# Patient Record
Sex: Male | Born: 1945
Health system: Southern US, Community
[De-identification: ages and names within clinical notes are randomized; demographics above are authoritative.]

## PROBLEM LIST (undated history)

## (undated) DIAGNOSIS — K635 Polyp of colon: Secondary | ICD-10-CM

## (undated) DIAGNOSIS — E785 Hyperlipidemia, unspecified: Secondary | ICD-10-CM

## (undated) DIAGNOSIS — K5790 Diverticulosis of intestine, part unspecified, without perforation or abscess without bleeding: Secondary | ICD-10-CM

## (undated) DIAGNOSIS — Z77090 Contact with and (suspected) exposure to asbestos: Secondary | ICD-10-CM

## (undated) DIAGNOSIS — N200 Calculus of kidney: Secondary | ICD-10-CM

## (undated) DIAGNOSIS — N400 Enlarged prostate without lower urinary tract symptoms: Secondary | ICD-10-CM

## (undated) DIAGNOSIS — I82409 Acute embolism and thrombosis of unspecified deep veins of unspecified lower extremity: Secondary | ICD-10-CM

## (undated) DIAGNOSIS — K7689 Other specified diseases of liver: Secondary | ICD-10-CM

## (undated) DIAGNOSIS — T7840XA Allergy, unspecified, initial encounter: Secondary | ICD-10-CM

## (undated) DIAGNOSIS — K219 Gastro-esophageal reflux disease without esophagitis: Secondary | ICD-10-CM

## (undated) DIAGNOSIS — J342 Deviated nasal septum: Secondary | ICD-10-CM

## (undated) DIAGNOSIS — K529 Noninfective gastroenteritis and colitis, unspecified: Secondary | ICD-10-CM

## (undated) DIAGNOSIS — C4491 Basal cell carcinoma of skin, unspecified: Secondary | ICD-10-CM

## (undated) DIAGNOSIS — C61 Malignant neoplasm of prostate: Principal | ICD-10-CM

## (undated) DIAGNOSIS — D689 Coagulation defect, unspecified: Secondary | ICD-10-CM

## (undated) DIAGNOSIS — D179 Benign lipomatous neoplasm, unspecified: Secondary | ICD-10-CM

## (undated) DIAGNOSIS — C801 Malignant (primary) neoplasm, unspecified: Secondary | ICD-10-CM

## (undated) DIAGNOSIS — K5190 Ulcerative colitis, unspecified, without complications: Secondary | ICD-10-CM

## (undated) HISTORY — DX: Hyperlipidemia, unspecified: E78.5

## (undated) HISTORY — DX: Other specified diseases of liver: K76.89

## (undated) HISTORY — DX: Gastro-esophageal reflux disease without esophagitis: K21.9

## (undated) HISTORY — DX: Malignant neoplasm of prostate: C61

## (undated) HISTORY — DX: Diverticulosis of intestine, part unspecified, without perforation or abscess without bleeding: K57.90

## (undated) HISTORY — PX: APPENDECTOMY: SHX54

## (undated) HISTORY — DX: Benign lipomatous neoplasm, unspecified: D17.9

## (undated) HISTORY — DX: Allergy, unspecified, initial encounter: T78.40XA

## (undated) HISTORY — DX: Polyp of colon: K63.5

## (undated) HISTORY — DX: Noninfective gastroenteritis and colitis, unspecified: K52.9

## (undated) HISTORY — DX: Benign prostatic hyperplasia without lower urinary tract symptoms: N40.0

## (undated) HISTORY — DX: Calculus of kidney: N20.0

## (undated) HISTORY — PX: COLONOSCOPY: SHX174

## (undated) HISTORY — DX: Ulcerative colitis, unspecified, without complications: K51.90

## (undated) HISTORY — DX: Coagulation defect, unspecified: D68.9

## (undated) HISTORY — DX: Malignant (primary) neoplasm, unspecified: C80.1

## (undated) HISTORY — DX: Basal cell carcinoma of skin, unspecified: C44.91

## (undated) HISTORY — PX: TONSILLECTOMY: SUR1361

## (undated) HISTORY — DX: Contact with and (suspected) exposure to asbestos: Z77.090

## (undated) HISTORY — PX: POLYPECTOMY: SHX149

---

## 1989-01-31 HISTORY — PX: OTHER SURGICAL HISTORY: SHX169

## 1989-01-31 HISTORY — PX: INGUINAL HERNIA REPAIR: SUR1180

## 1999-12-01 DIAGNOSIS — M25561 Pain in right knee: Secondary | ICD-10-CM | POA: Insufficient documentation

## 2002-02-16 ENCOUNTER — Encounter: Payer: Self-pay | Admitting: Internal Medicine

## 2004-04-04 ENCOUNTER — Ambulatory Visit: Payer: Self-pay | Admitting: Internal Medicine

## 2004-04-15 ENCOUNTER — Encounter: Payer: Self-pay | Admitting: Internal Medicine

## 2004-04-15 ENCOUNTER — Ambulatory Visit: Payer: Self-pay | Admitting: Internal Medicine

## 2005-06-02 HISTORY — PX: KNEE SURGERY: SHX244

## 2006-03-05 ENCOUNTER — Ambulatory Visit: Payer: Self-pay | Admitting: Internal Medicine

## 2006-03-18 ENCOUNTER — Ambulatory Visit: Payer: Self-pay | Admitting: Internal Medicine

## 2006-05-05 ENCOUNTER — Ambulatory Visit: Payer: Self-pay | Admitting: Internal Medicine

## 2007-03-08 DIAGNOSIS — N400 Enlarged prostate without lower urinary tract symptoms: Secondary | ICD-10-CM | POA: Insufficient documentation

## 2007-03-08 DIAGNOSIS — E782 Mixed hyperlipidemia: Secondary | ICD-10-CM | POA: Insufficient documentation

## 2007-03-08 DIAGNOSIS — E785 Hyperlipidemia, unspecified: Secondary | ICD-10-CM | POA: Insufficient documentation

## 2007-03-30 ENCOUNTER — Ambulatory Visit: Payer: Self-pay | Admitting: Internal Medicine

## 2007-04-08 ENCOUNTER — Ambulatory Visit: Payer: Self-pay | Admitting: Internal Medicine

## 2007-04-09 ENCOUNTER — Ambulatory Visit: Payer: Self-pay | Admitting: Internal Medicine

## 2007-04-09 ENCOUNTER — Encounter: Payer: Self-pay | Admitting: Internal Medicine

## 2007-08-09 ENCOUNTER — Telehealth: Payer: Self-pay | Admitting: Internal Medicine

## 2007-08-23 ENCOUNTER — Ambulatory Visit: Payer: Self-pay | Admitting: Internal Medicine

## 2007-09-27 ENCOUNTER — Ambulatory Visit: Payer: Self-pay | Admitting: Internal Medicine

## 2007-09-27 LAB — CONVERTED CEMR LAB
ALT: 28 units/L (ref 0–53)
AST: 26 units/L (ref 0–37)
Albumin: 4.1 g/dL (ref 3.5–5.2)
Alkaline Phosphatase: 57 units/L (ref 39–117)
BUN: 16 mg/dL (ref 6–23)
Basophils Absolute: 0 10*3/uL (ref 0.0–0.1)
Basophils Relative: 0.1 % (ref 0.0–1.0)
Bilirubin Urine: NEGATIVE
Bilirubin, Direct: 0.1 mg/dL (ref 0.0–0.3)
Blood in Urine, dipstick: NEGATIVE
CO2: 28 meq/L (ref 19–32)
Calcium: 9.3 mg/dL (ref 8.4–10.5)
Chloride: 110 meq/L (ref 96–112)
Cholesterol: 182 mg/dL (ref 0–200)
Creatinine, Ser: 1 mg/dL (ref 0.4–1.5)
Eosinophils Absolute: 0.6 10*3/uL (ref 0.0–0.7)
Eosinophils Relative: 12.9 % — ABNORMAL HIGH (ref 0.0–5.0)
GFR calc Af Amer: 98 mL/min
GFR calc non Af Amer: 81 mL/min
Glucose, Bld: 90 mg/dL (ref 70–99)
Glucose, Urine, Semiquant: NEGATIVE
HCT: 45.1 % (ref 39.0–52.0)
HDL: 30.7 mg/dL — ABNORMAL LOW (ref >39.0)
Hemoglobin: 15 g/dL (ref 13.0–17.0)
Ketones, urine, test strip: NEGATIVE
LDL Cholesterol: 135 mg/dL — ABNORMAL HIGH (ref 0–99)
Lymphocytes Relative: 22.7 % (ref 12.0–46.0)
MCHC: 33.3 g/dL (ref 30.0–36.0)
MCV: 87.4 fL (ref 78.0–100.0)
Monocytes Absolute: 0.4 10*3/uL (ref 0.1–1.0)
Monocytes Relative: 8.1 % (ref 3.0–12.0)
Neutro Abs: 2.6 10*3/uL (ref 1.4–7.7)
Neutrophils Relative %: 56.2 % (ref 43.0–77.0)
Nitrite: NEGATIVE
PSA: 3.04 ng/mL (ref 0.10–4.00)
Platelets: 290 10*3/uL (ref 150–400)
Potassium: 4.3 meq/L (ref 3.5–5.1)
RBC: 5.17 M/uL (ref 4.22–5.81)
RDW: 13.2 % (ref 11.5–14.6)
Sodium: 143 meq/L (ref 135–145)
Specific Gravity, Urine: >=1.03
TSH: 1.97 microintl units/mL (ref 0.35–5.50)
Total Bilirubin: 1.6 mg/dL — ABNORMAL HIGH (ref 0.3–1.2)
Total CHOL/HDL Ratio: 5.9
Total Protein: 6.4 g/dL (ref 6.0–8.3)
Triglycerides: 83 mg/dL (ref 0–149)
Urobilinogen, UA: 0.2
VLDL: 17 mg/dL (ref 0–40)
WBC Urine, dipstick: NEGATIVE
WBC: 4.6 10*3/uL (ref 4.5–10.5)
pH: 5.5

## 2007-10-04 ENCOUNTER — Ambulatory Visit: Payer: Self-pay | Admitting: Internal Medicine

## 2007-10-04 DIAGNOSIS — K219 Gastro-esophageal reflux disease without esophagitis: Secondary | ICD-10-CM | POA: Insufficient documentation

## 2007-10-04 DIAGNOSIS — J309 Allergic rhinitis, unspecified: Secondary | ICD-10-CM

## 2007-10-06 ENCOUNTER — Ambulatory Visit: Payer: Self-pay | Admitting: Internal Medicine

## 2007-10-15 ENCOUNTER — Encounter: Admission: RE | Admit: 2007-10-15 | Discharge: 2007-10-15 | Payer: Self-pay | Admitting: Internal Medicine

## 2007-11-08 ENCOUNTER — Telehealth: Payer: Self-pay | Admitting: Internal Medicine

## 2007-11-23 ENCOUNTER — Ambulatory Visit: Payer: Self-pay | Admitting: Internal Medicine

## 2008-01-17 ENCOUNTER — Telehealth (INDEPENDENT_AMBULATORY_CARE_PROVIDER_SITE_OTHER): Payer: Self-pay | Admitting: *Deleted

## 2008-01-21 ENCOUNTER — Ambulatory Visit: Payer: Self-pay | Admitting: Internal Medicine

## 2008-01-21 LAB — CONVERTED CEMR LAB
BUN: 16 mg/dL (ref 6–23)
Creatinine, Ser: 1 mg/dL (ref 0.4–1.5)

## 2008-01-24 ENCOUNTER — Ambulatory Visit: Payer: Self-pay | Admitting: Cardiovascular Disease

## 2008-03-15 ENCOUNTER — Ambulatory Visit: Payer: Self-pay | Admitting: Internal Medicine

## 2008-03-25 ENCOUNTER — Ambulatory Visit: Payer: Self-pay | Admitting: Family Medicine

## 2008-03-25 LAB — CONVERTED CEMR LAB
Bilirubin Urine: NEGATIVE
Blood in Urine, dipstick: NEGATIVE
Glucose, Urine, Semiquant: NEGATIVE
Ketones, urine, test strip: NEGATIVE
Nitrite: NEGATIVE
Protein, U semiquant: NEGATIVE
Specific Gravity, Urine: 1.01
Urobilinogen, UA: 0.2
WBC Urine, dipstick: NEGATIVE
pH: 6.5

## 2008-10-19 ENCOUNTER — Telehealth: Payer: Self-pay | Admitting: Internal Medicine

## 2009-01-22 ENCOUNTER — Telehealth: Payer: Self-pay | Admitting: Internal Medicine

## 2009-02-15 ENCOUNTER — Ambulatory Visit: Payer: Self-pay | Admitting: Internal Medicine

## 2009-02-15 LAB — CONVERTED CEMR LAB
Bilirubin Urine: NEGATIVE
Blood in Urine, dipstick: NEGATIVE
Glucose, Urine, Semiquant: NEGATIVE
Ketones, urine, test strip: NEGATIVE
Nitrite: NEGATIVE
Protein, U semiquant: NEGATIVE
Specific Gravity, Urine: 1.025
Urobilinogen, UA: 0.2
WBC Urine, dipstick: NEGATIVE
pH: 5.5

## 2009-02-18 LAB — CONVERTED CEMR LAB
ALT: 28 units/L (ref 0–53)
AST: 21 units/L (ref 0–37)
Albumin: 4.1 g/dL (ref 3.5–5.2)
Alkaline Phosphatase: 63 units/L (ref 39–117)
BUN: 16 mg/dL (ref 6–23)
Basophils Absolute: 0 10*3/uL (ref 0.0–0.1)
Basophils Relative: 0.5 % (ref 0.0–3.0)
Bilirubin, Direct: 0.2 mg/dL (ref 0.0–0.3)
CO2: 27 meq/L (ref 19–32)
Calcium: 8.9 mg/dL (ref 8.4–10.5)
Chloride: 107 meq/L (ref 96–112)
Cholesterol: 186 mg/dL (ref 0–200)
Creatinine, Ser: 1 mg/dL (ref 0.4–1.5)
Eosinophils Absolute: 0.6 10*3/uL (ref 0.0–0.7)
Eosinophils Relative: 11.5 % — ABNORMAL HIGH (ref 0.0–5.0)
GFR calc non Af Amer: 80.57 mL/min (ref >60)
Glucose, Bld: 111 mg/dL — ABNORMAL HIGH (ref 70–99)
HCT: 44.2 % (ref 39.0–52.0)
HDL: 34.7 mg/dL — ABNORMAL LOW (ref >39.00)
Hemoglobin: 15.3 g/dL (ref 13.0–17.0)
LDL Cholesterol: 135 mg/dL — ABNORMAL HIGH (ref 0–99)
Lymphocytes Relative: 24.4 % (ref 12.0–46.0)
Lymphs Abs: 1.2 10*3/uL (ref 0.7–4.0)
MCHC: 34.6 g/dL (ref 30.0–36.0)
MCV: 88.8 fL (ref 78.0–100.0)
Monocytes Absolute: 0.4 10*3/uL (ref 0.1–1.0)
Monocytes Relative: 8.4 % (ref 3.0–12.0)
Neutro Abs: 2.9 10*3/uL (ref 1.4–7.7)
Neutrophils Relative %: 55.2 % (ref 43.0–77.0)
PSA: 4.22 ng/mL — ABNORMAL HIGH (ref 0.10–4.00)
Platelets: 236 10*3/uL (ref 150.0–400.0)
Potassium: 4.2 meq/L (ref 3.5–5.1)
RBC: 4.98 M/uL (ref 4.22–5.81)
RDW: 12.9 % (ref 11.5–14.6)
Sodium: 140 meq/L (ref 135–145)
TSH: 2.02 microintl units/mL (ref 0.35–5.50)
Total Bilirubin: 1.8 mg/dL — ABNORMAL HIGH (ref 0.3–1.2)
Total CHOL/HDL Ratio: 5
Total Protein: 6.5 g/dL (ref 6.0–8.3)
Triglycerides: 80 mg/dL (ref 0.0–149.0)
VLDL: 16 mg/dL (ref 0.0–40.0)
WBC: 5.1 10*3/uL (ref 4.5–10.5)

## 2009-02-22 ENCOUNTER — Ambulatory Visit: Payer: Self-pay | Admitting: Internal Medicine

## 2009-04-02 ENCOUNTER — Ambulatory Visit: Payer: Self-pay | Admitting: Internal Medicine

## 2009-04-02 LAB — CONVERTED CEMR LAB
Basophils Absolute: 0 10*3/uL (ref 0.0–0.1)
Basophils Relative: 0.7 % (ref 0.0–3.0)
Eosinophils Absolute: 0.7 10*3/uL (ref 0.0–0.7)
Eosinophils Relative: 13.2 % — ABNORMAL HIGH (ref 0.0–5.0)
HCT: 43.8 % (ref 39.0–52.0)
Hemoglobin: 15.3 g/dL (ref 13.0–17.0)
Lymphocytes Relative: 23.9 % (ref 12.0–46.0)
Lymphs Abs: 1.2 10*3/uL (ref 0.7–4.0)
MCHC: 35 g/dL (ref 30.0–36.0)
MCV: 91.4 fL (ref 78.0–100.0)
Monocytes Absolute: 0.4 10*3/uL (ref 0.1–1.0)
Monocytes Relative: 7.9 % (ref 3.0–12.0)
Neutro Abs: 2.9 10*3/uL (ref 1.4–7.7)
Neutrophils Relative %: 54.3 % (ref 43.0–77.0)
PSA, Free Pct: 12 — ABNORMAL LOW (ref >25)
PSA, Free: 0.4 ng/mL
PSA: 3.41 ng/mL (ref 0.10–4.00)
Platelets: 237 10*3/uL (ref 150.0–400.0)
RBC: 4.79 M/uL (ref 4.22–5.81)
RDW: 12.7 % (ref 11.5–14.6)
WBC: 5.2 10*3/uL (ref 4.5–10.5)

## 2009-11-28 ENCOUNTER — Telehealth: Payer: Self-pay | Admitting: Internal Medicine

## 2010-02-18 ENCOUNTER — Ambulatory Visit: Payer: Self-pay | Admitting: Internal Medicine

## 2010-02-18 LAB — CONVERTED CEMR LAB
ALT: 28 units/L (ref 0–53)
AST: 22 units/L (ref 0–37)
Albumin: 4 g/dL (ref 3.5–5.2)
Alkaline Phosphatase: 58 units/L (ref 39–117)
BUN: 16 mg/dL (ref 6–23)
Basophils Absolute: 0 10*3/uL (ref 0.0–0.1)
Basophils Relative: 0.6 % (ref 0.0–3.0)
Bilirubin Urine: NEGATIVE
Bilirubin, Direct: 0.1 mg/dL (ref 0.0–0.3)
CO2: 25 meq/L (ref 19–32)
Calcium: 9 mg/dL (ref 8.4–10.5)
Cholesterol: 195 mg/dL (ref 0–200)
Creatinine, Ser: 0.9 mg/dL (ref 0.4–1.5)
Eosinophils Absolute: 0.6 10*3/uL (ref 0.0–0.7)
Eosinophils Relative: 10.7 % — ABNORMAL HIGH (ref 0.0–5.0)
Glucose, Bld: 100 mg/dL — ABNORMAL HIGH (ref 70–99)
Glucose, Urine, Semiquant: NEGATIVE
HCT: 44.1 % (ref 39.0–52.0)
HDL: 38.3 mg/dL — ABNORMAL LOW (ref >39.00)
Hemoglobin: 15.2 g/dL (ref 13.0–17.0)
Ketones, urine, test strip: NEGATIVE
LDL Cholesterol: 140 mg/dL — ABNORMAL HIGH (ref 0–99)
Lymphocytes Relative: 26 % (ref 12.0–46.0)
Lymphs Abs: 1.3 10*3/uL (ref 0.7–4.0)
MCHC: 34.5 g/dL (ref 30.0–36.0)
MCV: 90.7 fL (ref 78.0–100.0)
Monocytes Absolute: 0.5 10*3/uL (ref 0.1–1.0)
Monocytes Relative: 9.3 % (ref 3.0–12.0)
Neutro Abs: 2.8 10*3/uL (ref 1.4–7.7)
Neutrophils Relative %: 53.4 % (ref 43.0–77.0)
Nitrite: NEGATIVE
PSA: 4.16 ng/mL — ABNORMAL HIGH (ref 0.10–4.00)
Platelets: 257 10*3/uL (ref 150.0–400.0)
Potassium: 4.2 meq/L (ref 3.5–5.1)
Protein, U semiquant: NEGATIVE
RBC: 4.86 M/uL (ref 4.22–5.81)
RDW: 13.3 % (ref 11.5–14.6)
Sodium: 139 meq/L (ref 135–145)
Specific Gravity, Urine: 1.02
TSH: 1.87 microintl units/mL (ref 0.35–5.50)
Total Bilirubin: 1.2 mg/dL (ref 0.3–1.2)
Total CHOL/HDL Ratio: 5
Total Protein: 6 g/dL (ref 6.0–8.3)
Triglycerides: 85 mg/dL (ref 0.0–149.0)
Urobilinogen, UA: 0.2
WBC: 5.2 10*3/uL (ref 4.5–10.5)

## 2010-02-25 ENCOUNTER — Encounter: Payer: Self-pay | Admitting: Internal Medicine

## 2010-02-25 ENCOUNTER — Ambulatory Visit: Payer: Self-pay | Admitting: Internal Medicine

## 2010-03-02 DIAGNOSIS — K7689 Other specified diseases of liver: Secondary | ICD-10-CM

## 2010-03-02 HISTORY — DX: Other specified diseases of liver: K76.89

## 2010-03-11 ENCOUNTER — Ambulatory Visit: Payer: Self-pay | Admitting: Radiology

## 2010-03-11 ENCOUNTER — Emergency Department (HOSPITAL_BASED_OUTPATIENT_CLINIC_OR_DEPARTMENT_OTHER): Admission: EM | Admit: 2010-03-11 | Discharge: 2010-03-11 | Payer: Self-pay | Admitting: Emergency Medicine

## 2010-03-18 ENCOUNTER — Telehealth: Payer: Self-pay | Admitting: Internal Medicine

## 2010-03-19 ENCOUNTER — Encounter: Payer: Self-pay | Admitting: Internal Medicine

## 2010-03-21 ENCOUNTER — Ambulatory Visit: Payer: Self-pay

## 2010-03-21 ENCOUNTER — Telehealth (INDEPENDENT_AMBULATORY_CARE_PROVIDER_SITE_OTHER): Payer: Self-pay | Admitting: *Deleted

## 2010-03-21 ENCOUNTER — Ambulatory Visit: Payer: Self-pay | Admitting: Family Medicine

## 2010-03-21 DIAGNOSIS — M722 Plantar fascial fibromatosis: Secondary | ICD-10-CM

## 2010-03-21 DIAGNOSIS — M79609 Pain in unspecified limb: Secondary | ICD-10-CM

## 2010-03-25 ENCOUNTER — Encounter: Payer: Self-pay | Admitting: Internal Medicine

## 2010-04-04 ENCOUNTER — Encounter: Payer: Self-pay | Admitting: Internal Medicine

## 2010-05-09 ENCOUNTER — Encounter: Payer: Self-pay | Admitting: Internal Medicine

## 2010-05-20 ENCOUNTER — Ambulatory Visit: Payer: Self-pay | Admitting: Internal Medicine

## 2010-06-02 DIAGNOSIS — N200 Calculus of kidney: Secondary | ICD-10-CM | POA: Insufficient documentation

## 2010-06-12 ENCOUNTER — Ambulatory Visit
Admission: RE | Admit: 2010-06-12 | Discharge: 2010-06-12 | Payer: Self-pay | Source: Home / Self Care | Attending: Internal Medicine | Admitting: Internal Medicine

## 2010-06-25 ENCOUNTER — Telehealth: Payer: Self-pay | Admitting: Internal Medicine

## 2010-07-02 NOTE — Letter (Signed)
Summary: [facility]  [facility]   Imported By: Maryln Gottron 04/01/2010 11:18:35  _____________________________________________________________________  External Attachment:    Type:   Image     Comment:   External Document

## 2010-07-02 NOTE — Assessment & Plan Note (Signed)
Summary: r/o DVT/dm   Vital Signs:  Patient profile:   65 year old male Height:      71 inches (180.34 cm) Weight:      243 pounds (110.45 kg) O2 Sat:      97 % on Room air Temp:     97.8 degrees F (36.56 degrees C) oral Pulse rate:   84 / minute BP sitting:   126 / 76  (left arm) Cuff size:   large  Vitals Entered By: [name] RMA (March 21, 2010 10:24 AM)  O2 Flow:  Room air CC: Feet swelling X10-12 days- a lillte swelling in both feet but left is worse/ pain in left leg x10-12 days/ CF Is Patient Diabetic? No   CC:  Feet swelling X10-12 days- a lillte swelling in both feet but left is worse/ pain in left leg x10-12 days/ CF.  History of Present Illness: Patient is a 65 yo male, nonsmoker who is in today with concerns of pain, he is experiencing edema and pain in left leg. He denies any redness or swelling in that leg. The pain in the leg is only evident with ambulation, pain free at rest. He also experiencing left heal pain as well. He notes this pain is also not present at rest only while ambulating. Denies any injury or fall. Has been exceedingly sedentary the past several weeks because he has been in increased abdominal pain secondary to a kidney stone he has been having trouble passing. He denies f/c/malasie/CP/palp/SOB/GI c/o. Notes the LLE swelling started about the same time he stopped moving. shortly there after his heel started to hurt and then his posterior knee and posterior upper calf  Preventive Screening-Counseling & Management  Alcohol-Tobacco     Smoking Status: never  Current Medications (verified): 1)  Doxazosin Mesylate 4 Mg  Tabs (Doxazosin Mesylate) .... 1/2/ Daily 2)  Icaps Mv   Tabs (Multiple Vitamins-Minerals) .... Once Daily 3)  Omeprazole 20 Mg Cpdr (Omeprazole) .... One By Mouth Every Other Day 4)  Dulcolax .... As Needed 5)  Tamsulosin Hcl 0.4 Mg Caps (Tamsulosin Hcl) .... Once Daily 6)  Oxycodone-Acetaminophen 10-650 Mg Tabs  (Oxycodone-Acetaminophen) .... 1/2-1 1/2 Q 6hrs As Needed  Allergies (verified): No Known Drug Allergies  Past History:  Past medical history reviewed for relevance to current acute and chronic problems. Social history (including risk factors) reviewed for relevance to current acute and chronic problems.  Past Medical History: Reviewed history from 10/04/2007 and no changes required. Hyperlipidemia Benign prostatic hypertrophy Remote asbestos exposure (1980) Allergic rhinitis GERD  Social History: Reviewed history from 10/04/2007 and no changes required. 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 No apparent alopecia or balding. Mouth:  Oral mucosa and oropharynx without lesions or exudates.  Teeth in good repair. Neck:  No deformities, masses, or tenderness noted. Lungs:  Normal respiratory effort, chest expands symmetrically. Lungs are clear to auscultation, no crackles or wheezes. Heart:  Normal rate and regular rhythm. S1 and S2 normal without gallop, murmur, click, rub or other extra sounds. Abdomen:  Bowel sounds positive,abdomen soft and non-tender without masses, organomegaly or hernias noted. Msk:  slight tightness behind left knee, no warmth or redness, no joint line tenderness or ligamentous laxity over the knee. , negative Homan's sign b/l . No  calf tenderness or swelling. Extremities:  No clubbing, cyanosis, edema, or deformity noted   Psych:  Cognition and judgment appear intact. Alert and cooperative with normal attention span and concentration. No apparent delusions, illusions, hallucinations   Impression & Recommendations:  Problem # 1:  CALF PAIN, LEFT (ICD-729.5)  Orders: Doppler Referral  (Doppler) Try daily stretching, Meloxicam and heat if no improvement notify office  Problem # 2:  PLANTAR FASCIITIS, LEFT (ICD-728.71)  His updated medication list for this problem includes:    Meloxicam 7.5 Mg Tabs (Meloxicam) .Marland Kitchen... 1 tab by mouth q am as needed pain with food, would take one daily for 2 weeks and then as needed Daily stretching, ice the heel and apply Aspercreme as needed, for the edema, elevate feet, avoid sodium and consider compression hose  Complete Medication List: 1)  Doxazosin Mesylate 4 Mg Tabs (Doxazosin mesylate) .... 1/2/ daily 2)  Icaps Mv Tabs (Multiple vitamins-minerals) .... Once daily 3)  Omeprazole 20 Mg Cpdr (Omeprazole) .... One by mouth every other day 4)  Dulcolax  .... As needed 5)  Tamsulosin Hcl 0.4 Mg Caps (Tamsulosin hcl) .... Once daily 6)  Oxycodone-acetaminophen 10-650 Mg Tabs (Oxycodone-acetaminophen) .... 1/2-1 1/2 q 6hrs as needed 7)  Meloxicam 7.5 Mg Tabs (Meloxicam) .Marland Kitchen.. 1 tab by mouth q am as needed pain with food, would take one daily for 2 weeks and then as needed  Patient Instructions: 1)  You may move around but avoid painful motions. Apply ice to sore area for 20 minutes 3-4 times a day for 2-3 days.  2)  Please schedule a follow-up appointment as needed if symptoms worsen. 3)  try elevating the feet above the heart two times a day x 15 minutes. 4)  Limit your Sodium(salt) .  5)  increase activity once we have ruled out a blood clot. 6)  Consider Compression stockings to keep down swelling if it is recurrent. For the heel pain, try stretching daily as directed and ice the heel two times a day x 15 minutes then apply Aspercreme topically Prescriptions: MELOXICAM 7.5 MG TABS (MELOXICAM) 1 tab by mouth q am as needed pain with food, would take one daily for 2 weeks and then as needed  #30 x 1   Entered and Authorized by:   Danise Edge MD   Signed by:   Danise Edge MD on 03/21/2010   Method used:   Electronically to        [facility]  [phone]* (retail)       [address]       [place], [place]  [address]       Ph: [phone] or [phone]       Fax: [phone]   RxID:   [id number]    Orders Added: 1)  Doppler Referral [Doppler] 2)  Est. Patient Level III [38756]

## 2010-07-02 NOTE — Miscellaneous (Signed)
Summary: Orders Update  Clinical Lists Changes  Orders: Added new Test order of Venous Duplex Lower Extremity (Venous Duplex Lower) - Signed 

## 2010-07-02 NOTE — Progress Notes (Signed)
Summary: [facility] called. Pt neg for DVT  Phone Note From Other Clinic   Caller: [facility]  Summary of Call: Pt is negative for DVT.  Just wanted to make Dr. Rogelia Rohrer aware.  Initial call taken by: Lucy Antigua,  March 21, 2010 1:27 PM  Follow-up for Phone Call        notify neg Follow-up by: Danise Edge MD,  March 21, 2010 7:16 PM  Additional Follow-up for Phone Call Additional follow up Details #1::        Left message with spouse to have pt return my call. Additional Follow-up by: [name] RMA,  March 22, 2010 9:13 AM     Appended Document: [facility] called. Pt neg for DVT patient informed.

## 2010-07-02 NOTE — Letter (Signed)
Summary: [facility]  [facility]   Imported By: Maryln Gottron 04/12/2010 14:51:06  _____________________________________________________________________  External Attachment:    Type:   Image     Comment:   External Document

## 2010-07-02 NOTE — Assessment & Plan Note (Signed)
Summary: cpx/cjr   Vital Signs:  Patient profile:   65 year old male Height:      71 inches Weight:      240 pounds BMI:     33.59 Temp:     97.3 degrees F oral Pulse rate:   78 / minute Pulse rhythm:   regular Resp:     12 per minute BP sitting:   110 / 80  Vitals Entered By: Lynann Beaver CMA (February 25, 2010 10:03 AM) CC: cpx Is Patient Diabetic? No Pain Assessment Patient in pain? no        CC:  cpx.  History of Present Illness: cpx  Current Problems (verified): 1)  Gerd  (ICD-530.81) 2)  Allergic Rhinitis  (ICD-477.9) 3)  Physical Examination  (ICD-V70.0) 4)  Benign Prostatic Hypertrophy  (ICD-600.00) 5)  Family History of Colon Ca 1st Degree Relative <60  (ICD-V16.0) 6)  Hyperlipidemia  (ICD-272.4)  Current Medications (verified): 1)  Doxazosin Mesylate 4 Mg  Tabs (Doxazosin Mesylate) .... 1/2/ Daily 2)  Icaps Mv   Tabs (Multiple Vitamins-Minerals) .... Once Daily 3)  Omeprazole 20 Mg Cpdr (Omeprazole) .... 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  alert and well-developed.   Head:  normocephalic and atraumatic.  normocephalic and atraumatic.   Eyes:  pupils equal and pupils round.  pupils equal and pupils round.   Ears:  R ear normal  and L ear normal.  R ear normal and L ear normal.   Neck:  No deformities, masses, or tenderness noted. Chest Wall:  No deformities, masses, tenderness or gynecomastia noted. Lungs:  Normal respiratory effort, chest expands symmetrically. Lungs are clear to auscultation, no crackles or wheezes. Heart:  normal rate and regular rhythm.  normal rate and regular rhythm.   Abdomen:  soft and non-tender.  soft and non-tender.   Prostate:  no nodules and no asymmetry.  no nodules and no asymmetry.   Msk:  No deformity or scoliosis noted of thoracic or lumbar spine.   Neurologic:  cranial nerves II-XII intact and gait normal.  cranial nerves II-XII intact and gait normal.   Skin:  turgor normal and color normal.  turgor normal and color normal.   Psych:  normally interactive and good eye contact.  normally interactive and good eye contact.     Impression & Recommendations:  Problem # 1:  PHYSICAL EXAMINATION (ICD-V70.0)  health maint UTD advised weight loss low calorie diet and daily exercise  Orders: EKG w/ Interpretation (93000)  Problem # 2:  HYPERLIPIDEMIA (ICD-272.4) reasonable control---will do better with exercise and weight loss  Problem # 3:  BENIGN PROSTATIC HYPERTROPHY (ICD-600.00) note PSA will monitor in 3 months His updated medication list for this problem includes:    Doxazosin Mesylate 4 Mg Tabs (Doxazosin mesylate) .Marland Kitchen... 1/2/ daily  Complete Medication List: 1)  Doxazosin Mesylate 4 Mg Tabs (Doxazosin mesylate) .... 1/2/ daily 2)  Icaps Mv Tabs (Multiple vitamins-minerals) .Marland KitchenMarland KitchenMarland Kitchen  Once daily 3)  Omeprazole 20 Mg Cpdr (Omeprazole) .... One by mouth daily  Patient Instructions: 1)  3 months---labs only 2)  PSA with free PSA

## 2010-07-02 NOTE — Progress Notes (Signed)
Summary: Pt req to have psa lvl re-checked  Phone Note Call from Patient Call back at [facility] Phone [phone]   Caller: Patient Summary of Call: Pt called and is req to have psa lvl recheck. Would like to order labs.      Initial call taken by: Lucy Antigua,  November 28, 2009 11:53 AM  Follow-up for Phone Call        appears he will be due in september Follow-up by: Birdie Sons MD,  December 01, 2009 6:29 PM  Additional Follow-up for Phone Call Additional follow up Details #1::        I called pt and notified him that he is sch to come in for cpx labs on 02/18/10 and cpx on 02/25/10, as noted above. Pt said that he will just wait until then.  Additional Follow-up by: Lucy Antigua,  December 05, 2009 10:24 AM

## 2010-07-02 NOTE — Assessment & Plan Note (Signed)
Summary: reck med/jls   Vital Signs:  Patient Profile:   65 Years Old Male Weight:      236 pounds Temp:     97.9 degrees F Pulse rate:   66 / minute BP sitting:   122 / 90  (left arm)  Vitals Entered By: Gladis Riffle, RN (August 23, 2007 3:49 PM)                 Chief Complaint:  wants to discuss flomax and would like cheaper alternative--also states needs to schedule cpx.  History of Present Illness: flomax costs too much---works well    Current Allergies (reviewed today): No known allergies         Impression & Recommendations:  Problem # 1:  BENIGN PROSTATIC HYPERTROPHY (ICD-600.00) flomax works well--nocturia from 5 times nightly to 1-2  too expensive  20 minutes total time discussed at length.  Complete Medication List: 1)  Doxazosin Mesylate 4 Mg Tabs (Doxazosin mesylate) .Marland Kitchen.. 1 by mouth daily 2)  Adult Aspirin Low Strength 81 Mg Tbdp (Aspirin) .... Once daily 3)  Cvs Niacin 100 Mg Tabs (Niacin) .... Once daily 4)  Multivitamins Tabs (Multiple vitamin) .... Once daily 5)  Vitamin C 500 Mg Tabs (Ascorbic acid) .... Once daily 6)  Zinc 15 66 Mg Tabs (Zinc sulfate) .... Once daily 7)  Chromium 100 Mcg Tabs (Chromium) .... Once daily 8)  Icaps Mv Tabs (Multiple vitamins-minerals) .... Once daily 9)  Saw Palmetto 160 Mg Caps (Saw palmetto (serenoa repens)) .... Once daily 10)  Calcium 600 1500 Mg Tabs (Calcium carbonate) .... Once daily 11)  Glucosamine-chondroitin 1500-1200 Mg/9ml Liqd (Glucosamine-chondroitin) .... Once daily   Patient Instructions: 1)  Schedule CPX 2)  doxazosin 1/2 tablet nightly for 1 week and then 1 tablet nightly    Prescriptions: DOXAZOSIN MESYLATE 4 MG  TABS (DOXAZOSIN MESYLATE) 1 by mouth daily  #90 x 3   Entered and Authorized by:   Birdie Sons MD   Signed by:   Birdie Sons MD on 08/23/2007   Method used:   Electronically sent to ...       [facility]  [address]  [phone]*       [address]       [place], [place]  [address]       Ph: [phone] or [phone]       Fax: [phone]   RxID:   [id number]  ]

## 2010-07-02 NOTE — Progress Notes (Signed)
  Phone Note Call from Patient Call back at Home Phone [phone]   Caller: Patient Call For: Birdie Sons MD Summary of Call: Diagnosed last Monday with kidney stones, and is having bilateral feet swelling, Takes Flomax, Percocet and Ibuprofen???  any suggestions?  Initial call taken by: Lynann Beaver CMA,  March 18, 2010 10:53 AM  Follow-up for Phone Call        stop ibuprofen. see me for feet swelling if sxs persist Follow-up by: Birdie Sons MD,  March 18, 2010 2:35 PM  Additional Follow-up for Phone Call Additional follow up Details #1::        Phone Call Completed Additional Follow-up by: Rudy Jew, RN,  March 18, 2010 2:44 PM     Appended Document:  Pt is better today and does not need appt right now.

## 2010-07-02 NOTE — Assessment & Plan Note (Signed)
Summary: POSS BLADDER INF/LD   Vital Signs:  Patient Profile:   65 Years Old Male Height:     72 inches Weight:      238.75 pounds Temp:     97.2 degrees F oral Pulse rate:   72 / minute BP sitting:   118 / 76  (left arm) Cuff size:   large  Vitals Entered By: Shonna Chock (March 25, 2008 11:12 AM)                 Chief Complaint:  POSSIBLE BLADDER INFECTION.  History of Present Illness: 65 yo man who developed dysuria and frequency last night.  Awoke 4x last night to use the bathroom- usually only 2x/night.  Has had 2-3 UTIs in last 8 years.  Denies hematuria.  This AM has no sxs.  Denies suprapubic pain, hesitancy, urgency, incontinence.  Concerned b/c he leaves for vacation tomorrow.    Current Allergies (reviewed today): No known allergies      Review of Systems      See HPI   Physical Exam  General:     Well-developed,well-nourished,in no acute distress; alert,appropriate and cooperative throughout examination Abdomen:     Bowel sounds positive,abdomen soft and non-tender without masses    Impression & Recommendations:  Problem # 1:  DYSURIA (ICD-788.1) Assessment: New Pt's urinary sxs have spontaneously resolved.  UA WNL, no evidence of infxn.  Pt given option of taking prescription with him on vacation in case sxs return or calling PCP if needed- pt elected to call if needed. Orders: UA Dipstick w/o Micro (manual) (40981)   Complete Medication List: 1)  Doxazosin Mesylate 4 Mg Tabs (Doxazosin mesylate) .Marland Kitchen.. 1 by mouth daily 2)  Adult Aspirin Low Strength 81 Mg Tbdp (Aspirin) .... Once daily 3)  Cvs Niacin 100 Mg Tabs (Niacin) .... Once daily 4)  Multivitamins Tabs (Multiple vitamin) .... Once daily 5)  Vitamin C 500 Mg Tabs (Ascorbic acid) .... Once daily 6)  Zinc 15 66 Mg Tabs (Zinc sulfate) .... Once daily 7)  Chromium 100 Mcg Tabs (Chromium) .... Once daily 8)  Icaps Mv Tabs (Multiple vitamins-minerals) .... Once daily 9)  Saw Palmetto 160  Mg Caps (Saw palmetto (serenoa repens)) .... Once daily 10)  Caltrate 600+d 600-400 Mg-unit Tabs (Calcium carbonate-vitamin d) .... Once daily 11)  Glucosamine-chondroitin 1500-1200 Mg/66ml Liqd (Glucosamine-chondroitin) .... Once daily 12)  Afrin Allergy 0.5 % Soln (Phenylephrine hcl) .... Every hs 13)  Potassium Chloride Cr 10 Meq Cpcr (Potassium chloride) .... Once daily 14)  Omeprazole 20 Mg Cpdr (Omeprazole) .... One by mouth daily   Patient Instructions: 1)  If you develop symptoms while on vacation, please call your doctor to discuss a possible antibiotic 2)  Your urine is clear today- not at all suspicious of infection 3)  Enjoy your trip   ] Laboratory Results   Urine Tests    Routine Urinalysis   Color: straw Appearance: Clear Glucose: negative   (Normal Range: Negative) Bilirubin: negative   (Normal Range: Negative) Ketone: negative   (Normal Range: Negative) Spec. Gravity: 1.010   (Normal Range: 1.003-1.035) Blood: negative   (Normal Range: Negative) pH: 6.5   (Normal Range: 5.0-8.0) Protein: negative   (Normal Range: Negative) Urobilinogen: 0.2   (Normal Range: 0-1) Nitrite: negative   (Normal Range: Negative) Leukocyte Esterace: negative   (Normal Range: Negative)

## 2010-07-02 NOTE — Letter (Signed)
Summary: [facility]  [facility]   Imported By: Maryln Gottron 04/12/2010 09:36:23  _____________________________________________________________________  External Attachment:    Type:   Image     Comment:   External Document

## 2010-07-02 NOTE — Progress Notes (Signed)
Summary: REFILL omeprazole  Phone Note Call from Patient Call back at Home Phone [phone]   Caller: Patient-LIVE CALL Reason for Call: Refill Medication Summary of Call: RF OMEPRAZOLE 20MG  AT [facility]. Initial call taken by: Warnell Forester,  January 22, 2009 11:49 AM  Follow-up for Phone Call        was done in 5/10 for #90 x3 so should havbe refills at [facility].  Pt wife notified to call them to refill and they will let us know if not currentRx Follow-up by: Gladis Riffle, RN,  January 22, 2009 4:10 PM

## 2010-07-02 NOTE — Assessment & Plan Note (Signed)
Summary: cpx/jls  Medications Added CALTRATE 600+D 600-400 MG-UNIT  TABS (CALCIUM CARBONATE-VITAMIN D) once daily LORATADINE 10 MG  TABS (LORATADINE) bid AFRIN ALLERGY 0.5 %  SOLN (PHENYLEPHRINE HCL) every hs POTASSIUM CHLORIDE CR 10 MEQ  CPCR (POTASSIUM CHLORIDE) once daily ZEGERID 40-1100 MG CAPS (OMEPRAZOLE-SODIUM BICARBONATE) 1 by mouth daily FLUTICASONE PROPIONATE 50 MCG/ACT  SUSP (FLUTICASONE PROPIONATE) 2 sprays each nostril once daily        Vital Signs:  Patient Profile:   65 Years Old Male Height:     72 inches Weight:      241 pounds Pulse rate:   64 / minute BP sitting:   124 / 94  (left arm)  Vitals Entered By: Gladis Riffle, RN (Oct 04, 2007 8:48 AM)                 Chief Complaint:  cpx, labs done--c/0 aleergy flare--taking otc anithistasmine and qd afrin--also c/o GERD x 10 years, and getting progressively worse so is qod at present--also request cxr due to heavy congestion and cough every AM.  History of Present Illness: cpx  BPH---much better on doxazosin   New complaints: GERD---bi monthly for 10 years---worse recently--more frequent COUGH--usually every morning---cough is non productive Rhinitis---uses AFRIN every day "during allergy season"      Current Allergies (reviewed today): No known allergies   Past Medical History:    Reviewed history from 03/08/2007 and no changes required:       Hyperlipidemia       Benign prostatic hypertrophy       Remote asbestos exposure (1980)       Allergic rhinitis       GERD  Past Surgical History:    Reviewed history from 03/08/2007 and no changes required:       R Knee Arthroscopy       Colonoscopy       Inguinal herniorrhaphy       Tonsillectomy   Family History:    Reviewed history from 03/08/2007 and no changes required:       Family History of Colon CA 1st degree relative --mother and father       Family History Hypertension  Social History:    Reviewed history from 03/08/2007 and no  changes required:       Retired       Married       Never Smoked       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  Otoscopic examination reveals clear canals, tympanic membranes are intact bilaterally without bulging, retraction, inflammation or discharge. Hearing is grossly normal bilaterally. Nose:     External nasal examination shows no deformity or inflammation. Nasal mucosa are pink and moist without lesions or exudates. Neck:     No deformities, masses, or tenderness noted. Chest Wall:     No deformities, masses, tenderness or gynecomastia noted. Lungs:     Normal respiratory effort, chest expands symmetrically. Lungs are clear to auscultation, no crackles or wheezes.  Abdomen:     Bowel sounds positive,abdomen soft and non-tender without masses, organomegaly or hernias noted.    Complete Medication List: 1)  Doxazosin Mesylate 4 Mg Tabs (Doxazosin mesylate) .Marland Kitchen.. 1 by mouth daily 2)  Adult Aspirin Low Strength 81 Mg Tbdp (Aspirin) .... Once daily 3)  Cvs Niacin 100 Mg Tabs (Niacin) .... Once daily 4)  Multivitamins Tabs (Multiple vitamin) .... Once daily 5)  Vitamin C 500 Mg Tabs (Ascorbic acid) .... Once daily 6)  Zinc 15 66 Mg Tabs (Zinc sulfate) .... Once daily 7)  Chromium 100 Mcg Tabs (Chromium) .... Once daily 8)  Icaps Mv Tabs (Multiple vitamins-minerals) .... Once daily 9)  Saw Palmetto 160 Mg Caps (Saw palmetto (serenoa repens)) .... Once daily 10)  Caltrate 600+d 600-400 Mg-unit Tabs (Calcium carbonate-vitamin d) .... Once daily 11)  Glucosamine-chondroitin 1500-1200 Mg/37ml Liqd  (Glucosamine-chondroitin) .... Once daily 12)  Loratadine 10 Mg Tabs (Loratadine) .... Bid 13)  Afrin Allergy 0.5 % Soln (Phenylephrine hcl) .... Every hs 14)  Potassium Chloride Cr 10 Meq Cpcr (Potassium chloride) .... Once daily 15)  Zegerid 40-1100 Mg Caps (Omeprazole-sodium bicarbonate) .Marland Kitchen.. 1 by mouth daily 16)  Fluticasone Propionate 50 Mcg/act Susp (Fluticasone propionate) .... 2 sprays each nostril once daily   Patient Instructions: 1)  6 weeks    Prescriptions: FLUTICASONE PROPIONATE 50 MCG/ACT  SUSP (FLUTICASONE PROPIONATE) 2 sprays each nostril once daily  #1 vial x 3   Entered and Authorized by:   Birdie Sons MD   Signed by:   Birdie Sons MD on 10/04/2007   Method used:   Electronically sent to ...       [facility]  [address]  [phone]*       [address]       [place], [place]  [address]       Ph: [phone] or [phone]       Fax: [phone]   RxID:   [id number]  ]  Preventive Care Screening  Colonoscopy:    Date:  04/02/2004    Next Due:  04/2011    Results:  colon polyps   Physical Exam General Appearance: well developed, well nourished, no acute distress Eyes: conjunctiva and lids normal, PERRL, EOMI, fundi WNL Ears, Nose, Mouth, Throat: TM clear, nares clear, oral exam WNL Neck: supple, no lymphadenopathy, no thyromegaly, no JVD Respiratory: clear to auscultation and percussion, respiratory effort normal Cardiovascular: regular rate and rhythm, S1-S2, no murmur, rub or gallop, no bruits, peripheral pulses normal and symmetric, no cyanosis, clubbing, edema or varicosities Chest: no scars, masses, tenderness; no asymmetry, skin changes, nipple discharge, no gynecomastia   Gastrointestinal: soft, non-tender; no hepatosplenomegaly, masses; active bowel sounds all quadrants, ; no masses, tenderness, hemorrhoids  Genitourinary: no hernia, testicular mass, or prostate enlargement Lymphatic: no cervical, axillary or inguinal  adenopathy Musculoskeletal: gait normal, muscle tone and strength WNL, no joint swelling, effusions, discoloration, crepitus  Skin: clear, good turgor, color WNL, no rashes, lesions, or ulcerations Neurologic: normal mental status, normal reflexes, normal strength, sensation, and motion Psychiatric: alert; oriented to person, place and time Other Exam:

## 2010-07-02 NOTE — Miscellaneous (Signed)
   Clinical Lists Changes  Orders: Added new Service order of Admin 1st Vaccine (16109) - Signed Added new Service order of Flu Vaccine 54yrs + [phone]) - Signed Observations: Added new observation of FLU VAX VIS: 12/25/09 version (02/25/2010 12:03) Added new observation of FLU VAXLOT: [id number] (02/25/2010 12:03) Added new observation of FLU VAXMFR: Glaxosmithkline (02/25/2010 12:03) Added new observation of FLU VAX EXP: 11/30/2010 (02/25/2010 12:03) Added new observation of FLU VAX DSE: 0.76ml (02/25/2010 12:03) Added new observation of FLU VAX: Fluvax 3+ (02/25/2010 12:03)Flu Vaccine Consent Questions     Do you have a history of severe allergic reactions to this vaccine? no    Any prior history of allergic reactions to egg and/or gelatin? no    Do you have a sensitivity to the preservative Thimersol? no    Do you have a past history of Guillan-Barre Syndrome? no    Do you currently have an acute febrile illness? no    Have you ever had a severe reaction to latex? no    Vaccine information given and explained to patient? yes    Are you currently pregnant? no    Lot Number:[id number]   Exp Date:11/30/2010   Site Given  Left Deltoid IMw observation of FLU VAX VIS: 12/25/09 version (02/25/2010 12:03) Added new observation of FLU VAXLOT: [id number] (02/25/2010 12:03) Added new observation of FLU VAXMFR: Glaxosmithkline (02/25/2010 12:03) Added new observation of FLU VAX EXP: 11/30/2010 (02/25/2010 12:03) Added new observation of FLU VAX DSE: 0.28ml (02/25/2010 12:03) Added new observation of FLU VAX: Fluvax 3+ (02/25/2010 12:03)  .lbflu

## 2010-07-04 NOTE — Letter (Signed)
Summary: [facility]  [facility]   Imported By: Maryln Gottron 05/16/2010 13:30:11  _____________________________________________________________________  External Attachment:    Type:   Image     Comment:   External Document

## 2010-07-04 NOTE — Progress Notes (Signed)
  Phone Note From Pharmacy   Details for Reason: refill Summary of Call: refill doxycycl hyc 100mg . last refill date 06/12/2010 Initial call taken by: Kyung Rudd, CMA,  June 25, 2010 9:37 AM  Follow-up for Phone Call        seen for bronchitis that date and given doxy. ?no better? Follow-up by: Edwyna Perfect MD,  June 25, 2010 9:46 AM  Additional Follow-up for Phone Call Additional follow up Details #1::        pt is  not better yet..feel like he may need another round of the abx Additional Follow-up by: Kyung Rudd, CMA,  June 25, 2010 10:01 AM    New/Updated Medications: DOXYCYCLINE HYCLATE 100 MG TABS (DOXYCYCLINE HYCLATE) take one tablet by mouth two times a day Prescriptions: DOXYCYCLINE HYCLATE 100 MG TABS (DOXYCYCLINE HYCLATE) take one tablet by mouth two times a day  #14 x 0   Entered by:   Kyung Rudd, CMA   Authorized by:   Edwyna Perfect MD   Signed by:   Kyung Rudd, CMA on 06/25/2010   Method used:   Electronically to        [facility]  [phone]* (retail)       [address]       [place], [place]  [address]       Ph: [phone] or [phone]       Fax: [phone]   RxID:   [id number]

## 2010-07-04 NOTE — Assessment & Plan Note (Signed)
Summary: congestion/njr   Vital Signs:  Patient profile:   65 year old male Weight:      242 pounds Pulse rate:   80 / minute BP sitting:   108 / 74  (left arm)  Vitals Entered By: Kyung Rudd, CMA (June 12, 2010 10:20 AM) CC: pt c/o productive cough and congestion x 4wks   CC:  pt c/o productive cough and congestion x 4wks.  History of Present Illness: Patient presents to clinic as a workin for evaluation of cough. Notes 3-4 wk h/o of NP dry cough. Cough occurs in spells and is improved with cough drops. +sick exposure. +clear nasal drainage. Denies f/c, dyspnea, wheezing, hemoptysis. No other complaints.  Current Medications (verified): 1)  Doxazosin Mesylate 4 Mg  Tabs (Doxazosin Mesylate) .... 1/2/ Daily 2)  Icaps Mv   Tabs (Multiple Vitamins-Minerals) .... Once Daily 3)  Omeprazole 20 Mg Cpdr (Omeprazole) .... One By Mouth Every Other Day 4)  Dulcolax .... As Needed 5)  Tamsulosin Hcl 0.4 Mg Caps (Tamsulosin Hcl) .... Once Daily 6)  Oxycodone-Acetaminophen 10-650 Mg Tabs (Oxycodone-Acetaminophen) .... 1/2-1 1/2 Q 6hrs As Needed  Allergies (verified): No Known Drug Allergies  Past History:  Past medical, surgical, family and social histories (including risk factors) reviewed, and no changes noted (except as noted below).  Past Medical History: Reviewed history from 10/04/2007 and no changes required. Hyperlipidemia Benign prostatic hypertrophy Remote asbestos exposure (1980) Allergic rhinitis GERD  Past Surgical History: Reviewed history from 03/08/2007 and no changes required. R Knee Arthroscopy Colonoscopy Inguinal herniorrhaphy Tonsillectomy  Family History: Reviewed history from 10/04/2007 and no changes required. Family History of Colon CA 1st degree relative --mother and father Family History Hypertension  Social History: Reviewed history from 10/04/2007 and no changes required. 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 Eyes:  Denies double vision, eye irritation, eye pain, and red eye. ENT:  Denies ear discharge, earache, and sore throat. Resp:  Complains of cough; denies coughing up blood, shortness of breath, sputum productive, and wheezing. GI:  Denies abdominal pain, constipation, and diarrhea. Derm:  Denies changes in color of skin, changes in nail beds, and rash.  Physical Exam  General:  Well-developed,well-nourished,in no acute distress; alert,appropriate and cooperative throughout examination Head:  Normocephalic and atraumatic without obvious abnormalities. No apparent alopecia or balding. Eyes:  pupils equal, pupils round, corneas and lenses clear, and no injection.   Ears:  External ear exam shows no significant lesions or deformities.  Otoscopic examination reveals clear canals, tympanic membranes are intact bilaterally without bulging, retraction, inflammation or discharge. Hearing is grossly normal bilaterally. Nose:  External nasal examination shows no deformity or inflammation. Nasal mucosa are pink and moist without lesions or exudates. Mouth:  Oral mucosa and oropharynx without lesions or exudates.  Teeth in good repair. Neck:  No deformities, masses, or tenderness noted. Lungs:  Normal respiratory effort, chest expands symmetrically. Lungs are clear to auscultation, no crackles or wheezes. Heart:  Normal rate and regular rhythm. S1 and S2 normal without gallop, murmur, click, rub or other extra sounds. Skin:  turgor normal, color normal, and no rashes.   Cervical Nodes:  No lymphadenopathy noted   Impression & Recommendations:  Problem # 1:  ACUTE BRONCHITIS (ICD-466.0) Assessment New Begin abx tx. Followup if no improvement or worsening.  His updated medication list for this problem includes:    Doxycycline Hyclate 100 Mg Tabs (Doxycycline hyclate) .Marland KitchenMarland KitchenMarland KitchenMarland Kitchen  One by mouth bid  Complete  Medication List: 1)  Doxazosin Mesylate 4 Mg Tabs (Doxazosin mesylate) .... 1/2/ daily 2)  Icaps Mv Tabs (Multiple vitamins-minerals) .... Once daily 3)  Omeprazole 20 Mg Cpdr (Omeprazole) .... One by mouth every other day 4)  Dulcolax  .... As needed 5)  Tamsulosin Hcl 0.4 Mg Caps (Tamsulosin hcl) .... Once daily 6)  Oxycodone-acetaminophen 10-650 Mg Tabs (Oxycodone-acetaminophen) .... 1/2-1 1/2 q 6hrs as needed 7)  Doxycycline Hyclate 100 Mg Tabs (Doxycycline hyclate) .... One by mouth bid Prescriptions: DOXYCYCLINE HYCLATE 100 MG TABS (DOXYCYCLINE HYCLATE) one by mouth bid  #14 x 0   Entered and Authorized by:   Edwyna Perfect MD   Signed by:   Edwyna Perfect MD on 06/12/2010   Method used:   Electronically to        [facility]  [phone]* (retail)       [address]       [place], [place]  [address]       Ph: [phone] or [phone]       Fax: [phone]   RxID:   [id number]    Orders Added: 1)  Est. Patient Level III [34742]

## 2010-08-15 LAB — URINALYSIS, ROUTINE W REFLEX MICROSCOPIC
Glucose, UA: NEGATIVE mg/dL
Ketones, ur: NEGATIVE mg/dL
Leukocytes, UA: NEGATIVE
Nitrite: NEGATIVE
Protein, ur: NEGATIVE mg/dL
Specific Gravity, Urine: 1.018 (ref 1.005–1.030)
Urobilinogen, UA: 0.2 mg/dL (ref 0.0–1.0)
pH: 5.5 (ref 5.0–8.0)

## 2010-08-15 LAB — URINE MICROSCOPIC-ADD ON

## 2011-01-01 DIAGNOSIS — K529 Noninfective gastroenteritis and colitis, unspecified: Secondary | ICD-10-CM

## 2011-01-01 HISTORY — DX: Noninfective gastroenteritis and colitis, unspecified: K52.9

## 2011-01-09 ENCOUNTER — Telehealth: Payer: Self-pay | Admitting: Internal Medicine

## 2011-01-09 DIAGNOSIS — K625 Hemorrhage of anus and rectum: Secondary | ICD-10-CM

## 2011-01-09 NOTE — Telephone Encounter (Signed)
I agree, he ought to have a direct colon

## 2011-01-09 NOTE — Telephone Encounter (Signed)
Patient calling to report that he is having ocassional bright red blood in stool. Happens about every 3 months. It happened 2 days ago and he states it was a fair amount of blood that colored the toilet water. He did not have blood on toilet paper when he wiped. Denies pain, diarrhea or constipation. Thinks he may have had an internal hemorrhoid in the past but is not sure. Hx adenomatous polyp, mother and father had colon cancer. Last colon-04/09/07- normal exam, diverticulosis. Patient wants to know if he can have colonoscopy early (due 11/12013) Please, advise.

## 2011-01-10 ENCOUNTER — Encounter: Payer: Self-pay | Admitting: *Deleted

## 2011-01-10 NOTE — Telephone Encounter (Signed)
Spoke with patient and scheduled direct colonoscopy on 01/23/11 at 11:30 AM with 10:30 AM arrival. Pre Visit on 01/16/11 at 2:30 PM. Letter mailed.

## 2011-01-16 ENCOUNTER — Ambulatory Visit (AMBULATORY_SURGERY_CENTER): Payer: Federal, State, Local not specified - PPO | Admitting: *Deleted

## 2011-01-16 DIAGNOSIS — Z860100 Personal history of colon polyps, unspecified: Secondary | ICD-10-CM

## 2011-01-16 DIAGNOSIS — Z800 Family history of malignant neoplasm of digestive organs: Secondary | ICD-10-CM

## 2011-01-16 DIAGNOSIS — K921 Melena: Secondary | ICD-10-CM

## 2011-01-16 MED ORDER — PEG-KCL-NACL-NASULF-NA ASC-C 100 G PO SOLR: 1.0000 | Freq: Once | ORAL | Status: DC

## 2011-01-17 ENCOUNTER — Encounter: Payer: Self-pay | Admitting: Internal Medicine

## 2011-01-23 ENCOUNTER — Encounter: Payer: Self-pay | Admitting: Internal Medicine

## 2011-01-23 ENCOUNTER — Ambulatory Visit (AMBULATORY_SURGERY_CENTER): Payer: Federal, State, Local not specified - PPO | Admitting: Internal Medicine

## 2011-01-23 ENCOUNTER — Encounter: Payer: Self-pay | Admitting: *Deleted

## 2011-01-23 DIAGNOSIS — K5289 Other specified noninfective gastroenteritis and colitis: Secondary | ICD-10-CM

## 2011-01-23 DIAGNOSIS — D126 Benign neoplasm of colon, unspecified: Secondary | ICD-10-CM

## 2011-01-23 DIAGNOSIS — Z860100 Personal history of colon polyps, unspecified: Secondary | ICD-10-CM

## 2011-01-23 DIAGNOSIS — Z800 Family history of malignant neoplasm of digestive organs: Secondary | ICD-10-CM

## 2011-01-23 DIAGNOSIS — K625 Hemorrhage of anus and rectum: Secondary | ICD-10-CM

## 2011-01-23 DIAGNOSIS — Z1211 Encounter for screening for malignant neoplasm of colon: Secondary | ICD-10-CM

## 2011-01-23 MED ORDER — SODIUM CHLORIDE 0.9 % IV SOLN: 500.0000 mL | INTRAVENOUS | Status: DC

## 2011-01-23 NOTE — Patient Instructions (Signed)
RESUME ALL MEDICATIONS. INFORMATION GIVEN ON DIVERTICULOSIS, POLYPS, AND HIGH FIBER DIET. CALL TO MAKE FOLLOW UP APPT. WITH DR. Juanda Chance IN 3 WEEKS.

## 2011-01-24 ENCOUNTER — Telehealth: Payer: Self-pay | Admitting: *Deleted

## 2011-01-24 NOTE — Telephone Encounter (Signed)

## 2011-01-29 ENCOUNTER — Encounter: Payer: Self-pay | Admitting: Internal Medicine

## 2011-02-24 ENCOUNTER — Encounter: Payer: Self-pay | Admitting: Internal Medicine

## 2011-02-24 ENCOUNTER — Ambulatory Visit (INDEPENDENT_AMBULATORY_CARE_PROVIDER_SITE_OTHER): Payer: Federal, State, Local not specified - PPO | Admitting: Internal Medicine

## 2011-02-24 VITALS — BP 134/80 | HR 68 | Ht 72.0 in | Wt 243.0 lb

## 2011-02-24 DIAGNOSIS — K5100 Ulcerative (chronic) pancolitis without complications: Secondary | ICD-10-CM

## 2011-02-24 MED ORDER — MESALAMINE 1.2 G PO TBEC: DELAYED_RELEASE_TABLET | ORAL | Status: DC

## 2011-02-24 NOTE — Progress Notes (Signed)
  Nicholas Owen 02-17-1946 MRN [id number]     History of Present Illness:  This is a 65 year old white male with a new diagnosis of chronic colitis made on a routine colonoscopy last month. The inflammation involved the right colon only. Random biopsies from the left colon showed normal mucosa. He has a positive family history of colon cancer in his father and a personal history of tubular adenomas on a colonoscopy in 2005. There were no polyps in 2001 and 2008. His last colonoscopy showed a tubular adenoma. He has been on Naprosyn 220 mg 3-4 times a week for knee pain. He has noted soft stools and more frequent stools as well; hs usual bowel habits being one bowel movement a day currently to 2 bowel movements a day. He denies any visible blood per rectum.  Past Medical History  Diagnosis Date  . Allergy   . Cancer     basal cell CA (skin)  . GERD (gastroesophageal reflux disease)   . BPH (benign prostatic hyperplasia)   . Hyperlipidemia   . Asbestos exposure   . Diverticulosis   . Tubular adenoma of colon 01/2011  . Colitis 01/2011  . Hiatal hernia 03/2010  . Liver cyst 10/11    4 simple cysts in liver   Past Surgical History  Procedure Date  . Colonoscopy   . Polypectomy   . Knee surgery     right  . Tonsillectomy   . Inguinal hernia repair     double  . Lipoma removals     reports that he has never smoked. He has never used smokeless tobacco. He reports that he does not drink alcohol or use illicit drugs. family history includes Colon cancer in his father and mother.  There is no history of Esophageal cancer and Stomach cancer. No Known Allergies      Review of Systems:  The remainder of the 10  point ROS is negative except as outlined in H&P   Physical Exam: General appearance  Well developed, in no distress. Eyes- non icteric. HEENT nontraumatic, normocephalic. Mouth no lesions, tongue papillated, no cheilosis. Neck supple without adenopathy, thyroid not  enlarged, no carotid bruits, no JVD. Lungs Clear to auscultation bilaterally. Cor normal S1 normal S2, regular rhythm , no murmur,  quiet precordium. Abdomen  No tenderness. Rectal:not repeated. Extremities no pedal edema. Skin no lesions. Neurological alert and oriented x 3. Psychological normal mood and affect.   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 His father had ulcerative colitis as well. He will be started on Lialda 1.2 g daily. He will return in 3 months. We will consider IBD markers. I asked him to discontinue Naprosyn and take Tylenol or aspirin instead.   02/24/2011 Lina Sar

## 2011-02-24 NOTE — Patient Instructions (Signed)
Your prescription have been sent to you pharmacy: Lialda 1.2g take one tablet by mouth once a day #90. CC: Nicholas Owen

## 2011-03-19 ENCOUNTER — Emergency Department (HOSPITAL_COMMUNITY)
Admission: EM | Admit: 2011-03-19 | Discharge: 2011-03-20 | Disposition: A | Discharge status: Home / Self Care | Payer: Federal, State, Local not specified - PPO | Attending: Emergency Medicine | Admitting: Emergency Medicine

## 2011-03-19 ENCOUNTER — Ambulatory Visit (INDEPENDENT_AMBULATORY_CARE_PROVIDER_SITE_OTHER): Payer: Federal, State, Local not specified - PPO | Admitting: Family Medicine

## 2011-03-19 ENCOUNTER — Encounter (INDEPENDENT_AMBULATORY_CARE_PROVIDER_SITE_OTHER): Payer: Federal, State, Local not specified - PPO | Admitting: Cardiology

## 2011-03-19 ENCOUNTER — Encounter: Payer: Self-pay | Admitting: Family Medicine

## 2011-03-19 VITALS — BP 130/90 | HR 78 | Temp 98.6°F | Wt 238.0 lb

## 2011-03-19 DIAGNOSIS — I8000 Phlebitis and thrombophlebitis of superficial vessels of unspecified lower extremity: Secondary | ICD-10-CM

## 2011-03-19 DIAGNOSIS — M79606 Pain in leg, unspecified: Secondary | ICD-10-CM

## 2011-03-19 DIAGNOSIS — Z23 Encounter for immunization: Secondary | ICD-10-CM

## 2011-03-19 DIAGNOSIS — L539 Erythematous condition, unspecified: Secondary | ICD-10-CM

## 2011-03-19 DIAGNOSIS — M79609 Pain in unspecified limb: Secondary | ICD-10-CM | POA: Insufficient documentation

## 2011-03-19 DIAGNOSIS — R609 Edema, unspecified: Secondary | ICD-10-CM

## 2011-03-19 DIAGNOSIS — I82409 Acute embolism and thrombosis of unspecified deep veins of unspecified lower extremity: Secondary | ICD-10-CM

## 2011-03-19 LAB — DIFFERENTIAL
Basophils Absolute: 0 10*3/uL (ref 0.0–0.1)
Basophils Relative: 0 % (ref 0–1)
Eosinophils Relative: 5 % (ref 0–5)
Lymphocytes Relative: 15 % (ref 12–46)
Monocytes Absolute: 0.5 10*3/uL (ref 0.1–1.0)

## 2011-03-19 LAB — BASIC METABOLIC PANEL
CO2: 23 mEq/L (ref 19–32)
GFR calc non Af Amer: 87 mL/min — ABNORMAL LOW (ref >90)
Glucose, Bld: 108 mg/dL — ABNORMAL HIGH (ref 70–99)
Potassium: 3.9 mEq/L (ref 3.5–5.1)
Sodium: 136 mEq/L (ref 135–145)

## 2011-03-19 LAB — CBC
HCT: 44.5 % (ref 39.0–52.0)
MCHC: 35.3 g/dL (ref 30.0–36.0)
Platelets: 236 10*3/uL (ref 150–400)
RDW: 13 % (ref 11.5–15.5)
WBC: 6.9 10*3/uL (ref 4.0–10.5)

## 2011-03-19 LAB — PROTIME-INR
INR: 0.98 (ref 0.00–1.49)
Prothrombin Time: 13.2 seconds (ref 11.6–15.2)

## 2011-03-19 LAB — APTT: aPTT: 58 seconds — ABNORMAL HIGH (ref 24–37)

## 2011-03-19 NOTE — Progress Notes (Signed)
  Subjective:    Patient ID: Nicholas Owen, male    DOB: 24-Mar-1946, 65 y.o.   MRN: [id number]  HPI Here for  5 days of swelling and pain in the right thigh, and now a red streak is spreading from the upper inner thigh down to the knee. No chest pain ir SOB. No feet swelling.   Review of Systems  Constitutional: Negative.   Respiratory: Negative.   Cardiovascular: Positive for leg swelling. Negative for chest pain and palpitations.       Objective:   Physical Exam  Constitutional: He appears well-developed and well-nourished.  Neck: No thyromegaly present.  Cardiovascular: Normal rate, regular rhythm, normal heart sounds and intact distal pulses.   Pulmonary/Chest: Effort normal and breath sounds normal. No respiratory distress. He has no wheezes. He has no rales. He exhibits no tenderness.  Musculoskeletal:       The inner right thigh hs some erythema from the groin to the knee with mild tenderness and swelling. Cords are palpable   Lymphadenopathy:    He has no cervical adenopathy.          Assessment & Plan:  He was sent to the Vascular lab for a venous doppler, and this was positive for a DVT at the junction of the saphenous and femoral veins. He was told to go to [facility] ER for probable admission for anticoagulation.

## 2011-03-20 ENCOUNTER — Ambulatory Visit (INDEPENDENT_AMBULATORY_CARE_PROVIDER_SITE_OTHER): Payer: Federal, State, Local not specified - PPO | Admitting: Internal Medicine

## 2011-03-20 ENCOUNTER — Telehealth: Payer: Self-pay | Admitting: *Deleted

## 2011-03-20 DIAGNOSIS — I749 Embolism and thrombosis of unspecified artery: Secondary | ICD-10-CM

## 2011-03-20 DIAGNOSIS — M79604 Pain in right leg: Secondary | ICD-10-CM

## 2011-03-20 DIAGNOSIS — I8290 Acute embolism and thrombosis of unspecified vein: Secondary | ICD-10-CM

## 2011-03-20 MED ORDER — ENOXAPARIN SODIUM 80 MG/0.8ML ~~LOC~~ SOLN
80.0000 mg | Freq: Once | SUBCUTANEOUS | Status: AC
  Administered 2011-03-20: 80 mg via SUBCUTANEOUS

## 2011-03-20 NOTE — Telephone Encounter (Signed)
Pt came in for lovenox and doppler scheduled for tomorrow

## 2011-03-20 NOTE — Telephone Encounter (Signed)
Pt was in the ER all day yesterday with a DVT, and the ER MD told them to follow up with Dr. Cato Mulligan today.

## 2011-03-20 NOTE — Telephone Encounter (Signed)
Pt to have lovenox 100mg  SQ today---repeat doppler ultrasound tomorrow

## 2011-03-21 ENCOUNTER — Encounter (INDEPENDENT_AMBULATORY_CARE_PROVIDER_SITE_OTHER): Payer: Federal, State, Local not specified - PPO | Admitting: Cardiology

## 2011-03-21 ENCOUNTER — Ambulatory Visit: Payer: Federal, State, Local not specified - PPO | Admitting: Internal Medicine

## 2011-03-21 ENCOUNTER — Telehealth: Payer: Self-pay | Admitting: Internal Medicine

## 2011-03-21 DIAGNOSIS — I8000 Phlebitis and thrombophlebitis of superficial vessels of unspecified lower extremity: Secondary | ICD-10-CM | POA: Insufficient documentation

## 2011-03-21 DIAGNOSIS — M79604 Pain in right leg: Secondary | ICD-10-CM

## 2011-03-21 MED ORDER — IBUPROFEN 200 MG PO TABS: 600.0000 mg | ORAL_TABLET | Freq: Three times a day (TID) | ORAL | Status: DC

## 2011-03-21 NOTE — Telephone Encounter (Signed)
Have reviewed ultrasounds with PV labs Superficial clot only. See meds

## 2011-03-23 NOTE — Progress Notes (Signed)
  Subjective:    Patient ID: Nicholas Owen, male    DOB: [birth year], 65 y.o.   MRN: [id number]  HPI  Patient with superficial thrombophlebitis. Saphenous vein is occluded. There is a low flow state and some deep veins. Comes in for Lovenox injection prior to repeat ultrasound.  Review of Systems     Objective:   Physical Exam        Assessment & Plan:

## 2011-03-26 ENCOUNTER — Encounter: Payer: Self-pay | Admitting: Internal Medicine

## 2011-03-26 ENCOUNTER — Ambulatory Visit (INDEPENDENT_AMBULATORY_CARE_PROVIDER_SITE_OTHER): Payer: Federal, State, Local not specified - PPO | Admitting: Internal Medicine

## 2011-03-26 DIAGNOSIS — I8000 Phlebitis and thrombophlebitis of superficial vessels of unspecified lower extremity: Secondary | ICD-10-CM

## 2011-03-26 NOTE — Progress Notes (Signed)
  Subjective:    Patient ID: Nicholas Owen, male    DOB: 06-Sep-1945, 65 y.o.   MRN: [id number]  HPI  He comes in for evaluation of superficial saphenous vein clot. He was seen in the emergency department. There was some question of infection he's been treated with Augmentin. Given the size of the clot and some haziness in the deep veins he was treated with 2 doses of Lovenox and followed with serial Doppler studies. No DVT has been recognized. He says his leg feels much better. Erythema has essentially resolved. He is left with minimal discomfort behind the knee. He states the pain is 95% better.  Past Medical History  Diagnosis Date  . Allergy   . Cancer     basal cell CA (skin)  . GERD (gastroesophageal reflux disease)   . BPH (benign prostatic hyperplasia)   . Hyperlipidemia   . Asbestos exposure   . Diverticulosis   . Tubular adenoma of colon 01/2011  . Colitis 01/2011  . Hiatal hernia 03/2010  . Liver cyst 10/11    4 simple cysts in liver   Past Surgical History  Procedure Date  . Colonoscopy   . Polypectomy   . Knee surgery     right  . Tonsillectomy   . Inguinal hernia repair     double  . Lipoma removals     reports that he has never smoked. He has never used smokeless tobacco. He reports that he does not drink alcohol or use illicit drugs. family history includes Colon cancer in his father and mother.  There is no history of Esophageal cancer and Stomach cancer. No Known Allergies   Review of Systems  patient denies chest pain, shortness of breath, orthopnea. Denies lower extremity edema, abdominal pain, change in appetite, change in bowel movements. Patient denies rashes, musculoskeletal complaints. No other specific complaints in a complete review of systems.      Objective:   Physical Exam  well-developed well-nourished male in no acute distress. HEENT exam atraumatic, normocephalic, neck supple without jugular venous distention. Chest clear to auscultation  cardiac exam S1-S2 are regular. Abdominal exam overweight with bowel sounds, soft and nontender. Extremities no edema I am able to feel a superficial cord approximately 5 inches below the waist on the right leg. It measured approximately 5 cm. No other cord is identified.. Neurologic exam is alert with a normal gait.        Assessment & Plan:

## 2011-03-26 NOTE — Assessment & Plan Note (Signed)
Patient has been treated with 2 doses of Lovenox. He started ibuprofen. Is taking it regularly. I've asked him to continue that for another 2 weeks. I've reviewed the Doppler studies. I don't think any further evaluation is necessary. I discussed with the patient and he should call for recurrent symptoms.

## 2011-04-10 ENCOUNTER — Telehealth: Payer: Self-pay | Admitting: Internal Medicine

## 2011-04-10 NOTE — Telephone Encounter (Signed)
Patient's wife calling to check with Dr. Juanda Chance to see if patient can drink Essiac tea- which is an herbal tea. Ingredients are: polarized water, burdock root, sheep sorrel, [place] rhubarb root, slippery elm bark. Hx segmented colitis. Please, advise.

## 2011-04-10 NOTE — Telephone Encounter (Signed)
OK to drink the tea, slippery elm has a laxative properties.

## 2011-04-11 NOTE — Telephone Encounter (Signed)
Spoke with patient's wife and gave her Dr. Regino Schultze recommendations.

## 2011-04-21 ENCOUNTER — Telehealth: Payer: Self-pay

## 2011-04-21 NOTE — Telephone Encounter (Signed)
Stop ibuprofen Make sure he is taking ASA daily

## 2011-04-21 NOTE — Telephone Encounter (Signed)
Pt called and stated he was being treated for a blood clot in his leg and has been taking ibuprofen 800 mg tid.  Pt states he has been doing this for about 3 weeks.  Pt states the discoloration and pain when walking has cleared up.  Pt is experiencing tenderness to touch and would like to know if he needs to continue taking the ibuprofen.  Pls advise.

## 2011-04-22 NOTE — Telephone Encounter (Signed)
Pt aware.

## 2011-04-23 ENCOUNTER — Telehealth: Payer: Self-pay | Admitting: *Deleted

## 2011-04-23 NOTE — Telephone Encounter (Signed)
Pt had a blood clot in the his leg and was taking IBF 600 mg tid, it is still tender to touch but no pain.  Pt wants to know if he still needs to continue the IBF?

## 2011-04-24 NOTE — Telephone Encounter (Signed)
Reasonable to continue for 6 more weeks

## 2011-04-25 ENCOUNTER — Other Ambulatory Visit (INDEPENDENT_AMBULATORY_CARE_PROVIDER_SITE_OTHER): Payer: Federal, State, Local not specified - PPO

## 2011-04-25 DIAGNOSIS — Z0000 Encounter for general adult medical examination without abnormal findings: Secondary | ICD-10-CM

## 2011-04-25 LAB — CBC WITH DIFFERENTIAL/PLATELET
Basophils Relative: 0.7 % (ref 0.0–3.0)
Eosinophils Relative: 12.6 % — ABNORMAL HIGH (ref 0.0–5.0)
HCT: 42.2 % (ref 39.0–52.0)
Hemoglobin: 14.4 g/dL (ref 13.0–17.0)
Lymphs Abs: 1.3 10*3/uL (ref 0.7–4.0)
MCV: 88 fl (ref 78.0–100.0)
Monocytes Absolute: 0.5 10*3/uL (ref 0.1–1.0)
Monocytes Relative: 9.8 % (ref 3.0–12.0)
Neutro Abs: 2.7 10*3/uL (ref 1.4–7.7)
Platelets: 300 10*3/uL (ref 150.0–400.0)
RBC: 4.79 Mil/uL (ref 4.22–5.81)
WBC: 5.2 10*3/uL (ref 4.5–10.5)

## 2011-04-25 LAB — PSA: PSA: 4.79 ng/mL — ABNORMAL HIGH (ref 0.10–4.00)

## 2011-04-25 LAB — HEPATIC FUNCTION PANEL
ALT: 30 U/L (ref 0–53)
AST: 23 U/L (ref 0–37)
Albumin: 3.9 g/dL (ref 3.5–5.2)
Total Protein: 6.2 g/dL (ref 6.0–8.3)

## 2011-04-25 LAB — LIPID PANEL
Cholesterol: 182 mg/dL (ref 0–200)
Triglycerides: 107 mg/dL (ref 0.0–149.0)

## 2011-04-25 LAB — BASIC METABOLIC PANEL
BUN: 18 mg/dL (ref 6–23)
Chloride: 108 mEq/L (ref 96–112)
Potassium: 4.3 mEq/L (ref 3.5–5.1)
Sodium: 140 mEq/L (ref 135–145)

## 2011-04-25 LAB — TSH: TSH: 2.18 u[IU]/mL (ref 0.35–5.50)

## 2011-04-25 LAB — POCT URINALYSIS DIPSTICK
Bilirubin, UA: NEGATIVE
Blood, UA: NEGATIVE
Glucose, UA: NEGATIVE
Spec Grav, UA: 1.02
Urobilinogen, UA: 0.2

## 2011-04-25 NOTE — Telephone Encounter (Signed)
Notified wife.

## 2011-04-28 ENCOUNTER — Other Ambulatory Visit: Payer: Federal, State, Local not specified - PPO

## 2011-05-07 ENCOUNTER — Encounter: Payer: Federal, State, Local not specified - PPO | Admitting: Internal Medicine

## 2011-05-14 ENCOUNTER — Encounter: Payer: Self-pay | Admitting: Internal Medicine

## 2011-05-14 ENCOUNTER — Ambulatory Visit (INDEPENDENT_AMBULATORY_CARE_PROVIDER_SITE_OTHER): Payer: Federal, State, Local not specified - PPO | Admitting: Internal Medicine

## 2011-05-14 VITALS — BP 118/74 | HR 64 | Temp 98.0°F | Ht 72.0 in | Wt 242.0 lb

## 2011-05-14 DIAGNOSIS — Z23 Encounter for immunization: Secondary | ICD-10-CM

## 2011-05-14 DIAGNOSIS — Z0000 Encounter for general adult medical examination without abnormal findings: Secondary | ICD-10-CM

## 2011-05-14 DIAGNOSIS — N200 Calculus of kidney: Secondary | ICD-10-CM

## 2011-05-14 DIAGNOSIS — I8000 Phlebitis and thrombophlebitis of superficial vessels of unspecified lower extremity: Secondary | ICD-10-CM

## 2011-05-14 DIAGNOSIS — R972 Elevated prostate specific antigen [PSA]: Secondary | ICD-10-CM

## 2011-05-14 DIAGNOSIS — Z8546 Personal history of malignant neoplasm of prostate: Secondary | ICD-10-CM | POA: Insufficient documentation

## 2011-05-14 NOTE — Progress Notes (Signed)
Patient ID: Nicholas Owen, male   DOB: [birth year], 65 y.o.   MRN: [id number] cpx  Past Medical History  Diagnosis Date  . Allergy   . Cancer     basal cell CA (skin)  . GERD (gastroesophageal reflux disease)   . BPH (benign prostatic hyperplasia)   . Hyperlipidemia   . Asbestos exposure   . Diverticulosis   . Tubular adenoma of colon 01/2011  . Colitis 01/2011  . Hiatal hernia 03/2010  . Liver cyst 10/11    4 simple cysts in liver    History   Social History  . Marital Status: Married    Spouse Name: N/A    Number of Children: N/A  . Years of Education: N/A   Occupational History  . Not on file.   Social History Main Topics  . Smoking status: Never Smoker   . Smokeless tobacco: Never Used  . Alcohol Use: No  . Drug Use: No  . Sexually Active: Not on file   Other Topics Concern  . Not on file   Social History Narrative  . No narrative on file    Past Surgical History  Procedure Date  . Colonoscopy   . Polypectomy   . Knee surgery     right  . Tonsillectomy   . Inguinal hernia repair     double  . Lipoma removals     Family History  Problem Relation Age of Onset  . Colon cancer Mother   . Colon cancer Father   . Esophageal cancer Neg Hx   . Stomach cancer Neg Hx     No Known Allergies  Current Outpatient Prescriptions on File Prior to Visit  Medication Sig Dispense Refill  . aspirin 81 MG tablet Take 81 mg by mouth daily.        Marland Kitchen ibuprofen (MOTRIN IB) 200 MG tablet Take 3 tablets (600 mg total) by mouth 3 (three) times daily.  100 tablet  2  . Inositol Niacinate (NIACIN FLUSH FREE PO) Take 1 tablet by mouth daily.        . mesalamine (LIALDA) 1.2 G EC tablet Take one tablet by mouth once a day  90 tablet  3  . Multiple Vitamin (MULTIVITAMIN PO) Take 1 tablet by mouth daily.        . Multiple Vitamins-Minerals (VISION FORMULA PO) Take 1 tablet by mouth daily.        Marland Kitchen omeprazole (PRILOSEC) 20 MG capsule 1 tablet Daily.      . Saw Palmetto 450  MG CAPS Take 1 capsule by mouth daily.        . Tamsulosin HCl (FLOMAX) 0.4 MG CAPS 1 tablet Daily.         patient denies chest pain, shortness of breath, orthopnea. Denies lower extremity edema, abdominal pain, change in appetite, change in bowel movements. Patient denies rashes, musculoskeletal complaints. No other specific complaints in a complete review of systems.   BP 118/74  Pulse 64  Temp(Src) 98 F (36.7 C) (Oral)  Ht 6' (1.829 m)  Wt 242 lb (109.77 kg)  BMI 32.82 kg/m2 Well-developed male in no acute distress. HEENT exam atraumatic, normocephalic, extraocular muscles are intact. Conjunctivae are pink without exudate. Neck is supple without lymphadenopathy, thyromegaly, jugular venous distention. Chest is clear to auscultation without increased work of breathing. Cardiac exam S1-S2 are regular. The PMI is normal. No significant murmurs or gallops. Abdominal exam active bowel sounds, soft, nontender. No abdominal bruits. Extremities no clubbing cyanosis  or edema. Peripheral pulses are normal without bruits. Neurologic exam alert and oriented without any motor or sensory deficits. Rectal exam per urology  A/P--well visit--health maint UTD

## 2011-05-14 NOTE — Assessment & Plan Note (Signed)
i have asked that he stop ibuprofen and be compliant with daily aspirin.

## 2011-05-20 ENCOUNTER — Ambulatory Visit (INDEPENDENT_AMBULATORY_CARE_PROVIDER_SITE_OTHER): Payer: Federal, State, Local not specified - PPO | Admitting: Family Medicine

## 2011-05-20 ENCOUNTER — Encounter: Payer: Self-pay | Admitting: Family Medicine

## 2011-05-20 VITALS — BP 134/88 | HR 70 | Temp 98.4°F | Wt 245.0 lb

## 2011-05-20 DIAGNOSIS — R05 Cough: Secondary | ICD-10-CM

## 2011-05-20 DIAGNOSIS — J40 Bronchitis, not specified as acute or chronic: Secondary | ICD-10-CM

## 2011-05-20 DIAGNOSIS — J069 Acute upper respiratory infection, unspecified: Secondary | ICD-10-CM

## 2011-05-20 MED ORDER — GUAIFENESIN-CODEINE 100-10 MG/5ML PO SYRP: 5.0000 mL | ORAL_SOLUTION | Freq: Three times a day (TID) | ORAL | Status: AC | PRN

## 2011-05-20 MED ORDER — AMOXICILLIN 500 MG PO TABS: 1000.0000 mg | ORAL_TABLET | Freq: Two times a day (BID) | ORAL | Status: AC

## 2011-05-20 NOTE — Progress Notes (Signed)
Subjective:    Patient ID: Nicholas Owen, male    DOB: 1945/12/27, 65 y.o.   MRN: [id number]  HPI 65 year old white male, nonsmoker, and with one week of cough hoarseness fatigue chest congestion that is worsening. Symptoms appear to be worse in the morning and particularly at night. He is coughing up a large amount of yellow sputum. Can take an over-the-counter Alka-Seltzer plus that does help. Denies any sick contacts. Denies any lightheadedness or dizziness no chest pain, palpitations, shortness of breath, nausea, vomiting, or edema.   Review of Systems As stated above    Past Medical History  Diagnosis Date  . Allergy   . Cancer     basal cell CA (skin)  . GERD (gastroesophageal reflux disease)   . BPH (benign prostatic hyperplasia)   . Hyperlipidemia   . Asbestos exposure   . Diverticulosis   . Tubular adenoma of colon 01/2011  . Colitis 01/2011  . Hiatal hernia 03/2010  . Liver cyst 10/11    4 simple cysts in liver    History   Social History  . Marital Status: Married    Spouse Name: N/A    Number of Children: N/A  . Years of Education: N/A   Occupational History  . Not on file.   Social History Main Topics  . Smoking status: Never Smoker   . Smokeless tobacco: Never Used  . Alcohol Use: No  . Drug Use: No  . Sexually Active: Not on file   Other Topics Concern  . Not on file   Social History Narrative  . No narrative on file    Past Surgical History  Procedure Date  . Colonoscopy   . Polypectomy   . Knee surgery     right  . Tonsillectomy   . Inguinal hernia repair     double  . Lipoma removals     Family History  Problem Relation Age of Onset  . Colon cancer Mother   . Colon cancer Father   . Esophageal cancer Neg Hx   . Stomach cancer Neg Hx     No Known Allergies  Current Outpatient Prescriptions on File Prior to Visit  Medication Sig Dispense Refill  . aspirin 81 MG tablet Take 81 mg by mouth daily.        Marland Kitchen ibuprofen  (MOTRIN IB) 200 MG tablet Take 3 tablets (600 mg total) by mouth 3 (three) times daily.  100 tablet  2  . Inositol Niacinate (NIACIN FLUSH FREE PO) Take 1 tablet by mouth daily.        . mesalamine (LIALDA) 1.2 G EC tablet Take one tablet by mouth once a day  90 tablet  3  . Multiple Vitamin (MULTIVITAMIN PO) Take 1 tablet by mouth daily.        . Multiple Vitamins-Minerals (VISION FORMULA PO) Take 1 tablet by mouth daily.        Marland Kitchen omeprazole (PRILOSEC) 20 MG capsule 1 tablet Daily.      . Saw Palmetto 450 MG CAPS Take 1 capsule by mouth daily.        . Tamsulosin HCl (FLOMAX) 0.4 MG CAPS 1 tablet Daily.        BP 134/88  Pulse 70  Temp(Src) 98.4 F (36.9 C) (Oral)  Wt 245 lb (111.131 kg)chart Objective:   Physical Exam Constitutional: Alert and oriented in no acute distress ENT: Ears are clear. Pharynx moderately red. No sinus tenderness to palpation. No lymphadenopathy. No thyromegaly masses  or bruits.  Lungs: Clear to auscultation.  Cardiac: Regular rate and rhythm, no murmurs rubs or gallops Skin: Warm and dry, no cyanosis Psychiatric: Normal mood and affect      Assessment & Plan:  Assessment: Acute bronchitis, upper respiratory infection  Plan: Amoxicillin 500 mg 2 capsules by mouth twice a day x10 days. Robitussin-AC 1 teaspoon by mouth 3 times a day when necessary. Warned of drowsiness. Over-the-counter Claritin Zyrtec, or Allegra when necessary. Patient to call if symptoms worsen or persist recheck as scheduled and when necessary

## 2011-05-20 NOTE — Patient Instructions (Addendum)

## 2011-07-10 ENCOUNTER — Other Ambulatory Visit: Payer: Self-pay | Admitting: Internal Medicine

## 2011-07-23 ENCOUNTER — Encounter: Payer: Self-pay | Admitting: Family Medicine

## 2011-07-23 ENCOUNTER — Ambulatory Visit (INDEPENDENT_AMBULATORY_CARE_PROVIDER_SITE_OTHER): Payer: Federal, State, Local not specified - PPO | Admitting: Family Medicine

## 2011-07-23 VITALS — BP 130/84 | HR 71 | Temp 98.4°F | Wt 243.0 lb

## 2011-07-23 DIAGNOSIS — J029 Acute pharyngitis, unspecified: Secondary | ICD-10-CM

## 2011-07-23 MED ORDER — LEVOFLOXACIN 500 MG PO TABS: 500.0000 mg | ORAL_TABLET | Freq: Every day | ORAL | Status: AC

## 2011-07-23 MED ORDER — LIDOCAINE VISCOUS 2 % MT SOLN: 5.0000 mL | OROMUCOSAL | Status: AC | PRN

## 2011-07-23 NOTE — Progress Notes (Signed)
  Subjective:    Patient ID: Nicholas Owen, male    DOB: [birth year], 66 y.o.   MRN: [id number]  HPI Here for 2 months of intermittent ST. This can be mild or it can be quite severe. Last night the ST was severe. No other symptoms such as sinus congestion or fever or cough. No swollen nodes in the neck. Using Motrin prn. He was here in December for a URI which involved a cough, and this resolved with a course of Amoxicillin.    Review of Systems  Constitutional: Negative.   HENT: Positive for sore throat. Negative for ear pain, congestion, trouble swallowing, postnasal drip and sinus pressure.   Eyes: Negative.   Respiratory: Negative.        Objective:   Physical Exam  Constitutional: He appears well-developed and well-nourished.  HENT:  Right Ear: External ear normal.  Left Ear: External ear normal.  Nose: Nose normal.  Mouth/Throat: Oropharynx is clear and moist. No oropharyngeal exudate.  Eyes: Conjunctivae are normal.  Neck: No thyromegaly present.  Pulmonary/Chest: Effort normal and breath sounds normal.  Lymphadenopathy:    He has no cervical adenopathy.          Assessment & Plan:  Chronic ST which may be related to a sinus infection. Try 14 days of Levaquin. Use viscous lidocaine for meal times or sleeping. If this does not improve after that, I would suggest he see ENT for possible endoscopy

## 2011-08-18 ENCOUNTER — Telehealth: Payer: Self-pay | Admitting: Internal Medicine

## 2011-08-18 NOTE — Telephone Encounter (Signed)
Patient wants to schedule f/u OV for colitis. Scheduled patient on 08/19/11 at 3:15 PM.

## 2011-08-19 ENCOUNTER — Ambulatory Visit (INDEPENDENT_AMBULATORY_CARE_PROVIDER_SITE_OTHER): Payer: Federal, State, Local not specified - PPO | Admitting: Internal Medicine

## 2011-08-19 ENCOUNTER — Encounter: Payer: Self-pay | Admitting: Internal Medicine

## 2011-08-19 VITALS — BP 126/74 | HR 76 | Ht 72.0 in | Wt 245.0 lb

## 2011-08-19 DIAGNOSIS — K5289 Other specified noninfective gastroenteritis and colitis: Secondary | ICD-10-CM

## 2011-08-19 NOTE — Patient Instructions (Signed)
Please contact your insurance company to find out if IBD SGI Diagnostic testing (through a company named [employer]) will be covered for a patient that has colitis (ICD code 558.9). The test will include serology, genetic and inflammation markers to help differentiate IBD vs non-IBC and Crohns disease vs. Ulcerative Colitis. If covered, please call our office so we can enter orders into our system for you to have labs completed. CC: Dr Birdie Sons

## 2011-08-19 NOTE — Progress Notes (Signed)
Nicholas Owen 1945/06/18 MRN [id number]        History of Present Illness:  This is a 66 year old white male with the nonspecific right-sided colitis based on  colon biopsies from August 2012. The exam was done for screening. Patient denied any symptoms of diarrhea, rectal bleeding or abdominal pain. He has a strong family history of colorectal cancer in his father at age  51 who also had colitis. And in  his mother at age 20. He has a  personal history of  tubular adenoma in 2005 and again in 2012. He had no polyps on prior colonoscopy in 2001 and 2008. We have started him on Lialda 1.2 g daily last summer and ask him to discontinue Naprosyn which could be possibly associated with a right sided colitis. Instead he started ibuprofen 600 mg daily for superficial thrombophlebitis of the right leg. He still denies any GI symptoms. He would like to have a repeat colonoscopy to see if colitis had gone away he says that his father before he passed away had colitis for several years  reports that he has never smoked. He has never used smokeless tobacco. He reports that he does not drink alcohol or use illicit drugs. family history includes Colon cancer in his father and mother.  There is no history of Esophageal cancer and Stomach cancer. No Known Allergies      Review of Systems: Denies dysphagia heartburn odynophagia abdominal pain  The remainder of the 10 point ROS is negative except as outlined in H&P   Physical Exam: General appearance  Well developed, in no distress. Eyes- non icteric. HEENT nontraumatic, normocephalic. Mouth no lesions, tongue papillated, no cheilosis. Neck supple without adenopathy, thyroid not enlarged, no carotid bruits, no JVD. Lungs Clear to auscultation bilaterally. Cor normal S1, normal S2, regular rhythm, no murmur,  quiet precordium. Abdomen: Soft nontender abdomen, protuberant. Nontender except in the right lower quadrant, no distention or  tympany. Rectal: Not done. Extremities no pedal edema. Skin no lesions. Neurological alert and oriented x 3. Psychological normal mood and affect.  Assessment and Plan:   Problem #1 Right-sided colitis as per colonoscopy 6 months ago. Patient still remains asymptomatic. Unfortunately, he started to take NSAIDs replacing Naproxen with Ibuprofen which may also be associated with colitis. I would like to speak to Dr. Cato Mulligan about a substitute for ibuprofen. Consider  repeating colonoscopy since he has no GI symptoms we can follow. He is definitely at high-risk for colon cancer due to history of colon cancer in his mother as well as his father and a personal history of polyps but the colonoscopy should be done after he has been off NSAIDS for at least 2 months. He is also complaining of the high cost of Lialda and I will  consider using sulfasalazine instead. He will contact his insurance company to see if they cover IBD markers to determine if he is high-risk for inflammatory bowel disease. When all that information is available, we will make a decision as to the timing of the repeat colonoscopy.  08/19/2011 Nicholas Owen

## 2011-08-21 ENCOUNTER — Telehealth: Payer: Self-pay | Admitting: Internal Medicine

## 2011-08-21 NOTE — Telephone Encounter (Signed)
Spoke with patient and he has discussed the lab work coverage with his insurance and he is willing to have the test done even if he has to pay out of pocket.

## 2011-08-21 NOTE — Telephone Encounter (Signed)
Patient calling to see if Dr. Juanda Chance has talked with Dr. Cato Mulligan yet about his medications and possible colonoscopy. He is checking with his insurance company about the IBD lab testing. Please, advise.

## 2011-08-21 NOTE — Telephone Encounter (Signed)
Spoke with patient's wife and she will have patient call back. 

## 2011-08-25 ENCOUNTER — Telehealth: Payer: Self-pay | Admitting: Internal Medicine

## 2011-08-25 NOTE — Telephone Encounter (Signed)
Patient calling back with a couple of questions. He has about 5 pills left of Lialda . He wants to know if he can switch to a cheaper pill or does he need to wait until he has the blood test. He is still not totally sure he wants to do the lab test due to cost. Please, advise

## 2011-09-02 ENCOUNTER — Telehealth: Payer: Self-pay | Admitting: Internal Medicine

## 2011-09-02 MED ORDER — SULFASALAZINE 500 MG PO TABS: ORAL_TABLET | ORAL | Status: DC

## 2011-09-02 NOTE — Telephone Encounter (Signed)
Spoke with patient and he wants to know if he should have the IBD labs prior to colonoscopy or have the colonoscopy first. Please, advise

## 2011-09-02 NOTE — Telephone Encounter (Signed)
Spoke with Dr. Juanda Chance and per her discussion with Dr. Cato Mulligan- may stop Ibuprofen, she suggests he see Dr. Cato Mulligan to discuss alternate treatment for thrombophlebitis. Must be off Ibuprofen 6-8 weeks prior to scheduling colonoscopy. Sulfasalazine 500 mg one po BID. Patient notified of Dr. Regino Schultze recommendations. Rx sent to pharmacy.

## 2011-09-03 NOTE — Telephone Encounter (Signed)
Spoke with patient and gave him Dr. Regino Schultze recommendation. He will call back in a few weeks to schedule the colonoscopy

## 2011-09-03 NOTE — Telephone Encounter (Signed)
Please finish up the Lialda as prescribed. Hold on doing the blood test for IBD markers till after colonoscopy because he test may not be necessary if we found colitis. He will be off Ibuprofen for 6-8 weeks. ----- Message ----- From: Daphine Deutscher, RN Sent: 09/02/2011 11:44 AM To: Hart Carwin, MD

## 2011-10-06 DIAGNOSIS — C61 Malignant neoplasm of prostate: Secondary | ICD-10-CM

## 2011-10-06 HISTORY — DX: Malignant neoplasm of prostate: C61

## 2011-10-08 ENCOUNTER — Ambulatory Visit (INDEPENDENT_AMBULATORY_CARE_PROVIDER_SITE_OTHER): Payer: Federal, State, Local not specified - PPO | Admitting: Internal Medicine

## 2011-10-08 ENCOUNTER — Encounter: Payer: Self-pay | Admitting: Internal Medicine

## 2011-10-08 VITALS — BP 122/84 | HR 76 | Temp 98.2°F | Ht 72.0 in | Wt 250.0 lb

## 2011-10-08 DIAGNOSIS — K219 Gastro-esophageal reflux disease without esophagitis: Secondary | ICD-10-CM

## 2011-10-08 DIAGNOSIS — K529 Noninfective gastroenteritis and colitis, unspecified: Secondary | ICD-10-CM

## 2011-10-08 DIAGNOSIS — E669 Obesity, unspecified: Secondary | ICD-10-CM | POA: Insufficient documentation

## 2011-10-08 DIAGNOSIS — E785 Hyperlipidemia, unspecified: Secondary | ICD-10-CM

## 2011-10-08 DIAGNOSIS — E663 Overweight: Secondary | ICD-10-CM

## 2011-10-08 DIAGNOSIS — K5289 Other specified noninfective gastroenteritis and colitis: Secondary | ICD-10-CM

## 2011-10-08 DIAGNOSIS — C61 Malignant neoplasm of prostate: Secondary | ICD-10-CM

## 2011-10-08 DIAGNOSIS — I8000 Phlebitis and thrombophlebitis of superficial vessels of unspecified lower extremity: Secondary | ICD-10-CM

## 2011-10-08 NOTE — Assessment & Plan Note (Addendum)
No known recurrence He is off asa and ibuprofen and has not had any sxs Ok to stay off meds

## 2011-10-08 NOTE — Assessment & Plan Note (Signed)
sxs are well controlled Continue PPI

## 2011-10-08 NOTE — Assessment & Plan Note (Signed)
He has f/u with urology

## 2011-10-08 NOTE — Assessment & Plan Note (Signed)
Scheduled for colonoscopy this summer Ok to stay off asa and ibuprofen

## 2011-10-08 NOTE — Progress Notes (Signed)
Patient ID: Nicholas Owen, male   DOB: May 06, 1946, 66 y.o.   MRN: [id number]  Prostate CA-- new diagnosis has scheduled appt with urology.   Superficial thrombophlebitis---no recurrence  GERD-- no sxs on ppi  Lipids-- currently on no meds  Colitis-- followed by dr. Juanda Chance  Past Medical History  Diagnosis Date  . Allergy   . Cancer     basal cell CA (skin)  . GERD (gastroesophageal reflux disease)   . BPH (benign prostatic hyperplasia)   . Hyperlipidemia   . Asbestos exposure   . Diverticulosis   . Tubular adenoma of colon 01/2011  . Colitis 01/2011  . Hiatal hernia 03/2010  . Liver cyst 10/11    4 simple cysts in liver  . Blood clot in vein   . Prostate cancer 10/06/11    History   Social History  . Marital Status: Married    Spouse Name: N/A    Number of Children: N/A  . Years of Education: N/A   Occupational History  . Not on file.   Social History Main Topics  . Smoking status: Never Smoker   . Smokeless tobacco: Never Used  . Alcohol Use: No  . Drug Use: No  . Sexually Active: Not on file   Other Topics Concern  . Not on file   Social History Narrative  . No narrative on file    Past Surgical History  Procedure Date  . Colonoscopy   . Polypectomy   . Knee surgery     right  . Tonsillectomy   . Inguinal hernia repair     double  . Lipoma removals     Family History  Problem Relation Age of Onset  . Colon cancer Mother   . Colon cancer Father   . Esophageal cancer Neg Hx   . Stomach cancer Neg Hx     Allergies  Allergen Reactions  . Nsaids     colitis    Current Outpatient Prescriptions on File Prior to Visit  Medication Sig Dispense Refill  . omeprazole (PRILOSEC) 20 MG capsule TAKE ONE CAPSULE BY MOUTH EVERY DAY  90 capsule  3  . sulfaSALAzine (AZULFIDINE) 500 MG tablet Take one po BID  60 tablet  1  . Tamsulosin HCl (FLOMAX) 0.4 MG CAPS 1 tablet Daily.         patient denies chest pain, shortness of breath, orthopnea. Denies  lower extremity edema, abdominal pain, change in appetite, change in bowel movements. Patient denies rashes, musculoskeletal complaints. No other specific complaints in a complete review of systems.   BP 122/84  Pulse 76  Temp(Src) 98.2 F (36.8 C) (Oral)  Ht 6' (1.829 m)  Wt 250 lb (113.399 kg)  BMI 33.91 kg/m2  well-developed well-nourished male in no acute distress. HEENT exam atraumatic, normocephalic, neck supple without jugular venous distention. Chest clear to auscultation cardiac exam S1-S2 are regular. Abdominal exam overweight with bowel sounds, soft and nontender. Extremities no edema. Neurologic exam is alert with a normal gait.

## 2011-10-08 NOTE — Assessment & Plan Note (Signed)
No treatment at this time is indicated

## 2011-10-30 ENCOUNTER — Other Ambulatory Visit: Payer: Self-pay | Admitting: Internal Medicine

## 2011-10-31 ENCOUNTER — Telehealth: Payer: Self-pay | Admitting: Internal Medicine

## 2011-10-31 NOTE — Telephone Encounter (Signed)
I have advised patient that a prescription was sent in yesterday for him. He has not spoken to the pharmacy yet so he was not aware. Patient would also like Dr Juanda Chance to know that he was planning to schedule a follow up colonoscopy in 8 weeks for colitis but he was recently diagnosed with prostate cancer and will be going thru treatment for that at this time.

## 2011-10-31 NOTE — Telephone Encounter (Signed)
OK, we will see him after he finishes his radiation or surgery and when he is feeling back to  Normal.

## 2011-11-07 ENCOUNTER — Other Ambulatory Visit: Payer: Self-pay | Admitting: Urology

## 2011-11-14 ENCOUNTER — Encounter: Payer: Self-pay | Admitting: Radiation Oncology

## 2011-11-14 ENCOUNTER — Encounter: Payer: Self-pay | Admitting: *Deleted

## 2011-11-14 DIAGNOSIS — C4491 Basal cell carcinoma of skin, unspecified: Secondary | ICD-10-CM | POA: Insufficient documentation

## 2011-11-18 ENCOUNTER — Ambulatory Visit: Payer: Federal, State, Local not specified - PPO

## 2011-11-18 ENCOUNTER — Ambulatory Visit: Payer: Federal, State, Local not specified - PPO | Admitting: Radiation Oncology

## 2011-12-23 ENCOUNTER — Encounter (HOSPITAL_COMMUNITY): Payer: Self-pay | Admitting: Pharmacy Technician

## 2011-12-24 ENCOUNTER — Encounter (HOSPITAL_COMMUNITY): Payer: Self-pay

## 2011-12-24 ENCOUNTER — Ambulatory Visit (HOSPITAL_COMMUNITY)
Admission: RE | Admit: 2011-12-24 | Discharge: 2011-12-24 | Disposition: A | Discharge status: Home / Self Care | Payer: Federal, State, Local not specified - PPO | Source: Ambulatory Visit | Attending: Urology | Admitting: Urology

## 2011-12-24 ENCOUNTER — Encounter (HOSPITAL_COMMUNITY)
Admission: RE | Admit: 2011-12-24 | Discharge: 2011-12-24 | Disposition: A | Discharge status: Home / Self Care | Payer: Federal, State, Local not specified - PPO | Source: Ambulatory Visit | Attending: Urology | Admitting: Urology

## 2011-12-24 DIAGNOSIS — D179 Benign lipomatous neoplasm, unspecified: Secondary | ICD-10-CM

## 2011-12-24 DIAGNOSIS — C61 Malignant neoplasm of prostate: Secondary | ICD-10-CM | POA: Insufficient documentation

## 2011-12-24 DIAGNOSIS — D174 Benign lipomatous neoplasm of intrathoracic organs: Secondary | ICD-10-CM | POA: Insufficient documentation

## 2011-12-24 DIAGNOSIS — J8410 Pulmonary fibrosis, unspecified: Secondary | ICD-10-CM | POA: Insufficient documentation

## 2011-12-24 DIAGNOSIS — Z01812 Encounter for preprocedural laboratory examination: Secondary | ICD-10-CM | POA: Insufficient documentation

## 2011-12-24 HISTORY — DX: Deviated nasal septum: J34.2

## 2011-12-24 HISTORY — DX: Benign lipomatous neoplasm, unspecified: D17.9

## 2011-12-24 LAB — CBC
HCT: 42.1 % (ref 39.0–52.0)
MCH: 30.8 pg (ref 26.0–34.0)
MCV: 83.5 fL (ref 78.0–100.0)
Platelets: 231 10*3/uL (ref 150–400)
RBC: 5.04 MIL/uL (ref 4.22–5.81)

## 2011-12-24 LAB — BASIC METABOLIC PANEL
BUN: 13 mg/dL (ref 6–23)
Calcium: 9.2 mg/dL (ref 8.4–10.5)
GFR calc Af Amer: >90 mL/min (ref >90)
GFR calc non Af Amer: >90 mL/min (ref >90)
Glucose, Bld: 101 mg/dL — ABNORMAL HIGH (ref 70–99)
Potassium: 3.9 mEq/L (ref 3.5–5.1)
Sodium: 137 mEq/L (ref 135–145)

## 2011-12-24 LAB — SURGICAL PCR SCREEN: MRSA, PCR: NEGATIVE

## 2011-12-24 NOTE — Pre-Procedure Instructions (Addendum)
12-24-11 No EKG required per history and anesthesia guidelines. 12-24-11 CXR results faxed to Dr. Ellin Goodie office.

## 2011-12-24 NOTE — Patient Instructions (Addendum)
20 Nicholas Owen  12/24/2011   Your procedure is scheduled on: 7-31  -2013  Report to [facility] at    0630    AM.  Call this number if you have problems the morning of surgery: [phone]/ or Presurgical testing [phone] days before   Remember:   Do not eat food:After Midnight 12-29-11. No Clear liquids past Midnight 12-30-11.  Follow  clear liquids 24 hours day before drink generously atleast 8 ounces every hour, follow bowel prep instructions per Dr. Ellin Goodie office.  Clear liquids include soda, tea, black coffee, apple or grape juice, broth.  Take these medicines the morning of surgery with A SIP OF WATER: Omeprazole   Do not wear jewelry, make-up or nail polish.  Do not wear lotions, powders, or perfumes. You may wear deodorant.  Do not shave 48 hours prior to surgery.(face and neck okay, no shaving of legs)  Do not bring valuables to the hospital.  Contacts, dentures or bridgework may not be worn into surgery.  Leave suitcase in the car. After surgery it may be brought to your room.  For patients admitted to the hospital, checkout time is 11:00 AM the day of discharge.   Patients discharged the day of surgery will not be allowed to drive home.  Name and phone number of your driver:   Special Instructions: CHG Shower Use Special Wash: 1/2 bottle night before surgery and 1/2 bottle morning of surgery.(avoid face and genitals)   Please read over the following fact sheets that you were given: MRSA Information, Blood Transfusion fact sheet, Incentive Spirometry Instruction.

## 2011-12-30 NOTE — H&P (Signed)
Nicholas Owen is an 66 y.o. male.   Chief Complaint:Prostate cancer for Robotic assisted laparoscopic prostatectomy with bilateral pelvic lymph node dissection.  HPI: Nicholas Owen  has been diagnosed with intermediate risk clinical stage T1c versus potential T2 prostate cancer. I have reviewed his pathology report and notes from Dr. Retta Diones. Clinically there has been no other change. [name] and his wife had been set up for a radiation oncology consult but are considering canceling that since he is finally leaning towards proceeding with robotic surgery. We have at least temporarily put him on the schedule for the end of July in anticipation of a decision to go forward with that. Again, he is here to discuss all of these issues. [name] preoperative SHIM score today is 25/25. His AUA voiding symptom score is 10/1.      History of Present Illness  Past Gu HX: ( from Dr. Retta Diones)   This 66 year old male comes in today with his wife for consultation regarding prostate cancer. Dr. Aldean Ast was his primary urologist up till 2012. When I took over his care, his PSA was over 5. He had subtle induration of his prostate, and at that point I recommended ultrasound and biopsy. His ultrasound and biopsy was performed on 10/03/2011. At that time, prostatic size was 39 cc. There were right-sided hyperechoic areas noted. H./12 biopsies were positive for adenocarcinoma, as follows:  Right base medial, Gleason 3+3, 5% of core Right mid lateral, Gleason 3+4, 80% of core, perineural invasion identified Right mid medial, Gleason 3+4, 80% of core, perineural invasion identified Right apex lateral, Gleason 3+3, 50% of core Right apex medial, Gleason 3+3, 40% of core Left mid lateral, Gleason 3+3, 40% of core Left mid medial, Gleason 3+3, 40% of core Left apex medial, Gleason 3+3, 10% of core  He has minimal voiding symptomatology on doxazosin.     Past Medical History  Diagnosis Date  .  Allergy   . Cancer     basal cell CA (skin)  . GERD (gastroesophageal reflux disease)   . BPH (benign prostatic hyperplasia)   . Hyperlipidemia   . Asbestos exposure   . Diverticulosis   . Tubular adenoma of colon 01/2011    done with colonoscopy  . Colitis 01/2011  . Hiatal hernia 03/2010  . Liver cyst 10/11    4 simple cysts in liver  . Blood clot in vein 12-24-11    hx. rt. leg ,superficial(tx. high dose aspirin)-no problems now  . 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 Basal cell carcinoma     skin of left ear  . Kidney stones   . Lipoma 12-24-11    multiple(present- lipoma 2-lt./4- rt arms/ back/ chest  . Prostate cancer 12-24-11    dx. Prostate cancer after bx.  . Deviated nasal septum 12-24-11    hx. of    Past Surgical History  Procedure Date  . Colonoscopy   . Polypectomy   . Knee surgery 12-24-11    right knee scope  . Tonsillectomy   . Inguinal hernia repair     double  . Lipoma removals 12-24-11    multiple and multiple remains now    Family History  Problem Relation Age of Onset  . Colon cancer Mother   . Colon cancer Father   . Esophageal cancer Neg Hx   . Stomach cancer Neg Hx   . Prostate cancer Paternal Grandfather    Social History:  reports that he has never smoked. He has never used smokeless tobacco. He reports that he drinks alcohol. He reports that he does not use illicit drugs.  Allergies:  Allergies  Allergen Reactions  . Nsaids     colitis    No prescriptions prior to admission    No results found for this or any previous visit (from the past 48 hour(s)). No results found.  Review of Systems - Negative except Mild voiding complaints.  There were no vitals taken for this visit. General appearance: alert, cooperative and no distress Neck: no adenopathy and no JVD Resp: clear to auscultation bilaterally Cardio: regular rate and rhythm GI: soft, non-tender; bowel sounds normal; no masses,  no  organomegaly Male genitalia: normal, penis: no lesions or discharge. testes: no masses or tenderness. no hernias Pelvic: 1+ prostate without nodules. Extremities: extremities normal, atraumatic, no cyanosis or edema Skin: Skin color, texture, turgor normal. 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 PSA 5.15. Based on tan nomogram, he has a 78% chance of having organ confined disease, 2% chance of having seminal vesicle invasion and 2.3% chance of having lymph node involvement. Progression free probability following radical prostatectomy at 5 and 10 years is 90 and 86%, respectively. Progression free probability with brachytherapy is 83% at 5 years.   Plan Health Maintenance (V70.0)  1. UA With REFLEX  Done: 12Jun2013 08:27AM  Discussion/Summary  The patient was counseled about the natural history of prostate cancer and the standard treatment options that are available for prostate cancer. It was explained to him how his age and life expectancy, clinical stage, Gleason score, and PSA affect his prognosis, the decision to proceed with additional staging studies, as well as how that information influences recommended treatment strategies. We discussed the roles for active surveillance, radiation therapy, surgical therapy, androgen deprivation, as well as ablative therapy options for the treatment of prostate cancer as appropriate to his individual cancer situation. We discussed the risks and benefits of these options with regard to their impact on cancer control and also in terms of potential adverse events, complications, and impact on quiality of life particularly related to urinary, bowel, and sexual function. The patient was encouraged to ask questions throughout the discussion today and all questions were answered to his stated satisfaction. In addition, the patient was  provided with and/or directed to appropriate resources and literature for further education about prostate cancer and treatment options.   We discussed surgical therapy for prostate cancer including the different available surgical approaches. We discussed, in detail, the risks and expectations of surgery with regard to cancer control, urinary control, and erectile function as well as the expected postoperative recovery process. The risks, potential complications/adverse events of radical prostatectomy as well as alternative options were explained to the patient.   We discussed surgical therapy for prostate cancer including the different available surgical approaches. We discussed, in detail, the risks and expectations of surgery with regard to cancer control, urinary control, and erectile function as well as the expected postoperative recovery process. Additional risks of surgery including but not limited to bleeding, infection, hernia formation, nerve damage, lymphocele formation, bowel/rectal injury potentially necessitating colostomy, damage to the urinary tract resulting in urine leakage, urethral stricture, and the cardiopulmonary risks such as myocardial infarction, stroke, death, venothromboembolism, etc. were explained. The risk of open surgical conversion for robotic/laparoscopic prostatectomy was also  discussed.   50 minutes were spent in face to face consultation with patient today.    Amendment  At this point Mr. Luellen would like to proceed with a surgical approach. He appears to have a good understanding of the pros and cons of this approach as well as his current disease status. I would propose that we do a left-sided nerve spare with a limited right-sided nerve spare. We will proceed with pelvic lymph node dissection given his higher volume disease, although his risk of lymph node metastasis is probably no higher than 2-3%. He has the preoperative information and will keep his scheduled  appointment for preoperative physical therapy. He will also attend the preoperative class at [facility]. I do feel this would be helpful for him.    Niaya Hickok S 12/30/2011, 1:57 PM

## 2011-12-31 ENCOUNTER — Encounter (HOSPITAL_COMMUNITY): Payer: Self-pay

## 2011-12-31 ENCOUNTER — Encounter (HOSPITAL_COMMUNITY): Payer: Self-pay | Admitting: Anesthesiology

## 2011-12-31 ENCOUNTER — Encounter (HOSPITAL_COMMUNITY)
Admission: RE | Disposition: A | Discharge status: Home / Self Care | Payer: Self-pay | Source: Ambulatory Visit | Attending: Urology

## 2011-12-31 ENCOUNTER — Ambulatory Visit (HOSPITAL_COMMUNITY): Payer: Federal, State, Local not specified - PPO | Admitting: Anesthesiology

## 2011-12-31 ENCOUNTER — Inpatient Hospital Stay (HOSPITAL_COMMUNITY)
Admission: RE | Admit: 2011-12-31 | Discharge: 2012-01-01 | DRG: 335 | Disposition: A | Discharge status: Home / Self Care | Payer: Federal, State, Local not specified - PPO | Source: Ambulatory Visit | Attending: Urology | Admitting: Urology

## 2011-12-31 DIAGNOSIS — E785 Hyperlipidemia, unspecified: Secondary | ICD-10-CM | POA: Diagnosis present

## 2011-12-31 DIAGNOSIS — C61 Malignant neoplasm of prostate: Principal | ICD-10-CM | POA: Diagnosis present

## 2011-12-31 DIAGNOSIS — N400 Enlarged prostate without lower urinary tract symptoms: Secondary | ICD-10-CM | POA: Diagnosis present

## 2011-12-31 DIAGNOSIS — K219 Gastro-esophageal reflux disease without esophagitis: Secondary | ICD-10-CM | POA: Diagnosis present

## 2011-12-31 DIAGNOSIS — Z6830 Body mass index (BMI) 30.0-30.9, adult: Secondary | ICD-10-CM

## 2011-12-31 DIAGNOSIS — K449 Diaphragmatic hernia without obstruction or gangrene: Secondary | ICD-10-CM | POA: Diagnosis present

## 2011-12-31 HISTORY — PX: LYMPH NODE DISSECTION: SHX5087

## 2011-12-31 HISTORY — PX: ROBOT ASSISTED LAPAROSCOPIC RADICAL PROSTATECTOMY: SHX5141

## 2011-12-31 LAB — ABO/RH: ABO/RH(D): B POS

## 2011-12-31 LAB — HEMOGLOBIN AND HEMATOCRIT, BLOOD
HCT: 38.4 % — ABNORMAL LOW (ref 39.0–52.0)
Hemoglobin: 13.5 g/dL (ref 13.0–17.0)

## 2011-12-31 LAB — TYPE AND SCREEN

## 2011-12-31 SURGERY — ROBOTIC ASSISTED LAPAROSCOPIC RADICAL PROSTATECTOMY
Anesthesia: General | Site: Abdomen | Wound class: Clean Contaminated

## 2011-12-31 MED ORDER — HEPARIN SODIUM (PORCINE) 1000 UNIT/ML IJ SOLN
INTRAMUSCULAR | Status: AC
  Filled 2011-12-31: qty 1

## 2011-12-31 MED ORDER — PROPOFOL 10 MG/ML IV BOLUS
INTRAVENOUS | Status: DC | PRN
  Administered 2011-12-31: 150 mg via INTRAVENOUS

## 2011-12-31 MED ORDER — HYDROCODONE-ACETAMINOPHEN 5-325 MG PO TABS: 1.0000 | ORAL_TABLET | Freq: Four times a day (QID) | ORAL | Status: AC | PRN

## 2011-12-31 MED ORDER — PANTOPRAZOLE SODIUM 40 MG PO TBEC
40.0000 mg | DELAYED_RELEASE_TABLET | Freq: Every day | ORAL | Status: DC
  Administered 2011-12-31: 40 mg via ORAL
  Filled 2011-12-31 (×2): qty 1

## 2011-12-31 MED ORDER — SULFASALAZINE 500 MG PO TABS
500.0000 mg | ORAL_TABLET | Freq: Two times a day (BID) | ORAL | Status: DC
  Administered 2011-12-31 – 2012-01-01 (×3): 500 mg via ORAL
  Filled 2011-12-31 (×5): qty 1

## 2011-12-31 MED ORDER — LACTATED RINGERS IV SOLN
INTRAVENOUS | Status: DC | PRN
  Administered 2011-12-31 (×3): via INTRAVENOUS

## 2011-12-31 MED ORDER — CEFAZOLIN SODIUM 1-5 GM-% IV SOLN
1.0000 g | Freq: Three times a day (TID) | INTRAVENOUS | Status: AC
  Administered 2011-12-31 – 2012-01-01 (×2): 1 g via INTRAVENOUS
  Filled 2011-12-31 (×2): qty 50

## 2011-12-31 MED ORDER — MORPHINE SULFATE 2 MG/ML IJ SOLN
2.0000 mg | INTRAMUSCULAR | Status: DC | PRN
  Administered 2012-01-01: 2 mg via INTRAVENOUS
  Filled 2011-12-31 (×2): qty 1

## 2011-12-31 MED ORDER — INDIGOTINDISULFONATE SODIUM 8 MG/ML IJ SOLN
INTRAMUSCULAR | Status: DC | PRN
  Administered 2011-12-31 (×2): 5 mL via INTRAVENOUS

## 2011-12-31 MED ORDER — HYDROMORPHONE HCL PF 1 MG/ML IJ SOLN
INTRAMUSCULAR | Status: AC
  Filled 2011-12-31: qty 1

## 2011-12-31 MED ORDER — ACETAMINOPHEN 10 MG/ML IV SOLN
1000.0000 mg | Freq: Four times a day (QID) | INTRAVENOUS | Status: AC
  Administered 2011-12-31 – 2012-01-01 (×4): 1000 mg via INTRAVENOUS
  Filled 2011-12-31 (×4): qty 100

## 2011-12-31 MED ORDER — CEFAZOLIN SODIUM-DEXTROSE 2-3 GM-% IV SOLR
INTRAVENOUS | Status: AC
  Filled 2011-12-31: qty 50

## 2011-12-31 MED ORDER — ROCURONIUM BROMIDE 100 MG/10ML IV SOLN
INTRAVENOUS | Status: DC | PRN
  Administered 2011-12-31: 5 mg via INTRAVENOUS
  Administered 2011-12-31: 10 mg via INTRAVENOUS
  Administered 2011-12-31: 50 mg via INTRAVENOUS
  Administered 2011-12-31: 10 mg via INTRAVENOUS
  Administered 2011-12-31: 20 mg via INTRAVENOUS

## 2011-12-31 MED ORDER — ACETAMINOPHEN 10 MG/ML IV SOLN
INTRAVENOUS | Status: DC | PRN
  Administered 2011-12-31: 1000 mg via INTRAVENOUS

## 2011-12-31 MED ORDER — SODIUM CHLORIDE 0.9 % IV BOLUS (SEPSIS)
1000.0000 mL | Freq: Once | INTRAVENOUS | Status: AC
  Administered 2011-12-31: 1000 mL via INTRAVENOUS

## 2011-12-31 MED ORDER — DEXTROSE-NACL 5-0.45 % IV SOLN
INTRAVENOUS | Status: DC
  Administered 2011-12-31: 21:00:00 via INTRAVENOUS
  Administered 2011-12-31: 125 mL/h via INTRAVENOUS
  Administered 2012-01-01: 05:00:00 via INTRAVENOUS

## 2011-12-31 MED ORDER — FENTANYL CITRATE 0.05 MG/ML IJ SOLN
INTRAMUSCULAR | Status: DC | PRN
  Administered 2011-12-31 (×4): 50 ug via INTRAVENOUS
  Administered 2011-12-31: 100 ug via INTRAVENOUS
  Administered 2011-12-31 (×4): 50 ug via INTRAVENOUS

## 2011-12-31 MED ORDER — CEFAZOLIN SODIUM-DEXTROSE 2-3 GM-% IV SOLR
2.0000 g | INTRAVENOUS | Status: AC
  Administered 2011-12-31: 2 g via INTRAVENOUS

## 2011-12-31 MED ORDER — BUPIVACAINE-EPINEPHRINE 0.25% -1:200000 IJ SOLN
INTRAMUSCULAR | Status: AC
  Filled 2011-12-31: qty 1

## 2011-12-31 MED ORDER — PROMETHAZINE HCL 25 MG/ML IJ SOLN: 6.2500 mg | INTRAMUSCULAR | Status: DC | PRN

## 2011-12-31 MED ORDER — NEOSTIGMINE METHYLSULFATE 1 MG/ML IJ SOLN
INTRAMUSCULAR | Status: DC | PRN
  Administered 2011-12-31: 4 mg via INTRAVENOUS

## 2011-12-31 MED ORDER — LACTATED RINGERS IV SOLN: INTRAVENOUS | Status: DC

## 2011-12-31 MED ORDER — ROCURONIUM BROMIDE 100 MG/10ML IV SOLN: INTRAVENOUS | Status: DC | PRN

## 2011-12-31 MED ORDER — MIDAZOLAM HCL 5 MG/5ML IJ SOLN
INTRAMUSCULAR | Status: DC | PRN
  Administered 2011-12-31: 2 mg via INTRAVENOUS

## 2011-12-31 MED ORDER — LACTATED RINGERS IV SOLN
INTRAVENOUS | Status: DC | PRN
  Administered 2011-12-31: 10:00:00

## 2011-12-31 MED ORDER — BUPIVACAINE-EPINEPHRINE 0.25% -1:200000 IJ SOLN
INTRAMUSCULAR | Status: DC | PRN
  Administered 2011-12-31: 20 mL

## 2011-12-31 MED ORDER — GLYCOPYRROLATE 0.2 MG/ML IJ SOLN
INTRAMUSCULAR | Status: DC | PRN
  Administered 2011-12-31: 0.4 mg via INTRAVENOUS
  Administered 2011-12-31: .6 mg via INTRAVENOUS

## 2011-12-31 MED ORDER — HYDROMORPHONE HCL PF 1 MG/ML IJ SOLN
0.2500 mg | INTRAMUSCULAR | Status: DC | PRN
  Administered 2011-12-31 (×4): 0.5 mg via INTRAVENOUS

## 2011-12-31 MED ORDER — ACETAMINOPHEN 10 MG/ML IV SOLN
INTRAVENOUS | Status: AC
  Filled 2011-12-31: qty 100

## 2011-12-31 MED ORDER — ONDANSETRON HCL 4 MG/2ML IJ SOLN
INTRAMUSCULAR | Status: DC | PRN
  Administered 2011-12-31: 4 mg via INTRAVENOUS

## 2011-12-31 MED ORDER — SODIUM CHLORIDE 0.9 % IR SOLN
Status: DC | PRN
  Administered 2011-12-31: 300 mL via INTRAVESICAL

## 2011-12-31 MED ORDER — SUCCINYLCHOLINE CHLORIDE 20 MG/ML IJ SOLN
INTRAMUSCULAR | Status: DC | PRN
  Administered 2011-12-31: 100 mg via INTRAVENOUS

## 2011-12-31 MED ORDER — DEXAMETHASONE SODIUM PHOSPHATE 10 MG/ML IJ SOLN
INTRAMUSCULAR | Status: DC | PRN
  Administered 2011-12-31: 10 mg via INTRAVENOUS

## 2011-12-31 MED ORDER — VITAMINS A & D EX OINT
TOPICAL_OINTMENT | CUTANEOUS | Status: AC
  Administered 2011-12-31: 14:00:00
  Filled 2011-12-31: qty 5

## 2011-12-31 MED ORDER — LACTATED RINGERS IV SOLN
INTRAVENOUS | Status: DC
  Administered 2011-12-31: 1000 mL via INTRAVENOUS

## 2011-12-31 MED ORDER — CIPROFLOXACIN HCL 500 MG PO TABS: 500.0000 mg | ORAL_TABLET | Freq: Two times a day (BID) | ORAL | Status: AC

## 2011-12-31 MED ORDER — HYDROCODONE-ACETAMINOPHEN 5-325 MG PO TABS
1.0000 | ORAL_TABLET | ORAL | Status: DC | PRN
  Administered 2012-01-01 (×2): 2 via ORAL
  Administered 2012-01-01: 1 via ORAL
  Filled 2011-12-31: qty 1
  Filled 2011-12-31 (×2): qty 2

## 2011-12-31 MED ORDER — INDIGOTINDISULFONATE SODIUM 8 MG/ML IJ SOLN
INTRAMUSCULAR | Status: AC
  Filled 2011-12-31: qty 10

## 2011-12-31 SURGICAL SUPPLY — 47 items
APL ESCP 34 STRL LF DISP (HEMOSTASIS) ×2
APPLICATOR SURGIFLO ENDO (HEMOSTASIS) ×1 IMPLANT
CANISTER SUCTION 2500CC (MISCELLANEOUS) ×3 IMPLANT
CATH FOLEY 2WAY SLVR  5CC 20FR (CATHETERS) ×1
CATH FOLEY 2WAY SLVR 5CC 20FR (CATHETERS) ×2 IMPLANT
CATH ROBINSON RED A/P 8FR (CATHETERS) ×3 IMPLANT
CHLORAPREP W/TINT 26ML (MISCELLANEOUS) ×3 IMPLANT
CLIP LIGATING HEM O LOK PURPLE (MISCELLANEOUS) ×10 IMPLANT
CLOTH BEACON ORANGE TIMEOUT ST (SAFETY) ×3 IMPLANT
CORD HIGH FREQUENCY UNIPOLAR (ELECTROSURGICAL) ×3 IMPLANT
COVER SURGICAL LIGHT HANDLE (MISCELLANEOUS) ×3 IMPLANT
COVER TIP SHEARS 8 DVNC (MISCELLANEOUS) ×2 IMPLANT
COVER TIP SHEARS 8MM DA VINCI (MISCELLANEOUS) ×1
CUTTER ECHEON FLEX ENDO 45 340 (ENDOMECHANICALS) ×3 IMPLANT
DECANTER SPIKE VIAL GLASS SM (MISCELLANEOUS) ×3 IMPLANT
DRAPE SURG IRRIG POUCH 19X23 (DRAPES) ×3 IMPLANT
DRAPE UTILITY 15X26 (DRAPE) ×3 IMPLANT
DRSG TEGADERM 2-3/8X2-3/4 SM (GAUZE/BANDAGES/DRESSINGS) ×5 IMPLANT
DRSG TEGADERM 4X4.75 (GAUZE/BANDAGES/DRESSINGS) ×1 IMPLANT
DRSG TEGADERM 6X8 (GAUZE/BANDAGES/DRESSINGS) ×9 IMPLANT
ELECT REM PT RETURN 9FT ADLT (ELECTROSURGICAL) ×3
ELECTRODE REM PT RTRN 9FT ADLT (ELECTROSURGICAL) ×2 IMPLANT
GLOVE BIO SURGEON STRL SZ 6.5 (GLOVE) ×6 IMPLANT
GLOVE BIOGEL M STRL SZ7.5 (GLOVE) ×3 IMPLANT
GOWN PREVENTION PLUS XLARGE (GOWN DISPOSABLE) ×3 IMPLANT
GOWN STRL NON-REIN LRG LVL3 (GOWN DISPOSABLE) ×3 IMPLANT
GOWN STRL REIN XL XLG (GOWN DISPOSABLE) ×3 IMPLANT
HEMOSTAT SURGICEL 4X8 (HEMOSTASIS) ×1 IMPLANT
HOLDER FOLEY CATH W/STRAP (MISCELLANEOUS) ×3 IMPLANT
IV LACTATED RINGERS 1000ML (IV SOLUTION) ×3 IMPLANT
KIT ACCESSORY DA VINCI DISP (KITS) ×1
KIT ACCESSORY DVNC DISP (KITS) ×2 IMPLANT
NDL SAFETY ECLIPSE 18X1.5 (NEEDLE) ×2 IMPLANT
NEEDLE HYPO 18GX1.5 SHARP (NEEDLE) ×3
PACK ROBOT UROLOGY CUSTOM (CUSTOM PROCEDURE TRAY) ×3 IMPLANT
RELOAD GREEN ECHELON 45 (STAPLE) ×3 IMPLANT
SEALER TISSUE G2 CVD JAW 45CM (ENDOMECHANICALS) ×1 IMPLANT
SET TUBE IRRIG SUCTION NO TIP (IRRIGATION / IRRIGATOR) ×3 IMPLANT
SOLUTION ELECTROLUBE (MISCELLANEOUS) ×3 IMPLANT
SPONGE GAUZE 4X4 12PLY (GAUZE/BANDAGES/DRESSINGS) ×3 IMPLANT
SURGIFLO W/THROMBIN 8M KIT (HEMOSTASIS) ×2 IMPLANT
SUT VIC AB 2-0 SH 27 (SUTURE) ×3
SUT VIC AB 2-0 SH 27X BRD (SUTURE) ×2 IMPLANT
SUT VICRYL 0 UR6 27IN ABS (SUTURE) ×3 IMPLANT
SYR 27GX1/2 1ML LL SAFETY (SYRINGE) ×3 IMPLANT
TOWEL OR NON WOVEN STRL DISP B (DISPOSABLE) ×3 IMPLANT
WATER STERILE IRR 1500ML POUR (IV SOLUTION) ×6 IMPLANT

## 2011-12-31 NOTE — Anesthesia Postprocedure Evaluation (Signed)
Anesthesia Post Note  Patient: Nicholas Owen  Procedure(s) Performed: Procedure(s) (LRB): ROBOTIC ASSISTED LAPAROSCOPIC RADICAL PROSTATECTOMY (N/A) LYMPH NODE DISSECTION (Bilateral)  Anesthesia type: General  Patient location: PACU  Post pain: Pain level controlled  Post assessment: Post-op Vital signs reviewed  Last Vitals:  Filed Vitals:   12/31/11 1200  BP:   Pulse:   Temp: 36.3 C  Resp:     Post vital signs: Reviewed  Level of consciousness: sedated  Complications: No apparent anesthesia complications

## 2011-12-31 NOTE — Interval H&P Note (Signed)
History and Physical Interval Note:  12/31/2011 8:14 AM  Nicholas Owen  has presented today for surgery, with the diagnosis of Prostate Cancer  The various methods of treatment have been discussed with the patient and family. After consideration of risks, benefits and other options for treatment, the patient has consented to  Procedure(s) (LRB): ROBOTIC ASSISTED LAPAROSCOPIC RADICAL PROSTATECTOMY (N/A) LYMPH NODE DISSECTION (Bilateral) as a surgical intervention .  [name] history has been reviewed, patient examined, no change in status, stable for surgery.  I have reviewed the patient's chart and labs.  Questions were answered to the patient's satisfaction.     Meeyah Ovitt S

## 2011-12-31 NOTE — Transfer of Care (Signed)
Immediate Anesthesia Transfer of Care Note  Patient: [name]  Procedure(s) Performed: Procedure(s) (LRB): ROBOTIC ASSISTED LAPAROSCOPIC RADICAL PROSTATECTOMY (N/A) LYMPH NODE DISSECTION (Bilateral)  Patient Location: PACU  Anesthesia Type: General  Level of Consciousness: awake, alert  and patient cooperative  Airway & Oxygen Therapy: Patient Spontanous Breathing and Patient connected to face mask oxygen  Post-op Assessment: Report given to PACU RN and Post -op Vital signs reviewed and stable  Post vital signs: Reviewed and stable  Complications: No apparent anesthesia complications

## 2011-12-31 NOTE — Preoperative (Signed)
Beta Blockers   Reason not to administer Beta Blockers:Not Applicable 

## 2011-12-31 NOTE — Progress Notes (Signed)
   CARE MANAGEMENT NOTE 12/31/2011  Patient:  Nicholas Owen, Nicholas Owen   Account Number:  [id number]  Date Initiated:  12/31/2011  Documentation initiated by:  Jiles Crocker  Subjective/Objective Assessment:   ADMITTED FOR Prostate cancer for Robotic assisted laparoscopic prostatectomy with bilateral pelvic lymph node dissection.     Action/Plan:   LIVES AT HOME WITH SPOUSE; INDEPENDENT PRIOR TO ADMISSION   Anticipated DC Date:  01/02/2012   Anticipated DC Plan:  HOME/SELF CARE      DC Planning Services  CM consult               Status of service:  In process, will continue to follow Medicare Important Message given?  NA - LOS <3 / Initial given by admissions (If response is "NO", the following Medicare IM given date fields will be blank)  Per UR Regulation:  Reviewed for med. necessity/level of care/duration of stay  Comments:  7/31/2013[facility] RN, BSN, MHA

## 2011-12-31 NOTE — Anesthesia Preprocedure Evaluation (Addendum)
Anesthesia Evaluation  Patient identified by MRN, date of birth, ID band Patient awake    Reviewed: Allergy & Precautions, H&P , NPO status , Patient's Chart, lab work & pertinent test results  Airway Mallampati: II TM Distance: >3 FB Neck ROM: Full    Dental  (+) Teeth Intact and Dental Advisory Given   Pulmonary neg pulmonary ROS,  breath sounds clear to auscultation  Pulmonary exam normal       Cardiovascular negative cardio ROS  Rate:Normal     Neuro/Psych negative psych ROS   GI/Hepatic Neg liver ROS, hiatal hernia, GERD-  Medicated,  Endo/Other  negative endocrine ROSMorbid obesity  Renal/GU negative Renal ROS   Prostate cancer negative genitourinary   Musculoskeletal negative musculoskeletal ROS (+)   Abdominal   Peds  Hematology negative hematology ROS (+)   Anesthesia Other Findings   Reproductive/Obstetrics negative OB ROS                          Anesthesia Physical Anesthesia Plan  ASA: II  Anesthesia Plan: General   Post-op Pain Management:    Induction: Intravenous  Airway Management Planned: Oral ETT  Additional Equipment:   Intra-op Plan:   Post-operative Plan: Extubation in OR  Informed Consent: I have reviewed the patients History and Physical, chart, labs and discussed the procedure including the risks, benefits and alternatives for the proposed anesthesia with the patient or authorized representative who has indicated his/her understanding and acceptance.   Dental advisory given  Plan Discussed with: CRNA  Anesthesia Plan Comments:         Anesthesia Quick Evaluation

## 2011-12-31 NOTE — Op Note (Signed)
Preoperative diagnosis: Clinical stage T1c/ possible T2b Adenocarcinoma prostate  Postoperative diagnosis: Same  Procedure: Robotic-assisted laparoscopic radical retropubic prostatectomy with bilateral pelvic lymph node dissection  Surgeon: Valetta Fuller, MD  Asst.: Pecola Leisure, PA Anesthesia: Gen. Endotracheal  Indications: Patient was diagnosed with clinical stage TIc Adenocarcinoma the prostate. He underwent extensive consultation with regard to treatment options. The patient decided on a surgical approach. He appeared to understand the distinct advantages as well as the disadvantages of this procedure. The patient has performed a mechanical bowel prep. He has had placement of PAS compression boots and has received perioperative antibiotics. [name] preoperative PSA was 5.1. Ultrasound revealed a 39 g prostate.  Full cancer details in H and P. Technique and findings:The patient was brought to the operating room and had successful induction of general endotracheal anesthesia.the patient was placed in a low lithotomy position with careful padding of all extremities. He was secured to the operative table and placed in the steep Trendelenburg position. He was prepped and draped in usual manner. A Foley catheter was placed sterilely on the field. Camera port site was chosen 18 cm above the pubic symphysis just to the left of the umbilicus. A standard open Hassan technique was utilized. A 12 mm trocar was placed without difficulty. The camera was then inserted and no abnormalities were noted within the pelvis. The trochars were placed with direct visual guidance. This included 3 8mm robotic trochars and a 12 mm and 5 mm assist ports. Once all the ports were placed the robot was docked. The bladder was filled and the space of Retzius was developed with electrocautery dissection as well as blunt dissection. Superficial fat off the endopelvic fascia and bladder neck was removed with electrocautery  scissors. The endopelvic fascia was then incised bilaterally from base to apex. Levator musculature was swept off the apex of the prostate isolating the dorsal venous complex which was then stapled with the ETS stapling device. The anterior bladder neck was identified with the aid of the Foley balloon. This was then transected down to the Foley catheter with electrocautery scissors. The Foley catheter was then retracted anteriorly. Indigo carmine was given and we appeared to be well away from the ureteral orifices. The posterior bladder neck was then transected and the dissection carried down to the adnexal structures. The seminal vesicles and vas deferens on both sides were then individually dissected free and retracted anteriorly. The posterior plane between the rectum and prostate was then established primarily with blunt dissection.  Attention was then turned towards nerve sparing. The patient was felt to be a candidate for left-sided nerve sparing and limited right-sided nerve sparing. Superficial fascia along the anterior lateral aspect of the prostate was incised bilaterally. This tissue was then swept laterally until we were able to establish a groove between the neurovascular tissue and the posterior lateral aspect on the prostate bilaterally. This groove was then extended from the apex back to the base of the prostate. With the prostate retracted anteriorly the vascular pedicles of the prostate were taken with the Enseal device. The Foley catheter was then reinserted and the anterior urethra was transected. The posterior urethra was then transected as were some rectourethralis fibers. The prostate was then removed from the pelvis. The pelvis was then copiously irrigated. Rectal insufflation was performed and there was no evidence of rectal injury.  Attention was then turned towards bilateral pelvic lymph node dissection. The obturator node packets were removed I laterally and the dissection extended  towards the bifurcation of the iliac artery. The obturator nerve was identified on both sides and preserved. Hemalock clips were used for small veins and lymphatic channels. The node packets were sent for permanent analysis.  Attention was then turned towards reconstruction. The bladder neck did not require any reconstruction. The bladder neck and posterior urethra were reapproximated at the 6:00 position utilizing a 2-0 Vicryl suture. The rest of the anastomosis was done with a double-armed 3-0 Monocryl suture in a 360 degree manner. Additional indigo carmine was given. A new catheter was placed and bladder irrigation revealed no evidence of leakage. A Blake drain was placed through one of the robotic trochars and positioned in the retropubic space above the anastomosis. This was then secured to the skin with a nylon suture. The prostate was placed in the Endopouch retrieval bag. The 12 mm trocar site was closed with a Vicryl suture with the aid of a suture passer. Our other trochars were taken out with direct visual guidance without evidence of any bleeding. The camera port incision was extended slightly to allow for removal of the specimen and then closed with a running Vicryl suture. All port sites were infiltrated with Marcaine and then closed with surgical clips. The patient was then taken to recovery room having had no obvious complications or problems. Sponge and needle counts were correct.

## 2012-01-01 ENCOUNTER — Encounter (HOSPITAL_COMMUNITY): Payer: Self-pay | Admitting: Urology

## 2012-01-01 LAB — HEMOGLOBIN AND HEMATOCRIT, BLOOD
HCT: 36.1 % — ABNORMAL LOW (ref 39.0–52.0)
Hemoglobin: 12.8 g/dL — ABNORMAL LOW (ref 13.0–17.0)

## 2012-01-01 LAB — BASIC METABOLIC PANEL
Calcium: 8.1 mg/dL — ABNORMAL LOW (ref 8.4–10.5)
Creatinine, Ser: 0.85 mg/dL (ref 0.50–1.35)
GFR calc non Af Amer: >90 mL/min (ref >90)
Sodium: 137 mEq/L (ref 135–145)

## 2012-01-01 MED ORDER — BISACODYL 10 MG RE SUPP
10.0000 mg | Freq: Once | RECTAL | Status: AC
  Administered 2012-01-01: 10 mg via RECTAL
  Filled 2012-01-01: qty 1

## 2012-01-01 NOTE — Progress Notes (Signed)
1 Day Post-Op Subjective: Patient reports pain control good.  Denies N/V.  Amb well last pm  Objective: Vital signs in last 24 hours: Temp:  [96.4 F (35.8 C)-98 F (36.7 C)] 97.6 F (36.4 C) (08/01 0540) Pulse Rate:  [47-55] 47  (08/01 0540) Resp:  [8-15] 15  (08/01 0540) BP: (92-148)/(50-84) 92/50 mmHg (08/01 0540) SpO2:  [94 %-100 %] 94 % (08/01 0540) Weight:  [103 kg (227 lb 1.2 oz)] 103 kg (227 lb 1.2 oz) (07/31 1300)  Intake/Output from previous day: 07/31 0701 - 08/01 0700 In: 6417.1 [P.O.:840; I.V.:4327.1; IV Piggyback:1250] Out: 3523 [Urine:3150; Drains:173; Blood:200] Intake/Output this shift:    Physical Exam:  General:alert, cooperative and no distress Cardiovascular: RRR Lungs: CTA GI: soft, non tender, normal bowel sounds, no palpable masses Incisions: dressings C/D/I Urine: yellow Extremities: warm with SCDs  Lab Results:  Basename 01/01/12 0449 12/31/11 1150  HGB 12.8* 13.5  HCT 36.1* 38.4*   BMET  Basename 01/01/12 0449  NA 137  K 4.1  CL 105  CO2 26  GLUCOSE 139*  BUN 9  CREATININE 0.85  CALCIUM 8.1*   No results found for this basename: LABPT:3,INR:3 in the last 72 hours No results found for this basename: LABURIN:1 in the last 72 hours Results for orders placed during the hospital encounter of 12/24/11  SURGICAL PCR SCREEN     Status: Normal   Collection Time   12/24/11  9:55 AM      Component Value Range Status Comment   MRSA, PCR NEGATIVE  NEGATIVE Final    Staphylococcus aureus NEGATIVE  NEGATIVE Final     Studies/Results: No results found.  Assessment/Plan: 1 Day Post-Op, Procedure(s) (LRB): ROBOTIC ASSISTED LAPAROSCOPIC RADICAL PROSTATECTOMY (N/A) LYMPH NODE DISSECTION (Bilateral)  Ambulate, Incentive spirometry DVT prophylaxis Transition to PO pain medications SL IVF Likely d/c JP this morning Dulcolax supp Poss d/c home later today    LOS: 1 day   YARBROUGH,Yancy Knoble G. 01/01/2012, 7:23 AM

## 2012-01-01 NOTE — Discharge Summary (Signed)
  Date of admission: 12/31/2011  Date of discharge: 01/01/2012  Admission diagnosis: Prostate Cancer  Discharge diagnosis: Prostate Cancer  History and Physical: For full details, please see admission history and physical. Briefly, Nicholas Owen is a 66 y.o. gentleman with localized prostate cancer.  After discussing management/treatment options, he elected to proceed with surgical treatment.  Hospital Course: Nicholas Owen was taken to the operating room on 12/31/2011 and underwent a robotic assisted laparoscopic radical prostatectomy. He tolerated this procedure well and without complications. Postoperatively, he was able to be transferred to a regular hospital room following recovery from anesthesia.  He was able to begin ambulating the night of surgery. He remained hemodynamically stable overnight.  He had excellent urine output with appropriately minimal output from his pelvic drain and his pelvic drain was removed on POD #1.  He was transitioned to oral pain medication, tolerated a clear liquid diet, and had met all discharge criteria and was able to be discharged home later on POD#1.  Laboratory values:  Basename 01/01/12 0449 12/31/11 1150  HGB 12.8* 13.5  HCT 36.1* 38.4*    Disposition: Home  Discharge instruction: He was instructed to be ambulatory but to refrain from heavy lifting, strenuous activity, or driving. He was instructed on urethral catheter care.  Discharge medications:   Medication List  As of 01/01/2012  2:57 PM   START taking these medications         ciprofloxacin 500 MG tablet   Commonly known as: CIPRO   Take 1 tablet (500 mg total) by mouth 2 (two) times daily. Start day prior to office visit for foley removal      HYDROcodone-acetaminophen 5-325 MG per tablet   Commonly known as: NORCO/VICODIN   Take 1-2 tablets by mouth every 6 (six) hours as needed for pain.         CONTINUE taking these medications         omeprazole 20 MG capsule   Commonly  known as: PRILOSEC      oxymetazoline 0.05 % nasal spray   Commonly known as: AFRIN      sulfaSALAzine 500 MG tablet   Commonly known as: AZULFIDINE         STOP taking these medications         multivitamin with minerals Tabs      OVER THE COUNTER MEDICATION      Tamsulosin HCl 0.4 MG Caps          Where to get your medications    These are the prescriptions that you need to pick up.   You may get these medications from any pharmacy.         ciprofloxacin 500 MG tablet   HYDROcodone-acetaminophen 5-325 MG per tablet            Followup: He will followup in 1 week for catheter removal and to discuss his surgical pathology results.

## 2012-02-04 ENCOUNTER — Other Ambulatory Visit: Payer: Self-pay | Admitting: Urology

## 2012-02-06 ENCOUNTER — Other Ambulatory Visit: Payer: Self-pay | Admitting: Internal Medicine

## 2012-02-06 MED ORDER — SULFASALAZINE 500 MG PO TABS: 500.0000 mg | ORAL_TABLET | Freq: Two times a day (BID) | ORAL | Status: DC

## 2012-02-06 NOTE — Telephone Encounter (Signed)
rx sent

## 2012-02-11 ENCOUNTER — Encounter (HOSPITAL_BASED_OUTPATIENT_CLINIC_OR_DEPARTMENT_OTHER): Payer: Self-pay | Admitting: *Deleted

## 2012-02-11 NOTE — Progress Notes (Signed)
To [facility] at 0730  Hg on arrival-Npo after Mn

## 2012-02-13 NOTE — H&P (Signed)
  History of Present Illness  Outpatient surgery today for cystoscopy and removal of foreign body. From the bladder.  Nicholas Owen presents today as a walk-in. He has contacted [place] several times complaining of a burning sensation throughout his penis. It has really not been a true dysuria in that the act of urinating does not seem to make this much worse. He has had a fairly constant burning feeling throughout his penis. He is starting to retain a bit of urine and his stream has been reasonable. He does obviously have ongoing moderate to severe incontinence but is only 5 weeks status post his surgery. Again, his pathology was favorable but he is not yet due for his initial PSA testing. He otherwise has been recovering reasonably well. He was given a prescription for some Pyridium and also some VESIcare to help with some bladder overactivity. The patient presents now for additional assessment. Of note, his burning sensation is worse when he sits for periods of time and is much better if he lays flat. [name] most recent urine culture from a few days was negative.    Vitals Vital Signs [Data Includes: Last 1 Day]  03Sep2013 02:58PM  Blood Pressure: 120 / 83 Temperature: 97.9 F Heart Rate: 83  Well-developed well-nourished male in no acute distress Respiratory: Normal effort Cardiac: Regular rate and rhythm Abdomen: Soft and nontender Extremities: No tenderness or edema.  Results/Data Urine [Data Includes: Last 1 Day]   03Sep2013 COLOR YELLOW  APPEARANCE CLEAR  SPECIFIC GRAVITY 1.025  pH 5.5  GLUCOSE NEG mg/dL BILIRUBIN NEG  KETONE NEG mg/dL BLOOD NEG  PROTEIN TRACE mg/dL UROBILINOGEN 0.2 mg/dL NITRITE NEG  LEUKOCYTE ESTERASE NEG   Procedure  Procedure: Cystoscopy   Indication: Lower Urinary Tract Symptoms.  Informed Consent: Risks, benefits, and potential adverse events were discussed and informed consent was obtained. Specific risks including, but not limited to bleeding,  infection, pain, allergic reaction etc. were explained.  Prep: The patient was prepped with betadine.  Anesthesia:. Local anesthesia was administered intraurethrally with 2% lidocaine jelly.  Procedure Note:  Urethral meatus:. No abnormalities.  Anterior urethra: No abnormalities.  Bladder: staple The patient tolerated the procedure well.    Assessment Assessed  1. Dysuria 788.1 2. Prostate Cancer 185  Plan Dysuria (788.1)  1. Cysto  Done: 03Sep2013 Health Maintenance (V70.0)  2. UA With REFLEX  Done: 03Sep2013 02:37PM  Discussion/Summary   Nicholas Owen on cystoscopy does appear to have a well coapting sphincter without obvious defect. Right at the bladder neck anteriorly there is one and possibly two staples that were used for the vascular stapling for his dorsal vein. They do not have any calcification on them. This certainly could be expected to cause some true dysuria but I am not sure it is causing his penile burning sensation. These staples could potentially come out on their own and may not cause him any problems whatsoever, but I think given his ongoing symptoms, the prudent thing to do would be to perform a flexible cystoscopy under a bit of anesthesia and to remove these with a grasper. I would anticipate being able to do this with some IV sedation and some lidocaine jelly in the course of hopefully just a few minutes. Then we will hope the dysuria and discomfort resolves and if not, then we will have to address other issues.       Signatures Electronically signed by : Barron Alvine, M.D.; Feb 04 2012 12:43PM

## 2012-02-16 ENCOUNTER — Encounter (HOSPITAL_BASED_OUTPATIENT_CLINIC_OR_DEPARTMENT_OTHER)
Admission: RE | Disposition: A | Discharge status: Home / Self Care | Payer: Self-pay | Source: Ambulatory Visit | Attending: Urology

## 2012-02-16 ENCOUNTER — Encounter (HOSPITAL_BASED_OUTPATIENT_CLINIC_OR_DEPARTMENT_OTHER): Payer: Self-pay | Admitting: Anesthesiology

## 2012-02-16 ENCOUNTER — Encounter (HOSPITAL_BASED_OUTPATIENT_CLINIC_OR_DEPARTMENT_OTHER): Payer: Self-pay | Admitting: *Deleted

## 2012-02-16 ENCOUNTER — Ambulatory Visit (HOSPITAL_BASED_OUTPATIENT_CLINIC_OR_DEPARTMENT_OTHER)
Admission: RE | Admit: 2012-02-16 | Discharge: 2012-02-16 | Disposition: A | Discharge status: Home / Self Care | Payer: Federal, State, Local not specified - PPO | Source: Ambulatory Visit | Attending: Urology | Admitting: Urology

## 2012-02-16 ENCOUNTER — Ambulatory Visit (HOSPITAL_BASED_OUTPATIENT_CLINIC_OR_DEPARTMENT_OTHER): Payer: Federal, State, Local not specified - PPO | Admitting: Anesthesiology

## 2012-02-16 DIAGNOSIS — M795 Residual foreign body in soft tissue: Secondary | ICD-10-CM | POA: Insufficient documentation

## 2012-02-16 DIAGNOSIS — C61 Malignant neoplasm of prostate: Secondary | ICD-10-CM | POA: Insufficient documentation

## 2012-02-16 DIAGNOSIS — R300 Dysuria: Secondary | ICD-10-CM | POA: Insufficient documentation

## 2012-02-16 DIAGNOSIS — Y838 Other surgical procedures as the cause of abnormal reaction of the patient, or of later complication, without mention of misadventure at the time of the procedure: Secondary | ICD-10-CM | POA: Insufficient documentation

## 2012-02-16 DIAGNOSIS — Z1810 Retained metal fragments, unspecified: Secondary | ICD-10-CM | POA: Insufficient documentation

## 2012-02-16 HISTORY — PX: CYSTOSCOPY: SHX5120

## 2012-02-16 LAB — POCT HEMOGLOBIN-HEMACUE: Hemoglobin: 14.5 g/dL (ref 13.0–17.0)

## 2012-02-16 SURGERY — CYSTOSCOPY, FLEXIBLE
Anesthesia: General | Site: Bladder | Wound class: Clean Contaminated

## 2012-02-16 MED ORDER — CEFAZOLIN SODIUM 1-5 GM-% IV SOLN: 1.0000 g | INTRAVENOUS | Status: DC

## 2012-02-16 MED ORDER — FENTANYL CITRATE 0.05 MG/ML IJ SOLN
INTRAMUSCULAR | Status: DC | PRN
  Administered 2012-02-16 (×2): 50 ug via INTRAVENOUS

## 2012-02-16 MED ORDER — FENTANYL CITRATE 0.05 MG/ML IJ SOLN
25.0000 ug | INTRAMUSCULAR | Status: DC | PRN
  Administered 2012-02-16 (×2): 25 ug via INTRAVENOUS

## 2012-02-16 MED ORDER — CEFAZOLIN SODIUM-DEXTROSE 2-3 GM-% IV SOLR
2.0000 g | INTRAVENOUS | Status: AC
  Administered 2012-02-16: 2 g via INTRAVENOUS

## 2012-02-16 MED ORDER — SODIUM CHLORIDE 0.9 % IR SOLN
Status: DC | PRN
  Administered 2012-02-16: 3000 mL via INTRAVESICAL

## 2012-02-16 MED ORDER — PROMETHAZINE HCL 25 MG/ML IJ SOLN: 6.2500 mg | INTRAMUSCULAR | Status: DC | PRN

## 2012-02-16 MED ORDER — ONDANSETRON HCL 4 MG/2ML IJ SOLN
INTRAMUSCULAR | Status: DC | PRN
  Administered 2012-02-16: 4 mg via INTRAVENOUS

## 2012-02-16 MED ORDER — LIDOCAINE HCL 2 % EX GEL
CUTANEOUS | Status: DC | PRN
  Administered 2012-02-16: 1

## 2012-02-16 MED ORDER — LACTATED RINGERS IV SOLN
INTRAVENOUS | Status: DC
  Administered 2012-02-16 (×2): via INTRAVENOUS

## 2012-02-16 MED ORDER — PROPOFOL 10 MG/ML IV BOLUS
INTRAVENOUS | Status: DC | PRN
  Administered 2012-02-16: 220 mg via INTRAVENOUS

## 2012-02-16 MED ORDER — LIDOCAINE HCL (CARDIAC) 20 MG/ML IV SOLN
INTRAVENOUS | Status: DC | PRN
  Administered 2012-02-16: 75 mg via INTRAVENOUS

## 2012-02-16 MED ORDER — DEXAMETHASONE SODIUM PHOSPHATE 4 MG/ML IJ SOLN
INTRAMUSCULAR | Status: DC | PRN
  Administered 2012-02-16: 10 mg via INTRAVENOUS

## 2012-02-16 SURGICAL SUPPLY — 21 items
ADAPTER CATH URET PLST 4-6FR (CATHETERS) IMPLANT
ADPR CATH URET STRL DISP 4-6FR (CATHETERS)
BAG DRAIN URO-CYSTO SKYTR STRL (DRAIN) ×2 IMPLANT
BAG DRN UROCATH (DRAIN) ×1
CANISTER SUCT LVC 12 LTR MEDI- (MISCELLANEOUS) ×1 IMPLANT
CATH INTERMIT  6FR 70CM (CATHETERS) IMPLANT
CATH URET 5FR 28IN CONE TIP (BALLOONS)
CATH URET 5FR 28IN OPEN ENDED (CATHETERS) IMPLANT
CATH URET 5FR 70CM CONE TIP (BALLOONS) IMPLANT
CLOTH BEACON ORANGE TIMEOUT ST (SAFETY) ×2 IMPLANT
DRAPE CAMERA CLOSED 9X96 (DRAPES) IMPLANT
GLOVE BIO SURGEON STRL SZ7.5 (GLOVE) ×2 IMPLANT
GLOVE ECLIPSE 7.0 STRL STRAW (GLOVE) ×1 IMPLANT
GLOVE INDICATOR 7.0 STRL GRN (GLOVE) ×1 IMPLANT
GOWN STRL REIN XL XLG (GOWN DISPOSABLE) ×2 IMPLANT
GOWN SURGICAL LARGE (GOWNS) ×1 IMPLANT
GUIDEWIRE 0.038 PTFE COATED (WIRE) ×1 IMPLANT
GUIDEWIRE ANG ZIPWIRE 038X150 (WIRE) IMPLANT
NS IRRIG 500ML POUR BTL (IV SOLUTION) IMPLANT
PACK CYSTOSCOPY (CUSTOM PROCEDURE TRAY) ×2 IMPLANT
WATER STERILE IRR 3000ML UROMA (IV SOLUTION) ×2 IMPLANT

## 2012-02-16 NOTE — Interval H&P Note (Signed)
History and Physical Interval Note:  02/16/2012 8:35 AM  Nicholas Owen  has presented today for surgery, with the diagnosis of DYSURIA, FOREIGN BODY IN URETHRA  The various methods of treatment have been discussed with the patient and family. After consideration of risks, benefits and other options for treatment, the patient has consented to  Procedure(s) (LRB) with comments: CYSTOSCOPY FLEXIBLE (N/A) - 30 MIN FLEX CYSTO WITH REMOVAL OF STAPLE  as a surgical intervention .  [name] history has been reviewed, patient examined, no change in status, stable for surgery.  I have reviewed the patient's chart and labs.  Questions were answered to the patient's satisfaction.     Srihith Aquilino S

## 2012-02-16 NOTE — Anesthesia Preprocedure Evaluation (Signed)
Anesthesia Evaluation  Patient identified by MRN, date of birth, ID band Patient awake    Reviewed: Allergy & Precautions, H&P , NPO status , Patient's Chart, lab work & pertinent test results  Airway Mallampati: II TM Distance: >3 FB Neck ROM: Full    Dental No notable dental hx.    Pulmonary neg pulmonary ROS,  breath sounds clear to auscultation  Pulmonary exam normal       Cardiovascular negative cardio ROS  Rhythm:Regular Rate:Normal     Neuro/Psych negative neurological ROS  negative psych ROS   GI/Hepatic Neg liver ROS, GERD-  Medicated,  Endo/Other  negative endocrine ROS  Renal/GU negative Renal ROS  negative genitourinary   Musculoskeletal negative musculoskeletal ROS (+)   Abdominal   Peds negative pediatric ROS (+)  Hematology negative hematology ROS (+)   Anesthesia Other Findings   Reproductive/Obstetrics negative OB ROS                           Anesthesia Physical Anesthesia Plan  ASA: II  Anesthesia Plan: General   Post-op Pain Management:    Induction: Intravenous  Airway Management Planned: LMA  Additional Equipment:   Intra-op Plan:   Post-operative Plan:   Informed Consent: I have reviewed the patients History and Physical, chart, labs and discussed the procedure including the risks, benefits and alternatives for the proposed anesthesia with the patient or authorized representative who has indicated his/her understanding and acceptance.   Dental advisory given  Plan Discussed with: CRNA and Surgeon  Anesthesia Plan Comments:         Anesthesia Quick Evaluation  

## 2012-02-16 NOTE — Anesthesia Procedure Notes (Signed)
Procedure Name: LMA Insertion Date/Time: 02/16/2012 8:51 AM Performed by: Fran Lowes Pre-anesthesia Checklist: Patient identified, Emergency Drugs available, Suction available and Patient being monitored Patient Re-evaluated:Patient Re-evaluated prior to inductionOxygen Delivery Method: Circle System Utilized Preoxygenation: Pre-oxygenation with 100% oxygen Intubation Type: IV induction Ventilation: Mask ventilation without difficulty LMA: LMA inserted LMA Size: 4.0 Number of attempts: 1 Airway Equipment and Method: bite block Placement Confirmation: positive ETCO2 Tube secured with: Tape Dental Injury: Teeth and Oropharynx as per pre-operative assessment

## 2012-02-16 NOTE — Transfer of Care (Signed)
Immediate Anesthesia Transfer of Care Note  Patient: Nicholas Owen  Procedure(s) Performed: Procedure(s) (LRB): CYSTOSCOPY FLEXIBLE (N/A)  Patient Location: Patient transported to PACU with oxygen via face mask at 4 Liters / Min  Anesthesia Type: General  Level of Consciousness: awake and alert   Airway & Oxygen Therapy: Patient Spontanous Breathing and Patient connected to face mask oxygen  Post-op Assessment: Report given to PACU RN and Post -op Vital signs reviewed and stable  Post vital signs: Reviewed and stable  Dentition: Teeth and oropharynx remain in pre-op condition  Complications: No apparent anesthesia complications

## 2012-02-16 NOTE — Op Note (Signed)
Preoperative diagnosis: foreign body in urethra status post robotic prostatectomy for prostate cancer. Postoperative diagnosis: Same  Procedure: Cystoscopy with removal of foreign body from urethra   Surgeon: Valetta Fuller M.D.  Anesthesia: Gen.  Indications: Patient is now proximally 6 weeks status post a robotic prostatectomy. Surgery or was otherwise uneventful. The patient has complained of a constant burning sensation in his penis especially when he is up on his feet. Urinalysis recently was relatively unremarkable. We performed flexible cystoscopy and saw that the patient had a few vascular staples early at the site of the bladder/urethral anastomosis. The staples were from the stapling of the dorsal vein complex. The patient had been experiencing penile discomfort which I thought probably were not related to the staples but that removal would be prudent. He presents now for removal of these exposed staples to reduce the risk of subsequent complications with stone formation etc.     Technique and findings: Patient was brought to the operating room where he had successful induction of general anesthesia with LMA airway. He was placed in lithotomy position and prepped and draped in usual manner. The patient received perioperative antibiotics. Appropriate surgical timeout was performed. The patient underwent a combination of rigid and flexible cystoscopy. The patient appeared to have a reasonably healthy-appearing external sphincter complex without obvious defect. Anteriorly right at the bladder neck were several exposed staples. Utilizing graspers we were able to remove for such staples. No additional expose foreign bodies were appreciated. No obvious complications or problems occurred. Patient was brought to recovery room stable condition.

## 2012-02-16 NOTE — Anesthesia Postprocedure Evaluation (Signed)
  Anesthesia Post-op Note  Patient: Nicholas Owen  Procedure(s) Performed: Procedure(s) (LRB): CYSTOSCOPY FLEXIBLE (N/A)  Patient Location: PACU  Anesthesia Type: General  Level of Consciousness: awake and alert   Airway and Oxygen Therapy: Patient Spontanous Breathing  Post-op Pain: mild  Post-op Assessment: Post-op Vital signs reviewed, Patient's Cardiovascular Status Stable, Respiratory Function Stable, Patent Airway and No signs of Nausea or vomiting  Post-op Vital Signs: stable  Complications: No apparent anesthesia complications

## 2012-02-17 ENCOUNTER — Encounter (HOSPITAL_BASED_OUTPATIENT_CLINIC_OR_DEPARTMENT_OTHER): Payer: Self-pay | Admitting: Urology

## 2012-03-26 ENCOUNTER — Ambulatory Visit (INDEPENDENT_AMBULATORY_CARE_PROVIDER_SITE_OTHER): Payer: Federal, State, Local not specified - PPO

## 2012-03-26 DIAGNOSIS — Z23 Encounter for immunization: Secondary | ICD-10-CM

## 2012-03-31 ENCOUNTER — Telehealth: Payer: Self-pay | Admitting: Internal Medicine

## 2012-03-31 NOTE — Telephone Encounter (Signed)
Left a message for patient to call me. 

## 2012-04-01 NOTE — Telephone Encounter (Signed)
May schedule direct colon as lon as he is off NSAID's x 2 months

## 2012-04-01 NOTE — Telephone Encounter (Signed)
Patient calling because he has had his prostate surgery and his surgeon told him he has healed enough to have a colonoscopy now. He is taking Sulfasalazine 500 mg BID x 1 year now. He is now have 3 bowel movements/day which is more than his usual. BM are normal, denies bleeding or pain. Per last OV note he was to have a repeat colonoscopy after prostate cancer was taken care of. Does he need OV or direct colonoscopy. Please, advise.

## 2012-04-01 NOTE — Telephone Encounter (Signed)
He may have a direct colonoscopy but he ought to be off NSAID's x 2 months.

## 2012-04-01 NOTE — Telephone Encounter (Signed)
Patient left a message to call him at cell [phone]. Left a message for patient to call me.

## 2012-04-02 NOTE — Telephone Encounter (Signed)
Spoke with patient and he is no longer on NSAID's. Will schedule procedure as soon as Jan. Schedule is open.

## 2012-04-02 NOTE — Telephone Encounter (Signed)
Left a message for patient to call me. 

## 2012-04-07 ENCOUNTER — Telehealth: Payer: Self-pay | Admitting: *Deleted

## 2012-04-07 NOTE — Telephone Encounter (Signed)
Called to offer patient to scheduled colonoscopy on 05/06/12. Patient unable to do this date.

## 2012-04-12 ENCOUNTER — Inpatient Hospital Stay (HOSPITAL_BASED_OUTPATIENT_CLINIC_OR_DEPARTMENT_OTHER)
Admission: EM | Admit: 2012-04-12 | Discharge: 2012-04-16 | DRG: 078 | Disposition: A | Discharge status: Home / Self Care | Payer: Federal, State, Local not specified - PPO | Attending: Internal Medicine | Admitting: Internal Medicine

## 2012-04-12 ENCOUNTER — Encounter (HOSPITAL_BASED_OUTPATIENT_CLINIC_OR_DEPARTMENT_OTHER): Payer: Self-pay | Admitting: Emergency Medicine

## 2012-04-12 DIAGNOSIS — K529 Noninfective gastroenteritis and colitis, unspecified: Secondary | ICD-10-CM

## 2012-04-12 DIAGNOSIS — I2782 Chronic pulmonary embolism: Secondary | ICD-10-CM | POA: Diagnosis present

## 2012-04-12 DIAGNOSIS — C61 Malignant neoplasm of prostate: Secondary | ICD-10-CM

## 2012-04-12 DIAGNOSIS — Z86718 Personal history of other venous thrombosis and embolism: Secondary | ICD-10-CM

## 2012-04-12 DIAGNOSIS — Z86711 Personal history of pulmonary embolism: Secondary | ICD-10-CM | POA: Diagnosis present

## 2012-04-12 DIAGNOSIS — E785 Hyperlipidemia, unspecified: Secondary | ICD-10-CM

## 2012-04-12 DIAGNOSIS — C4491 Basal cell carcinoma of skin, unspecified: Secondary | ICD-10-CM

## 2012-04-12 DIAGNOSIS — J309 Allergic rhinitis, unspecified: Secondary | ICD-10-CM | POA: Diagnosis present

## 2012-04-12 DIAGNOSIS — K5289 Other specified noninfective gastroenteritis and colitis: Secondary | ICD-10-CM | POA: Diagnosis present

## 2012-04-12 DIAGNOSIS — Z85828 Personal history of other malignant neoplasm of skin: Secondary | ICD-10-CM

## 2012-04-12 DIAGNOSIS — Z8546 Personal history of malignant neoplasm of prostate: Secondary | ICD-10-CM | POA: Diagnosis present

## 2012-04-12 DIAGNOSIS — E663 Overweight: Secondary | ICD-10-CM

## 2012-04-12 DIAGNOSIS — E782 Mixed hyperlipidemia: Secondary | ICD-10-CM | POA: Diagnosis present

## 2012-04-12 DIAGNOSIS — R0781 Pleurodynia: Secondary | ICD-10-CM

## 2012-04-12 DIAGNOSIS — K219 Gastro-esophageal reflux disease without esophagitis: Secondary | ICD-10-CM

## 2012-04-12 DIAGNOSIS — Z79899 Other long term (current) drug therapy: Secondary | ICD-10-CM

## 2012-04-12 DIAGNOSIS — I2699 Other pulmonary embolism without acute cor pulmonale: Principal | ICD-10-CM

## 2012-04-12 DIAGNOSIS — I8000 Phlebitis and thrombophlebitis of superficial vessels of unspecified lower extremity: Secondary | ICD-10-CM

## 2012-04-12 HISTORY — DX: Acute embolism and thrombosis of unspecified deep veins of unspecified lower extremity: I82.409

## 2012-04-12 NOTE — ED Notes (Signed)
Pt c/o pain in left shoulder and left upper chest with shob. Pt had episode of nausea earlier

## 2012-04-12 NOTE — ED Notes (Signed)
Pt reports taking 2 81 mg ASA this afternoon.

## 2012-04-13 ENCOUNTER — Emergency Department (HOSPITAL_BASED_OUTPATIENT_CLINIC_OR_DEPARTMENT_OTHER): Payer: Federal, State, Local not specified - PPO

## 2012-04-13 ENCOUNTER — Encounter (HOSPITAL_BASED_OUTPATIENT_CLINIC_OR_DEPARTMENT_OTHER): Payer: Self-pay | Admitting: Emergency Medicine

## 2012-04-13 DIAGNOSIS — I2782 Chronic pulmonary embolism: Secondary | ICD-10-CM | POA: Diagnosis present

## 2012-04-13 DIAGNOSIS — R0781 Pleurodynia: Secondary | ICD-10-CM | POA: Diagnosis present

## 2012-04-13 DIAGNOSIS — I2699 Other pulmonary embolism without acute cor pulmonale: Principal | ICD-10-CM

## 2012-04-13 DIAGNOSIS — Z86711 Personal history of pulmonary embolism: Secondary | ICD-10-CM | POA: Diagnosis present

## 2012-04-13 DIAGNOSIS — E785 Hyperlipidemia, unspecified: Secondary | ICD-10-CM

## 2012-04-13 DIAGNOSIS — K219 Gastro-esophageal reflux disease without esophagitis: Secondary | ICD-10-CM

## 2012-04-13 DIAGNOSIS — K5289 Other specified noninfective gastroenteritis and colitis: Secondary | ICD-10-CM

## 2012-04-13 LAB — POCT I-STAT 3, ART BLOOD GAS (G3+)
Patient temperature: 97.6
pCO2 arterial: 31.9 mmHg — ABNORMAL LOW (ref 35.0–45.0)
pH, Arterial: 7.477 — ABNORMAL HIGH (ref 7.350–7.450)

## 2012-04-13 LAB — COMPREHENSIVE METABOLIC PANEL
AST: 14 U/L (ref 0–37)
Albumin: 3.8 g/dL (ref 3.5–5.2)
Calcium: 8.9 mg/dL (ref 8.4–10.5)
Chloride: 101 mEq/L (ref 96–112)
Creatinine, Ser: 0.9 mg/dL (ref 0.50–1.35)
Total Bilirubin: 0.6 mg/dL (ref 0.3–1.2)
Total Protein: 6.7 g/dL (ref 6.0–8.3)

## 2012-04-13 LAB — BASIC METABOLIC PANEL
BUN: 14 mg/dL (ref 6–23)
CO2: 25 mEq/L (ref 19–32)
Chloride: 101 mEq/L (ref 96–112)
Creatinine, Ser: 0.71 mg/dL (ref 0.50–1.35)
Potassium: 4 mEq/L (ref 3.5–5.1)

## 2012-04-13 LAB — D-DIMER, QUANTITATIVE: D-Dimer, Quant: 0.88 ug/mL-FEU — ABNORMAL HIGH (ref 0.00–0.48)

## 2012-04-13 LAB — PROTIME-INR
INR: 0.94 (ref 0.00–1.49)
Prothrombin Time: 12.5 seconds (ref 11.6–15.2)

## 2012-04-13 LAB — CBC
HCT: 41.9 % (ref 39.0–52.0)
Hemoglobin: 14.5 g/dL (ref 13.0–17.0)
MCHC: 34.6 g/dL (ref 30.0–36.0)
MCV: 84.1 fL (ref 78.0–100.0)
RDW: 13.6 % (ref 11.5–15.5)
WBC: 8.1 10*3/uL (ref 4.0–10.5)

## 2012-04-13 LAB — CBC WITH DIFFERENTIAL/PLATELET
Basophils Absolute: 0 10*3/uL (ref 0.0–0.1)
Basophils Relative: 0 % (ref 0–1)
Eosinophils Absolute: 0.4 10*3/uL (ref 0.0–0.7)
HCT: 41.2 % (ref 39.0–52.0)
MCH: 29 pg (ref 26.0–34.0)
MCHC: 35 g/dL (ref 30.0–36.0)
Monocytes Absolute: 1 10*3/uL (ref 0.1–1.0)
Neutro Abs: 5.3 10*3/uL (ref 1.7–7.7)
Neutrophils Relative %: 64 % (ref 43–77)
RDW: 13.6 % (ref 11.5–15.5)

## 2012-04-13 LAB — URINALYSIS, ROUTINE W REFLEX MICROSCOPIC
Bilirubin Urine: NEGATIVE
Glucose, UA: NEGATIVE mg/dL
Hgb urine dipstick: NEGATIVE
Specific Gravity, Urine: 1.041 — ABNORMAL HIGH (ref 1.005–1.030)
Urobilinogen, UA: 0.2 mg/dL (ref 0.0–1.0)

## 2012-04-13 LAB — TROPONIN I: Troponin I: <0.3 ng/mL (ref <0.30)

## 2012-04-13 MED ORDER — MORPHINE SULFATE 4 MG/ML IJ SOLN
4.0000 mg | Freq: Once | INTRAMUSCULAR | Status: AC
  Administered 2012-04-13: 4 mg via INTRAVENOUS

## 2012-04-13 MED ORDER — MORPHINE SULFATE 2 MG/ML IJ SOLN
INTRAMUSCULAR | Status: AC
  Filled 2012-04-13: qty 1

## 2012-04-13 MED ORDER — ACETAMINOPHEN 325 MG PO TABS: 650.0000 mg | ORAL_TABLET | Freq: Four times a day (QID) | ORAL | Status: DC | PRN

## 2012-04-13 MED ORDER — OXYCODONE HCL 5 MG PO TABS
5.0000 mg | ORAL_TABLET | ORAL | Status: DC | PRN
  Administered 2012-04-13 – 2012-04-14 (×2): 5 mg via ORAL
  Filled 2012-04-13 (×2): qty 1

## 2012-04-13 MED ORDER — FENTANYL CITRATE 0.05 MG/ML IJ SOLN
75.0000 ug | Freq: Once | INTRAMUSCULAR | Status: AC
  Administered 2012-04-13: 75 ug via INTRAVENOUS
  Filled 2012-04-13: qty 2

## 2012-04-13 MED ORDER — SODIUM CHLORIDE 0.9 % IJ SOLN
3.0000 mL | Freq: Two times a day (BID) | INTRAMUSCULAR | Status: DC
  Administered 2012-04-13 – 2012-04-16 (×6): 3 mL via INTRAVENOUS

## 2012-04-13 MED ORDER — ENOXAPARIN SODIUM 100 MG/ML ~~LOC~~ SOLN
1.0000 mg/kg | Freq: Once | SUBCUTANEOUS | Status: AC
  Administered 2012-04-13: 100 mg via SUBCUTANEOUS
  Filled 2012-04-13: qty 1

## 2012-04-13 MED ORDER — COUMADIN BOOK
Freq: Once | Status: AC
  Administered 2012-04-13: 14:00:00
  Filled 2012-04-13: qty 1

## 2012-04-13 MED ORDER — WARFARIN - PHARMACIST DOSING INPATIENT
Freq: Every day | Status: DC
  Administered 2012-04-13: 18:00:00

## 2012-04-13 MED ORDER — DOCUSATE SODIUM 100 MG PO CAPS
100.0000 mg | ORAL_CAPSULE | Freq: Two times a day (BID) | ORAL | Status: DC
  Administered 2012-04-13 – 2012-04-16 (×6): 100 mg via ORAL
  Filled 2012-04-13 (×8): qty 1

## 2012-04-13 MED ORDER — MORPHINE SULFATE 4 MG/ML IJ SOLN
6.0000 mg | Freq: Once | INTRAMUSCULAR | Status: AC
  Administered 2012-04-13: 6 mg via INTRAVENOUS
  Filled 2012-04-13: qty 1

## 2012-04-13 MED ORDER — ONDANSETRON HCL 4 MG/2ML IJ SOLN: 4.0000 mg | Freq: Four times a day (QID) | INTRAMUSCULAR | Status: DC | PRN

## 2012-04-13 MED ORDER — HYDROMORPHONE HCL PF 1 MG/ML IJ SOLN
INTRAMUSCULAR | Status: AC
  Administered 2012-04-13: 1 mg via INTRAVENOUS
  Filled 2012-04-13: qty 1

## 2012-04-13 MED ORDER — MORPHINE SULFATE 4 MG/ML IJ SOLN
INTRAMUSCULAR | Status: AC
  Filled 2012-04-13: qty 1

## 2012-04-13 MED ORDER — IOHEXOL 350 MG/ML SOLN
80.0000 mL | Freq: Once | INTRAVENOUS | Status: AC | PRN
  Administered 2012-04-13: 80 mL via INTRAVENOUS

## 2012-04-13 MED ORDER — HYDROMORPHONE HCL PF 1 MG/ML IJ SOLN
1.0000 mg | Freq: Once | INTRAMUSCULAR | Status: AC
  Administered 2012-04-13: 1 mg via INTRAVENOUS
  Filled 2012-04-13: qty 1

## 2012-04-13 MED ORDER — ENOXAPARIN SODIUM 120 MG/0.8ML ~~LOC~~ SOLN
1.0000 mg/kg | Freq: Two times a day (BID) | SUBCUTANEOUS | Status: DC
  Administered 2012-04-13 – 2012-04-16 (×7): 105 mg via SUBCUTANEOUS
  Filled 2012-04-13 (×9): qty 0.8

## 2012-04-13 MED ORDER — HYDROMORPHONE HCL PF 1 MG/ML IJ SOLN
1.0000 mg | Freq: Once | INTRAMUSCULAR | Status: AC
  Administered 2012-04-13: 1 mg via INTRAVENOUS

## 2012-04-13 MED ORDER — MORPHINE SULFATE 2 MG/ML IJ SOLN
2.0000 mg | INTRAMUSCULAR | Status: DC | PRN
  Administered 2012-04-13: 2 mg via INTRAVENOUS
  Filled 2012-04-13: qty 1

## 2012-04-13 MED ORDER — ONDANSETRON HCL 4 MG PO TABS: 4.0000 mg | ORAL_TABLET | Freq: Four times a day (QID) | ORAL | Status: DC | PRN

## 2012-04-13 MED ORDER — ALUM & MAG HYDROXIDE-SIMETH 200-200-20 MG/5ML PO SUSP: 30.0000 mL | Freq: Four times a day (QID) | ORAL | Status: DC | PRN

## 2012-04-13 MED ORDER — ENOXAPARIN (LOVENOX) PATIENT EDUCATION KIT
PACK | Freq: Once | Status: AC
  Administered 2012-04-13: 18:00:00
  Filled 2012-04-13 (×2): qty 1

## 2012-04-13 MED ORDER — ASPIRIN 81 MG PO CHEW
324.0000 mg | CHEWABLE_TABLET | Freq: Once | ORAL | Status: AC
  Administered 2012-04-13: 324 mg via ORAL
  Filled 2012-04-13: qty 4

## 2012-04-13 MED ORDER — MORPHINE SULFATE 2 MG/ML IJ SOLN: 2.0000 mg | INTRAMUSCULAR | Status: DC | PRN

## 2012-04-13 MED ORDER — ACETAMINOPHEN 650 MG RE SUPP: 650.0000 mg | Freq: Four times a day (QID) | RECTAL | Status: DC | PRN

## 2012-04-13 MED ORDER — BISACODYL 5 MG PO TBEC
10.0000 mg | DELAYED_RELEASE_TABLET | Freq: Every day | ORAL | Status: DC | PRN
  Administered 2012-04-13: 10 mg via ORAL
  Filled 2012-04-13 (×2): qty 2

## 2012-04-13 MED ORDER — WARFARIN VIDEO
Freq: Once | Status: AC
  Administered 2012-04-13: 14:00:00

## 2012-04-13 MED ORDER — FLEET ENEMA 7-19 GM/118ML RE ENEM
1.0000 | ENEMA | Freq: Once | RECTAL | Status: AC | PRN
  Filled 2012-04-13: qty 1

## 2012-04-13 MED ORDER — SULFASALAZINE 500 MG PO TABS
500.0000 mg | ORAL_TABLET | Freq: Two times a day (BID) | ORAL | Status: DC
  Administered 2012-04-13 – 2012-04-16 (×7): 500 mg via ORAL
  Filled 2012-04-13 (×9): qty 1

## 2012-04-13 MED ORDER — SODIUM CHLORIDE 0.9 % IV SOLN
INTRAVENOUS | Status: DC
  Administered 2012-04-13: 18:00:00 via INTRAVENOUS
  Administered 2012-04-14: 1000 mL via INTRAVENOUS

## 2012-04-13 MED ORDER — PANTOPRAZOLE SODIUM 40 MG PO TBEC
40.0000 mg | DELAYED_RELEASE_TABLET | Freq: Every day | ORAL | Status: DC
  Administered 2012-04-13 – 2012-04-15 (×3): 40 mg via ORAL
  Filled 2012-04-13 (×3): qty 1

## 2012-04-13 MED ORDER — WARFARIN SODIUM 10 MG PO TABS
10.0000 mg | ORAL_TABLET | Freq: Once | ORAL | Status: AC
  Administered 2012-04-13: 10 mg via ORAL
  Filled 2012-04-13: qty 1

## 2012-04-13 MED ORDER — POLYETHYLENE GLYCOL 3350 17 G PO PACK
17.0000 g | PACK | Freq: Every day | ORAL | Status: DC | PRN
  Filled 2012-04-13: qty 1

## 2012-04-13 MED ORDER — MORPHINE SULFATE 2 MG/ML IJ SOLN
2.0000 mg | INTRAMUSCULAR | Status: DC | PRN
  Administered 2012-04-13: 4 mg via INTRAVENOUS
  Filled 2012-04-13: qty 2

## 2012-04-13 NOTE — Progress Notes (Addendum)
ANTICOAGULATION CONSULT NOTE - Initial Consult  Pharmacy Consult for enoxaparin and warfarin Indication: pulmonary embolus  Allergies  Allergen Reactions  . Nsaids     colitis    Patient Measurements: Height: 6' (182.9 cm) Weight: 229 lb 4.5 oz (104 kg) IBW/kg (Calculated) : 77.6   Vital Signs: Temp: 97.6 F (36.4 C) (11/12 1025) Temp src: Oral (11/12 1025) BP: 132/80 mmHg (11/12 1025) Pulse Rate: 82  (11/12 0825)  Labs:  Basename 04/13/12 0635 04/13/12 0012 04/13/12 0006  HGB -- -- 14.4  HCT -- -- 41.2  PLT -- -- 259  APTT -- -- --  LABPROT -- 12.5 --  INR -- 0.94 --  HEPARINUNFRC -- -- --  CREATININE -- -- 0.90  CKTOTAL -- -- --  CKMB -- -- --  TROPONINI <0.30 -- <0.30    Estimated Creatinine Clearance: 103.4 ml/min (by C-G formula based on Cr of 0.9).   Medical History: Past Medical History  Diagnosis Date  . Allergy   . Cancer     basal cell CA (skin)  . GERD (gastroesophageal reflux disease)   . BPH (benign prostatic hyperplasia)   . Hyperlipidemia   . Asbestos exposure   . Diverticulosis   . Tubular adenoma of colon 01/2011    done with colonoscopy  . Colitis 01/2011  . Liver cyst 10/11    4 simple cysts in liver  . 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 Basal cell carcinoma     skin of left ear  . Kidney stones   . Lipoma 12-24-11    multiple(present- lipoma 2-lt./4- rt arms/ back/ chest  . Prostate cancer 12-24-11    dx. Prostate cancer after bx.  . Deviated nasal septum     hx. of  . DVT (deep venous thrombosis)     Medications:  Prescriptions prior to admission  Medication Sig Dispense Refill  . acetaminophen (TYLENOL) 500 MG tablet Take 1,000 mg by mouth every 6 (six) hours as needed.      Marland Kitchen omeprazole (PRILOSEC) 20 MG capsule Take 20 mg by mouth every morning.      . sulfaSALAzine (AZULFIDINE) 500 MG tablet Take 1 tablet (500 mg total) by mouth 2 (two) times daily.  60 tablet  2  . tadalafil (CIALIS) 10 MG  tablet Take 10 mg by mouth daily as needed. Patients wife said patient was taking 1 tablet daily  Patient also said took 2 tablets on 04-10-2012 because he missed the day before      . [DISCONTINUED] oxymetazoline (AFRIN) 0.05 % nasal spray Place 1 spray into the nose as needed.        Assessment: 66 year old man diagnosed with acute PE to start on Lovenox and warfarin.  INR 0.94, cbc within normal limits. Goal of Therapy:  INR 2-3 Anti-Xa level 0.6-1.2 units/ml 4hrs after LMWH dose given Monitor platelets by anticoagulation protocol: Yes   Plan:  Lovenox 1mg /kg q12 - 105mg  sq q12h Warfarin 10mg  x 1 dose today. Daily Protimes.  Coumadin book and video.     Mickeal Skinner 04/13/2012,11:34 AM

## 2012-04-13 NOTE — Progress Notes (Signed)
Right:  SVT noted in the greater saphenous vein in the mid thigh.  No evidence of deep thrombosis.  No Baker's cyst.  Left:  No evidence of DVT, superficial thrombosis, or Baker's cyst.

## 2012-04-13 NOTE — ED Provider Notes (Addendum)
History  This chart was scribed for Tobin Chad, MD by Ardeen Jourdain, ED Scribe. This patient was seen in room MH10/MH10 and the patient's care was started at 2359.  CSN: [id number]  Arrival date & time 04/12/12  2346   First MD Initiated Contact with Patient 04/12/12 2359      Chief Complaint  Patient presents with  . Shortness of Breath  . Chest Pain  . Shoulder Pain    Patient is a 66 y.o. male presenting with chest pain and shoulder pain. The history is provided by the patient. No language interpreter was used.  Chest Pain The chest pain began 6 - 12 hours ago. Chest pain occurs constantly. The chest pain is worsening. The pain is associated with breathing. At its most intense, the pain is at 8/10. The pain is currently at 8/10. The severity of the pain is severe. The quality of the pain is described as heavy and sharp. The pain radiates to the left neck and left shoulder. Primary symptoms include shortness of breath and nausea. Pertinent negatives for primary symptoms include no fever, no fatigue, no syncope, no cough, no wheezing, no palpitations, no abdominal pain, no vomiting, no dizziness and no altered mental status.  Associated symptoms include diaphoresis.  Pertinent negatives for associated symptoms include no claudication, no lower extremity edema, no near-syncope, no numbness, no orthopnea, no paroxysmal nocturnal dyspnea and no weakness. He tried nothing for the symptoms. Risk factors include being elderly and male gender.  His past medical history is significant for cancer.  Pertinent negatives for past medical history include no DVT (Has had an extensive superficial vein thrombus of the right lower leg.).    Shoulder Pain Associated symptoms include chest pain and shortness of breath. Pertinent negatives include no abdominal pain.    Nicholas Owen is a 66 y.o. male who presents to the Emergency Department complaining of Cp that started this afternoon with  associated nausea and SOB. He states that the pain radiated from his shoulder to his left upper chest to his ribs. He states that the pain has been gradually worsening to the point where it hurts to breath. He denies any possible injury or h/o heart issues or CAD. He reports being diagnosed with a superficial blood clod from his groin to his ankle a year ago. He has a h/o GERD, BPH and hyperlipidemia.  He denies smoking and alcohol use.   Past Medical History  Diagnosis Date  . Allergy   . Cancer     basal cell CA (skin)  . GERD (gastroesophageal reflux disease)   . BPH (benign prostatic hyperplasia)   . Hyperlipidemia   . Asbestos exposure   . Diverticulosis   . Tubular adenoma of colon 01/2011    done with colonoscopy  . Colitis 01/2011  . Liver cyst 10/11    4 simple cysts in liver  . 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 Basal cell carcinoma     skin of left ear  . Kidney stones   . Lipoma 12-24-11    multiple(present- lipoma 2-lt./4- rt arms/ back/ chest  . Prostate cancer 12-24-11    dx. Prostate cancer after bx.  . Deviated nasal septum     hx. of    Past Surgical History  Procedure Date  . Colonoscopy   . Polypectomy   . Knee surgery 2007    right knee scope  . Tonsillectomy   .  Lipoma removals 1990's    multiple and multiple remains now  . Robot assisted laparoscopic radical prostatectomy 12/31/2011    Procedure: ROBOTIC ASSISTED LAPAROSCOPIC RADICAL PROSTATECTOMY;  Surgeon: Valetta Fuller, MD;  Location: [facility];  Service: Urology;  Laterality: N/A;      . Lymph node dissection 12/31/2011    Procedure: LYMPH NODE DISSECTION;  Surgeon: Valetta Fuller, MD;  Location: [facility];  Service: Urology;  Laterality: Bilateral;  Bilateral  . Inguinal hernia repair 1990's    double  . Cystoscopy 02/16/2012    Procedure: CYSTOSCOPY FLEXIBLE;  Surgeon: Valetta Fuller, MD;  Location: [facility];  Service: Urology;  Laterality: N/A;  FLEX  CYSTO WITH REMOVAL OF STAPLES     Family History  Problem Relation Age of Onset  . Colon cancer Mother   . Colon cancer Father   . Esophageal cancer Neg Hx   . Stomach cancer Neg Hx   . Prostate cancer Paternal Grandfather     History  Substance Use Topics  . Smoking status: Never Smoker   . Smokeless tobacco: Never Used  . Alcohol Use: Yes     Comment: rare -1 every couple months      Review of Systems  Constitutional: Positive for diaphoresis. Negative for fever, chills and fatigue.  Respiratory: Positive for chest tightness and shortness of breath. Negative for cough and wheezing.   Cardiovascular: Positive for chest pain. Negative for palpitations, orthopnea, claudication, syncope and near-syncope.  Gastrointestinal: Positive for nausea. Negative for vomiting and abdominal pain.  Neurological: Negative for dizziness, weakness and numbness.  Psychiatric/Behavioral: Negative for altered mental status.  All other systems reviewed and are negative.    Allergies  Nsaids  Home Medications   Current Outpatient Rx  Name  Route  Sig  Dispense  Refill  . OMEPRAZOLE 20 MG PO CPDR   Oral   Take 20 mg by mouth every morning.         . SULFASALAZINE 500 MG PO TABS   Oral   Take 1 tablet (500 mg total) by mouth 2 (two) times daily.   60 tablet   2   . TADALAFIL 10 MG PO TABS   Oral   Take 10 mg by mouth daily as needed.         Marland Kitchen OXYMETAZOLINE HCL 0.05 % NA SOLN   Nasal   Place 1 spray into the nose as needed.           Triage Vitals: BP 149/90  Pulse 77  Temp 97.6 F (36.4 C) (Oral)  Resp 24  Ht 6' (1.829 m)  Wt 225 lb (102.059 kg)  BMI 30.52 kg/m2  SpO2 92%  Physical Exam  Nursing note and vitals reviewed. Constitutional: He is oriented to person, place, and time. He appears well-developed and well-nourished. No distress. He is not intubated.  HENT:  Head: Normocephalic and atraumatic.  Right Ear: External ear normal.  Left Ear: External ear  normal.  Nose: Nose normal.  Mouth/Throat: Oropharynx is clear and moist. No oropharyngeal exudate.  Eyes: Conjunctivae normal are normal. Pupils are equal, round, and reactive to light. Right eye exhibits no discharge. Left eye exhibits no discharge. No scleral icterus.  Neck: Normal range of motion. Neck supple. No JVD present. No tracheal deviation present.  Cardiovascular: Normal rate, regular rhythm, normal heart sounds and intact distal pulses.  Exam reveals no gallop and no friction rub.   No murmur heard. Pulmonary/Chest: No stridor. Tachypnea  noted. No apnea and not bradypneic. He is not intubated. No respiratory distress. He has decreased breath sounds. He has no wheezes. He has no rhonchi. He has no rales. He exhibits no tenderness.       Note splinting  Abdominal: Normal appearance and bowel sounds are normal. He exhibits no distension, no ascites, no pulsatile midline mass and no mass. There is no tenderness. There is no rigidity, no rebound, no guarding, no CVA tenderness, no tenderness at McBurney's point and negative Murphy's sign. No hernia.  Musculoskeletal: Normal range of motion. He exhibits no edema and no tenderness.  Lymphadenopathy:    He has no cervical adenopathy.  Neurological: He is alert and oriented to person, place, and time.  Skin: Skin is warm and intact. No rash noted. He is diaphoretic. No cyanosis or erythema. No pallor. Nails show no clubbing.  Psychiatric: His speech is normal and behavior is normal. His mood appears anxious. His affect is not angry, not blunt, not labile and not inappropriate. Cognition and memory are normal. He does not exhibit a depressed mood.    ED Course  Procedures (including critical care time)  DIAGNOSTIC STUDIES: Oxygen Saturation is 92% on [place], adequate by my interpretation.    COORDINATION OF CARE:  12:13 AM: Discussed treatment plan which includes blood work and an EKG with pt at bedside and pt agreed to plan.     Labs  Reviewed  CBC WITH DIFFERENTIAL  COMPREHENSIVE METABOLIC PANEL  TROPONIN I   No results found.   No diagnosis found.   Date: 04/13/2012  Rate: 73 bpm  Rhythm: sinus  QRS Axis: normal  Intervals: normal  ST/T Wave abnormalities: normal  Conduction Disutrbances:none  Narrative Interpretation: note some motion artifact  Old EKG Reviewed: none available      MDM  Patient presents for evaluation of chest pain and shortness of breath. He describes severe  pleuritic pain and appears uncomfortable, diaphoretic, and acutely ill. Note a depressed O2 saturation on room air. His initial EKG demonstrates no evidence of acute ischemia. Plan a broaden evaluation including basic labs, troponin, chest x-ray, d-dimer, and coags. Will treat his pain with IV fentanyl. Will also administer by mouth aspirin.  0140.  Secondary to pleuritic pain and an elevated d-dimer, obtained PE protocol chest CT.  Pt has bilateral PEs.  Discussed the exam findings with the on call radiologist.  Ordered morphine for pain and a shot of lovenox.  Secondary to ongoing pain and a mildly depressed O2 sat, contacted the hospitalist group for admission.  Dr. Nedra Hai has accepted him for admission to a step-down bed at [facility].  Plan transfer for further evaluation and treatment.  1610.  Still awaiting a bed at [facility].  Repeat EKG demonstrates a normal sinus rhythm without evidence of strain or ischemia.  Repeat trop has been ordered.  CRITICAL CARE Performed by: Dana Allan T   Total critical care time: 35  Critical care time was exclusive of separately billable procedures and treating other patients.  Critical care was necessary to treat or prevent imminent or life-threatening deterioration.  Critical care was time spent personally by me on the following activities: development of treatment plan with patient and/or surrogate as well as nursing, discussions with consultants, evaluation of patient's response to treatment,  examination of patient, obtaining history from patient or surrogate, ordering and performing treatments and interventions, ordering and review of laboratory studies, ordering and review of radiographic studies, pulse oximetry and re-evaluation of patient's condition.  I personally performed the services described in this documentation, which was scribed in my presence. The recorded information has been reviewed and is accurate.      Tobin Chad, MD 04/13/12 1610  Tobin Chad, MD 04/13/12 [id number]

## 2012-04-13 NOTE — Progress Notes (Deleted)
No vascular study performed on this patient.

## 2012-04-13 NOTE — H&P (Signed)
[facility] History and Physical  RUBEN MAHLER ZOX:[id number] DOB: 01-Apr-1946 DOA: 04/12/2012  Referring physician: Dr Lorenso Courier PCP: Judie Petit, MD  Specialists: None  Chief Complaint: Shoulder pain/ pleuritic pain/ left rib pain  HPI: Nicholas Owen is a 66 y.o. male with past medical history of basal cell skin cancer, gastroesophageal reflux disease, prostate cancer status post robot assisted laparoscopic radical prostatectomy 12/31/2011 with lymph node dissection per Dr. Sindy Messing, colitis, prior history of superficial clot from groin to right foot one year prior to admission who [facility] department with a one-day history of left shoulder and left rib/chest pain pleuritic in nature. Patient states unable to take deep breaths secondary to left-sided rib pain. Patient stated that at 4 PM one day prior to admission he expressed some sharp left shoulder pain which moved to his left upper lung region 2 hours later that evening. Patient stated that he started having some left-sided rib cage pain around 11 PM one day prior to admission which was pleuritic in nature and inability to take deep breaths and subsequently presented to the ED. Patient denied any fever, no chills, no diaphoresis, no emesis, no abdominal pain, no diarrhea, no constipation, no dysuria, no bloody bowel movements, no hematemesis, no melena, no hematochezia, no weakness, no prior history of ulcers. Patient does endorse some nausea. Patient denies any long car rides. Patient stated that did have a prostatectomy about 3-1/2 months ago. Patient denies any family history of DVTs. Patient states A. and a half ago had a superficial clot that radiated from his right groin to his right ankle. Patient said that time was on blood thinners for about a month. In the ED CT scan of the chest which was done showed bilateral PE. The hospitalist service was called for admission for further  evaluation and management. Patient received a dose of full dose Lovenox in the ED prior to being transported to [facility].   Review of Systems: The patient denies anorexia, fever, weight loss,, vision loss, decreased hearing, hoarseness, chest pain, syncope, dyspnea on exertion, peripheral edema, balance deficits, hemoptysis, abdominal pain, melena, hematochezia, severe indigestion/heartburn, hematuria, incontinence, genital sores, muscle weakness, suspicious skin lesions, transient blindness, difficulty walking, depression, unusual weight change, abnormal bleeding, enlarged lymph nodes, angioedema, and breast masses.   Past Medical History  Diagnosis Date  . Allergy   . Cancer     basal cell CA (skin)  . GERD (gastroesophageal reflux disease)   . BPH (benign prostatic hyperplasia)   . Hyperlipidemia   . Asbestos exposure   . Diverticulosis   . Tubular adenoma of colon 01/2011    done with colonoscopy  . Colitis 01/2011  . Liver cyst 10/11    4 simple cysts in liver  . 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 Basal cell carcinoma     skin of left ear  . Kidney stones   . Lipoma 12-24-11    multiple(present- lipoma 2-lt./4- rt arms/ back/ chest  . Prostate cancer 12-24-11    dx. Prostate cancer after bx.  . Deviated nasal septum     hx. of  . DVT (deep venous thrombosis)    Past Surgical History  Procedure Date  . Colonoscopy   . Polypectomy   . Knee surgery 2007    right knee scope  . Tonsillectomy   . Lipoma removals 1990's    multiple and multiple remains now  .  Robot assisted laparoscopic radical prostatectomy 12/31/2011    Procedure: ROBOTIC ASSISTED LAPAROSCOPIC RADICAL PROSTATECTOMY;  Surgeon: Valetta Fuller, MD;  Location: [facility];  Service: Urology;  Laterality: N/A;      . Lymph node dissection 12/31/2011    Procedure: LYMPH NODE DISSECTION;  Surgeon: Valetta Fuller, MD;  Location: [facility];  Service: Urology;  Laterality: Bilateral;   Bilateral  . Inguinal hernia repair 1990's    double  . Cystoscopy 02/16/2012    Procedure: CYSTOSCOPY FLEXIBLE;  Surgeon: Valetta Fuller, MD;  Location: [facility];  Service: Urology;  Laterality: N/A;  FLEX CYSTO WITH REMOVAL OF STAPLES    Social History:  reports that he has never smoked. He has never used smokeless tobacco. He reports that he drinks alcohol. He reports that he does not use illicit drugs.  Allergies  Allergen Reactions  . Nsaids     colitis    Family History  Problem Relation Age of Onset  . Colon cancer Mother   . Colon cancer Father   . Esophageal cancer Neg Hx   . Stomach cancer Neg Hx   . Prostate cancer Paternal Grandfather      Prior to Admission medications   Medication Sig Start Date End Date Taking? Authorizing Provider  acetaminophen (TYLENOL) 500 MG tablet Take 1,000 mg by mouth every 6 (six) hours as needed.   Yes Historical Provider, MD  omeprazole (PRILOSEC) 20 MG capsule Take 20 mg by mouth every morning.   Yes Historical Provider, MD  sulfaSALAzine (AZULFIDINE) 500 MG tablet Take 1 tablet (500 mg total) by mouth 2 (two) times daily. 02/06/12  Yes Hart Carwin, MD  tadalafil (CIALIS) 10 MG tablet Take 10 mg by mouth daily as needed. Patients wife said patient was taking 1 tablet daily  Patient also said took 2 tablets on 04-10-2012 because he missed the day before   Yes Historical Provider, MD   Physical Exam: Filed Vitals:   04/13/12 0643 04/13/12 0730 04/13/12 0825 04/13/12 1025  BP: 121/75 126/59 126/79 132/80  Pulse:  89 82   Temp:  98 F (36.7 C) 98 F (36.7 C) 97.6 F (36.4 C)  TempSrc:  Oral Oral Oral  Resp: 20 18 18    Height:    6' (1.829 m)  Weight:    104 kg (229 lb 4.5 oz)  SpO2: 93% 93% 94%      General:  Well-developed well-nourished in no acute cardiopulmonary distress.  Eyes: Pupils equal round and reactive to light and accommodation. Extraocular movements intact.  ENT: Oropharynx is clear, no lesions,  no exudates.  Neck: Supple with no lymphadenopathy.  Cardiovascular: Regular rate rhythm no murmurs rubs or gallops. No JVD. No lower extremity edema.  Respiratory: Clear to auscultation bilaterally. No wheezing, no crackles, no rhonchi.  Abdomen: Soft, nontender, nondistended, positive bowel sounds  Skin: No rashes or lesions.  Musculoskeletal: 5 out of 5 bilateral upper extremity strength. 5 out of 5 bilateral lower extremity strength.  Psychiatric: Normal mood. Normal affect. Good insight. Good judgment.  Neurologic: Alert and oriented x3. Cranial nerves II through XII are grossly intact. No focal deficits.  Labs on Admission:  Basic Metabolic Panel:  Lab 04/13/12 5621  NA 137  K 3.8  CL 101  CO2 24  GLUCOSE 156*  BUN 16  CREATININE 0.90  CALCIUM 8.9  MG --  PHOS --   Liver Function Tests:  Lab 04/13/12 0006  AST 14  ALT 21  ALKPHOS 75  BILITOT 0.6  PROT 6.7  ALBUMIN 3.8   No results found for this basename: LIPASE:5,AMYLASE:5 in the last 168 hours No results found for this basename: AMMONIA:5 in the last 168 hours CBC:  Lab 04/13/12 0006  WBC 8.4  NEUTROABS 5.3  HGB 14.4  HCT 41.2  MCV 83.1  PLT 259   Cardiac Enzymes:  Lab 04/13/12 0635 04/13/12 0006  CKTOTAL -- --  CKMB -- --  CKMBINDEX -- --  TROPONINI <0.30 <0.30    BNP (last 3 results) No results found for this basename: PROBNP:3 in the last 8760 hours CBG: No results found for this basename: GLUCAP:5 in the last 168 hours  Radiological Exams on Admission: Ct Angio Chest Pe W/cm &/or Wo Cm  04/13/2012  *RADIOLOGY REPORT*  Clinical Data: Chest pain, shortness of breath.  CT ANGIOGRAPHY CHEST  Technique:  Multidetector CT imaging of the chest using the standard protocol during bolus administration of intravenous contrast. Multiplanar reconstructed images including MIPs were obtained and reviewed to evaluate the vascular anatomy.  Contrast: 80mL OMNIPAQUE IOHEXOL 350 MG/ML SOLN  Comparison:  10/06/2007  Findings: There are filling defects in the right lower lobe and inferior lingula compatible with pulmonary emboli, right greater than left.  Bibasilar airspace opacities, left and right, likely atelectasis.  Heart is mildly enlarged mild vascular congestion.  There is a lipoma along the right lateral pleura on image 47 measuring 4.4 cm maximally.  Calcified granulomas in the right middle lobe.  Calcified subcarinal lymph nodes.  There is a moderate hiatal hernia.  Small left pleural effusion. No mediastinal, hilar or axillary adenopathy.  Imaging into the upper abdomen demonstrates cysts within the liver.  Calcified granulomas in the spleen.  Incidentally noted are calcifications within the left coronary artery.  IMPRESSION: Bilateral pulmonary emboli, right greater than left.  Bibasilar opacities, likely atelectasis.  Small left pleural effusion.  Right lateral pleural lipoma.  Old granulomatous disease.  Coronary artery calcifications.  Moderate hiatal hernia.  These results were called to Dr. Lorenso Courier at the time of interpretation.   Original Report Authenticated By: Charlett Nose, M.D.    Dg Chest Port 1 View  04/13/2012  *RADIOLOGY REPORT*  Clinical Data: Shortness of breath, chest pain.  PORTABLE CHEST - 1 VIEW  Comparison: 12/24/2011  Findings: Low lung volumes with bibasilar atelectasis.  Mild cardiomegaly and vascular congestion.  No effusions.  No acute bony abnormality.  IMPRESSION: Low lung volumes.  Cardiomegaly, vascular congestion and bibasilar atelectasis.   Original Report Authenticated By: Charlett Nose, M.D.     EKG: Independently reviewed. Normal sinus rhythm  Assessment/Plan Principal Problem:  *Pulmonary emboli Active Problems:  HYPERLIPIDEMIA  ALLERGIC RHINITIS  GERD  Prostate cancer  Colitis  Pleuritic chest pain   #1 bilateral PE   questionable etiology. Patient with no family history of DVTs or PEs. Patient with a history of prostate cancer status post  prostatectomy 12/31/2011, history of basal cell cancer which may be the likely etiology of his PE. Patient received a full dose of Lovenox in the ED prior to being transported to [facility] and a such unable to obtain hypercoagulable panel at this time. We'll check a 2-D echo to rule out right ventricular strain. Will check lower extremity Dopplers to rule out DVT. We'll place on full dose Lovenox. Start Coumadin. Follow.  #2 gastroesophageal reflux disease PPI  #3 history of colitis Continue home regimen of sulfasalazine  #4 prostate cancer status post prostatectomy Stable.  #  5 questionable history of hyperlipidemia Patient states he does not have a history of hyperlipidemia and has not been on any medications for this. And lipid panel done in 2012 that showed LDL of 125. Total cholesterol 182. Likely the last modification and followup with PCP as outpatient  #6 prophylaxis PPI for GI prophylaxis. Patient on full dose Lovenox.    Code Status: Full Family Communication: Updated patient at bedside. Disposition Plan: Admit to SDU  Time spent: 60 mins  [facility] Pager [phone]  If 7PM-7AM, please contact night-coverage [email or web address] Password [facility] 04/13/2012, 11:29 AM

## 2012-04-13 NOTE — ED Notes (Signed)
MD at bedside. 

## 2012-04-13 NOTE — Progress Notes (Signed)
Utilization Review Completed.Nicholas Owen T11/05/2012   

## 2012-04-13 NOTE — ED Notes (Signed)
Contacted [facility], still no ready bed. Pt and family updated on delay.

## 2012-04-14 DIAGNOSIS — R071 Chest pain on breathing: Secondary | ICD-10-CM

## 2012-04-14 DIAGNOSIS — I517 Cardiomegaly: Secondary | ICD-10-CM

## 2012-04-14 LAB — BASIC METABOLIC PANEL
BUN: 12 mg/dL (ref 6–23)
Calcium: 8.8 mg/dL (ref 8.4–10.5)
Creatinine, Ser: 0.72 mg/dL (ref 0.50–1.35)
Glucose, Bld: 107 mg/dL — ABNORMAL HIGH (ref 70–99)
Potassium: 4 mEq/L (ref 3.5–5.1)

## 2012-04-14 LAB — PROTIME-INR: INR: 1.08 (ref 0.00–1.49)

## 2012-04-14 LAB — URINE CULTURE
Colony Count: NO GROWTH
Culture: NO GROWTH

## 2012-04-14 LAB — CBC
HCT: 39 % (ref 39.0–52.0)
MCV: 84.1 fL (ref 78.0–100.0)
RBC: 4.64 MIL/uL (ref 4.22–5.81)
WBC: 6.5 10*3/uL (ref 4.0–10.5)

## 2012-04-14 MED ORDER — OXYCODONE HCL 5 MG PO TABS: 10.0000 mg | ORAL_TABLET | Freq: Four times a day (QID) | ORAL | Status: DC | PRN

## 2012-04-14 MED ORDER — HYDROMORPHONE HCL PF 1 MG/ML IJ SOLN
1.0000 mg | INTRAMUSCULAR | Status: DC | PRN
  Administered 2012-04-14: 1 mg via INTRAVENOUS
  Filled 2012-04-14: qty 1

## 2012-04-14 MED ORDER — ENOXAPARIN (LOVENOX) PATIENT EDUCATION KIT
PACK | Freq: Once | Status: AC
  Administered 2012-04-14: 12:00:00
  Filled 2012-04-14: qty 1

## 2012-04-14 MED ORDER — WARFARIN SODIUM 10 MG PO TABS
10.0000 mg | ORAL_TABLET | Freq: Once | ORAL | Status: AC
  Administered 2012-04-14: 10 mg via ORAL
  Filled 2012-04-14: qty 1

## 2012-04-14 NOTE — Care Management Note (Addendum)
    Page 1 of 1   04/16/2012     11:08:11 AM   CARE MANAGEMENT NOTE 04/16/2012  Patient:  DEQUANN, VANDERVELDEN   Account Number:  [id number]  Date Initiated:  04/14/2012  Documentation initiated by:  Alvira Philips Assessment:   66 yr-old male adm with dx of PE; lives with spouse, independent PTA     Action/Plan:   Anticipated DC Date:     Anticipated DC Plan:        DC Planning Services  CM consult      Choice offered to / List presented to:             Status of service:  Completed, signed off Medicare Important Message given?   (If response is "NO", the following Medicare IM given date fields will be blank) Date Medicare IM given:   Date Additional Medicare IM given:    Discharge Disposition:  HOME/SELF CARE  Per UR Regulation:  Reviewed for med. necessity/level of care/duration of stay  If discussed at Long Length of Stay Meetings, dates discussed:    Comments:  PCP:  Dr. Birdie Sons 04-16-12 [address], [place] [phone] CM did speak to pt and wife about lovenox and pt will not have an issue with price. CM did call MD ot see if need to make f/u appointment with [facility] office for INR check. CM did make f/u appointment for Monday April 19, 2012 at 2:30. Will make pt aware of appointment. No furhter needs from CM at this time.   04/14/12 1126 Henrietta Mayo RN BSN MSN CCM Pt to d/c on Lovenox.  Spouse states she administered Lovenox injections to her mother - requests "refresher" from staff - teaching kit ordered.  Per Latoya @ [facility], Adline Mango, NP will regulate coumadin - pt will need hospital f/u appt with NP when d/c'd . 1330 Copay for 10 Lovenox syringes will be $40 for brand name, $10 for copay, and pt must use [facility]. Information conveyed to pt and spouse.

## 2012-04-14 NOTE — Progress Notes (Signed)
[facility] PROGRESS NOTE  Nicholas Owen NWG:[id number] DOB: Dec 07, 1945 DOA: 04/12/2012 PCP: Judie Petit, MD  Assessment/Plan: #1 bilateral PE right greater than left Questionable etiology. Likely secondary to history of prostate cancer and status post recent prostatectomy 12/31/2011. Patient also with a history of a basal cell cancer of the skin. Lower extremity Dopplers were negative for DVT however did have a superficial vein thrombosis in the right mid thigh. Clinical improvement. Hypercoagulable panel was not obtained prior to starting anticoagulations. 2-D echo is pending. Continue full dose Lovenox overlap with Coumadin. Goal INR 2-3. As this is patient's first DVT will likely need anywhere from 9-12 months of therapy. 1 month post anticoagulations may consider drawing a hypercoagulable panel.  #2 gastroesophageal reflux disease PPI  #3 history of colitis Continue sulfasalazine  #4 prostate cancer status post prostatectomy  stable.   #5 questionable history of hyperlipidemia.  Patient states has never been on any statin for this. Lipid panel in 2012 showed an LDL of 125 and a total cholesterol 182. Patient to followup with PCP as outpatient.  #6 prophylaxis PPI for GI prophylaxis patient on full dose Lovenox.   Code Status: Full Family Communication: Updated patient, no family at bedside.  Disposition Plan: Transfer to telemetry. Home when medically stable.   Consultants:  None  Procedures:  CT angiogram 04/13/2012  Lower extremity Dopplers 04/13/2012  2-D echo pending  Chest x-ray 04/13/2012  Antibiotics:  None  HPI/Subjective: Patient states pleuritic pain improving.  Objective: Filed Vitals:   04/14/12 0034 04/14/12 0400 04/14/12 0600 04/14/12 0735  BP:  127/78 130/79 134/83  Pulse:  57 53 58  Temp: 98.9 F (37.2 C) 97.5 F (36.4 C)  98 F (36.7 C)  TempSrc: Oral Oral  Oral  Resp:  14 14 19   Height:      Weight:      SpO2:  93%  92% 94%    Intake/Output Summary (Last 24 hours) at 04/14/12 1000 Last data filed at 04/14/12 0900  Gross per 24 hour  Intake   1800 ml  Output   1000 ml  Net    800 ml   Filed Weights   04/12/12 2351 04/13/12 1025  Weight: 102.059 kg (225 lb) 104 kg (229 lb 4.5 oz)    Exam:   General:  NAD  Cardiovascular: RRR  Respiratory: Bibasilar crackles. No wheezing  Abdomen: Soft/NT/ND/+BS  Data Reviewed: Basic Metabolic Panel:  Lab 04/14/12 5784 04/13/12 1214 04/13/12 0006  NA 136 136 137  K 4.0 4.0 3.8  CL 100 101 101  CO2 26 25 24   GLUCOSE 107* 121* 156*  BUN 12 14 16   CREATININE 0.72 0.71 0.90  CALCIUM 8.8 8.9 8.9  MG -- 2.0 --  PHOS -- -- --   Liver Function Tests:  Lab 04/13/12 0006  AST 14  ALT 21  ALKPHOS 75  BILITOT 0.6  PROT 6.7  ALBUMIN 3.8   No results found for this basename: LIPASE:5,AMYLASE:5 in the last 168 hours No results found for this basename: AMMONIA:5 in the last 168 hours CBC:  Lab 04/14/12 0500 04/13/12 1214 04/13/12 0006  WBC 6.5 8.1 8.4  NEUTROABS -- -- 5.3  HGB 13.5 14.5 14.4  HCT 39.0 41.9 41.2  MCV 84.1 84.1 83.1  PLT 226 206 259   Cardiac Enzymes:  Lab 04/13/12 0635 04/13/12 0006  CKTOTAL -- --  CKMB -- --  CKMBINDEX -- --  TROPONINI <0.30 <0.30   BNP (last 3 results) No results  found for this basename: PROBNP:3 in the last 8760 hours CBG: No results found for this basename: GLUCAP:5 in the last 168 hours  Recent Results (from the past 240 hour(s))  MRSA PCR SCREENING     Status: Normal   Collection Time   04/13/12 11:37 AM      Component Value Range Status Comment   MRSA by PCR NEGATIVE  NEGATIVE Final      Studies: Ct Angio Chest Pe W/cm &/or Wo Cm  04/13/2012  *RADIOLOGY REPORT*  Clinical Data: Chest pain, shortness of breath.  CT ANGIOGRAPHY CHEST  Technique:  Multidetector CT imaging of the chest using the standard protocol during bolus administration of intravenous contrast. Multiplanar reconstructed  images including MIPs were obtained and reviewed to evaluate the vascular anatomy.  Contrast: 80mL OMNIPAQUE IOHEXOL 350 MG/ML SOLN  Comparison: 10/06/2007  Findings: There are filling defects in the right lower lobe and inferior lingula compatible with pulmonary emboli, right greater than left.  Bibasilar airspace opacities, left and right, likely atelectasis.  Heart is mildly enlarged mild vascular congestion.  There is a lipoma along the right lateral pleura on image 47 measuring 4.4 cm maximally.  Calcified granulomas in the right middle lobe.  Calcified subcarinal lymph nodes.  There is a moderate hiatal hernia.  Small left pleural effusion. No mediastinal, hilar or axillary adenopathy.  Imaging into the upper abdomen demonstrates cysts within the liver.  Calcified granulomas in the spleen.  Incidentally noted are calcifications within the left coronary artery.  IMPRESSION: Bilateral pulmonary emboli, right greater than left.  Bibasilar opacities, likely atelectasis.  Small left pleural effusion.  Right lateral pleural lipoma.  Old granulomatous disease.  Coronary artery calcifications.  Moderate hiatal hernia.  These results were called to Dr. Lorenso Courier at the time of interpretation.   Original Report Authenticated By: Charlett Nose, M.D.    Dg Chest Port 1 View  04/13/2012  *RADIOLOGY REPORT*  Clinical Data: Shortness of breath, chest pain.  PORTABLE CHEST - 1 VIEW  Comparison: 12/24/2011  Findings: Low lung volumes with bibasilar atelectasis.  Mild cardiomegaly and vascular congestion.  No effusions.  No acute bony abnormality.  IMPRESSION: Low lung volumes.  Cardiomegaly, vascular congestion and bibasilar atelectasis.   Original Report Authenticated By: Charlett Nose, M.D.     Scheduled Meds:   . [COMPLETED] coumadin book   Does not apply Once  . docusate sodium  100 mg Oral BID  . enoxaparin (LOVENOX) injection  1 mg/kg Subcutaneous Q12H  . [COMPLETED] enoxaparin   Does not apply Once  . [EXPIRED]  morphine      . pantoprazole  40 mg Oral Q0600  . sodium chloride  3 mL Intravenous Q12H  . sulfaSALAzine  500 mg Oral BID  . [COMPLETED] warfarin  10 mg Oral ONCE-1800  . [COMPLETED] warfarin   Does not apply Once  . Warfarin - Pharmacist Dosing Inpatient   Does not apply q1800   Continuous Infusions:   . [DISCONTINUED] sodium chloride 1,000 mL (04/14/12 0400)    Principal Problem:  *Pulmonary emboli Active Problems:  HYPERLIPIDEMIA  ALLERGIC RHINITIS  GERD  Prostate cancer  Colitis  Pleuritic chest pain    Time spent: > 35 mins    [facility]  [facility] Pager [phone]. If 8PM-8AM, please contact night-coverage at [email or web address], password [facility] 04/14/2012, 10:00 AM  LOS: 2 days

## 2012-04-14 NOTE — Evaluation (Signed)
Occupational Therapy Evaluation Patient Details Name: Nicholas Owen MRN: [id number] DOB: [birth year] Today's Date: 04/14/2012 Time: 3086-5784 OT Time Calculation (min): 27 min  OT Assessment / Plan / Recommendation Clinical Impression  This 66 y.o. male admitted with bil. PEs.  Pt. fatigues quickly with activity, but [place] overall doing well.  Currently, he requires supervision with BADLs and rest breaks, but should easily return to indpendent level.  He has good support at home, and all DME.  No futher OT needs identified    OT Assessment  Patient does not need any further OT services    Follow Up Recommendations  No OT follow up    Barriers to Discharge      Equipment Recommendations  None recommended by PT;None recommended by OT    Recommendations for Other Services    Frequency       Precautions / Restrictions Precautions Precautions: None Precaution Comments: Check O2 sats Restrictions Weight Bearing Restrictions: No   Pertinent Vitals/Pain 93-95% ON ROOM AIR    ADL  Eating/Feeding: Independent Where Assessed - Eating/Feeding: Edge of bed Grooming: Wash/dry hands;Wash/dry face;Teeth care;Supervision/safety Where Assessed - Grooming: Unsupported standing Upper Body Bathing: Set up Where Assessed - Upper Body Bathing: Supported sitting Lower Body Bathing: Supervision/safety Where Assessed - Lower Body Bathing: Supported sit to stand Upper Body Dressing: Set up Where Assessed - Upper Body Dressing: Unsupported sitting Lower Body Dressing: Supervision/safety Where Assessed - Lower Body Dressing: Supported sit to Pharmacist, hospital: Radiographer, therapeutic Method: Sit to Barista: Comfort height toilet;Regular height toilet Toileting - Architect and Hygiene: Supervision/safety Where Assessed - Engineer, mining and Hygiene: Standing Tub/Shower Transfer: Therapist, sports Method:  Science writer: Shower seat with back;Shower seat without back Transfers/Ambulation Related to ADLs: Pt. ambulates with supervision and dyspnea 3/4 ADL Comments: Pt. fatigues quickly with activity.  Discussed energy conservation techniques - pacing, taking rest breaks, sitting to perform activities, etc.  Recommend pt sit to shower, and he and wife verbalize understanding    OT Diagnosis:    OT Problem List:   OT Treatment Interventions:     OT Goals    Visit Information  Last OT Received On: 04/14/12 Assistance Needed: +1    Subjective Data  Subjective: "It just really hurts into my shoulder when I take a deep breath" Patient Stated Goal: To get better   Prior Functioning     Home Living Lives With: Spouse Available Help at Discharge: Family;Available 24 hours/day Type of Home: House Home Access: Stairs to enter [employer] of Steps: 3 Entrance Stairs-Rails: None Home Layout: Two level Alternate Level Stairs-Number of Steps: flight Alternate Level Stairs-Rails: Left Bathroom Shower/Tub: Engineer, manufacturing systems: Standard Bathroom Accessibility: No Home Adaptive Equipment: Shower chair with back;Walker - rolling;Wheelchair - manual Prior Function Level of Independence: Independent Able to Take Stairs?: Yes Driving: Yes Vocation: Retired Musician: No difficulties         Vision/Perception     Cognition  Overall Cognitive Status: Appears within functional limits for tasks assessed/performed Arousal/Alertness: Awake/alert Orientation Level: Appears intact for tasks assessed Behavior During Session: [facility] for tasks performed    Extremity/Trunk Assessment Right Upper Extremity Assessment RUE ROM/Strength/Tone: Within functional levels RUE Coordination: WFL - gross/fine motor Left Upper Extremity Assessment LUE ROM/Strength/Tone: Within functional levels LUE Coordination: WFL - gross/fine  motor Right Lower Extremity Assessment RLE ROM/Strength/Tone: Within functional levels RLE Sensation: WFL - Light Touch Left Lower Extremity Assessment LLE ROM/Strength/Tone: Within  functional levels LLE Sensation: WFL - Light Touch Trunk Assessment Trunk Assessment: Normal     Mobility Bed Mobility Bed Mobility: Supine to Sit;Sitting - Scoot to Edge of Bed Supine to Sit: 6: Modified independent (Device/Increase time) Sitting - Scoot to Edge of Bed: 6: Modified independent (Device/Increase time) Details for Bed Mobility Assistance: Moves slowly, but without A.   Transfers Transfers: Sit to Stand;Stand to Sit Sit to Stand: 6: Modified independent (Device/Increase time);From bed Stand to Sit: 6: Modified independent (Device/Increase time);To bed     Shoulder Instructions     Exercise     Balance Balance Balance Assessed: No   End of Session OT - End of Session Activity Tolerance: Patient limited by fatigue Patient left: in bed;with call bell/phone within reach;with family/visitor present (EOB)  GO     Saraiah Bhat M 04/14/2012, 5:26 PM

## 2012-04-14 NOTE — Progress Notes (Signed)
Echocardiogram 2D Echocardiogram has been performed.  Rosary Filosa 04/14/2012, 11:24 AM

## 2012-04-14 NOTE — Progress Notes (Signed)
ANTICOAGULATION CONSULT NOTE - Initial Consult  Pharmacy Consult for enoxaparin and warfarin Indication: pulmonary embolus  Allergies  Allergen Reactions  . Nsaids     colitis    Patient Measurements: Height: 6' (182.9 cm) Weight: 229 lb 4.5 oz (104 kg) IBW/kg (Calculated) : 77.6   Vital Signs: Temp: 98 F (36.7 C) (11/13 0735) Temp src: Oral (11/13 0735) BP: 134/83 mmHg (11/13 0735) Pulse Rate: 58  (11/13 0735)  Labs:  Basename 04/14/12 0500 04/13/12 1214 04/13/12 0635 04/13/12 0012 04/13/12 0006  HGB 13.5 14.5 -- -- --  HCT 39.0 41.9 -- -- 41.2  PLT 226 206 -- -- 259  APTT 78* -- -- -- --  LABPROT 13.9 -- -- 12.5 --  INR 1.08 -- -- 0.94 --  HEPARINUNFRC -- -- -- -- --  CREATININE 0.72 0.71 -- -- 0.90  CKTOTAL -- -- -- -- --  CKMB -- -- -- -- --  TROPONINI -- -- <0.30 -- <0.30    Estimated Creatinine Clearance: 116.4 ml/min (by C-G formula based on Cr of 0.72).  Assessment: 66 year old man diagnosed with acute PE on Lovenox and warfarin.  INR 1.08, cbc within normal limits.  Day 2/5 heparin/warfarin overlap. Goal of Therapy:  INR 2-3 Anti-Xa level 0.6-1.2 units/ml 4hrs after LMWH dose given Monitor platelets by anticoagulation protocol: Yes   Plan:  Lovenox 1mg /kg q12 - 105mg  sq q12h Warfarin 10mg  x 1 dose today. Daily Protimes.  Farrel Gobble, Garth Bigness 04/14/2012,10:17 AM

## 2012-04-14 NOTE — Evaluation (Signed)
Physical Therapy Evaluation Patient Details Name: Nicholas Owen MRN: [id number] DOB: 1946/05/18 Today's Date: 04/14/2012 Time: 4098-1191 PT Time Calculation (min): 27 min  PT Assessment / Plan / Recommendation Clinical Impression  pt rpesents with Bil PEs.  pt very motivated and moving well.  Will keep on caseload to ensure increased activity tolerance and maintaining O2 sats with activity safely.  Anticipate no PT needs at D/C.      PT Assessment  Patient needs continued PT services    Follow Up Recommendations  No PT follow up    Does the patient have the potential to tolerate intense rehabilitation      Barriers to Discharge None      Equipment Recommendations  None recommended by PT    Recommendations for Other Services     Frequency Min 3X/week    Precautions / Restrictions Precautions Precautions: None Precaution Comments: Check O2 sats Restrictions Weight Bearing Restrictions: No   Pertinent Vitals/Pain Indicates L sided chest pain with deep breathing.      Mobility  Bed Mobility Bed Mobility: Supine to Sit;Sitting - Scoot to Edge of Bed Supine to Sit: 6: Modified independent (Device/Increase time) Sitting - Scoot to Edge of Bed: 6: Modified independent (Device/Increase time) Details for Bed Mobility Assistance: Moves slowly, but without A.   Transfers Transfers: Sit to Stand;Stand to Sit Sit to Stand: 6: Modified independent (Device/Increase time);From bed Stand to Sit: 6: Modified independent (Device/Increase time);To bed Ambulation/Gait Ambulation/Gait Assistance: 5: Supervision Ambulation Distance (Feet): 200 Feet Assistive device: None Ambulation/Gait Assistance Details: pt moves slowly and with shallow breathing.  cues for pursed lip breathing, but pt indicates deep breath is painful.  O2 sats on RA remained 92-94% Gait Pattern: Step-through pattern;Decreased stride length Stairs: No Wheelchair Mobility Wheelchair Mobility: No    Shoulder  Instructions     Exercises     PT Diagnosis:  (Decondition/Debility])  PT Problem List: Decreased activity tolerance;Decreased mobility;Cardiopulmonary status limiting activity PT Treatment Interventions: Gait training;Stair training;Functional mobility training;Patient/family education   PT Goals Acute Rehab PT Goals PT Goal Formulation: With patient Time For Goal Achievement: 04/21/12 Potential to Achieve Goals: Good Pt will Ambulate: >150 feet;Independently PT Goal: Ambulate - Progress: Goal set today Pt will Go Up / Down Stairs: Flight;with modified independence;with rail(s) PT Goal: Up/Down Stairs - Progress: Goal set today  Visit Information  Last PT Received On: 04/14/12 Assistance Needed: +1    Subjective Data  Subjective: It's already feeling better.   Patient Stated Goal: Back to normal.     Prior Functioning  Home Living Lives With: Spouse Available Help at Discharge: Family;Available 24 hours/day Type of Home: House Home Access: Stairs to enter [employer] of Steps: 3 Entrance Stairs-Rails: None Home Layout: Two level Alternate Level Stairs-Number of Steps: flight Alternate Level Stairs-Rails: Left Bathroom Shower/Tub: Engineer, manufacturing systems: Standard Bathroom Accessibility: No Home Adaptive Equipment: Shower chair with back;Walker - rolling;Wheelchair - manual Prior Function Level of Independence: Independent Able to Take Stairs?: Yes Driving: Yes Vocation: Retired Musician: No difficulties    Cognition  Overall Cognitive Status: Appears within functional limits for tasks assessed/performed Arousal/Alertness: Awake/alert Orientation Level: Appears intact for tasks assessed Behavior During Session: [facility] for tasks performed    Extremity/Trunk Assessment Right Lower Extremity Assessment RLE ROM/Strength/Tone: Within functional levels RLE Sensation: WFL - Light Touch Left Lower Extremity Assessment LLE  ROM/Strength/Tone: Within functional levels LLE Sensation: WFL - Light Touch Trunk Assessment Trunk Assessment: Normal   Balance Balance Balance Assessed:  No  End of Session PT - End of Session Equipment Utilized During Treatment: Gait belt Activity Tolerance: Patient tolerated treatment well Patient left: in bed;with call bell/phone within reach (sitting EOB) Nurse Communication: Mobility status (O2 sats)  GP     [name], [place] [phone] 04/14/2012, 3:35 PM

## 2012-04-15 LAB — CBC
HCT: 40.3 % (ref 39.0–52.0)
MCHC: 34.7 g/dL (ref 30.0–36.0)
Platelets: 239 10*3/uL (ref 150–400)
RDW: 13.4 % (ref 11.5–15.5)

## 2012-04-15 LAB — BASIC METABOLIC PANEL
BUN: 13 mg/dL (ref 6–23)
Calcium: 9 mg/dL (ref 8.4–10.5)
Chloride: 102 mEq/L (ref 96–112)
Creatinine, Ser: 0.82 mg/dL (ref 0.50–1.35)
GFR calc Af Amer: >90 mL/min (ref >90)
GFR calc non Af Amer: >90 mL/min (ref >90)

## 2012-04-15 LAB — PROTIME-INR
INR: 1.52 — ABNORMAL HIGH (ref 0.00–1.49)
Prothrombin Time: 17.9 seconds — ABNORMAL HIGH (ref 11.6–15.2)

## 2012-04-15 MED ORDER — OXYCODONE HCL 5 MG PO TABS
5.0000 mg | ORAL_TABLET | Freq: Four times a day (QID) | ORAL | Status: DC | PRN
  Administered 2012-04-15: 5 mg via ORAL
  Filled 2012-04-15: qty 1

## 2012-04-15 MED ORDER — FENTANYL CITRATE 0.05 MG/ML IJ SOLN: 25.0000 ug | Freq: Once | INTRAMUSCULAR | Status: DC

## 2012-04-15 MED ORDER — WARFARIN SODIUM 10 MG PO TABS
10.0000 mg | ORAL_TABLET | Freq: Once | ORAL | Status: AC
  Administered 2012-04-15: 10 mg via ORAL
  Filled 2012-04-15: qty 1

## 2012-04-15 NOTE — Progress Notes (Signed)
PT Cancellation Note  Patient Details Name: JAMICHAEL KNOTTS MRN: [id number] DOB: [birth year]   Cancelled Treatment:    Reason Eval/Treat Not Completed: Other (comment) (pt currently up ambulating with wife.  )   [name], [place] [phone] 04/15/2012, 10:57 AM

## 2012-04-15 NOTE — Progress Notes (Signed)
ANTICOAGULATION CONSULT NOTE - Follow Up Consult  Pharmacy Consult for Lovenox and Coumadin Indication: pulmonary embolus, bilateral  Allergies  Allergen Reactions  . Nsaids     colitis    Patient Measurements: Height: 6' (182.9 cm) Weight: 227 lb 3.2 oz (103.057 kg) IBW/kg (Calculated) : 77.6   Vital Signs: Temp: 98.1 F (36.7 C) (11/14 1411) Temp src: Oral (11/14 1411) BP: 137/86 mmHg (11/14 1411) Pulse Rate: 68  (11/14 1411)  Labs:  Basename 04/15/12 0628 04/14/12 0500 04/13/12 1214 04/13/12 0635 04/13/12 0012 04/13/12 0006  HGB 14.0 13.5 -- -- -- --  HCT 40.3 39.0 41.9 -- -- --  PLT 239 226 206 -- -- --  APTT -- 78* -- -- -- --  LABPROT 17.9* 13.9 -- -- 12.5 --  INR 1.52* 1.08 -- -- 0.94 --  HEPARINUNFRC -- -- -- -- -- --  CREATININE 0.82 0.72 0.71 -- -- --  CKTOTAL -- -- -- -- -- --  CKMB -- -- -- -- -- --  TROPONINI -- -- -- <0.30 -- <0.30    Estimated Creatinine Clearance: 113 ml/min (by C-G formula based on Cr of 0.82).  Assessment:   Day # 3 or 5 minimum overlap Lovenox and Coumadin.   Wife has learned to administer Lovenox. Hoping for discharge 11/15.  INR rising after Coumadin 10 mg daily x 2 days.  Discussed Coumadin use, including indication, precautions, monitoring, and potential drug-drug and drug-food interactions.  They have a Coumadin booklet, and will watch the video today.  Goal of Therapy:  INR 2-3 Monitor platelets by anticoagulation protocol: Yes   Plan:   Coumadin 10 mg again today.  Daily PT/INR while in the hospital.  Continue Lovenox 105 mg SQ q12h.  Adjusted administration times from 1a/1p to 11a/11p.  Could use 100 mg SQ q12h at discharge (103 kg).  Expect INR to rise again in am, so will likely need to decrease Coumadin dose 11/15.  Dennie Fetters, [place] Pager: [phone] 04/15/2012,3:16 PM

## 2012-04-15 NOTE — Progress Notes (Signed)
[facility] PROGRESS NOTE  Nicholas Owen HYQ:[id number] DOB: 1945-10-13 DOA: 04/12/2012 PCP: Judie Petit, MD  Assessment/Plan: #1 bilateral PE right greater than left Questionable etiology. Clinical improvement. Likely secondary to history of prostate cancer and status post recent prostatectomy 12/31/2011. Patient also with a history of a basal cell cancer of the skin. Lower extremity Dopplers were negative for DVT however did have a superficial vein thrombosis in the right mid thigh. Clinical improvement. Hypercoagulable panel was not obtained prior to starting anticoagulations. 2-D echo with no RV strain. Continue full dose Lovenox overlap with Coumadin. Goal INR 2-3. As this is patient's first DVT will likely need anywhere from 9-12 months of therapy. 1 month post anticoagulations may consider drawing a hypercoagulable panel. Patient will follow up at PCP office for PT/INR on discharge.  #2 gastroesophageal reflux disease PPI  #3 history of colitis Continue sulfasalazine  #4 prostate cancer status post prostatectomy  stable.   #5 questionable history of hyperlipidemia.  Patient states has never been on any statin for this. Lipid panel in 2012 showed an LDL of 125 and a total cholesterol 182. Patient to followup with PCP as outpatient.  #6 prophylaxis PPI for GI prophylaxis patient on full dose Lovenox.   Code Status: Full Family Communication: Updated patient, no family at bedside.  Disposition Plan:  Home when medically stable hopefully tomorrow.   Consultants:  None  Procedures:  CT angiogram 04/13/2012  Lower extremity Dopplers 04/13/2012  2-D echo 04/14/12  Chest x-ray 04/13/2012  Antibiotics:  None  HPI/Subjective: Patient states pleuritic pain improving. Patient states able to take deeper breaths. Feeling better.  Objective: Filed Vitals:   04/14/12 1400 04/14/12 1417 04/14/12 2109 04/15/12 0548  BP: 132/89  130/75 128/81  Pulse: 74  66  62  Temp: 98 F (36.7 C)  98.6 F (37 C) 98.5 F (36.9 C)  TempSrc: Oral  Oral Oral  Resp: 18  17 17   Height:      Weight:    103.057 kg (227 lb 3.2 oz)  SpO2: 93% 95% 92% 90%    Intake/Output Summary (Last 24 hours) at 04/15/12 1150 Last data filed at 04/15/12 0800  Gross per 24 hour  Intake    480 ml  Output    725 ml  Net   -245 ml   Filed Weights   04/12/12 2351 04/13/12 1025 04/15/12 0548  Weight: 102.059 kg (225 lb) 104 kg (229 lb 4.5 oz) 103.057 kg (227 lb 3.2 oz)    Exam:   General:  NAD  Cardiovascular: RRR  Respiratory: CTAB  Abdomen: Soft/NT/ND/+BS  Data Reviewed: Basic Metabolic Panel:  Lab 04/15/12 9528 04/14/12 0500 04/13/12 1214 04/13/12 0006  NA 142 136 136 137  K 4.0 4.0 4.0 3.8  CL 102 100 101 101  CO2 28 26 25 24   GLUCOSE 97 107* 121* 156*  BUN 13 12 14 16   CREATININE 0.82 0.72 0.71 0.90  CALCIUM 9.0 8.8 8.9 8.9  MG -- -- 2.0 --  PHOS -- -- -- --   Liver Function Tests:  Lab 04/13/12 0006  AST 14  ALT 21  ALKPHOS 75  BILITOT 0.6  PROT 6.7  ALBUMIN 3.8   No results found for this basename: LIPASE:5,AMYLASE:5 in the last 168 hours No results found for this basename: AMMONIA:5 in the last 168 hours CBC:  Lab 04/15/12 0628 04/14/12 0500 04/13/12 1214 04/13/12 0006  WBC 4.0 6.5 8.1 8.4  NEUTROABS -- -- -- 5.3  HGB 14.0  13.5 14.5 14.4  HCT 40.3 39.0 41.9 41.2  MCV 85.0 84.1 84.1 83.1  PLT 239 226 206 259   Cardiac Enzymes:  Lab 04/13/12 0635 04/13/12 0006  CKTOTAL -- --  CKMB -- --  CKMBINDEX -- --  TROPONINI <0.30 <0.30   BNP (last 3 results) No results found for this basename: PROBNP:3 in the last 8760 hours CBG: No results found for this basename: GLUCAP:5 in the last 168 hours  Recent Results (from the past 240 hour(s))  MRSA PCR SCREENING     Status: Normal   Collection Time   04/13/12 11:37 AM      Component Value Range Status Comment   MRSA by PCR NEGATIVE  NEGATIVE Final   URINE CULTURE     Status: Normal     Collection Time   04/13/12  2:21 PM      Component Value Range Status Comment   Specimen Description URINE, CLEAN CATCH   Final    Special Requests NONE   Final    Culture  Setup Time 04/13/2012 23:18   Final    Colony Count NO GROWTH   Final    Culture NO GROWTH   Final    Report Status 04/14/2012 FINAL   Final      Studies: No results found.  Scheduled Meds:    . docusate sodium  100 mg Oral BID  . enoxaparin (LOVENOX) injection  1 mg/kg Subcutaneous Q12H  . fentaNYL  25 mcg Intravenous Once  . pantoprazole  40 mg Oral Q0600  . sodium chloride  3 mL Intravenous Q12H  . sulfaSALAzine  500 mg Oral BID  . [COMPLETED] warfarin  10 mg Oral ONCE-1800  . Warfarin - Pharmacist Dosing Inpatient   Does not apply q1800   Continuous Infusions:   Principal Problem:  *Pulmonary emboli Active Problems:  HYPERLIPIDEMIA  ALLERGIC RHINITIS  GERD  Prostate cancer  Colitis  Pleuritic chest pain    Time spent: > 35 mins    [facility]  [facility] Pager [phone]. If 8PM-8AM, please contact night-coverage at [email or web address], password [facility] 04/15/2012, 11:50 AM  LOS: 3 days

## 2012-04-16 LAB — CBC
HCT: 41 % (ref 39.0–52.0)
MCHC: 34.9 g/dL (ref 30.0–36.0)
MCV: 83.8 fL (ref 78.0–100.0)
Platelets: 240 10*3/uL (ref 150–400)
RDW: 13.3 % (ref 11.5–15.5)

## 2012-04-16 LAB — PROTIME-INR: INR: 1.92 — ABNORMAL HIGH (ref 0.00–1.49)

## 2012-04-16 MED ORDER — ENOXAPARIN SODIUM 150 MG/ML ~~LOC~~ SOLN: 100.0000 mg | Freq: Two times a day (BID) | SUBCUTANEOUS | Status: DC

## 2012-04-16 MED ORDER — WARFARIN SODIUM 5 MG PO TABS: 7.5000 mg | ORAL_TABLET | Freq: Every day | ORAL | Status: DC

## 2012-04-16 NOTE — Progress Notes (Signed)
Physical Therapy Discharge Patient Details Name: Nicholas Owen MRN: [id number] DOB: [birth year] Today's Date: 04/16/2012 Time: 7829-5621 PT Time Calculation (min): 9 min  Patient discharged from PT services secondary to goals met and no further PT needs identified.  Please see latest therapy progress note for current level of functioning and progress toward goals.    Progress and discharge plan discussed with patient and/or caregiver: Patient/Caregiver agrees with plan  GP     [name] 04/16/2012, 12:07 PM

## 2012-04-16 NOTE — Progress Notes (Signed)
Physical Therapy Treatment Patient Details Name: Nicholas Owen MRN: [id number] DOB: 05-Nov-1945 Today's Date: 04/16/2012 Time: 4098-1191 PT Time Calculation (min): 9 min  PT Assessment / Plan / Recommendation Comments on Treatment Session  Patient with minimal dyspnea with mobility today.  Did well on stairs.  No f/u PT needed.  PT will sign off.    Follow Up Recommendations  No PT follow up     Does the patient have the potential to tolerate intense rehabilitation     Barriers to Discharge        Equipment Recommendations  None recommended by PT    Recommendations for Other Services    Frequency Min 3X/week   Plan Discharge plan remains appropriate;All goals met and education completed, patient dischaged from PT services    Precautions / Restrictions Precautions Precautions: None Restrictions Weight Bearing Restrictions: No   Pertinent Vitals/Pain     Mobility  Bed Mobility Bed Mobility: Supine to Sit Supine to Sit: 7: Independent;HOB flat Details for Bed Mobility Assistance: No cues needed Transfers Transfers: Sit to Stand;Stand to Sit Sit to Stand: 7: Independent;From bed Stand to Sit: 7: Independent;To bed Details for Transfer Assistance: Safe technique used Ambulation/Gait Ambulation/Gait Assistance: 7: Independent Ambulation Distance (Feet): 250 Feet Assistive device: None Ambulation/Gait Assistance Details: Good gait pattern, gait speed.  Dyspnea 1/4 only. Gait Pattern: Within Functional Limits Gait velocity: WFL Stairs: Yes Stairs Assistance: 5: Supervision Stairs Assistance Details (indicate cue type and reason): Instructed patient in safe gait pattern for stairs, use of rail, and to move slowly to minimize dyspnea. Stair Management Technique: One rail Left;Alternating pattern;Forwards Number of Stairs: 12       PT Goals Acute Rehab PT Goals PT Goal: Ambulate - Progress: Met PT Goal: Up/Down Stairs - Progress: Partly met (needed  supervision)  Visit Information  Last PT Received On: 04/16/12 Assistance Needed: +1    Subjective Data  Subjective: "I'm hoping to go home today"   Cognition  Overall Cognitive Status: Appears within functional limits for tasks assessed/performed Arousal/Alertness: Awake/alert Orientation Level: Appears intact for tasks assessed Behavior During Session: [name] for tasks performed    Balance  Balance Balance Assessed: Yes High Level Balance High Level Balance Activites: Turns;Head turns;Sudden stops High Level Balance Comments: No loss of balance with high level balance activities.  End of Session PT - End of Session Equipment Utilized During Treatment: Gait belt Activity Tolerance: Patient tolerated treatment well Patient left: in bed;with call bell/phone within reach;with family/visitor present (sitting on EOB for lunch) Nurse Communication: Mobility status   GP     [name] 04/16/2012, 12:06 PM Durenda Hurt. Renaldo Fiddler, [facility] Acute Rehab Services Pager [phone]

## 2012-04-16 NOTE — Discharge Summary (Signed)
Physician Discharge Summary  IBHAN MARKUNAS AVW:[id number] DOB: 01-Oct-1945 DOA: 04/12/2012  PCP: Judie Petit, MD  Admit date: 04/12/2012 Discharge date: 04/16/2012  Recommendations for Outpatient Follow-up:  1. F/U with Dr. Cato Mulligan on Monday  Discharge Diagnoses:  Principal Problem:  *Pulmonary emboli Active Problems:  GERD  Colitis  Pleuritic chest pain  HYPERLIPIDEMIA  ALLERGIC RHINITIS  Prostate cancer   Discharge Condition: stable Diet recommendation: regular  Filed Weights   04/12/12 2351 04/13/12 1025 04/15/12 0548  Weight: 102.059 kg (225 lb) 104 kg (229 lb 4.5 oz) 103.057 kg (227 lb 3.2 oz)    History of present illness:  Nicholas Owen is a 66 y.o. male with past medical history of basal cell skin cancer, gastroesophageal reflux disease, prostate cancer status post robot assisted laparoscopic radical prostatectomy 12/31/2011 with lymph node dissection per Dr. Sindy Messing, colitis, prior history of superficial clot from groin to right foot one year prior to admission who [facility] with a one-day history of left shoulder and left rib/chest pain pleuritic in nature. Patient states unable to take deep breaths secondary to left-sided rib pain. Patient stated that at 4 PM one day prior to admission he expressed some sharp left shoulder pain which moved to his left upper lung region 2 hours later that evening. Patient stated that he started having some left-sided rib cage pain around 11 PM one day prior to admission which was pleuritic in nature and inability to take deep breaths and subsequently presented to the ED. Patient denied any fever, no chills, no diaphoresis, no emesis, no abdominal pain, no diarrhea, no constipation, no dysuria, no bloody bowel movements, no hematemesis, no melena, no hematochezia, no weakness, no prior history of ulcers. Patient does endorse some nausea. Patient denies any long car rides.  Patient stated that did have a prostatectomy about 3-1/2 months ago. Patient denies any family history of DVTs. Patient states A. and a half ago had a superficial clot that radiated from his right groin to his right ankle. Patient said that time was on blood thinners for about a month. In the ED CT scan of the chest which was done showed bilateral PE. The hospitalist service was called for admission for further evaluation and management. Patient received a dose of full dose Lovenox in the ED prior to being transported to [place] Hospital Course:  #1 bilateral PE  Questionable etiology. Pt improved with therapy. Going home on 100 mg lovenox q12hours and 7.5 mg po coumadin daily. Anticoagulation education done and o/p f/u with Dr. Cato Mulligan on Monday for INR check. Likely secondary to history of prostate cancer and status post recent prostatectomy 12/31/2011. Lower extremity Dopplers were negative for DVT however did have a superficial vein thrombosis in the right mid thigh. 2-D echo with no RV strain. Goal INR 2-3. As this is patient's first DVT will likely need anywhere from 9-12 months of therapy. 1 month post anticoagulations may consider drawing a hypercoagulable panel.   #2 gastroesophageal reflux disease  PPI  #3 history of colitis  Continue sulfasalazine  #4 prostate cancer status post prostatectomy  stable.  #5 questionable history of hyperlipidemia.  Lipid panel in 2012 showed an LDL of 125 and a total cholesterol 182. Patient to followup with PCP as outpatient.  #6 prophylaxis  PPI for GI prophylaxis patient on full dose Lovenox.   Pt is doing well on discharge day. NO CP except on deep breathing. Ambulating well  with PT/OT. NO SOB or leg pain. No arrythmia.   Consultants:  None Procedures:  CT angiogram 04/13/2012  Lower extremity Dopplers 04/13/2012  2-D echo 04/14/12  Chest x-ray 04/13/2012   Discharge Exam: Filed Vitals:   04/15/12 1411 04/15/12 2100 04/16/12  0500 04/16/12 1148  BP: 137/86 131/73 128/76   Pulse: 68 58 58 78  Temp: 98.1 F (36.7 C) 98.2 F (36.8 C) 98.2 F (36.8 C)   TempSrc: Oral     Resp: 21 20 18    Height:      Weight:      SpO2: 91% 92% 91%     General: A, Ox3, NAD, eating lunch Cardiovascular: S1S2 regular, no m/r/g Respiratory:  CTAB, no w/r/c Legs : no C/C/E  Discharge Instructions  Discharge Orders    Future Appointments: Provider: Department: Dept Phone: Center:   04/19/2012 2:30 PM Baker Pierini, FNP [facility] [place] [phone] [facility]       Medication List     As of 04/16/2012 12:12 PM    STOP taking these medications         oxymetazoline 0.05 % nasal spray   Commonly known as: AFRIN      TAKE these medications         acetaminophen 500 MG tablet   Commonly known as: TYLENOL   Take 1,000 mg by mouth every 6 (six) hours as needed.      enoxaparin 150 MG/ML injection   Commonly known as: LOVENOX   Inject 0.67 mLs (100 mg total) into the skin every 12 (twelve) hours.      omeprazole 20 MG capsule   Commonly known as: PRILOSEC   Take 20 mg by mouth every morning.      sulfaSALAzine 500 MG tablet   Commonly known as: AZULFIDINE   Take 1 tablet (500 mg total) by mouth 2 (two) times daily.      tadalafil 10 MG tablet   Commonly known as: CIALIS   Take 10 mg by mouth daily as needed. Patients wife said patient was taking 1 tablet daily  Patient also said took 2 tablets on 04-10-2012 because he missed the day before      warfarin 5 MG tablet   Commonly known as: COUMADIN   Take 1.5 tablets (7.5 mg total) by mouth daily.           Follow-up Information    Follow up with Judie Petit, MD. On 04/19/2012. (@ 2:30 pm for PT/INR check)    Contact information:   [address] Christena Flake [facility] [place] [address] [phone]           The results of significant diagnostics from this hospitalization (including imaging, microbiology, ancillary and  laboratory) are listed below for reference.    Significant Diagnostic Studies: Ct Angio Chest Pe W/cm &/or Wo Cm  04/13/2012  *RADIOLOGY REPORT*  Clinical Data: Chest pain, shortness of breath.  CT ANGIOGRAPHY CHEST  Technique:  Multidetector CT imaging of the chest using the standard protocol during bolus administration of intravenous contrast. Multiplanar reconstructed images including MIPs were obtained and reviewed to evaluate the vascular anatomy.  Contrast: 80mL OMNIPAQUE IOHEXOL 350 MG/ML SOLN  Comparison: 10/06/2007  Findings: There are filling defects in the right lower lobe and inferior lingula compatible with pulmonary emboli, right greater than left.  Bibasilar airspace opacities, left and right, likely atelectasis.  Heart is mildly enlarged mild vascular congestion.  There is a lipoma along the right lateral pleura on image  47 measuring 4.4 cm maximally.  Calcified granulomas in the right middle lobe.  Calcified subcarinal lymph nodes.  There is a moderate hiatal hernia.  Small left pleural effusion. No mediastinal, hilar or axillary adenopathy.  Imaging into the upper abdomen demonstrates cysts within the liver.  Calcified granulomas in the spleen.  Incidentally noted are calcifications within the left coronary artery.  IMPRESSION: Bilateral pulmonary emboli, right greater than left.  Bibasilar opacities, likely atelectasis.  Small left pleural effusion.  Right lateral pleural lipoma.  Old granulomatous disease.  Coronary artery calcifications.  Moderate hiatal hernia.  These results were called to Dr. Lorenso Courier at the time of interpretation.   Original Report Authenticated By: Charlett Nose, M.D.    Dg Chest Port 1 View  04/13/2012  *RADIOLOGY REPORT*  Clinical Data: Shortness of breath, chest pain.  PORTABLE CHEST - 1 VIEW  Comparison: 12/24/2011  Findings: Low lung volumes with bibasilar atelectasis.  Mild cardiomegaly and vascular congestion.  No effusions.  No acute bony abnormality.   IMPRESSION: Low lung volumes.  Cardiomegaly, vascular congestion and bibasilar atelectasis.   Original Report Authenticated By: Charlett Nose, M.D.     Microbiology: Recent Results (from the past 240 hour(s))  MRSA PCR SCREENING     Status: Normal   Collection Time   04/13/12 11:37 AM      Component Value Range Status Comment   MRSA by PCR NEGATIVE  NEGATIVE Final   URINE CULTURE     Status: Normal   Collection Time   04/13/12  2:21 PM      Component Value Range Status Comment   Specimen Description URINE, CLEAN CATCH   Final    Special Requests NONE   Final    Culture  Setup Time 04/13/2012 23:18   Final    Colony Count NO GROWTH   Final    Culture NO GROWTH   Final    Report Status 04/14/2012 FINAL   Final      Labs: Basic Metabolic Panel:  Lab 04/15/12 1610 04/14/12 0500 04/13/12 1214 04/13/12 0006  NA 142 136 136 137  K 4.0 4.0 4.0 3.8  CL 102 100 101 101  CO2 28 26 25 24   GLUCOSE 97 107* 121* 156*  BUN 13 12 14 16   CREATININE 0.82 0.72 0.71 0.90  CALCIUM 9.0 8.8 8.9 8.9  MG -- -- 2.0 --  PHOS -- -- -- --   Liver Function Tests:  Lab 04/13/12 0006  AST 14  ALT 21  ALKPHOS 75  BILITOT 0.6  PROT 6.7  ALBUMIN 3.8  CBC:  Lab 04/16/12 0545 04/15/12 0628 04/14/12 0500 04/13/12 1214 04/13/12 0006  WBC 4.6 4.0 6.5 8.1 8.4  NEUTROABS -- -- -- -- 5.3  HGB 14.3 14.0 13.5 14.5 14.4  HCT 41.0 40.3 39.0 41.9 41.2  MCV 83.8 85.0 84.1 84.1 83.1  PLT 240 239 226 206 259   Cardiac Enzymes:  Lab 04/13/12 0635 04/13/12 0006  CKTOTAL -- --  CKMB -- --  CKMBINDEX -- --  TROPONINI <0.30 <0.30  Time coordinating discharge: 35 minutes  Signed:  Jerika Wales V.  [facility] 04/16/2012, 12:12 PM

## 2012-04-19 ENCOUNTER — Ambulatory Visit (INDEPENDENT_AMBULATORY_CARE_PROVIDER_SITE_OTHER): Payer: Federal, State, Local not specified - PPO | Admitting: Family

## 2012-04-19 ENCOUNTER — Telehealth: Payer: Self-pay | Admitting: Internal Medicine

## 2012-04-19 DIAGNOSIS — I2699 Other pulmonary embolism without acute cor pulmonale: Secondary | ICD-10-CM

## 2012-04-19 NOTE — Telephone Encounter (Signed)
Pt would like to request to switch from you to Dr Artist Pais. Pls advise. ( For availability reasons only!!)  Thank you.

## 2012-04-19 NOTE — Telephone Encounter (Signed)
Pt would like to switch to you. He is a patient of Dr Cato Mulligan. Pls advise. (For availability reasons only)  Thank you.

## 2012-04-19 NOTE — Patient Instructions (Addendum)
Discontinue Lovenox. Take 5mg  of coumadin on Monday and Friday. All other days, take 7.5mg . Recheck in 1 week.     Latest dosing instructions   Total Sun Mon Tue Wed Thu Fri Sat   47.5 7.5 mg 5 mg 7.5 mg 7.5 mg 7.5 mg 5 mg 7.5 mg    (5 mg1.5) (5 mg1) (5 mg1.5) (5 mg1.5) (5 mg1.5) (5 mg1) (5 mg1.5)

## 2012-04-21 NOTE — Telephone Encounter (Signed)
Ok with me 

## 2012-04-22 ENCOUNTER — Telehealth: Payer: Self-pay | Admitting: Internal Medicine

## 2012-04-22 NOTE — Telephone Encounter (Signed)
Spoke with patient's wife and told her Dr. Juanda Chance recommendations procedure in May due to recent PE.

## 2012-04-26 ENCOUNTER — Ambulatory Visit (INDEPENDENT_AMBULATORY_CARE_PROVIDER_SITE_OTHER): Payer: Federal, State, Local not specified - PPO | Admitting: Family

## 2012-04-26 DIAGNOSIS — I2699 Other pulmonary embolism without acute cor pulmonale: Secondary | ICD-10-CM

## 2012-04-26 LAB — POCT INR: INR: 3.8

## 2012-04-26 NOTE — Patient Instructions (Addendum)
Hold coumadin today. Take 5mg  of coumadin on Monday, Wednesday and Friday. All other days, take 7.5mg . Recheck in  2 weeks.     Latest dosing instructions   Total Nicholas Owen Tue Wed Thu Fri Sat   45 7.5 mg 5 mg 7.5 mg 5 mg 7.5 mg 5 mg 7.5 mg    (5 mg1.5) (5 mg1) (5 mg1.5) (5 mg1) (5 mg1.5) (5 mg1) (5 mg1.5)

## 2012-04-29 NOTE — Telephone Encounter (Signed)
ok 

## 2012-05-05 NOTE — Telephone Encounter (Signed)
LMOM

## 2012-05-12 ENCOUNTER — Ambulatory Visit (INDEPENDENT_AMBULATORY_CARE_PROVIDER_SITE_OTHER): Payer: Federal, State, Local not specified - PPO | Admitting: Family

## 2012-05-12 ENCOUNTER — Ambulatory Visit (INDEPENDENT_AMBULATORY_CARE_PROVIDER_SITE_OTHER): Payer: Federal, State, Local not specified - PPO | Admitting: Internal Medicine

## 2012-05-12 VITALS — BP 130/84 | HR 72 | Temp 98.1°F | Resp 16 | Ht 72.0 in | Wt 236.0 lb

## 2012-05-12 DIAGNOSIS — I2699 Other pulmonary embolism without acute cor pulmonale: Secondary | ICD-10-CM

## 2012-05-12 DIAGNOSIS — R7309 Other abnormal glucose: Secondary | ICD-10-CM

## 2012-05-12 DIAGNOSIS — R739 Hyperglycemia, unspecified: Secondary | ICD-10-CM | POA: Insufficient documentation

## 2012-05-12 DIAGNOSIS — C61 Malignant neoplasm of prostate: Secondary | ICD-10-CM

## 2012-05-12 NOTE — Progress Notes (Signed)
Subjective:    Patient ID: Nicholas Owen, male    DOB: 1945/08/03, 66 y.o.   MRN: [id number]  HPI  66 year old white male with history of prostate cancer, colitis and recent pulmonary emboli to transfer care. Patient previously followed by Dr. Cato Mulligan. Over the last 1 to 2 years, patient has had significant health issues.  Patient reports he was initially referred to urologist for kidney stones .  Urologist obtained PSA testing. This was elevated which lead to prostate biopsy. His Gleason score was reportedly high. Patient elected to have robotic-assisted prostatectomy. It has been at least 4 months since his surgery. Unfortunately, patient has had complications of incontinence and sexual dysfunction. He is followed by Dr.Grapey.  Within the last one to 2 months patient suffered bilateral pulmonary emboli. Patient currently on anticoagulation and is feeling much better. He denies any chest pain or shortness of breath. He is having some difficulty regulating his INR.   Hypercoagulable workup has not been completed yet. Patient's lower extremity Dopplers were negative for DVT. He has history of superficial thrombophlebitis.  He is Accompanied by his supportive wife Lafonda Mosses.  Medical records reviewed.   Review of Systems   Constitutional: Negative for activity change, appetite change and unexpected weight change.  Eyes: Negative for visual disturbance.  Respiratory: Negative for cough, chest tightness and shortness of breath.   Cardiovascular: Negative for chest pain.  Genitourinary: Negative for difficulty urinating.  Neurological: Negative for headaches.  Gastrointestinal: Negative for abdominal pain, heartburn melena or hematochezia GU - chronic incontinence.  He wears pads Endo:  Erectile dysfunction       Past Medical History  Diagnosis Date  . Allergy   . Cancer     basal cell CA (skin)  . GERD (gastroesophageal reflux disease)   . BPH (benign prostatic hyperplasia)   .  Hyperlipidemia   . Asbestos exposure   . Diverticulosis   . Tubular adenoma of colon 01/2011    done with colonoscopy  . Colitis 01/2011  . Liver cyst 10/11    4 simple cysts in liver  . 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 Basal cell carcinoma     skin of left ear  . Kidney stones   . Lipoma 12-24-11    multiple(present- lipoma 2-lt./4- rt arms/ back/ chest  . Prostate cancer 12-24-11    dx. Prostate cancer after bx.  . Deviated nasal septum     hx. of  . DVT (deep venous thrombosis)     History   Social History  . Marital Status: Married    Spouse Name: N/A    Number of Children: N/A  . Years of Education: N/A   Occupational History  . Not on file.   Social History Main Topics  . Smoking status: Never Smoker   . Smokeless tobacco: Never Used  . Alcohol Use: Yes     Comment: rare -1 every couple months  . Drug Use: No  . Sexually Active: Yes   Other Topics Concern  . Not on file   Social History Narrative   Married, retired, no children    Past Surgical History  Procedure Date  . Colonoscopy   . Polypectomy   . Knee surgery 2007    right knee scope  . Tonsillectomy   . Lipoma removals 1990's    multiple and multiple remains now  . Robot assisted laparoscopic radical prostatectomy 12/31/2011    Procedure: ROBOTIC ASSISTED  LAPAROSCOPIC RADICAL PROSTATECTOMY;  Surgeon: Valetta Fuller, MD;  Location: [facility];  Service: Urology;  Laterality: N/A;      . Lymph node dissection 12/31/2011    Procedure: LYMPH NODE DISSECTION;  Surgeon: Valetta Fuller, MD;  Location: [facility];  Service: Urology;  Laterality: Bilateral;  Bilateral  . Inguinal hernia repair 1990's    double  . Cystoscopy 02/16/2012    Procedure: CYSTOSCOPY FLEXIBLE;  Surgeon: Valetta Fuller, MD;  Location: [facility];  Service: Urology;  Laterality: N/A;  FLEX CYSTO WITH REMOVAL OF STAPLES     Family History  Problem Relation Age of Onset  . Colon cancer  Mother   . Colon cancer Father   . Esophageal cancer Neg Hx   . Stomach cancer Neg Hx   . Prostate cancer Paternal Grandfather     Allergies  Allergen Reactions  . Nsaids     colitis    Current Outpatient Prescriptions on File Prior to Visit  Medication Sig Dispense Refill  . acetaminophen (TYLENOL) 500 MG tablet Take 1,000 mg by mouth every 6 (six) hours as needed.      Marland Kitchen omeprazole (PRILOSEC) 20 MG capsule Take 20 mg by mouth every morning.      . sulfaSALAzine (AZULFIDINE) 500 MG tablet Take 1 tablet (500 mg total) by mouth 2 (two) times daily.  60 tablet  2  . tadalafil (CIALIS) 10 MG tablet Take 10 mg by mouth daily as needed. Patients wife said patient was taking 1 tablet daily  Patient also said took 2 tablets on 04-10-2012 because he missed the day before      . [DISCONTINUED] warfarin (COUMADIN) 5 MG tablet Take 1.5 tablets (7.5 mg total) by mouth daily.  45 tablet  1    BP 130/84  Pulse 72  Temp 98.1 F (36.7 C)  Resp 16  Ht 6' (1.829 m)  Wt 236 lb (107.049 kg)  BMI 32.01 kg/m2    Objective:   Physical Exam  Constitutional: He is oriented to person, place, and time. He appears well-developed and well-nourished.  HENT:  Head: Normocephalic and atraumatic.  Right Ear: External ear normal.  Left Ear: External ear normal.  Mouth/Throat: Oropharynx is clear and moist.  Eyes: EOM are normal. Pupils are equal, round, and reactive to light.  Neck: Neck supple.       No carotid bruit  Cardiovascular: Normal rate, regular rhythm and normal heart sounds.   Pulmonary/Chest: Effort normal and breath sounds normal. He has no wheezes.  Abdominal: Soft. Bowel sounds are normal. He exhibits no mass.  Musculoskeletal: He exhibits no edema.  Lymphadenopathy:    He has no cervical adenopathy.  Neurological: He is alert and oriented to person, place, and time. No cranial nerve deficit.  Skin: Skin is warm and dry.  Psychiatric: He has a normal mood and affect. His behavior is  normal.          Assessment & Plan:

## 2012-05-12 NOTE — Patient Instructions (Addendum)
Hold coumadin today. Take 7.5mg  on Tuesday and Thursday. All other days, take 5mg . Recheck in  3 weeks.     Latest dosing instructions   Total Sun Mon Tue Wed Thu Fri Sat   40 5 mg 5 mg 7.5 mg 5 mg 7.5 mg 5 mg 5 mg    (5 mg1) (5 mg1) (5 mg1.5) (5 mg1) (5 mg1.5) (5 mg1) (5 mg1)

## 2012-05-12 NOTE — Assessment & Plan Note (Signed)
Followed by Dr. Isabel Caprice. Status post robotic-assisted radical prostatectomy. Unfortunately patient has complications of urinary incontinence and sexual dysfunction.  Patient to discuss using condom catheter with his urologist.

## 2012-05-12 NOTE — Assessment & Plan Note (Signed)
Wife reports patient has had elevated blood sugar readings in the past. We discussed possibility of Type II diabetes. Patient understands to decrease intake of sweets and carbohydrates. Screen for diabetes with A1c before next office visit.

## 2012-05-12 NOTE — Assessment & Plan Note (Signed)
Patient has history of bilateral pulmonary emboli. He is currently anticoagulated with warfarin. He is considering switching to Xarelto. We discussed at least one year of anticoagulation. Hypercoagulable workup to be completed once anticoagulants have been discontinued.

## 2012-05-12 NOTE — Patient Instructions (Addendum)
Please complete the following lab tests before your next follow up appointment: BMET, A1c - 790.29 CBCD - 415.19

## 2012-05-13 ENCOUNTER — Telehealth: Payer: Self-pay | Admitting: Family

## 2012-05-13 NOTE — Telephone Encounter (Signed)
Called patient to advise that he is a candidate for Xarelto. Left a message. He can make a decision from here to continue Coumadin or take Xarelto.

## 2012-05-24 ENCOUNTER — Other Ambulatory Visit: Payer: Self-pay | Admitting: *Deleted

## 2012-05-24 ENCOUNTER — Ambulatory Visit (INDEPENDENT_AMBULATORY_CARE_PROVIDER_SITE_OTHER): Payer: Federal, State, Local not specified - PPO | Admitting: Family

## 2012-05-24 ENCOUNTER — Telehealth: Payer: Self-pay | Admitting: Internal Medicine

## 2012-05-24 DIAGNOSIS — I2699 Other pulmonary embolism without acute cor pulmonale: Secondary | ICD-10-CM

## 2012-05-24 MED ORDER — SULFASALAZINE 500 MG PO TABS: 500.0000 mg | ORAL_TABLET | Freq: Two times a day (BID) | ORAL | Status: DC

## 2012-05-24 NOTE — Telephone Encounter (Signed)
Spouse calling in states that patient needs a script for Xarelto called in to Pharmacy to take today 05/24/12 as he took his last coumadin last night. Preferred pharmacy [facility]  [phone].   This RN did see a note in encounter stating to hold coumadin today and tomorrow and start Xarelto on Wednesday.  Informed wife of these instructions however conflicts with what she was told.  OFFICE NOTE: Please follow up on script request and usage instruction clarification.

## 2012-05-24 NOTE — Patient Instructions (Addendum)
Hold Coumadin today and tomorrow. Start Xarelto 20mg  on Wednesday with food.  Recheck in 3 weeks for recheck creatinine clearance.

## 2012-05-24 NOTE — Progress Notes (Deleted)
Subjective:     Patient ID: Nicholas Owen, male   DOB: 28-May-1946, 66 y.o.   MRN: [id number]  HPI   Review of Systems     Objective:   Physical Exam     Assessment:     ***    Plan:     ***

## 2012-05-25 MED ORDER — RIVAROXABAN 20 MG PO TABS: 20.0000 mg | ORAL_TABLET | Freq: Every day | ORAL | Status: DC

## 2012-05-25 NOTE — Telephone Encounter (Signed)
Patient's wife was not present at visit yesterday. I advised patient to hold coumadin Monday and Tuesday. Start Xarelto Wednesday with food. He was given samples. I will sent to the pharmacy.

## 2012-05-25 NOTE — Telephone Encounter (Signed)
Pt's wife aware.

## 2012-06-04 ENCOUNTER — Ambulatory Visit (INDEPENDENT_AMBULATORY_CARE_PROVIDER_SITE_OTHER): Payer: Medicare Other | Admitting: Family

## 2012-06-04 DIAGNOSIS — I2699 Other pulmonary embolism without acute cor pulmonale: Secondary | ICD-10-CM | POA: Diagnosis not present

## 2012-06-04 DIAGNOSIS — T887XXA Unspecified adverse effect of drug or medicament, initial encounter: Secondary | ICD-10-CM

## 2012-06-04 LAB — BASIC METABOLIC PANEL
BUN: 17 mg/dL (ref 6–23)
Chloride: 106 mEq/L (ref 96–112)
Glucose, Bld: 129 mg/dL — ABNORMAL HIGH (ref 70–99)
Potassium: 3.9 mEq/L (ref 3.5–5.1)

## 2012-06-04 LAB — HEPATIC FUNCTION PANEL
ALT: 35 U/L (ref 0–53)
AST: 19 U/L (ref 0–37)
Albumin: 3.9 g/dL (ref 3.5–5.2)

## 2012-06-08 ENCOUNTER — Telehealth: Payer: Self-pay | Admitting: Family

## 2012-06-08 NOTE — Telephone Encounter (Signed)
Pt needs to know results of blood work done Jan 3,2014. Pt's wife thought someone was to call her back.

## 2012-06-09 NOTE — Telephone Encounter (Signed)
Left message to advise test normal and mailed

## 2012-06-14 ENCOUNTER — Encounter: Payer: Self-pay | Admitting: Internal Medicine

## 2012-06-14 ENCOUNTER — Ambulatory Visit (INDEPENDENT_AMBULATORY_CARE_PROVIDER_SITE_OTHER): Payer: Medicare Other | Admitting: Internal Medicine

## 2012-06-14 VITALS — BP 134/84 | Temp 98.0°F | Wt 241.0 lb

## 2012-06-14 DIAGNOSIS — R7309 Other abnormal glucose: Secondary | ICD-10-CM

## 2012-06-14 DIAGNOSIS — I2699 Other pulmonary embolism without acute cor pulmonale: Secondary | ICD-10-CM

## 2012-06-14 NOTE — Assessment & Plan Note (Signed)
Patient's recent fasting blood sugar 129. Discussed risk of type 2 diabetes. Patient counseled on dietary changes and need to start regular exercise program. Educational material provided.  Reassess in 2 months. A1c before next office visit.

## 2012-06-14 NOTE — Assessment & Plan Note (Signed)
Patient switched from warfarin to [facility]. He is tolerating well.  Monitor renal function every 3 months.

## 2012-06-14 NOTE — Progress Notes (Signed)
Subjective:    Patient ID: Nicholas Owen, male    DOB: May 29, 1946, 67 y.o.   MRN: [id number]  HPI  67 year old white male with history of prostate cancer and pulmonary emboli for followup. Patient had difficulty regulating INR on warfarin. Patient was switched to Xarelto. He is tolerating this well. He denies any abnormal bleeding.  He has history of abnormal glucose. His recent fasting blood sugar was 129.  He denies polyuria or polydipsia. He avoids sweets but has liberal carbohydrate intake.  He tries to exercise 2-3 times per week.  Review of Systems Negative for chest pain or shortness of breath    Past Medical History  Diagnosis Date  . Allergy   . Cancer     basal cell CA (skin)  . GERD (gastroesophageal reflux disease)   . BPH (benign prostatic hyperplasia)   . Hyperlipidemia   . Asbestos exposure   . Diverticulosis   . Tubular adenoma of colon 01/2011    done with colonoscopy  . Colitis 01/2011  . Liver cyst 10/11    4 simple cysts in liver  . 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 Basal cell carcinoma     skin of left ear  . Kidney stones   . Lipoma 12-24-11    multiple(present- lipoma 2-lt./4- rt arms/ back/ chest  . Prostate cancer 12-24-11    dx. Prostate cancer after bx.  . Deviated nasal septum     hx. of  . DVT (deep venous thrombosis)     History   Social History  . Marital Status: Married    Spouse Name: N/A    Number of Children: N/A  . Years of Education: N/A   Occupational History  . Not on file.   Social History Main Topics  . Smoking status: Never Smoker   . Smokeless tobacco: Never Used  . Alcohol Use: Yes     Comment: rare -1 every couple months  . Drug Use: No  . Sexually Active: Yes   Other Topics Concern  . Not on file   Social History Narrative   Married, retired, no children    Past Surgical History  Procedure Date  . Colonoscopy   . Polypectomy   . Knee surgery 2007    right knee scope  .  Tonsillectomy   . Lipoma removals 1990's    multiple and multiple remains now  . Robot assisted laparoscopic radical prostatectomy 12/31/2011    Procedure: ROBOTIC ASSISTED LAPAROSCOPIC RADICAL PROSTATECTOMY;  Surgeon: Valetta Fuller, MD;  Location: [facility];  Service: Urology;  Laterality: N/A;      . Lymph node dissection 12/31/2011    Procedure: LYMPH NODE DISSECTION;  Surgeon: Valetta Fuller, MD;  Location: [facility];  Service: Urology;  Laterality: Bilateral;  Bilateral  . Inguinal hernia repair 1990's    double  . Cystoscopy 02/16/2012    Procedure: CYSTOSCOPY FLEXIBLE;  Surgeon: Valetta Fuller, MD;  Location: [facility];  Service: Urology;  Laterality: N/A;  FLEX CYSTO WITH REMOVAL OF STAPLES     Family History  Problem Relation Age of Onset  . Colon cancer Mother   . Colon cancer Father   . Esophageal cancer Neg Hx   . Stomach cancer Neg Hx   . Prostate cancer Paternal Grandfather     Allergies  Allergen Reactions  . Nsaids     colitis    Current Outpatient Prescriptions on File  Prior to Visit  Medication Sig Dispense Refill  . acetaminophen (TYLENOL) 500 MG tablet Take 1,000 mg by mouth every 6 (six) hours as needed.      Marland Kitchen omeprazole (PRILOSEC) 20 MG capsule Take 20 mg by mouth every morning.      . Rivaroxaban (XARELTO) 20 MG TABS Take 1 tablet (20 mg total) by mouth daily.  30 tablet  3  . sulfaSALAzine (AZULFIDINE) 500 MG tablet Take 1 tablet (500 mg total) by mouth 2 (two) times daily.  60 tablet  1  . tadalafil (CIALIS) 10 MG tablet Take 10 mg by mouth daily as needed. Patients wife said patient was taking 1 tablet daily  Patient also said took 2 tablets on 04-10-2012 because he missed the day before        BP 134/84  Temp 98 F (36.7 C) (Oral)  Wt 241 lb (109.317 kg)    Objective:   Physical Exam  Constitutional: He is oriented to person, place, and time. He appears well-developed and well-nourished.  Neck: Neck supple.       No carotid bruit    Cardiovascular: Normal rate, regular rhythm and normal heart sounds.   Pulmonary/Chest: Effort normal and breath sounds normal. He has no wheezes.  Abdominal:       Abdominal obesity  Neurological: He is alert and oriented to person, place, and time.  Psychiatric: He has a normal mood and affect. His behavior is normal.          Assessment & Plan:

## 2012-06-14 NOTE — Patient Instructions (Addendum)
Please complete the following lab tests before your next follow up appointment: BMET, A1c, LFTs, FLP - 790.29 Limit your carbohydrates to 30 grams per meal Exercise on a regular basis

## 2012-06-17 DIAGNOSIS — C61 Malignant neoplasm of prostate: Secondary | ICD-10-CM | POA: Diagnosis not present

## 2012-07-06 ENCOUNTER — Other Ambulatory Visit: Payer: Federal, State, Local not specified - PPO

## 2012-07-13 ENCOUNTER — Ambulatory Visit: Payer: Federal, State, Local not specified - PPO | Admitting: Internal Medicine

## 2012-07-20 ENCOUNTER — Other Ambulatory Visit: Payer: Self-pay | Admitting: Internal Medicine

## 2012-08-03 ENCOUNTER — Other Ambulatory Visit: Payer: Medicare Other

## 2012-08-05 ENCOUNTER — Other Ambulatory Visit (INDEPENDENT_AMBULATORY_CARE_PROVIDER_SITE_OTHER): Payer: Medicare Other

## 2012-08-05 ENCOUNTER — Other Ambulatory Visit: Payer: Federal, State, Local not specified - PPO

## 2012-08-05 ENCOUNTER — Other Ambulatory Visit: Payer: Medicare Other

## 2012-08-05 DIAGNOSIS — R7309 Other abnormal glucose: Secondary | ICD-10-CM | POA: Diagnosis not present

## 2012-08-05 DIAGNOSIS — E785 Hyperlipidemia, unspecified: Secondary | ICD-10-CM | POA: Diagnosis not present

## 2012-08-05 LAB — BASIC METABOLIC PANEL
Calcium: 9 mg/dL (ref 8.4–10.5)
GFR: 89.98 mL/min (ref >60.00)
Sodium: 136 mEq/L (ref 135–145)

## 2012-08-05 LAB — LIPID PANEL
HDL: 37 mg/dL — ABNORMAL LOW (ref >39.00)
LDL Cholesterol: 133 mg/dL — ABNORMAL HIGH (ref 0–99)
Total CHOL/HDL Ratio: 5
Triglycerides: 95 mg/dL (ref 0.0–149.0)

## 2012-08-05 LAB — HEPATIC FUNCTION PANEL
Bilirubin, Direct: 0.1 mg/dL (ref 0.0–0.3)
Total Bilirubin: 1.2 mg/dL (ref 0.3–1.2)

## 2012-08-05 LAB — HEMOGLOBIN A1C: Hgb A1c MFr Bld: 5.8 % (ref 4.6–6.5)

## 2012-08-12 ENCOUNTER — Encounter: Payer: Self-pay | Admitting: Internal Medicine

## 2012-08-12 ENCOUNTER — Ambulatory Visit (INDEPENDENT_AMBULATORY_CARE_PROVIDER_SITE_OTHER): Payer: Medicare Other | Admitting: Internal Medicine

## 2012-08-12 VITALS — BP 128/80 | HR 64 | Temp 98.3°F | Wt 243.0 lb

## 2012-08-12 DIAGNOSIS — E785 Hyperlipidemia, unspecified: Secondary | ICD-10-CM

## 2012-08-12 DIAGNOSIS — R7309 Other abnormal glucose: Secondary | ICD-10-CM | POA: Diagnosis not present

## 2012-08-12 MED ORDER — PRAVASTATIN SODIUM 20 MG PO TABS: 20.0000 mg | ORAL_TABLET | Freq: Every day | ORAL | Status: DC

## 2012-08-12 NOTE — Assessment & Plan Note (Signed)
Improved.  Hemoglobin A1c is normal at 5.8. Patient encouraged to follow a low carb diet and exercise a regular basis.

## 2012-08-12 NOTE — Progress Notes (Signed)
Subjective:    Patient ID: Nicholas Owen, male    DOB: [birth year], 67 y.o.   MRN: [id number]  HPI  67 year old white male with history of prostate cancer and pulmonary embolism for followup. Patient previously seen for abnormal glucose. His fasting blood sugar was 129. Patient reports avoiding sweets and decreasing intake of carbs. However he has not been able to exercise and he has gained some weight.  Prostate cancer - status post radical prostatectomy-patient continues to struggle with urinary incontinence.He has a followup appointment with urologist.  Recent blood work reviewed. His hemoglobin A1c is normal at 5.8. Lipid panel reviewed. Ten-year cardiovascular risk assessed. (14.5%)  Review of Systems Negative for chest pain  Past Medical History  Diagnosis Date  . Allergy   . Cancer     basal cell CA (skin)  . GERD (gastroesophageal reflux disease)   . BPH (benign prostatic hyperplasia)   . Hyperlipidemia   . Asbestos exposure   . Diverticulosis   . Tubular adenoma of colon 01/2011    done with colonoscopy  . Colitis 01/2011  . Liver cyst 10/11    4 simple cysts in liver  . 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 Basal cell carcinoma     skin of left ear  . Kidney stones   . Lipoma 12-24-11    multiple(present- lipoma 2-lt./4- rt arms/ back/ chest  . Prostate cancer 12-24-11    dx. Prostate cancer after bx.  . Deviated nasal septum     hx. of  . DVT (deep venous thrombosis)     History   Social History  . Marital Status: Married    Spouse Name: N/A    Number of Children: N/A  . Years of Education: N/A   Occupational History  . Not on file.   Social History Main Topics  . Smoking status: Never Smoker   . Smokeless tobacco: Never Used  . Alcohol Use: Yes     Comment: rare -1 every couple months  . Drug Use: No  . Sexually Active: Yes   Other Topics Concern  . Not on file   Social History Narrative   Married, retired, no  children    Past Surgical History  Procedure Laterality Date  . Colonoscopy    . Polypectomy    . Knee surgery  2007    right knee scope  . Tonsillectomy    . Lipoma removals  1990's    multiple and multiple remains now  . Robot assisted laparoscopic radical prostatectomy  12/31/2011    Procedure: ROBOTIC ASSISTED LAPAROSCOPIC RADICAL PROSTATECTOMY;  Surgeon: Valetta Fuller, MD;  Location: [facility];  Service: Urology;  Laterality: N/A;      . Lymph node dissection  12/31/2011    Procedure: LYMPH NODE DISSECTION;  Surgeon: Valetta Fuller, MD;  Location: [facility];  Service: Urology;  Laterality: Bilateral;  Bilateral  . Inguinal hernia repair  1990's    double  . Cystoscopy  02/16/2012    Procedure: CYSTOSCOPY FLEXIBLE;  Surgeon: Valetta Fuller, MD;  Location: [facility];  Service: Urology;  Laterality: N/A;  FLEX CYSTO WITH REMOVAL OF STAPLES     Family History  Problem Relation Age of Onset  . Colon cancer Mother   . Colon cancer Father   . Esophageal cancer Neg Hx   . Stomach cancer Neg Hx   . Prostate cancer Paternal Grandfather  Allergies  Allergen Reactions  . Nsaids     colitis    Current Outpatient Prescriptions on File Prior to Visit  Medication Sig Dispense Refill  . omeprazole (PRILOSEC) 20 MG capsule Take 20 mg by mouth every morning.      . Rivaroxaban (XARELTO) 20 MG TABS Take 1 tablet (20 mg total) by mouth daily.  30 tablet  3  . sulfaSALAzine (AZULFIDINE) 500 MG tablet TAKE ONE TABLET BY MOUTH TWICE DAILY  60 tablet  0  . tadalafil (CIALIS) 10 MG tablet Take 10 mg by mouth daily as needed. Patients wife said patient was taking 1 tablet daily  Patient also said took 2 tablets on 04-10-2012 because he missed the day before       No current facility-administered medications on file prior to visit.    BP 128/80  Pulse 64  Temp(Src) 98.3 F (36.8 C) (Oral)  Wt 243 lb (110.224 kg)  BMI 32.95 kg/m2       Objective:   Physical Exam   Constitutional: He is oriented to person, place, and time. He appears well-developed and well-nourished.  Cardiovascular: Normal rate, regular rhythm and normal heart sounds.   Pulmonary/Chest: Effort normal and breath sounds normal. He has no wheezes.  Neurological: He is alert and oriented to person, place, and time.  Psychiatric: He has a normal mood and affect. His behavior is normal.          Assessment & Plan:

## 2012-08-12 NOTE — Patient Instructions (Addendum)
Please complete the following lab tests before your next follow up appointment: BMET, A1c - 790.29 FLP, LFTs - 272.4 

## 2012-08-12 NOTE — Assessment & Plan Note (Signed)
Patient's 10 year cardiovascular risk is 14.5%. I suggest we start pravastatin 20 mg once daily. Arrange followup LFTs.

## 2012-08-21 ENCOUNTER — Other Ambulatory Visit: Payer: Self-pay | Admitting: Internal Medicine

## 2012-08-23 ENCOUNTER — Other Ambulatory Visit: Payer: Self-pay | Admitting: Internal Medicine

## 2012-08-23 ENCOUNTER — Encounter: Payer: Self-pay | Admitting: Physician Assistant

## 2012-08-24 ENCOUNTER — Telehealth: Payer: Self-pay | Admitting: Internal Medicine

## 2012-08-24 MED ORDER — SULFASALAZINE 500 MG PO TABS: 500.0000 mg | ORAL_TABLET | Freq: Two times a day (BID) | ORAL | Status: DC

## 2012-08-24 NOTE — Telephone Encounter (Signed)
rx sent until patient's appointment.

## 2012-08-25 ENCOUNTER — Other Ambulatory Visit: Payer: Self-pay | Admitting: Internal Medicine

## 2012-08-31 ENCOUNTER — Ambulatory Visit (INDEPENDENT_AMBULATORY_CARE_PROVIDER_SITE_OTHER): Payer: Medicare Other | Admitting: Physician Assistant

## 2012-08-31 ENCOUNTER — Other Ambulatory Visit: Payer: Medicare Other

## 2012-08-31 ENCOUNTER — Encounter: Payer: Self-pay | Admitting: Physician Assistant

## 2012-08-31 VITALS — BP 126/72 | HR 88 | Ht 72.0 in | Wt 243.0 lb

## 2012-08-31 DIAGNOSIS — Z86711 Personal history of pulmonary embolism: Secondary | ICD-10-CM

## 2012-08-31 DIAGNOSIS — Z86718 Personal history of other venous thrombosis and embolism: Secondary | ICD-10-CM | POA: Insufficient documentation

## 2012-08-31 DIAGNOSIS — Z860100 Personal history of colon polyps, unspecified: Secondary | ICD-10-CM | POA: Insufficient documentation

## 2012-08-31 DIAGNOSIS — I2699 Other pulmonary embolism without acute cor pulmonale: Secondary | ICD-10-CM

## 2012-08-31 DIAGNOSIS — Z8719 Personal history of other diseases of the digestive system: Secondary | ICD-10-CM | POA: Diagnosis not present

## 2012-08-31 MED ORDER — SULFASALAZINE 500 MG PO TABS: 500.0000 mg | ORAL_TABLET | Freq: Two times a day (BID) | ORAL | Status: DC

## 2012-08-31 NOTE — Progress Notes (Signed)
Subjective:    Patient ID: Nicholas Owen, male    DOB: 1946/06/01, 67 y.o.   MRN: [id number]  HPI Nicholas Owen is a very nice 67 year old white male known to Dr. Lina Owen who was diagnosed with  ulcerative colitis about a year and a half ago by colonoscopy In 02/2011 he was found to have mild colitis in the descending colon and one 4 mm sessile polyp which was removed. Path returned showing a tubular adenoma and chronic active colitis from the right colon biopsies no evidence of colitis from biopsies at 80 cm. He has been maintained on Azulfidine and has done well. He comes in today to discuss followup colonoscopy stating that he is very anxious about colon cancer because both of his parents ultimately died from colon cancer and both were noted to have colitis. He denies any abdominal pain says he usually has to bowel movements per day, sometimes 3 has not been having any cramping or melena or hematochezia He is wondering if he needs to be on Azulfidine and would like to be reevaluated to see if his colitis has "healed". In the interim since his colonoscopy he would has been diagnosed with prostate cancer and underwent a laparoscopic prostatectomy in July of 2013 . He also developed bilateral pulmonary emboli in November of 2013 and was hospitalized briefly and is now on Xarelto. He is being followed by Dr. Artist Owen for primary care.    Review of Systems  Constitutional: Negative.   HENT: Negative.   Eyes: Negative.   Respiratory: Negative.   Cardiovascular: Negative.   Gastrointestinal: Negative.   Endocrine: Negative.   Genitourinary: Negative.   Allergic/Immunologic: Negative.   Neurological: Negative.   Hematological: Negative.   Psychiatric/Behavioral: Negative.    Outpatient Prescriptions Prior to Visit  Medication Sig Dispense Refill  . omeprazole (PRILOSEC) 20 MG capsule Take 20 mg by mouth every morning.      . pravastatin (PRAVACHOL) 20 MG tablet Take 1 tablet (20 mg total) by mouth  daily.  90 tablet  1  . Rivaroxaban (XARELTO) 20 MG TABS Take 1 tablet (20 mg total) by mouth daily.  30 tablet  3  . sulfaSALAzine (AZULFIDINE) 500 MG tablet Take 1 tablet (500 mg total) by mouth 2 (two) times daily.  60 tablet  0  . tadalafil (CIALIS) 10 MG tablet Take 10 mg by mouth daily as needed. Patients wife said patient was taking 1 tablet daily  Patient also said took 2 tablets on 04-10-2012 because he missed the day before       No facility-administered medications prior to visit.   Allergies  Allergen Reactions  . Nsaids     colitis   Patient Active Problem List  Diagnosis  . HYPERLIPIDEMIA  . ALLERGIC RHINITIS  . GERD  . Saphenous vein thrombophlebitis  . Prostate cancer  . Kidney stone  . Colitis  . Overweight  . Basal cell carcinoma  . Pulmonary emboli  . Pleuritic chest pain  . Other abnormal glucose  . History of DVT of lower extremity  . Personal history of colonic polyps   History   Social History Narrative   Married, retired, no children      family history includes Colon cancer in his father and mother and Prostate cancer in his paternal grandfather.  There is no history of Esophageal cancer and Stomach cancer.  Objective:   Physical Exam  well-developed older white male in no acute distress, pleasant blood pressure 126/72 pulse 88 height  6 foot weight 243. HEENT; nontraumatic normocephalic EOMI PERRLA sclera anicteric- not further examined today discussion only        Assessment & Plan:  #56 67 year old male with strong family history of colon cancer, and personal history of adenomatous colon polyps with last colonoscopy August of 2012. #2 chronic colitis on biopsies of a descending colon August 2012-currently maintained on Azulfidine an asymptomatic. This is most likely chronic ulcerative colitis however cannot rule out a drug-induced colitis #3 prostate cancer diagnosed July 2013 status Owen prostatectomy #4 bilateral PEs November 2013-rule out  underlying hyper coagulable disorder- #5 anticoagulation-on Xarelto  for bilateral PEs #6 remote history of DVT  Plan; long discussion with the patient who is very concerned about developing colon cancer given his family history and diagnosis of chronic colitis. He was wanting to proceed with a followup colonoscopy, though the usual interval followup would be closer to 5 years in his situation. Given recent bilateral pulmonary emboli-a do not think he should be taken off of anticoagulation to undergo a screening colonoscopy at this time, and have advised him that it will be safer to consider stopping his Nicholas Owen it has been at least 12 months since his pulmonary emboli. He also feel that he should be screened for underlying hypercoagulable disorder so that decision can be made whether he should be maintained on longer term anticoagulation. We will plan followup with Dr. Juanda Owen in about 65 months- continue his current dose of Azulfidine in the interim, and plan for followup colonoscopy 67 of 2014 at the soonest. We'll check hypercoagulable panel today.

## 2012-08-31 NOTE — Patient Instructions (Addendum)
Please go to the basement level to have your labs drawn.  We sent prescription refills to [facility] for Azulfadine. Take as directed.  Make an appointment to see Dr. Lina Sar for August. That schedule is not in yet.  Call in May or June and the August schedule will be in our system.

## 2012-08-31 NOTE — Progress Notes (Signed)
Reviewed and agree. With holding off on colon till Xarelto may be discontinued.

## 2012-09-01 LAB — HYPERCOAGULABLE PANEL, COMPREHENSIVE
Anticardiolipin IgM: 0 MPL U/mL (ref <11)
Beta-2 Glyco I IgG: 0 G Units (ref <20)
DRVVT 1:1 Mix: 55.3 secs — ABNORMAL HIGH (ref <42.9)
DRVVT: 80.8 secs — ABNORMAL HIGH (ref <42.9)
Lupus Anticoagulant: DETECTED — AB
PTT Lupus Anticoagulant: 74 secs — ABNORMAL HIGH (ref 28.0–43.0)
Protein C Activity: 200 % — ABNORMAL HIGH (ref 75–133)
Protein S Activity: 128 % (ref 69–129)

## 2012-09-08 ENCOUNTER — Telehealth: Payer: Self-pay | Admitting: *Deleted

## 2012-09-08 DIAGNOSIS — Z86711 Personal history of pulmonary embolism: Secondary | ICD-10-CM

## 2012-09-08 DIAGNOSIS — R760 Raised antibody titer: Secondary | ICD-10-CM

## 2012-09-08 NOTE — Telephone Encounter (Signed)
Message copied by Jacqualyn Posey on Wed Sep 08, 2012  2:07 PM ------      Message from: Meda Coffee      Created: Wed Sep 08, 2012  1:27 PM       Refer pt to Dr. Myna Hidalgo re: hx of PE and positive lupus anticoagulant.            Call pt and make sure he is aware of blood test results. ------

## 2012-09-08 NOTE — Telephone Encounter (Signed)
Pt aware of labs and referral to Dr. Myna Hidalgo

## 2012-09-15 ENCOUNTER — Other Ambulatory Visit: Payer: Self-pay | Admitting: Internal Medicine

## 2012-09-27 ENCOUNTER — Other Ambulatory Visit: Payer: Self-pay | Admitting: Family

## 2012-10-11 ENCOUNTER — Ambulatory Visit (HOSPITAL_BASED_OUTPATIENT_CLINIC_OR_DEPARTMENT_OTHER): Payer: Medicare Other | Admitting: Hematology & Oncology

## 2012-10-11 ENCOUNTER — Ambulatory Visit: Payer: Medicare Other

## 2012-10-11 ENCOUNTER — Ambulatory Visit (HOSPITAL_BASED_OUTPATIENT_CLINIC_OR_DEPARTMENT_OTHER): Payer: Medicare Other | Admitting: Lab

## 2012-10-11 VITALS — BP 118/82 | HR 69 | Temp 97.7°F | Resp 18 | Ht 71.0 in | Wt 240.0 lb

## 2012-10-11 DIAGNOSIS — Z8546 Personal history of malignant neoplasm of prostate: Secondary | ICD-10-CM | POA: Diagnosis not present

## 2012-10-11 DIAGNOSIS — R894 Abnormal immunological findings in specimens from other organs, systems and tissues: Secondary | ICD-10-CM

## 2012-10-11 DIAGNOSIS — I2699 Other pulmonary embolism without acute cor pulmonale: Secondary | ICD-10-CM

## 2012-10-11 DIAGNOSIS — I82403 Acute embolism and thrombosis of unspecified deep veins of lower extremity, bilateral: Secondary | ICD-10-CM

## 2012-10-11 DIAGNOSIS — I82409 Acute embolism and thrombosis of unspecified deep veins of unspecified lower extremity: Secondary | ICD-10-CM | POA: Diagnosis not present

## 2012-10-11 LAB — CBC WITH DIFFERENTIAL (CANCER CENTER ONLY)
BASO#: 0 10*3/uL (ref 0.0–0.2)
Eosinophils Absolute: 0.4 10*3/uL (ref 0.0–0.5)
HCT: 44.6 % (ref 38.7–49.9)
HGB: 15.6 g/dL (ref 13.0–17.1)
LYMPH%: 16.8 % (ref 14.0–48.0)
MCH: 30.5 pg (ref 28.0–33.4)
MCV: 87 fL (ref 82–98)
MONO#: 0.6 10*3/uL (ref 0.1–0.9)
MONO%: 10.7 % (ref 0.0–13.0)
NEUT%: 65.3 % (ref 40.0–80.0)
Platelets: 261 10*3/uL (ref 145–400)
RBC: 5.12 10*6/uL (ref 4.20–5.70)
WBC: 5.8 10*3/uL (ref 4.0–10.0)

## 2012-10-11 LAB — BASIC METABOLIC PANEL
BUN: 18 mg/dL (ref 6–23)
Calcium: 9.7 mg/dL (ref 8.4–10.5)
Glucose, Bld: 92 mg/dL (ref 70–99)
Sodium: 139 mEq/L (ref 135–145)

## 2012-10-11 NOTE — Progress Notes (Signed)
This office note has been dictated.

## 2012-10-12 ENCOUNTER — Telehealth: Payer: Self-pay | Admitting: *Deleted

## 2012-10-12 NOTE — Telephone Encounter (Signed)
Message copied by Daphine Deutscher on Tue Oct 12, 2012  4:47 PM ------      Message from: Hart Carwin      Created: Tue Oct 12, 2012  4:36 PM       He will need a previsit to determine that.      ----- Message -----         From: Daphine Deutscher, RN         Sent: 10/12/2012   9:55 AM           To: Hart Carwin, MD            Dr. Juanda Chance,      Is this patient ok to be done at [facility]?      Rene Kocher      ----- Message -----         From: Hart Carwin, MD         Sent: 10/11/2012   6:44 PM           To: Daphine Deutscher, RN, [name], MD            Thank You very much, [name], From our standpoint, he may stay on ASA.[name]      ----- Message -----         From: [name], MD         Sent: 10/11/2012   3:45 PM           To: Hart Carwin, MD            Dora:            i saw Mr. Hartlage today.  He will be on Xarelto for a total of 2 yrs.  I see NO problem with you doing a colonoscopy on him.  I would just stop the Xarelto 2 days PRIOR to the procedure and re-start 1 day AFTER the 'scope.  This will safely allow you to do the 'scope.            If you have any questions, please let me know.            He may be on baby ASA if he is truly LA (+).  If so, you can d/c this 4-5 days before the 'scope.            Please take care!!            [name] E                   ------

## 2012-10-12 NOTE — Progress Notes (Signed)
CC:   Barbette Hair. Artist Pais, DO Hedwig Morton. Juanda Chance, MD  DIAGNOSES: 1. Pulmonary embolism. 2. Questionable lupus anticoagulant positivity.  HISTORY OF PRESENT ILLNESS:  Mr. Liebman is a real nice 67 year old white gentleman.  He is followed by Dr. Artist Pais.  He apparently had a superficial thrombus in his left leg a few years ago.  He was not on any anticoagulation for this.  He did have a robotic prostatectomy last year.  This was in July.  He was found to have Gleason grade 7 prostate cancer.  Lymph nodes were all negative.  Margins were negative.  There was no extra prostatic extension.  There was no lymphovascular invasion.  Seminal vesicles were not involved.  He had, I think stage II (T2 N0 M0) disease.  He still has some incontinence from this.  In I think November, he then presented with shortness of breath.  He had some chest wall pain.  He had a CT angiogram done.  This, unfortunately, showed bilateral pulmonary emboli.  Right was greater than the left.  He had Doppler studies done of his legs.  Doppler showed no evidence of DVT.  He had a superficial thrombus in the greater saphenous vein in the right mid thigh.  He was subsequently placed on anticoagulation.  He was placed on Lovenox and then Coumadin.  Apparently, there were some problems regulating his INR.  He was then switched to Xarelto.  I think he switched to Xarelto in December.  He enjoys taking the Xarelto.  He has had no problems with bleeding or bruising.  He has had no leg swelling.  He has had no cough or shortness of breath.  He does have colitis.  He sees Dr. Juanda Chance.  Dr. Juanda Chance is reluctant to do a colonoscopy on him because of the Xarelto.  He is Azulfidine for the colitis.  There is a family history of colon cancer in both parents.  His father had colon cancer at age 35.  They both died from colon cancer.  As such, he is concerned about this and really wants to have that colonoscopy done.  I told him  that the colonoscopy would be safe.  The Xarelto just needs to be stopped 2 days before the procedure.  He had an extensive evaluation done for hypercoagulability.  He was found to have a positive lupus anticoagulant.  This was done just a month ago.  His factor V and prothrombin II gene were all negative. AT3, protein S and protein C were also normal.  He was kindly referred to the [facility] so that we can evaluate for the possibility of a truly positive lupus anticoagulant and to determine how long he needs to be on blood thinner.  He feels okay.  He is not having chest pain.  He is not bleeding.  There is no hemoptysis.  He has had no leg swelling.  He has had no rashes.  There is no headache.  He has had no fever, sweats, or chills.  PAST MEDICAL HISTORY: 1. Remarkable for the stage II prostate cancer, status post resection. 2. Hyperlipidemia. 3. GERD. 4. Superficial vein thrombosis of the right leg. 5. Nephrolithiasis. 6. Colitis.  ALLERGIES:  To nonsteroidals.  MEDICATIONS:  Prilosec 20 mg p.o. daily, Pravachol 20 mg p.o. daily, Azulfidine 500 mg p.o. b.i.d., and Xarelto 20 mg p.o. daily.  SOCIAL HISTORY:  Negative for tobacco use.  He has no significant alcohol use.  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  He worked for [employer].  FAMILY HISTORY:  Negative for any type of thromboembolic events in his family.  REVIEW OF SYSTEMS:  As stated in the history of present illness.  No additional findings are noted on a 12-system review.  PHYSICAL EXAMINATION:  General:  This is a well-developed, well- nourished white gentleman in no obvious distress.  Vital signs: Temperature of 97.7, pulse 69, respiratory rate 18, blood pressure is 118/82.  Weight is 240 pounds.  Head and neck:  Normocephalic, atraumatic skull.  There are no ocular or oral lesions.  There are no palpable cervical or supraclavicular lymph nodes.   Lungs:  Clear bilaterally.  Cardiac:  Regular rate and rhythm with a normal S1 and S2. There are no murmurs, rubs, or bruits.  Abdomen:  Soft with good bowel sounds.  There is no palpable abdominal mass.  There is no palpable hepatosplenomegaly.  Back:  No tenderness over the spine, ribs, or hips. Extremities:  No clubbing, cyanosis, or edema.  No palpable venous cord is noted in the legs.  He has good range of motion of his joints.  Skin: No rashes, ecchymoses, or petechiae.  LABORATORY STUDIES:  White cell count is 5.8, hemoglobin 15.6, hematocrit 44.6, platelet count 261.  IMPRESSION:  Mr. Aden is a very nice 67 year old gentleman with bilateral pulmonary emboli.  He has a past history of a superficial thrombus in the right leg.  These typically do not pose any problems with respect to propagation to pulmonary emboli.  It is hard to say whether or not he truly has a positive lupus anticoagulant.  We are rechecking this.  If it is positive on our tests, we will run a confirmatory test for this.  Whether or not he is positive for lupus anticoagulant, he will need 2 years of anticoagulation with Xarelto.  I believe this would be important for him.  If he is positive for the lupus anticoagulant, then we will add baby aspirin to the Xarelto.  I do want to repeat his CT angiogram.  I think this would be helpful for [place].  I want to do this in June, at which point it would have been 7 months or so since his initial positive test.  I also may want to repeat a Doppler of his leg.  His last Doppler, however, did look okay.  There was some superficial thrombus noted in a small segment of the greater saphenous vein.  I had a nice long talk with Mr. Krummel.  He is bothered by the incontinence from his prostate surgery.  Despite this, I told him that he is going to have to make sure he drinks quite a lot of fluids so that he does not get dehydrated.  I will see Mr. Powell back in  July.  Again, he is going to need 2 years of anticoagulation whether or not he is truly positive for the lupus anticoagulant.  I just will let Dr. Juanda Chance know that it is going to be okay to do a colonoscopy on him.  He can stop the Xarelto 2 days before the colonoscopy and have her safely do any biopsy that is necessary.  I will let her know through the Epic system.    ______________________________ [name], M.D. PRE/MEDQ  D:  10/11/2012  T:  10/12/2012  Job:  [id number]

## 2012-10-12 NOTE — Telephone Encounter (Signed)
Thank You very much, [name], From our standpoint, he may stay on ASA.[name] ----- Message ----- From: [name], MD Sent: 10/11/2012 3:45 PM To: Hart Carwin, MD Dora: i saw Nicholas Owen today. He will be on Xarelto for a total of 2 yrs. I see NO problem with you doing a colonoscopy on him. I would just stop the Xarelto 2 days PRIOR to the procedure and re-start 1 day AFTER the 'scope. This will safely allow you to do the 'scope. If you have any questions, please let me know. He may be on baby ASA if he is truly LA (+). If so, you can d/c this 4-5 days before the 'scope

## 2012-10-13 LAB — LUPUS ANTICOAGULANT PANEL
Drvvt confirmation: 2.33 Ratio — ABNORMAL HIGH (ref <1.11)
PTT Lupus Anticoagulant: 99.1 secs — ABNORMAL HIGH (ref 28.0–43.0)
PTTLA 4:1 Mix: 102.5 secs — ABNORMAL HIGH (ref 28.0–43.0)
PTTLA Confirmation: 35.8 secs — ABNORMAL HIGH (ref <8.0)

## 2012-10-17 LAB — HEXAGONAL PHOSPHOLIPID NEUTRALIZATION: Hex Phosph Neut Test: POSITIVE — AB

## 2012-10-18 ENCOUNTER — Encounter: Payer: Self-pay | Admitting: Internal Medicine

## 2012-10-18 NOTE — Telephone Encounter (Signed)
Spoke with patient and scheduled OV on 11/16/12 at 8:45 AM to discuss possible colonoscopy.

## 2012-10-19 ENCOUNTER — Telehealth: Payer: Self-pay | Admitting: Hematology & Oncology

## 2012-10-19 NOTE — Progress Notes (Signed)
Reviewed and agree.

## 2012-10-19 NOTE — Telephone Encounter (Signed)
I called Nicholas Owen and let him know that the confirmatory test for the lupus anticoagulant did come back positive. As such, I would like to have him on baby aspirin. This will be safe with the Xarelto. I made sure that he took the aspirin with food. He understands this.  [name]

## 2012-10-21 DIAGNOSIS — C61 Malignant neoplasm of prostate: Secondary | ICD-10-CM | POA: Diagnosis not present

## 2012-10-28 DIAGNOSIS — N529 Male erectile dysfunction, unspecified: Secondary | ICD-10-CM | POA: Diagnosis not present

## 2012-10-28 DIAGNOSIS — C61 Malignant neoplasm of prostate: Secondary | ICD-10-CM | POA: Diagnosis not present

## 2012-10-28 DIAGNOSIS — N393 Stress incontinence (female) (male): Secondary | ICD-10-CM | POA: Diagnosis not present

## 2012-11-01 ENCOUNTER — Telehealth: Payer: Self-pay | Admitting: Internal Medicine

## 2012-11-01 NOTE — Telephone Encounter (Signed)
Ok to reschedule appt to end of summer or Sept.  I suggest BMET, A1c before OV - 790.29

## 2012-11-01 NOTE — Telephone Encounter (Signed)
Pt had made lab and fup appt for his blood issue. Since then MD has referred pt to a blood specialist, Dr Arlan Organ. Pt wants to know if he needs to keep his upcoming appts.  Pt feels this may be redundant. Pls advise.

## 2012-11-04 ENCOUNTER — Other Ambulatory Visit: Payer: Medicare Other

## 2012-11-08 ENCOUNTER — Other Ambulatory Visit: Payer: Self-pay | Admitting: Internal Medicine

## 2012-11-09 ENCOUNTER — Telehealth: Payer: Self-pay | Admitting: Internal Medicine

## 2012-11-09 NOTE — Telephone Encounter (Signed)
His last colon was in 01/2011 and he had an adenom. Polyp and active colitis. He may have a recall colon 12/2012 . OK to stop the Xarelto as per Dr Myna Hidalgo.

## 2012-11-09 NOTE — Telephone Encounter (Signed)
Patient received a letter stating he did not need colonoscopy until Dec. 2014. Dr. Myna Hidalgo told him he could have it if he was off Xarelto 2 days. He would like to have the procedure before Dec.if he can. Please, advise.

## 2012-11-10 NOTE — Telephone Encounter (Signed)
Spoke with pt and he is aware. Previsit and colon scheduled, pt aware of appt dates and times.

## 2012-11-11 ENCOUNTER — Ambulatory Visit: Payer: Medicare Other | Admitting: Internal Medicine

## 2012-11-16 ENCOUNTER — Ambulatory Visit: Payer: Medicare Other | Admitting: Internal Medicine

## 2012-11-18 ENCOUNTER — Ambulatory Visit (HOSPITAL_BASED_OUTPATIENT_CLINIC_OR_DEPARTMENT_OTHER)
Admission: RE | Admit: 2012-11-18 | Discharge: 2012-11-18 | Disposition: A | Discharge status: Home / Self Care | Payer: Medicare Other | Source: Ambulatory Visit | Attending: Hematology & Oncology | Admitting: Hematology & Oncology

## 2012-11-18 ENCOUNTER — Encounter (HOSPITAL_BASED_OUTPATIENT_CLINIC_OR_DEPARTMENT_OTHER): Payer: Self-pay

## 2012-11-18 DIAGNOSIS — Z09 Encounter for follow-up examination after completed treatment for conditions other than malignant neoplasm: Secondary | ICD-10-CM | POA: Insufficient documentation

## 2012-11-18 DIAGNOSIS — R599 Enlarged lymph nodes, unspecified: Secondary | ICD-10-CM | POA: Diagnosis not present

## 2012-11-18 DIAGNOSIS — Z8546 Personal history of malignant neoplasm of prostate: Secondary | ICD-10-CM | POA: Insufficient documentation

## 2012-11-18 DIAGNOSIS — I2699 Other pulmonary embolism without acute cor pulmonale: Secondary | ICD-10-CM | POA: Diagnosis not present

## 2012-11-18 DIAGNOSIS — I82403 Acute embolism and thrombosis of unspecified deep veins of lower extremity, bilateral: Secondary | ICD-10-CM

## 2012-11-18 DIAGNOSIS — Z7901 Long term (current) use of anticoagulants: Secondary | ICD-10-CM | POA: Diagnosis not present

## 2012-11-18 MED ORDER — IOHEXOL 350 MG/ML SOLN
100.0000 mL | Freq: Once | INTRAVENOUS | Status: AC | PRN
  Administered 2012-11-18: 100 mL via INTRAVENOUS

## 2012-11-22 ENCOUNTER — Telehealth: Payer: Self-pay | Admitting: *Deleted

## 2012-11-22 NOTE — Telephone Encounter (Addendum)
Message copied by Mirian Capuchin on Mon Nov 22, 2012 12:44 PM ------      Message from: [name]      Created: Fri Nov 19, 2012  8:46 AM       Call - NO blood clot in lungs!!  [name] ------This message given to pt.  Voiced understanding.

## 2012-11-23 ENCOUNTER — Encounter: Payer: Self-pay | Admitting: *Deleted

## 2012-11-26 ENCOUNTER — Other Ambulatory Visit: Payer: Self-pay

## 2012-11-26 ENCOUNTER — Other Ambulatory Visit: Payer: Medicare Other | Admitting: Lab

## 2012-11-26 ENCOUNTER — Ambulatory Visit (HOSPITAL_BASED_OUTPATIENT_CLINIC_OR_DEPARTMENT_OTHER): Payer: Medicare Other | Admitting: Hematology & Oncology

## 2012-11-26 VITALS — BP 126/89 | HR 80 | Temp 97.8°F | Resp 16 | Wt 242.0 lb

## 2012-11-26 DIAGNOSIS — I82409 Acute embolism and thrombosis of unspecified deep veins of unspecified lower extremity: Secondary | ICD-10-CM | POA: Diagnosis not present

## 2012-11-26 DIAGNOSIS — I82403 Acute embolism and thrombosis of unspecified deep veins of lower extremity, bilateral: Secondary | ICD-10-CM

## 2012-11-26 DIAGNOSIS — R894 Abnormal immunological findings in specimens from other organs, systems and tissues: Secondary | ICD-10-CM

## 2012-11-26 DIAGNOSIS — I2699 Other pulmonary embolism without acute cor pulmonale: Secondary | ICD-10-CM

## 2012-11-26 DIAGNOSIS — R760 Raised antibody titer: Secondary | ICD-10-CM

## 2012-11-26 NOTE — Progress Notes (Signed)
This office note has been dictated.

## 2012-11-28 NOTE — Progress Notes (Signed)
CC:   Barbette Hair. Artist Pais, DO Hedwig Morton. Juanda Chance, MD  DIAGNOSIS: 1. Pulmonary embolism. 2. Lupus anticoagulant positive.  CURRENT THERAPY: 1. Xarelto 20 mg p.o. daily-2 year total therapy necessary. 2. Aspirin 81 mg p.o. daily.  INTERIM HISTORY:  Nicholas Owen comes in for his followup.  He truly does have a lupus anticoagulant.  We did a confirmatory test.  The confirmatory test was positive.  As such, I did put him on a baby aspirin. We did repeat a CT angiogram of his chest.  This did not show any residual pulmonary emboli.  He feels well.  He is still dealing with the urinary continence from his prostatectomy. He has had no bleeding. He has had no abdominal pain.  There has been no cough or shortness of breath.  PHYSICAL EXAMINATION:  General:  This is a well-developed, well- nourished, white gentleman in no obvious distress.  Vital signs: Temperature of 97.8, pulse 80, respiratory rate 16, blood pressure 126/89.  Weight is 242.  Head and neck:  Normocephalic, atraumatic skull.  There are no ocular or oral lesions.  There are no palpable cervical or supraclavicular lymph nodes.  Lungs:  Clear bilaterally. Cardiac:  Regular rate and rhythm with a normal S1, S2.  There are no murmurs, rubs or bruits.  Abdomen:  Soft with good bowel sounds.  There is no palpable abdominal mass.  There is no fluid wave.  There is no palpable hepatosplenomegaly.  Extremities:  Show no clubbing, cyanosis or edema.  No venous cord is noted in his legs.  Skin:  No rashes, ecchymosis, or petechia.  Neurological:  Shows no focal neurological deficits.  IMPRESSION:  Nicholas Owen is a very nice, 67 year old gentleman with a truly positive lupus anticoagulant.  He had bilateral pulmonary emboli. He developed this in November of 2013.  Again, I fully believe that anticoagulation would be necessary with Xarelto and then get him on full- dose aspirin afterwards.  We will plan to get him back in another 4 months so. I  do not see that we need to do any further scans on him unless he has issues.    ______________________________ [name], M.D. PRE/MEDQ  D:  11/26/2012  T:  11/28/2012  Job:  [id number]

## 2012-12-06 ENCOUNTER — Telehealth: Payer: Self-pay | Admitting: *Deleted

## 2012-12-06 NOTE — Telephone Encounter (Signed)
Patient rescheduled the procedure to 01-07-2013.  Per Dr. Myna Hidalgo, the patient can hold the Xarelto 2 days prior to the procedure.

## 2012-12-27 ENCOUNTER — Ambulatory Visit (AMBULATORY_SURGERY_CENTER): Payer: Medicare Other | Admitting: *Deleted

## 2012-12-27 VITALS — Ht 72.0 in | Wt 246.0 lb

## 2012-12-27 DIAGNOSIS — Z8719 Personal history of other diseases of the digestive system: Secondary | ICD-10-CM

## 2012-12-27 MED ORDER — MOVIPREP 100 G PO SOLR: ORAL | Status: DC

## 2012-12-27 NOTE — Progress Notes (Signed)
Patient denies any allergies to eggs or soy. Patient denies any problems with anesthesia.  

## 2012-12-29 ENCOUNTER — Other Ambulatory Visit: Payer: Self-pay | Admitting: Family

## 2013-01-05 ENCOUNTER — Other Ambulatory Visit: Payer: Self-pay

## 2013-01-07 ENCOUNTER — Encounter: Payer: Self-pay | Admitting: Internal Medicine

## 2013-01-07 ENCOUNTER — Ambulatory Visit (AMBULATORY_SURGERY_CENTER): Payer: Medicare Other | Admitting: Internal Medicine

## 2013-01-07 VITALS — BP 116/88 | HR 52 | Temp 97.9°F | Resp 19 | Ht 72.0 in | Wt 246.0 lb

## 2013-01-07 DIAGNOSIS — Z860100 Personal history of colon polyps, unspecified: Secondary | ICD-10-CM

## 2013-01-07 DIAGNOSIS — K5289 Other specified noninfective gastroenteritis and colitis: Secondary | ICD-10-CM | POA: Diagnosis not present

## 2013-01-07 DIAGNOSIS — K5190 Ulcerative colitis, unspecified, without complications: Secondary | ICD-10-CM | POA: Diagnosis not present

## 2013-01-07 DIAGNOSIS — D126 Benign neoplasm of colon, unspecified: Secondary | ICD-10-CM

## 2013-01-07 DIAGNOSIS — K529 Noninfective gastroenteritis and colitis, unspecified: Secondary | ICD-10-CM

## 2013-01-07 DIAGNOSIS — E669 Obesity, unspecified: Secondary | ICD-10-CM | POA: Diagnosis not present

## 2013-01-07 MED ORDER — SODIUM CHLORIDE 0.9 % IV SOLN: 500.0000 mL | INTRAVENOUS | Status: DC

## 2013-01-07 NOTE — Patient Instructions (Signed)
YOU HAD AN ENDOSCOPIC PROCEDURE TODAY AT THE [facility]: Refer to the procedure report that was given to you for any specific questions about what was found during the examination.  If the procedure report does not answer your questions, please call your gastroenterologist to clarify.  If you requested that your care partner not be given the details of your procedure findings, then the procedure report has been included in a sealed envelope for you to review at your convenience later.  YOU SHOULD EXPECT: Some feelings of bloating in the abdomen. Passage of more gas than usual.  Walking can help get rid of the air that was put into your GI tract during the procedure and reduce the bloating. If you had a lower endoscopy (such as a colonoscopy or flexible sigmoidoscopy) you may notice spotting of blood in your stool or on the toilet paper. If you underwent a bowel prep for your procedure, then you may not have a normal bowel movement for a few days.  DIET: Your first meal following the procedure should be a light meal and then it is ok to progress to your normal diet.  A half-sandwich or bowl of soup is an example of a good first meal.  Heavy or fried foods are harder to digest and may make you feel nauseous or bloated.  Likewise meals heavy in dairy and vegetables can cause extra gas to form and this can also increase the bloating.  Drink plenty of fluids but you should avoid alcoholic beverages for 24 hours.  ACTIVITY: Your care partner should take you home directly after the procedure.  You should plan to take it easy, moving slowly for the rest of the day.  You can resume normal activity the day after the procedure however you should NOT DRIVE or use heavy machinery for 24 hours (because of the sedation medicines used during the test).    SYMPTOMS TO REPORT IMMEDIATELY: A gastroenterologist can be reached at any hour.  During normal business hours, 8:30 AM to 5:00 PM Monday through Friday,  call [phone].  After hours and on weekends, please call the GI answering service at [phone] who will take a message and have the physician on call contact you.   Following lower endoscopy (colonoscopy or flexible sigmoidoscopy):  Excessive amounts of blood in the stool  Significant tenderness or worsening of abdominal pains  Swelling of the abdomen that is new, acute  Fever of 100F or higher    FOLLOW UP: If any biopsies were taken you will be contacted by phone or by letter within the next 1-3 weeks.  Call your gastroenterologist if you have not heard about the biopsies in 3 weeks.  Our staff will call the home number listed on your records the next business day following your procedure to check on you and address any questions or concerns that you may have at that time regarding the information given to you following your procedure. This is a courtesy call and so if there is no answer at the home number and we have not heard from you through the emergency physician on call, we will assume that you have returned to your regular daily activities without incident.  SIGNATURES/CONFIDENTIALITY: You and/or your care partner have signed paperwork which will be entered into your electronic medical record.  These signatures attest to the fact that that the information above on your After Visit Summary has been reviewed and is understood.  Full responsibility of the confidentiality   of this discharge information lies with you and/or your care-partner.  Information on polyps given to you today  Information on diverticulosis & high fiber diet given to you today

## 2013-01-07 NOTE — Progress Notes (Signed)
Procedure ends, to recovery, report given and VSS. 

## 2013-01-07 NOTE — Op Note (Signed)
[facility] [address] [place], [address]   COLONOSCOPY PROCEDURE REPORT  PATIENT: Nicholas Owen, Nicholas Owen  MR#: [id number] BIRTHDATE: [birth year] , 67  yrs. old GENDER: Male ENDOSCOPIST: Hart Carwin, MD REFERRED [id number] Artist Pais, DO PROCEDURE DATE:  01/07/2013 PROCEDURE:   Colonoscopy with cold biopsy polypectomy First Screening Colonoscopy - Avg.  risk and is 50 yrs.  old or older - No.  Prior Negative Screening - Now for repeat screening. N/A  History of Adenoma - Now for follow-up colonoscopy & has been > or = to 3 yrs.  Yes hx of adenoma.  Has been 3 or more years since last colonoscopy.  Polyps Removed Today? Yes. ASA CLASS:   Class II INDICATIONS:chronic colitis on last colonoscopy 2012, likely secondary to NSAID's, pt now asypmtomatic. MEDICATIONS: MAC sedation, administered by CRNA and propofol (Diprivan) 200mg  IV  DESCRIPTION OF PROCEDURE:   After the risks benefits and alternatives of the procedure were thoroughly explained, informed consent was obtained.  A digital rectal exam revealed no abnormalities of the rectum.   The LB [id number]  endoscope was introduced through the anus and advanced to the cecum, which was identified by both the appendix and ileocecal valve. No adverse events experienced.   The quality of the prep was good, using MoviPrep  The instrument was then slowly withdrawn as the colon was fully examined.      COLON FINDINGS: A smooth sessile polyp ranging between 3-12mm in size was found at the cecum.  A polypectomy was performed with cold forceps.  The resection was complete and the polyp tissue was completely retrieved.   Mild diverticulosis was noted throughout the entire examined colon.  There was no evidence of colitis- random biopsies of the right and transverse colon taken Retroflexed views revealed no abnormalities. The time to cecum=5 minutes 7 seconds.  Withdrawal time=6 minutes 4 seconds.  The scope was withdrawn  and the procedure completed. COMPLICATIONS: There were no complications.  ENDOSCOPIC IMPRESSION: 1.   Sessile polyp ranging between 3-61mm in size was found at the cecum; polypectomy was performed with cold forceps 2.   Mild diverticulosis was noted throughout the entire examined colon 3. no evidence of colitis- s/p random biopsies  RECOMMENDATIONS: 1.  Await pathology results 2.  High fiber diet 3. recall pending biopsies  eSigned:  Hart Carwin, MD 01/07/2013 10:08 AM   cc:   PATIENT NAME:  Nicholas Owen, Nicholas Owen MR#: [id number]

## 2013-01-07 NOTE — Progress Notes (Signed)
Called to room to assist during endoscopic procedure.  Patient ID and intended procedure confirmed with present staff. Received instructions for my participation in the procedure from the performing physician.  

## 2013-01-07 NOTE — Progress Notes (Signed)
Patient did not experience any of the following events: a burn prior to discharge; a fall within the facility; wrong site/side/patient/procedure/implant event; or a hospital transfer or hospital admission upon discharge from the facility. (G8907) Patient did not have preoperative order for IV antibiotic SSI prophylaxis. (G8918)  

## 2013-01-10 ENCOUNTER — Telehealth: Payer: Self-pay | Admitting: *Deleted

## 2013-01-10 NOTE — Telephone Encounter (Signed)
  Follow up Call-  Call back number 01/07/2013 01/23/2011  Post procedure Call Back phone  # [phone]  Permission to leave phone message Yes -     Patient questions:  Do you have a fever, pain , or abdominal swelling? yes Pain Score  0 *  Have you tolerated food without any problems? yes  Have you been able to return to your normal activities? yes  Do you have any questions about your discharge instructions: Diet   no Medications  no Follow up visit  no  Do you have questions or concerns about your Care? no  Actions: * If pain score is 4 or above: No action needed, pain <4.

## 2013-01-11 ENCOUNTER — Encounter: Payer: Self-pay | Admitting: Internal Medicine

## 2013-01-17 ENCOUNTER — Other Ambulatory Visit: Payer: Medicare Other

## 2013-01-17 ENCOUNTER — Other Ambulatory Visit: Payer: Self-pay | Admitting: *Deleted

## 2013-01-17 ENCOUNTER — Telehealth: Payer: Self-pay | Admitting: *Deleted

## 2013-01-17 DIAGNOSIS — K529 Noninfective gastroenteritis and colitis, unspecified: Secondary | ICD-10-CM

## 2013-01-17 DIAGNOSIS — K5289 Other specified noninfective gastroenteritis and colitis: Secondary | ICD-10-CM | POA: Diagnosis not present

## 2013-01-17 NOTE — Telephone Encounter (Signed)
Spoke with patient and he will come for labs. Lab in EPIC. 

## 2013-01-17 NOTE — Telephone Encounter (Signed)
Message copied by Daphine Deutscher on Mon Jan 17, 2013 10:48 AM ------      Message from: Hart Carwin      Created: Tue Jan 11, 2013  9:41 PM       Rene Kocher, I would like Mr Robitaille to have the diagnostic IBD  Panel.( the IBD markers). Please call him in next few days, I just sent him a Biopsy results letter today.      ----- Message -----         From: Lab In [facility] Interface         Sent: 01/11/2013   3:43 PM           To: Hart Carwin, MD                   ------

## 2013-01-21 ENCOUNTER — Telehealth: Payer: Self-pay | Admitting: Internal Medicine

## 2013-01-21 NOTE — Telephone Encounter (Signed)
Pt states he got a letter in the mail stating that he needed to see Dr. Juanda Chance. Pt scheduled to see Dr. Juanda Chance 02/01/13@10 :45am. Pt aware of appt. Pt wanted to know if he could stop taking his medication. Instructed him to continue taking his meds until told otherwise by Dr. Juanda Chance.

## 2013-01-25 LAB — IBD EXPANDED PANEL
ACCA: 12 units (ref 0–90)
ALCA: 40 units (ref 0–60)
gASCA: 21 units (ref 0–50)

## 2013-02-01 ENCOUNTER — Encounter: Payer: Self-pay | Admitting: Internal Medicine

## 2013-02-01 ENCOUNTER — Ambulatory Visit (INDEPENDENT_AMBULATORY_CARE_PROVIDER_SITE_OTHER): Payer: Medicare Other | Admitting: Internal Medicine

## 2013-02-01 VITALS — BP 118/60 | HR 68 | Ht 71.0 in | Wt 246.0 lb

## 2013-02-01 DIAGNOSIS — K5289 Other specified noninfective gastroenteritis and colitis: Secondary | ICD-10-CM | POA: Diagnosis not present

## 2013-02-01 DIAGNOSIS — Z800 Family history of malignant neoplasm of digestive organs: Secondary | ICD-10-CM | POA: Diagnosis not present

## 2013-02-01 NOTE — Patient Instructions (Addendum)
You will be due for a recall colonoscopy in 01/2016. We will send you a reminder in the mail when it gets closer to that time. We will also  Mail you Hemoccult cards in 1 year.  CC: Dr Artist Pais

## 2013-02-01 NOTE — Progress Notes (Signed)
Nicholas Owen July 16, 1945 MRN [id number]  History of Present Illness:  This is a 67 year old white male who comes to discuss results of  Colonoscopy. From 01/07/2013. He has a history of idiopathic colitis which was found on a colonoscopy in 2012. He at that time was taking ibuprofen up to 8 tablets a day for superficial thrombophlebitis. He has a strong family history of colon cancer in his father who died of colon cancer at age 35 after having ulcerative colitis for several years. His mother also had colon cancer at the age of 28. Patient has had numerous colonoscopies in [place], [place] and later on  in [place].  He had a tubular adenoma in 2005 and again in 2008 but no polyps on the last colonoscopy in 2014. On  random biopsies of the colon from 12/2012, he had one granuloma but otherwise normal mucosa with no evidence of inflammation. He had taken Azulfidine 1 g twice a day with folic acid for the past 2 years and has been asymptomatic. He denies diarrhea, abdominal pain or rectal bleeding. He has a history of lupus anticoagulant. He is on Xarelto for pulmonary embolus diagnosed by Dr Myna Hidalgo. He has been on Xarelto and will be switching to aspirin 325 mg next year. His IBD markers are negative.   Past Medical History  Diagnosis Date  . Allergy   . Cancer     basal cell CA (skin)  . GERD (gastroesophageal reflux disease)   . BPH (benign prostatic hyperplasia)   . Hyperlipidemia   . Asbestos exposure   . Diverticulosis   . Colitis 01/2011  . Liver cyst 10/11    4 simple cysts in liver  . 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 Basal cell carcinoma     skin of left ear  . Kidney stones   . Lipoma 12-24-11    multiple(present- lipoma 2-lt./4- rt arms/ back/ chest  . Prostate cancer 12-24-11    dx. Prostate cancer after bx.  . Deviated nasal septum     hx. of  . DVT (deep venous thrombosis)   . Ulcerative colitis   . Colon polyp    Past Surgical History   Procedure Laterality Date  . Colonoscopy    . Polypectomy    . Knee surgery  2007    right knee scope  . Tonsillectomy    . Lipoma removals  1990's    multiple and multiple remains now  . Robot assisted laparoscopic radical prostatectomy  12/31/2011    Procedure: ROBOTIC ASSISTED LAPAROSCOPIC RADICAL PROSTATECTOMY;  Surgeon: Valetta Fuller, MD;  Location: [facility];  Service: Urology;  Laterality: N/A;      . Lymph node dissection  12/31/2011    Procedure: LYMPH NODE DISSECTION;  Surgeon: Valetta Fuller, MD;  Location: [facility];  Service: Urology;  Laterality: Bilateral;  Bilateral  . Inguinal hernia repair  1990's    double  . Cystoscopy  02/16/2012    Procedure: CYSTOSCOPY FLEXIBLE;  Surgeon: Valetta Fuller, MD;  Location: [facility];  Service: Urology;  Laterality: N/A;  FLEX CYSTO WITH REMOVAL OF STAPLES     reports that he has never smoked. He has never used smokeless tobacco. He reports that  drinks alcohol. He reports that he does not use illicit drugs. family history includes Colon cancer (age of onset: 70) in his father; Colon cancer (age of onset: 28) in his mother; Prostate cancer in his  paternal grandfather. There is no history of Esophageal cancer or Stomach cancer. Allergies  Allergen Reactions  . Nsaids     colitis        Review of Systems: Negative for abdominal pain diarrhea rectal bleeding  The remainder of the 10 point ROS is negative except as outlined in H&P   Physical Exam: General appearance  Well developed, in no distress. Neurological alert and oriented x 3. Psychological normal mood and affect.  Assessment and Plan:  Problem #58 67 year old white male who is a  high risk for colorectal neoplasia having colon cancer in his father as well as in his mother and a personal history of tubular adenomas. His father had ulcerative colitis and patient had colitis himself in 2012 possibly related to high doses of ibuprofen. It is not clear whether he  has a low-grade inflammatory bowel disease . His IBD markers are negative and his mucosal biopsies are normal except for one granuloma . I have asked him to discontinue  sulfasalazine and folic acid. He is very anxious about the possibility of colon cancer and polyps and would like to have a recall colonoscopy in less than 5 years. We will put him in for a 3 year recall. We will also send him Hemoccult cards on a yearly basis.    02/01/2013 Nicholas Owen

## 2013-02-07 ENCOUNTER — Other Ambulatory Visit: Payer: Self-pay | Admitting: *Deleted

## 2013-02-07 MED ORDER — PRAVASTATIN SODIUM 20 MG PO TABS: 20.0000 mg | ORAL_TABLET | Freq: Every day | ORAL | Status: DC

## 2013-02-25 DIAGNOSIS — C61 Malignant neoplasm of prostate: Secondary | ICD-10-CM | POA: Diagnosis not present

## 2013-03-04 DIAGNOSIS — C61 Malignant neoplasm of prostate: Secondary | ICD-10-CM | POA: Diagnosis not present

## 2013-03-04 DIAGNOSIS — N393 Stress incontinence (female) (male): Secondary | ICD-10-CM | POA: Diagnosis not present

## 2013-03-04 DIAGNOSIS — N529 Male erectile dysfunction, unspecified: Secondary | ICD-10-CM | POA: Diagnosis not present

## 2013-03-18 ENCOUNTER — Ambulatory Visit (INDEPENDENT_AMBULATORY_CARE_PROVIDER_SITE_OTHER): Payer: Medicare Other

## 2013-03-18 DIAGNOSIS — Z23 Encounter for immunization: Secondary | ICD-10-CM

## 2013-03-28 ENCOUNTER — Other Ambulatory Visit (HOSPITAL_BASED_OUTPATIENT_CLINIC_OR_DEPARTMENT_OTHER): Payer: Medicare Other | Admitting: Lab

## 2013-03-28 ENCOUNTER — Ambulatory Visit (HOSPITAL_BASED_OUTPATIENT_CLINIC_OR_DEPARTMENT_OTHER): Payer: Medicare Other | Admitting: Hematology & Oncology

## 2013-03-28 VITALS — BP 129/77 | HR 55 | Temp 97.3°F | Resp 18 | Ht 71.0 in | Wt 246.0 lb

## 2013-03-28 DIAGNOSIS — R894 Abnormal immunological findings in specimens from other organs, systems and tissues: Secondary | ICD-10-CM | POA: Diagnosis not present

## 2013-03-28 DIAGNOSIS — Z8546 Personal history of malignant neoplasm of prostate: Secondary | ICD-10-CM

## 2013-03-28 DIAGNOSIS — I2699 Other pulmonary embolism without acute cor pulmonale: Secondary | ICD-10-CM

## 2013-03-28 DIAGNOSIS — R760 Raised antibody titer: Secondary | ICD-10-CM

## 2013-03-28 DIAGNOSIS — Z86718 Personal history of other venous thrombosis and embolism: Secondary | ICD-10-CM

## 2013-03-28 LAB — CBC WITH DIFFERENTIAL (CANCER CENTER ONLY)
BASO#: 0 10*3/uL (ref 0.0–0.2)
BASO%: 0.3 % (ref 0.0–2.0)
EOS%: 15.6 % — ABNORMAL HIGH (ref 0.0–7.0)
HCT: 43.7 % (ref 38.7–49.9)
HGB: 15.2 g/dL (ref 13.0–17.1)
LYMPH#: 1.3 10*3/uL (ref 0.9–3.3)
LYMPH%: 22.9 % (ref 14.0–48.0)
MCHC: 34.8 g/dL (ref 32.0–35.9)
MCV: 86 fL (ref 82–98)
NEUT%: 50.2 % (ref 40.0–80.0)
RDW: 13 % (ref 11.1–15.7)

## 2013-03-28 NOTE — Progress Notes (Signed)
This office note has been dictated.

## 2013-03-29 NOTE — Progress Notes (Signed)
DIAGNOSIS:  Pulmonary embolism secondary to lupus anticoagulant.  CURRENT THERAPY: 1. Xarelto 20 mg p.o. q. day -- to finish up therapy in November 2015. 2. Aspirin 81 mg p.o. q. day.  INTERIM HISTORY:  Nicholas Owen comes in for his followup.  We last saw him back in June.  He has been doing well since then.  He still has the urinary continence from the prostatectomy that he had. This has been a little bit of an annoyance for him, but overall, he seems to be getting through this.  He has had no bleeding problems.  He does have some bruising, which is not surprising with the Xarelto and aspirin.  He has had no cough or shortness of breath.  He has had no nausea or vomiting.  There has been no headache.  There has been no leg swelling.  PHYSICAL EXAM:  General:  This is a well-developed, well-nourished white gentleman in no obvious distress.  Vital signs:  Temperature of 97.3, pulse 55, respiratory rate 18, blood pressure 129/77.  Weight is 246 pounds.  Head and neck:  Normocephalic, atraumatic skull.  There are no ocular or oral lesions.  There are no palpable cervical or supraclavicular lymph nodes.  Lungs:  Clear bilaterally.  Cardiac: Regular rate and rhythm with a normal S1 and S2.  There are no murmurs, rubs, or bruits.  Abdomen:  Soft.  He has good bowel sounds.  There is no fluid wave.  There is no palpable abdominal mass.  There is no palpable hepatosplenomegaly.  Back:  No tenderness over the spine, ribs, or hips.  Extremities:  No clubbing, cyanosis, or edema.  He has no venous cord in his legs.  He has good range of motion of his joints. Skin:  No ecchymoses are noted.  Neurological:  No focal neurological deficits.  LABORATORY STUDIES:  White cell count is 5.7, hemoglobin 15.2, hematocrit 43.7, platelet count is 254.  IMPRESSION:  Nicholas Owen is an nice 67 year old gentleman.  He was found to have a lupus anticoagulant.  He has a mildly elevated IgG anticardiolipin  antibody.  I think that this lupus anticoagulant is for real.  We tested him twice and it was positive on both occasions.  I do believe 2 years of anticoagulation would be appropriate for him.  Back in June his D-dimer was 0.27.  I think we can probably get him back in about 4 months' time now.  I think this would be reasonable as he is doing quite well without any complications so far.    ______________________________ [name], M.D. PRE/MEDQ  D:  03/28/2013  T:  03/29/2013  Job:  [id number]

## 2013-06-17 ENCOUNTER — Other Ambulatory Visit: Payer: Self-pay | Admitting: Internal Medicine

## 2013-06-29 DIAGNOSIS — L570 Actinic keratosis: Secondary | ICD-10-CM | POA: Diagnosis not present

## 2013-06-29 DIAGNOSIS — L820 Inflamed seborrheic keratosis: Secondary | ICD-10-CM | POA: Diagnosis not present

## 2013-06-29 DIAGNOSIS — D485 Neoplasm of uncertain behavior of skin: Secondary | ICD-10-CM | POA: Diagnosis not present

## 2013-06-29 DIAGNOSIS — L940 Localized scleroderma [morphea]: Secondary | ICD-10-CM | POA: Diagnosis not present

## 2013-06-30 ENCOUNTER — Other Ambulatory Visit: Payer: Self-pay | Admitting: Internal Medicine

## 2013-06-30 DIAGNOSIS — C61 Malignant neoplasm of prostate: Secondary | ICD-10-CM | POA: Diagnosis not present

## 2013-07-07 DIAGNOSIS — N393 Stress incontinence (female) (male): Secondary | ICD-10-CM | POA: Diagnosis not present

## 2013-07-07 DIAGNOSIS — C61 Malignant neoplasm of prostate: Secondary | ICD-10-CM | POA: Diagnosis not present

## 2013-08-01 ENCOUNTER — Telehealth: Payer: Self-pay | Admitting: Internal Medicine

## 2013-08-01 ENCOUNTER — Other Ambulatory Visit: Payer: Self-pay | Admitting: Family

## 2013-08-01 NOTE — Telephone Encounter (Signed)
No need to make another appt.  I will document from lab schedule

## 2013-08-01 NOTE — Telephone Encounter (Signed)
Pt would like pneumonia vac on Tues, 4/7 when he gets his cpe labs. Is it ok to sch w/ you that day? Tues is not a dr Shawna Orleans day.

## 2013-08-30 ENCOUNTER — Telehealth: Payer: Self-pay | Admitting: Internal Medicine

## 2013-08-30 ENCOUNTER — Telehealth: Payer: Self-pay | Admitting: *Deleted

## 2013-08-30 MED ORDER — HYDROCORTISONE ACETATE 25 MG RE SUPP: RECTAL | Status: DC

## 2013-08-30 NOTE — Telephone Encounter (Signed)
Patient called stating that he has had bright red bleeding in rectal area this am.  Spoke with Dr. Marin Olp.  Dr. Marin Olp wants patient to start Colace or a stool softener.  He wants patient to Stop Xarelto for 2 days then restart. Patient understands directions.

## 2013-08-30 NOTE — Telephone Encounter (Signed)
Patient reports he had bright, red blood with stool and on tissue this AM. Reports "minor constipation" with this stool. Patient is on Xarelto. He also reports some blood from rectum when he urinated. No pain at all. Please, advise.

## 2013-08-30 NOTE — Telephone Encounter (Signed)
Last colon 12/2012- cecal polyp, I agree with stopping Xarelto x several days as per Dr Marin Olp , and send Anusol HC supp 1 hs, #12, 1 refill.

## 2013-08-30 NOTE — Telephone Encounter (Signed)
Patient given Dr. Brodie's recommendations. Rx sent to pharmacy. 

## 2013-09-05 ENCOUNTER — Other Ambulatory Visit (INDEPENDENT_AMBULATORY_CARE_PROVIDER_SITE_OTHER): Payer: Medicare Other

## 2013-09-05 ENCOUNTER — Telehealth: Payer: Self-pay | Admitting: Internal Medicine

## 2013-09-05 DIAGNOSIS — R300 Dysuria: Secondary | ICD-10-CM

## 2013-09-05 DIAGNOSIS — N138 Other obstructive and reflux uropathy: Secondary | ICD-10-CM

## 2013-09-05 DIAGNOSIS — E785 Hyperlipidemia, unspecified: Secondary | ICD-10-CM

## 2013-09-05 DIAGNOSIS — I10 Essential (primary) hypertension: Secondary | ICD-10-CM

## 2013-09-05 DIAGNOSIS — D649 Anemia, unspecified: Secondary | ICD-10-CM | POA: Diagnosis not present

## 2013-09-05 DIAGNOSIS — E039 Hypothyroidism, unspecified: Secondary | ICD-10-CM | POA: Diagnosis not present

## 2013-09-05 DIAGNOSIS — N401 Enlarged prostate with lower urinary tract symptoms: Secondary | ICD-10-CM

## 2013-09-05 LAB — POCT URINALYSIS DIPSTICK
Bilirubin, UA: NEGATIVE
Blood, UA: NEGATIVE
Glucose, UA: NEGATIVE
Ketones, UA: NEGATIVE
Leukocytes, UA: NEGATIVE
Nitrite, UA: NEGATIVE
PH UA: 5
PROTEIN UA: NEGATIVE
Spec Grav, UA: 1.025
Urobilinogen, UA: 0.2

## 2013-09-05 LAB — CBC WITH DIFFERENTIAL/PLATELET
BASOS PCT: 0.8 % (ref 0.0–3.0)
Basophils Absolute: 0 10*3/uL (ref 0.0–0.1)
Eosinophils Absolute: 0.7 10*3/uL (ref 0.0–0.7)
Eosinophils Relative: 12 % — ABNORMAL HIGH (ref 0.0–5.0)
HEMATOCRIT: 47 % (ref 39.0–52.0)
Hemoglobin: 15.9 g/dL (ref 13.0–17.0)
LYMPHS ABS: 1.5 10*3/uL (ref 0.7–4.0)
Lymphocytes Relative: 24.9 % (ref 12.0–46.0)
MCHC: 33.7 g/dL (ref 30.0–36.0)
MCV: 87.6 fl (ref 78.0–100.0)
MONO ABS: 0.6 10*3/uL (ref 0.1–1.0)
Monocytes Relative: 9.8 % (ref 3.0–12.0)
Neutro Abs: 3.1 10*3/uL (ref 1.4–7.7)
Neutrophils Relative %: 52.5 % (ref 43.0–77.0)
PLATELETS: 320 10*3/uL (ref 150.0–400.0)
RBC: 5.36 Mil/uL (ref 4.22–5.81)
RDW: 14 % (ref 11.5–14.6)
WBC: 5.9 10*3/uL (ref 4.5–10.5)

## 2013-09-05 LAB — BASIC METABOLIC PANEL
BUN: 18 mg/dL (ref 6–23)
CHLORIDE: 104 meq/L (ref 96–112)
CO2: 24 meq/L (ref 19–32)
Calcium: 9.6 mg/dL (ref 8.4–10.5)
Creatinine, Ser: 1 mg/dL (ref 0.4–1.5)
GFR: 83.24 mL/min (ref >60.00)
Glucose, Bld: 101 mg/dL — ABNORMAL HIGH (ref 70–99)
POTASSIUM: 4.7 meq/L (ref 3.5–5.1)
Sodium: 138 mEq/L (ref 135–145)

## 2013-09-05 LAB — PSA

## 2013-09-05 LAB — LIPID PANEL
CHOL/HDL RATIO: 4
Cholesterol: 164 mg/dL (ref 0–200)
HDL: 40.5 mg/dL (ref >39.00)
LDL Cholesterol: 104 mg/dL — ABNORMAL HIGH (ref 0–99)
Triglycerides: 97 mg/dL (ref 0.0–149.0)
VLDL: 19.4 mg/dL (ref 0.0–40.0)

## 2013-09-05 LAB — HEPATIC FUNCTION PANEL
ALT: 27 U/L (ref 0–53)
AST: 19 U/L (ref 0–37)
Albumin: 4.1 g/dL (ref 3.5–5.2)
Alkaline Phosphatase: 63 U/L (ref 39–117)
BILIRUBIN TOTAL: 1.4 mg/dL — AB (ref 0.3–1.2)
Bilirubin, Direct: 0.2 mg/dL (ref 0.0–0.3)
TOTAL PROTEIN: 6.6 g/dL (ref 6.0–8.3)

## 2013-09-05 LAB — TSH: TSH: 2.28 u[IU]/mL (ref 0.35–5.50)

## 2013-09-05 NOTE — Telephone Encounter (Signed)
Pt will injections when he come in for his appt on 09-12-13

## 2013-09-05 NOTE — Telephone Encounter (Signed)
Patient came in today for a lab appointment and asked while if he was here if he could have a pneumonia shot. He said he keeps getting messages about scheduling an injection, that he believes is for pneumonia. I checked with Sunday Spillers to see if she could do the injection but saw that he is also due for a TD injection as well. Just to be sure we wanted to check with you which shot(s) he needs.

## 2013-09-05 NOTE — Telephone Encounter (Signed)
Please call schedule injection appt.  It doesn't look like pt got them today

## 2013-09-06 ENCOUNTER — Other Ambulatory Visit: Payer: Medicare Other

## 2013-09-06 ENCOUNTER — Telehealth: Payer: Self-pay | Admitting: Internal Medicine

## 2013-09-06 NOTE — Telephone Encounter (Signed)
Relevant patient education assigned to patient using Emmi. ° °

## 2013-09-12 ENCOUNTER — Encounter: Payer: Self-pay | Admitting: Internal Medicine

## 2013-09-12 ENCOUNTER — Ambulatory Visit (INDEPENDENT_AMBULATORY_CARE_PROVIDER_SITE_OTHER): Payer: Medicare Other | Admitting: Internal Medicine

## 2013-09-12 VITALS — BP 124/80 | HR 68 | Temp 97.9°F | Ht 72.0 in | Wt 249.0 lb

## 2013-09-12 DIAGNOSIS — C61 Malignant neoplasm of prostate: Secondary | ICD-10-CM

## 2013-09-12 DIAGNOSIS — Z0000 Encounter for general adult medical examination without abnormal findings: Secondary | ICD-10-CM | POA: Diagnosis not present

## 2013-09-12 DIAGNOSIS — I2699 Other pulmonary embolism without acute cor pulmonale: Secondary | ICD-10-CM

## 2013-09-12 DIAGNOSIS — R7309 Other abnormal glucose: Secondary | ICD-10-CM

## 2013-09-12 DIAGNOSIS — Z23 Encounter for immunization: Secondary | ICD-10-CM

## 2013-09-12 DIAGNOSIS — E785 Hyperlipidemia, unspecified: Secondary | ICD-10-CM

## 2013-09-12 MED ORDER — OMEPRAZOLE 20 MG PO CPDR: 20.0000 mg | DELAYED_RELEASE_CAPSULE | Freq: Every day | ORAL | Status: DC

## 2013-09-12 MED ORDER — PRAVASTATIN SODIUM 20 MG PO TABS: 20.0000 mg | ORAL_TABLET | Freq: Every day | ORAL | Status: DC

## 2013-09-12 NOTE — Assessment & Plan Note (Addendum)
Reviewed adult health maintenance protocols.  See attached Medicare wellness questionnaire for details. Patient advised to followup with his ophthalmologist for routine eye exam. Patient updated with pneumovax and tetanus vaccine.  Weight loss encouraged.

## 2013-09-12 NOTE — Assessment & Plan Note (Signed)
Good response to pravastatin. Continue same dose.

## 2013-09-12 NOTE — Patient Instructions (Signed)
Goal weight loss 25 lbs over next 6 months (consider joining weight watchers) Please follow up with your Ophthalmologist for routine eye exam

## 2013-09-12 NOTE — Assessment & Plan Note (Signed)
Patient followed by hematologist. He is having issues with minor bleeding on Xarelto.  His hematologist is planning to transition him to full dose aspirin after 2 years of anticoagulation. Patient to discuss possibly using Eliquis 2.5 mg twice daily instead.

## 2013-09-12 NOTE — Progress Notes (Signed)
Subjective:    Patient ID: Nicholas Owen, male    DOB: 09-12-1945, 68 y.o.   MRN: [id number]  HPI  68 year old white male with history of pulmonary embolism, DVT, abnormal glucose and prostate cancer for routine Medicare physical. Medicare wellness questionnaire reviewed in detail. See attached form.  Prostate cancer-patient continues to struggle with urinary incontinence. He wears pads daily. He refused sling procedure in the past.  History of pulmonary embolism-patient maintained on Xarelto by hematologist. He has had minor bleeding issues (rectal bleeding and epistaxis)  Patient does not drink. She has never smoked. He does not use smokeless tobacco or illicit drugs. He does exercise 3 times a week. He is not sexually active.  He does not have any difficulty with household chores. He denies any falls within the last year. He is not afraid of falling. He denies any issues with hearing or depression.  Patient denies issues with memory or executive function.  Review of Systems  Constitutional: Negative for activity change, appetite change and unexpected weight change.  Eyes: Negative for visual disturbance.  Respiratory: Negative for cough, chest tightness and shortness of breath.   Cardiovascular: Negative for chest pain.  Genitourinary: urinary incontinence Neurological: Negative for headaches.  Gastrointestinal: Negative for abdominal pain, heartburn melena or hematochezia Psych: Negative for depression or anxiety Endo:  Erectile dysfuction.    Past Medical History  Diagnosis Date  . Allergy   . Cancer     basal cell CA (skin)  . GERD (gastroesophageal reflux disease)   . BPH (benign prostatic hyperplasia)   . Hyperlipidemia   . Asbestos exposure   . Diverticulosis   . Colitis 01/2011  . Liver cyst 10/11    4 simple cysts in liver  . 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 Basal cell carcinoma     skin of left ear  . Kidney stones   .  Lipoma 12-24-11    multiple(present- lipoma 2-lt./4- rt arms/ back/ chest  . Prostate cancer 12-24-11    dx. Prostate cancer after bx.  . Deviated nasal septum     hx. of  . DVT (deep venous thrombosis)   . Ulcerative colitis   . Colon polyp     History   Social History  . Marital Status: Married    Spouse Name: N/A    Number of Children: N/A  . Years of Education: N/A   Occupational History  . Retired    Social History Main Topics  . Smoking status: Never Smoker   . Smokeless tobacco: Never Used  . Alcohol Use: Yes     Comment: rare -1 every couple months  . Drug Use: No  . Sexual Activity: Yes   Other Topics Concern  . Not on file   Social History Narrative   Married, retired, no children         Physician roster   Delfin Edis - gastroenterology   Burney Gauze - hematology   Dr. Jocelyn Lamer - urology   Dr. Herbert Deaner - Ophthalmology    Past Surgical History  Procedure Laterality Date  . Colonoscopy    . Polypectomy    . Knee surgery  2007    right knee scope  . Tonsillectomy    . Lipoma removals  1990's    multiple and multiple remains now  . Robot assisted laparoscopic radical prostatectomy  12/31/2011    Procedure: ROBOTIC ASSISTED LAPAROSCOPIC RADICAL PROSTATECTOMY;  Surgeon: Camelia Eng  Risa Grill, MD;  Location: [facility];  Service: Urology;  Laterality: N/A;      . Lymph node dissection  12/31/2011    Procedure: LYMPH NODE DISSECTION;  Surgeon: Bernestine Amass, MD;  Location: [facility];  Service: Urology;  Laterality: Bilateral;  Bilateral  . Inguinal hernia repair  1990's    double  . Cystoscopy  02/16/2012    Procedure: CYSTOSCOPY FLEXIBLE;  Surgeon: Bernestine Amass, MD;  Location: [facility];  Service: Urology;  Laterality: N/A;  FLEX CYSTO WITH REMOVAL OF STAPLES     Family History  Problem Relation Age of Onset  . Colon cancer Mother 37  . Colon cancer Father 3  . Esophageal cancer Neg Hx   . Stomach cancer Neg Hx   . Prostate cancer Paternal  Grandfather     Allergies  Allergen Reactions  . Nsaids     colitis    Current Outpatient Prescriptions on File Prior to Visit  Medication Sig Dispense Refill  . aspirin 81 MG tablet Take 81 mg by mouth daily.      . hydrocortisone (ANUSOL-HC) 25 MG suppository Insert one rectally nights  12 suppository  1  . XARELTO 20 MG TABS tablet TAKE ONE TABLET BY MOUTH ONCE DAILY  90 tablet  1   No current facility-administered medications on file prior to visit.    BP 124/80  Pulse 68  Temp(Src) 97.9 F (36.6 C) (Oral)  Ht 6' (1.829 m)  Wt 249 lb (112.946 kg)  BMI 33.76 kg/m2      Objective:   Physical Exam  Constitutional: He is oriented to person, place, and time. He appears well-developed and well-nourished. No distress.  HENT:  Head: Normocephalic and atraumatic.  Right Ear: External ear normal.  Left Ear: External ear normal.  Mouth/Throat: Oropharynx is clear and moist.  Able hear to finger rub bilaterally  Eyes: Conjunctivae and EOM are normal. Pupils are equal, round, and reactive to light. No scleral icterus.  Neck: Neck supple.  No carotid bruit  Cardiovascular: Normal rate, regular rhythm and normal heart sounds.   No murmur heard. Pulmonary/Chest: Effort normal and breath sounds normal. He has no wheezes.  Abdominal: Soft. Bowel sounds are normal. There is no tenderness.  Musculoskeletal:  Trace lower extremity edema bilaterally  Lymphadenopathy:    He has no cervical adenopathy.  Neurological: He is alert and oriented to person, place, and time. No cranial nerve deficit.  Skin: Skin is warm and dry.  Psychiatric: He has a normal mood and affect. His behavior is normal. Judgment and thought content normal.        Assessment & Plan:

## 2013-09-12 NOTE — Progress Notes (Signed)
Pre visit review using our clinic review tool, if applicable. No additional management support is needed unless otherwise documented below in the visit note. 

## 2013-09-12 NOTE — Assessment & Plan Note (Addendum)
Patient status post robotic prostatectomy. Patient's PSA is 0.  He is having persistent issues with urinary incontinence. I advised significant weight loss. If patient having persistent issues with urinary incontinence, patient urged to reconsider sling procedure.

## 2013-09-12 NOTE — Assessment & Plan Note (Signed)
Weight loss encouraged.  Dicussed low carb diet and regular exercise.  Monitor A1c.

## 2013-09-26 ENCOUNTER — Encounter: Payer: Self-pay | Admitting: Hematology & Oncology

## 2013-09-26 ENCOUNTER — Other Ambulatory Visit (HOSPITAL_BASED_OUTPATIENT_CLINIC_OR_DEPARTMENT_OTHER): Payer: Medicare Other | Admitting: Lab

## 2013-09-26 ENCOUNTER — Ambulatory Visit (HOSPITAL_BASED_OUTPATIENT_CLINIC_OR_DEPARTMENT_OTHER): Payer: Medicare Other | Admitting: Hematology & Oncology

## 2013-09-26 VITALS — BP 116/69 | HR 56 | Temp 98.0°F | Resp 18 | Ht 70.0 in | Wt 248.0 lb

## 2013-09-26 DIAGNOSIS — I2699 Other pulmonary embolism without acute cor pulmonale: Secondary | ICD-10-CM

## 2013-09-26 DIAGNOSIS — Z86718 Personal history of other venous thrombosis and embolism: Secondary | ICD-10-CM

## 2013-09-26 DIAGNOSIS — Z7901 Long term (current) use of anticoagulants: Secondary | ICD-10-CM

## 2013-09-26 DIAGNOSIS — Z7982 Long term (current) use of aspirin: Secondary | ICD-10-CM | POA: Diagnosis not present

## 2013-09-26 DIAGNOSIS — R894 Abnormal immunological findings in specimens from other organs, systems and tissues: Secondary | ICD-10-CM | POA: Diagnosis not present

## 2013-09-26 LAB — CBC WITH DIFFERENTIAL (CANCER CENTER ONLY)
BASO#: 0 10*3/uL (ref 0.0–0.2)
BASO%: 0.6 % (ref 0.0–2.0)
EOS%: 13.1 % — ABNORMAL HIGH (ref 0.0–7.0)
Eosinophils Absolute: 0.8 10*3/uL — ABNORMAL HIGH (ref 0.0–0.5)
HEMATOCRIT: 44.9 % (ref 38.7–49.9)
HGB: 15.8 g/dL (ref 13.0–17.1)
LYMPH#: 1.3 10*3/uL (ref 0.9–3.3)
LYMPH%: 19.9 % (ref 14.0–48.0)
MCH: 29.6 pg (ref 28.0–33.4)
MCHC: 35.2 g/dL (ref 32.0–35.9)
MCV: 84 fL (ref 82–98)
MONO#: 0.6 10*3/uL (ref 0.1–0.9)
MONO%: 9.6 % (ref 0.0–13.0)
NEUT#: 3.6 10*3/uL (ref 1.5–6.5)
NEUT%: 56.8 % (ref 40.0–80.0)
Platelets: 273 10*3/uL (ref 145–400)
RBC: 5.34 10*6/uL (ref 4.20–5.70)
RDW: 13.3 % (ref 11.1–15.7)
WBC: 6.3 10*3/uL (ref 4.0–10.0)

## 2013-09-26 LAB — D-DIMER, QUANTITATIVE: D-Dimer, Quant: 0.27 ug/mL-FEU (ref 0.00–0.48)

## 2013-09-28 NOTE — Progress Notes (Signed)
Hematology and Oncology Follow Up Visit  DURWIN DAVISSON [id number] 04-08-1946 68 y.o. 09/28/2013   Principle Diagnosis:  1. Xarelto 20 mg p.o. q. day -- to finish up therapy in November 2015. 2. Aspirin 81 mg p.o. q. day.  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     Interim History:  Mr.  Mitton is back for followup. He saw him 6 months ago. He's been doing fairly well. He still has issues with the urinary continence. He had a prostatectomy. So far, there's been no problems with recurrent prostate cancer.  He's had no change in bowel habits. He's had no cough or shortness breath. He's had a problem with leg swelling. He's had no fever. He's had no weight loss or weight gain.  We last saw him in October, his D., 0.27.   Medications: Current outpatient prescriptions:aspirin 81 MG tablet, Take 81 mg by mouth daily., Disp: , Rfl: ;  loratadine (ALLERGY) 10 MG tablet, Take 10 mg by mouth daily., Disp: , Rfl: ;  omeprazole (PRILOSEC) 20 MG capsule, Take 1 capsule (20 mg total) by mouth daily., Disp: 90 capsule, Rfl: 1;  pravastatin (PRAVACHOL) 20 MG tablet, Take 1 tablet (20 mg total) by mouth daily., Disp: 90 tablet, Rfl: 1 XARELTO 20 MG TABS tablet, TAKE ONE TABLET BY MOUTH ONCE DAILY, Disp: 90 tablet, Rfl: 1;  hydrocortisone (ANUSOL-HC) 25 MG suppository, as needed. Insert one rectally nights, Disp: , Rfl:   Allergies:  Allergies  Allergen Reactions  . Nsaids     colitis    Past Medical History, Surgical history, Social history, and Family History were reviewed and updated.  Review of Systems: As above  Physical Exam:  height is 5\' 10"  (1.778 m) and weight is 248 lb (112.492 kg). His oral temperature is 98 F (36.7 C). His blood pressure is 116/69 and his pulse is 56. His respiration is 18.   Lungs are clear. Cardiac exam regular rate and rhythm. Abdomen is soft. Mildly obese. No fluid wave. No palpable liver or spleen. Neck exam no palpable  thyroid. No lymphadenopathy. Back exam no tenderness over the spine ribs or hips. Extremities shows no clubbing cyanosis or edema. No venous cord is noted. Skin exam no rashes. Neurological exam no focal deficits.  Lab Results  Component Value Date   WBC 6.3 09/26/2013   HGB 15.8 09/26/2013   HCT 44.9 09/26/2013   MCV 84 09/26/2013   PLT 273 09/26/2013     Chemistry      Component Value Date/Time   NA 138 09/05/2013 1052   K 4.7 09/05/2013 1052   CL 104 09/05/2013 1052   CO2 24 09/05/2013 1052   BUN 18 09/05/2013 1052   CREATININE 1.0 09/05/2013 1052      Component Value Date/Time   CALCIUM 9.6 09/05/2013 1052   ALKPHOS 63 09/05/2013 1052   AST 19 09/05/2013 1052   ALT 27 09/05/2013 1052   BILITOT 1.4* 09/05/2013 1052     D-dimer 0.27   Impression and Plan: Mr. Shipley is 68 year old gentleman with history of pulmonary embolism. He has a positive lupus anticoagulant that has been verified. He is on both Xarelto and aspirin. He's doing well. He's had no bleeding. He's had no thrombotic complications.  We will go ahead and plan to get him back in 6 more months. He will finish up his anticoagulation in December. At that point, we will get him on 2 baby aspirin a day.   Collier Salina  Oletha Cruel, MD 4/29/20155:47 PM

## 2013-10-26 DIAGNOSIS — H35369 Drusen (degenerative) of macula, unspecified eye: Secondary | ICD-10-CM | POA: Diagnosis not present

## 2013-10-26 DIAGNOSIS — H2510 Age-related nuclear cataract, unspecified eye: Secondary | ICD-10-CM | POA: Diagnosis not present

## 2013-10-26 DIAGNOSIS — H524 Presbyopia: Secondary | ICD-10-CM | POA: Diagnosis not present

## 2013-10-26 DIAGNOSIS — H179 Unspecified corneal scar and opacity: Secondary | ICD-10-CM | POA: Diagnosis not present

## 2013-10-28 DIAGNOSIS — C61 Malignant neoplasm of prostate: Secondary | ICD-10-CM | POA: Diagnosis not present

## 2013-11-04 DIAGNOSIS — N393 Stress incontinence (female) (male): Secondary | ICD-10-CM | POA: Diagnosis not present

## 2013-11-04 DIAGNOSIS — C61 Malignant neoplasm of prostate: Secondary | ICD-10-CM | POA: Diagnosis not present

## 2014-01-05 ENCOUNTER — Other Ambulatory Visit: Payer: Self-pay | Admitting: Family

## 2014-01-19 ENCOUNTER — Telehealth: Payer: Self-pay | Admitting: *Deleted

## 2014-01-19 NOTE — Telephone Encounter (Signed)
Message copied by Larina Bras on Thu Jan 19, 2014  8:54 AM ------      Message from: Larina Bras      Created: Mon Jan 16, 2014  9:02 AM                   ----- Message -----         From: Larina Bras, CMA         Sent: 01/13/2014           To: Larina Bras, CMA            Left message for patient to call back.       ----- Message -----         From: Larina Bras, CMA         Sent: 01/11/2014           To: Larina Bras, CMA                        ----- Message -----         From: Larina Bras, CMA         Sent: 01/10/2014   8:15 AM           To: Larina Bras, CMA            Left message to call back.             ----- Message -----         From: Larina Bras, CMA         Sent: 01/10/2014           To: Larina Bras, CMA            Pt needs yearly hemoccults... See 02/01/13 office note             ------

## 2014-01-19 NOTE — Telephone Encounter (Signed)
I have spoken to patient and have advised that he is due for hemoccult cards. He verbalizes understanding and I have mailed these to his home address.

## 2014-02-01 ENCOUNTER — Encounter: Payer: Self-pay | Admitting: Family Medicine

## 2014-02-01 ENCOUNTER — Ambulatory Visit (INDEPENDENT_AMBULATORY_CARE_PROVIDER_SITE_OTHER): Payer: Medicare Other | Admitting: Family Medicine

## 2014-02-01 ENCOUNTER — Ambulatory Visit: Payer: Medicare Other | Admitting: Physician Assistant

## 2014-02-01 VITALS — BP 110/70 | HR 72 | Temp 99.0°F | Ht 70.0 in | Wt 233.0 lb

## 2014-02-01 DIAGNOSIS — J988 Other specified respiratory disorders: Secondary | ICD-10-CM

## 2014-02-01 DIAGNOSIS — J40 Bronchitis, not specified as acute or chronic: Secondary | ICD-10-CM | POA: Diagnosis not present

## 2014-02-01 MED ORDER — PREDNISONE 20 MG PO TABS: 40.0000 mg | ORAL_TABLET | Freq: Every day | ORAL | Status: DC

## 2014-02-01 MED ORDER — DOXYCYCLINE HYCLATE 100 MG PO TABS: 100.0000 mg | ORAL_TABLET | Freq: Two times a day (BID) | ORAL | Status: DC

## 2014-02-01 MED ORDER — ALBUTEROL SULFATE HFA 108 (90 BASE) MCG/ACT IN AERS: 2.0000 | INHALATION_SPRAY | Freq: Four times a day (QID) | RESPIRATORY_TRACT | Status: DC | PRN

## 2014-02-01 MED ORDER — PREDNISONE 20 MG PO TABS: 20.0000 mg | ORAL_TABLET | Freq: Every day | ORAL | Status: DC

## 2014-02-01 NOTE — Patient Instructions (Signed)
-  try the albuterol 2 puffs before bed tonight - if helps and doing better can hold on the other medications  -if not, start the other medications and complete  -follow up if worsening or persists or other concerns

## 2014-02-01 NOTE — Progress Notes (Signed)
Pre visit review using our clinic review tool, if applicable. No additional management support is needed unless otherwise documented below in the visit note. 

## 2014-02-01 NOTE — Progress Notes (Signed)
No chief complaint on file.   HPI:  -started: 9-10 days ago, but cough persisting mostly at night -symptoms:nasal congestion, sore throat, cough - productive, a little hoarseness, a little wheeze a few times, sore throat - worse part is drainage and cough at night, thick nasal congestion -denies:fever, SOB, NVD, tooth pain -has tried: musinex -sick contacts/travel/risks: denies flu exposure, tick exposure or or Ebola risks -Hx of: allergies, GERD ROS: See pertinent positives and negatives per HPI.  Past Medical History  Diagnosis Date  . Allergy   . Cancer     basal cell CA (skin)  . GERD (gastroesophageal reflux disease)   . BPH (benign prostatic hyperplasia)   . Hyperlipidemia   . Asbestos exposure   . Diverticulosis   . Colitis 01/2011  . Liver cyst 10/11    4 simple cysts in liver  . 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 Basal cell carcinoma     skin of left ear  . Kidney stones   . Lipoma 12-24-11    multiple(present- lipoma 2-lt./4- rt arms/ back/ chest  . Prostate cancer 12-24-11    dx. Prostate cancer after bx.  . Deviated nasal septum     hx. of  . DVT (deep venous thrombosis)   . Ulcerative colitis   . Colon polyp     Past Surgical History  Procedure Laterality Date  . Colonoscopy    . Polypectomy    . Knee surgery  2007    right knee scope  . Tonsillectomy    . Lipoma removals  1990's    multiple and multiple remains now  . Robot assisted laparoscopic radical prostatectomy  12/31/2011    Procedure: ROBOTIC ASSISTED LAPAROSCOPIC RADICAL PROSTATECTOMY;  Surgeon: Bernestine Amass, MD;  Location: [facility];  Service: Urology;  Laterality: N/A;      . Lymph node dissection  12/31/2011    Procedure: LYMPH NODE DISSECTION;  Surgeon: Bernestine Amass, MD;  Location: [facility];  Service: Urology;  Laterality: Bilateral;  Bilateral  . Inguinal hernia repair  1990's    double  . Cystoscopy  02/16/2012    Procedure: CYSTOSCOPY FLEXIBLE;  Surgeon:  Bernestine Amass, MD;  Location: [facility];  Service: Urology;  Laterality: N/A;  FLEX CYSTO WITH REMOVAL OF STAPLES     Family History  Problem Relation Age of Onset  . Colon cancer Mother 33  . Colon cancer Father 35  . Esophageal cancer Neg Hx   . Stomach cancer Neg Hx   . Prostate cancer Paternal Grandfather     History   Social History  . Marital Status: Married    Spouse Name: N/A    Number of Children: N/A  . Years of Education: N/A   Occupational History  . Retired    Social History Main Topics  . Smoking status: Never Smoker   . Smokeless tobacco: Never Used     Comment: never used tobacco  . Alcohol Use: Yes     Comment: rare -1 every couple months  . Drug Use: No  . Sexual Activity: Yes   Other Topics Concern  . None   Social History Narrative   Married, retired, no children         Physician roster   Delfin Edis - gastroenterology   Burney Gauze - hematology   Dr. Jocelyn Lamer - urology   Dr. Herbert Deaner - Ophthalmology    Current outpatient prescriptions:aspirin [address]  MG tablet, Take 81 mg by mouth daily., Disp: , Rfl: ;  omeprazole (PRILOSEC) 20 MG capsule, Take 1 capsule (20 mg total) by mouth daily., Disp: 90 capsule, Rfl: 1;  pravastatin (PRAVACHOL) 20 MG tablet, Take 1 tablet (20 mg total) by mouth daily., Disp: 90 tablet, Rfl: 1;  XARELTO 20 MG TABS tablet, TAKE ONE TABLET BY MOUTH ONCE DAILY, Disp: 90 tablet, Rfl: 0 albuterol (PROVENTIL HFA;VENTOLIN HFA) 108 (90 BASE) MCG/ACT inhaler, Inhale 2 puffs into the lungs every 6 (six) hours as needed., Disp: 1 Inhaler, Rfl: 0;  doxycycline (VIBRA-TABS) 100 MG tablet, Take 1 tablet (100 mg total) by mouth 2 (two) times daily., Disp: 20 tablet, Rfl: 0;  predniSONE (DELTASONE) 20 MG tablet, Take 2 tablets (40 mg total) by mouth daily with breakfast., Disp: 6 tablet, Rfl: 0  EXAM:  Filed Vitals:   02/01/14 1122  BP: 110/70  Pulse: 72  Temp: 99 F (37.2 C)  O2 97% on RA  Body mass index is 33.43  kg/(m^2).  GENERAL: vitals reviewed and listed above, alert, oriented, appears well hydrated and in no acute distress  HEENT: atraumatic, conjunttiva clear, no obvious abnormalities on inspection of external nose and ears, normal appearance of ear canals and TMs, clear nasal congestion, mild post oropharyngeal erythema with PND, no tonsillar edema or exudate, no sinus TTP  NECK: no obvious masses on inspection  LUNGS: clear to auscultation bilaterally, no wheezes, rales or rhonchi, good air movement  CV: HRRR, no peripheral edema  MS: moves all extremities without noticeable abnormality  PSYCH: pleasant and cooperative, no obvious depression or anxiety  ASSESSMENT AND PLAN:  Discussed the following assessment and plan:  Respiratory infection - Plan: albuterol (PROVENTIL HFA;VENTOLIN HFA) 108 (90 BASE) MCG/ACT inhaler  Bronchitis - Plan: doxycycline (VIBRA-TABS) 100 MG tablet, DISCONTINUED: predniSONE (DELTASONE) 20 MG tablet  -given HPI and exam findings today, a serious infection or illness is unlikely. We discussed potential etiologies, with VURI being most likely, and advised supportive care and monitoring. We discussed treatment side effects, likely course, antibiotic misuse, transmission, and signs of developing a serious illness. -given persistent cough, ? Bronchitis offered alb, prednisone, he prefers not to do meds if possible, but company coming into town, wants abx too incase persists over weekend  -of course, we advised to return or notify a doctor immediately if symptoms worsen or persist or new concerns arise.    Patient Instructions  -try the albuterol 2 puffs before bed tonight - if helps and doing better can hold on the other medications  -if not, start the other medications and complete  -follow up if worsening or persists or other concerns     Nicholas Flamenco R.

## 2014-02-13 ENCOUNTER — Encounter: Payer: Self-pay | Admitting: Family Medicine

## 2014-02-13 ENCOUNTER — Ambulatory Visit (INDEPENDENT_AMBULATORY_CARE_PROVIDER_SITE_OTHER): Payer: Medicare Other | Admitting: Family Medicine

## 2014-02-13 VITALS — BP 120/80 | HR 80 | Temp 98.8°F | Wt 232.0 lb

## 2014-02-13 DIAGNOSIS — R1032 Left lower quadrant pain: Secondary | ICD-10-CM

## 2014-02-13 DIAGNOSIS — K5732 Diverticulitis of large intestine without perforation or abscess without bleeding: Secondary | ICD-10-CM

## 2014-02-13 MED ORDER — CIPROFLOXACIN HCL 500 MG PO TABS: 500.0000 mg | ORAL_TABLET | Freq: Two times a day (BID) | ORAL | Status: DC

## 2014-02-13 MED ORDER — METRONIDAZOLE 500 MG PO TABS: 500.0000 mg | ORAL_TABLET | Freq: Three times a day (TID) | ORAL | Status: DC

## 2014-02-13 NOTE — Progress Notes (Signed)
[name], MD Phone: [phone]  Subjective:   Nicholas Owen is a 68 y.o. year old very pleasant male patient who presents with the following:  Lower Abdominal Pain /left groin pain Pain feels like hernia did in the past but above the area of previous pain. Described as bad ache. Has had inguinal hernia repair bilateral many years ago (25 years). Pain Started 2-3 days ago but intensified yesterday into last night. Pain worse with movement up to 7-8/10. Does not feel a bulge or see a bulge. Pain not constant. Pain in left lower quadrant about 5 cm away from umbilicus. ROS- regular daily bowel movements (no pain associated with this), no BRBPR and no melena, no nausea/vomiting. Mild polyuria but not increased from baseline. No dysuria. No penile discharge. Does have leakage which is chronic. No blood in urine. No fevers/chills.   Past Medical History-history DVT and PE on xarelto, known diverticulosis on colonoscopy, history of prostate cancer status post removal of prostate, history kidney stones, hyperlipidemia, GERD Surgical history-prostatectomy several years ago but has done well since that time.   Medications- reviewed and updated Current Outpatient Prescriptions  Medication Sig Dispense Refill  . aspirin 81 MG tablet Take 81 mg by mouth daily.      Marland Kitchen omeprazole (PRILOSEC) 20 MG capsule Take 1 capsule (20 mg total) by mouth daily.  90 capsule  1  . pravastatin (PRAVACHOL) 20 MG tablet Take 1 tablet (20 mg total) by mouth daily.  90 tablet  1  . XARELTO 20 MG TABS tablet TAKE ONE TABLET BY MOUTH ONCE DAILY  90 tablet  0  . albuterol (PROVENTIL HFA;VENTOLIN HFA) 108 (90 BASE) MCG/ACT inhaler Inhale 2 puffs into the lungs every 6 (six) hours as needed.  1 Inhaler  0  . ciprofloxacin (CIPRO) 500 MG tablet Take 1 tablet (500 mg total) by mouth 2 (two) times daily.  20 tablet  0  . metroNIDAZOLE (FLAGYL) 500 MG tablet Take 1 tablet (500 mg total) by mouth 3 (three) times daily.  30  tablet  0   No current facility-administered medications for this visit.    Objective: BP 120/80  Pulse 80  Temp(Src) 98.8 F (37.1 C)  Wt 232 lb (105.235 kg) Gen: NAD, resting comfortably in chair CV: RRR no murmurs rubs or gallops Lungs: CTAB no crackles, wheeze, rhonchi Abdomen: soft/tender in LLQ diagonally about 5 cm from navel with pain in this area as well with palpation RLQ/nondistended/normal bowel sounds. No rebound or guarding.  Ext: no edema, 2+ PT pulses Skin: warm, dry, no rash over abdomen, former surgical scars noted  Assessment/Plan:  Left lower quadrant abdominal pain Given age and known history of diverticulosis as well as pain in left lower quadrant per history and on exam, strongly suspect this is diverticulitis. Prescription given for ciprofloxacin and Flagyl for ten-day course. Advised clear liquid diet but patient may liberalize some. Appendicitis in the differential if his appendix lies to the left but I doubt this. Abdomen nonacute. Doubt rupture of diverticula. CBC and CMET as well as UA ordered, unclear why this was not obtained before the patient left, I will have Clyde Lundborg, CMA call the patient to make sure these are obtained. Planned followup on Wednesday or Thursday with either myself or Dr. Shawna Orleans. No problem-specific assessment & plan notes found for this encounter.   Orders Placed This Encounter  Procedures  . CBC with Differential  . Comprehensive metabolic panel    [place]  . POCT urinalysis  dipstick    Meds ordered this encounter  Medications  . ciprofloxacin (CIPRO) 500 MG tablet    Sig: Take 1 tablet (500 mg total) by mouth 2 (two) times daily.    Dispense:  20 tablet    Refill:  0  . metroNIDAZOLE (FLAGYL) 500 MG tablet    Sig: Take 1 tablet (500 mg total) by mouth 3 (three) times daily.    Dispense:  30 tablet    Refill:  0

## 2014-02-13 NOTE — Progress Notes (Signed)
Pt coming back this evening for labs

## 2014-02-13 NOTE — Patient Instructions (Addendum)
See me back on Wednesday or Thursday to see how you are doing or sooner if symptoms worsen. Try to go easy on diet (liquid clears probably easiest on belly).  If you had worsening abdominal pain or fevers you can call our after hours line or seek care.   Diverticulitis Diverticulitis is inflammation or infection of small pouches in your colon that form when you have a condition called diverticulosis. The pouches in your colon are called diverticula. Your colon, or large intestine, is where water is absorbed and stool is formed. Complications of diverticulitis can include:  Bleeding.  Severe infection.  Severe pain.  Perforation of your colon.  Obstruction of your colon. CAUSES  Diverticulitis is caused by bacteria. Diverticulitis happens when stool becomes trapped in diverticula. This allows bacteria to grow in the diverticula, which can lead to inflammation and infection. RISK FACTORS People with diverticulosis are at risk for diverticulitis. Eating a diet that does not include enough fiber from fruits and vegetables may make diverticulitis more likely to develop. SYMPTOMS  Symptoms of diverticulitis may include:  Abdominal pain and tenderness. The pain is normally located on the left side of the abdomen, but may occur in other areas.  Fever and chills.  Bloating.  Cramping.  Nausea.  Vomiting.  Constipation.  Diarrhea.  Blood in your stool. DIAGNOSIS  Your health care provider will ask you about your medical history and do a physical exam. You may need to have tests done because many medical conditions can cause the same symptoms as diverticulitis. Tests may include:  Blood tests.  Urine tests.  Imaging tests of the abdomen, including X-rays and CT scans. When your condition is under control, your health care provider may recommend that you have a colonoscopy. A colonoscopy can show how severe your diverticula are and whether something else is causing your  symptoms. TREATMENT  Most cases of diverticulitis are mild and can be treated at home. Treatment may include:  Taking over-the-counter pain medicines.  Following a clear liquid diet.  Taking antibiotic medicines by mouth for 7-10 days. More severe cases may be treated at a hospital. Treatment may include:  Not eating or drinking.  Taking prescription pain medicine.  Receiving antibiotic medicines through an IV tube.  Receiving fluids and nutrition through an IV tube.  Surgery. HOME CARE INSTRUCTIONS   Follow your health care provider's instructions carefully.  Follow a full liquid diet or other diet as directed by your health care provider. After your symptoms improve, your health care provider may tell you to change your diet. He or she may recommend you eat a high-fiber diet. Fruits and vegetables are good sources of fiber. Fiber makes it easier to pass stool.  Take fiber supplements or probiotics as directed by your health care provider.  Only take medicines as directed by your health care provider.  Keep all your follow-up appointments. SEEK MEDICAL CARE IF:   Your pain does not improve.  You have a hard time eating food.  Your bowel movements do not return to normal. SEEK IMMEDIATE MEDICAL CARE IF:   Your pain becomes worse.  Your symptoms do not get better.  Your symptoms suddenly get worse.  You have a fever.  You have repeated vomiting.  You have bloody or black, tarry stools. MAKE SURE YOU:   Understand these instructions.  Will watch your condition.  Will get help right away if you are not doing well or get worse. Document Released: 02/26/2005 Document Revised: 05/24/2013 Document  Reviewed: 04/13/2013 Exit[employer] Patient Information 2015 [place], [place]. This information is not intended to replace advice given to you by your health care provider. Make sure you discuss any questions you have with your health care provider.

## 2014-02-14 DIAGNOSIS — R1032 Left lower quadrant pain: Secondary | ICD-10-CM | POA: Diagnosis not present

## 2014-02-14 LAB — COMPREHENSIVE METABOLIC PANEL
ALBUMIN: 3.7 g/dL (ref 3.5–5.2)
ALK PHOS: 66 U/L (ref 39–117)
ALT: 20 U/L (ref 0–53)
AST: 19 U/L (ref 0–37)
BUN: 15 mg/dL (ref 6–23)
CO2: 26 meq/L (ref 19–32)
Calcium: 9.1 mg/dL (ref 8.4–10.5)
Chloride: 104 mEq/L (ref 96–112)
Creatinine, Ser: 1.1 mg/dL (ref 0.4–1.5)
GFR: 74.15 mL/min (ref >60.00)
GLUCOSE: 103 mg/dL — AB (ref 70–99)
POTASSIUM: 3.8 meq/L (ref 3.5–5.1)
SODIUM: 139 meq/L (ref 135–145)
Total Bilirubin: 1.2 mg/dL (ref 0.2–1.2)
Total Protein: 6.8 g/dL (ref 6.0–8.3)

## 2014-02-14 LAB — POCT URINALYSIS DIPSTICK
BILIRUBIN UA: NEGATIVE
Glucose, UA: NEGATIVE
Nitrite, UA: NEGATIVE
PROTEIN UA: NEGATIVE
RBC UA: NEGATIVE
Spec Grav, UA: 1.025
Urobilinogen, UA: 0.2
pH, UA: 5

## 2014-02-14 NOTE — Addendum Note (Signed)
Addended by: Elmer Picker on: 02/14/2014 01:18 PM   Modules accepted: Orders

## 2014-02-15 LAB — CBC WITH DIFFERENTIAL/PLATELET
BASOS ABS: 0.1 10*3/uL (ref 0.0–0.1)
Basophils Relative: 0.7 % (ref 0.0–3.0)
EOS PCT: 6.1 % — AB (ref 0.0–5.0)
Eosinophils Absolute: 0.5 10*3/uL (ref 0.0–0.7)
HCT: 43.1 % (ref 39.0–52.0)
HEMOGLOBIN: 14.6 g/dL (ref 13.0–17.0)
Lymphocytes Relative: 15.2 % (ref 12.0–46.0)
Lymphs Abs: 1.2 10*3/uL (ref 0.7–4.0)
MCHC: 33.8 g/dL (ref 30.0–36.0)
MCV: 87.9 fl (ref 78.0–100.0)
MONO ABS: 0.3 10*3/uL (ref 0.1–1.0)
MONOS PCT: 3.8 % (ref 3.0–12.0)
NEUTROS ABS: 6.1 10*3/uL (ref 1.4–7.7)
Neutrophils Relative %: 74.2 % (ref 43.0–77.0)
PLATELETS: 367 10*3/uL (ref 150.0–400.0)
RBC: 4.9 Mil/uL (ref 4.22–5.81)
RDW: 13.8 % (ref 11.5–15.5)
WBC: 8.2 10*3/uL (ref 4.0–10.5)

## 2014-02-16 ENCOUNTER — Ambulatory Visit (INDEPENDENT_AMBULATORY_CARE_PROVIDER_SITE_OTHER): Payer: Medicare Other | Admitting: Family Medicine

## 2014-02-16 ENCOUNTER — Encounter: Payer: Self-pay | Admitting: Family Medicine

## 2014-02-16 VITALS — BP 118/78 | HR 72 | Wt 232.0 lb

## 2014-02-16 DIAGNOSIS — K5732 Diverticulitis of large intestine without perforation or abscess without bleeding: Secondary | ICD-10-CM | POA: Diagnosis not present

## 2014-02-16 DIAGNOSIS — R1032 Left lower quadrant pain: Secondary | ICD-10-CM

## 2014-02-16 LAB — URINE CULTURE
Colony Count: NO GROWTH
Organism ID, Bacteria: NO GROWTH

## 2014-02-16 NOTE — Progress Notes (Signed)
  [name], MD Phone: [phone]  Subjective:   Nicholas Owen is a 68 y.o. year old very pleasant male patient who presents with the following:  Lower Abdominal Pain / presumed diverticulitis  Pain improving. Able to sleep through night now. Pain now 4-5/10 with occasional wave if moves particular way. Still described as bad ache.  Able to take antibiotics. Able to be active during the day. Current pain 0/10. Still no bulge. Patient states he is not used to pain and was worried enough to pack an overnight bag at last visit and also brought one today as well just in case he had to get imaging then go to hospital.   ROS- regular daily bowel movements (no pain associated with this) but mildly loose, no BRBPR and no melena, no nausea/vomiting. Mild polyuria but not increased from baseline. No dysuria. No penile discharge. Does have leakage which is chronic. No blood in urine. No fevers/chills.   Past Medical History-history DVT and PE on xarelto, known diverticulosis on colonoscopy, history of prostate cancer status post removal of prostate, history kidney stones, hyperlipidemia, GERD Surgical history-prostatectomy several years ago but has done well since that time.   Medications- reviewed and updated Current Outpatient Prescriptions  Medication Sig Dispense Refill  . aspirin 81 MG tablet Take 81 mg by mouth daily.      . ciprofloxacin (CIPRO) 500 MG tablet Take 1 tablet (500 mg total) by mouth 2 (two) times daily.  20 tablet  0  . metroNIDAZOLE (FLAGYL) 500 MG tablet Take 1 tablet (500 mg total) by mouth 3 (three) times daily.  30 tablet  0  . omeprazole (PRILOSEC) 20 MG capsule Take 1 capsule (20 mg total) by mouth daily.  90 capsule  1  . pravastatin (PRAVACHOL) 20 MG tablet Take 1 tablet (20 mg total) by mouth daily.  90 tablet  1  . XARELTO 20 MG TABS tablet TAKE ONE TABLET BY MOUTH ONCE DAILY  90 tablet  0  . albuterol (PROVENTIL HFA;VENTOLIN HFA) 108 (90 BASE) MCG/ACT inhaler  Inhale 2 puffs into the lungs every 6 (six) hours as needed.  1 Inhaler  0   No current facility-administered medications for this visit.    Objective: BP 118/78  Pulse 72  Wt 232 lb (105.235 kg) Gen: NAD, resting comfortably in chair. Well appearing CV: RRR no murmurs rubs or gallops Lungs: CTAB no crackles, wheeze, rhonchi Abdomen: soft/mildly tender in LLQ diagonally about 5 cm from navel. Radiation of pain from RLQ palpation to LLQ now minimal. No rebound or guarding.  Ext: no edema, 2+ PT pulses Skin: warm, dry, no rash over abdomen, former surgical scars noted  Assessment/Plan:  Left lower quadrant abdominal pain/Presumed diverticulitis About 50% improvement and now sleeping through night. I told patient I had hoped for slightly more rapid improvement so would like to have him follow up early next week. If pain not at least 75% improved from original pain, would consider imaging given almost pinpoint nature of pain. Finish cipro/flagyl 10 day course. Reasons for sooner return discussed.   Urine culture came back negative. CBC without WBC elevation (but after 1 day antibiotics)

## 2014-02-16 NOTE — Patient Instructions (Signed)
See me or Dr. Shawna Orleans back on Monday or Tuesday to see how you are doing. Sooner if symptoms worsen. hIf you had worsening abdominal pain or fevers you can call our after hours line or seek care.

## 2014-02-20 ENCOUNTER — Telehealth: Payer: Self-pay

## 2014-02-20 ENCOUNTER — Other Ambulatory Visit: Payer: Medicare Other

## 2014-02-20 DIAGNOSIS — K5732 Diverticulitis of large intestine without perforation or abscess without bleeding: Secondary | ICD-10-CM

## 2014-02-20 NOTE — Telephone Encounter (Signed)
Error

## 2014-02-21 ENCOUNTER — Other Ambulatory Visit (INDEPENDENT_AMBULATORY_CARE_PROVIDER_SITE_OTHER): Payer: Medicare Other

## 2014-02-21 ENCOUNTER — Other Ambulatory Visit: Payer: Self-pay | Admitting: Family Medicine

## 2014-02-21 ENCOUNTER — Ambulatory Visit (INDEPENDENT_AMBULATORY_CARE_PROVIDER_SITE_OTHER): Payer: Medicare Other | Admitting: Family Medicine

## 2014-02-21 ENCOUNTER — Encounter: Payer: Self-pay | Admitting: Family Medicine

## 2014-02-21 VITALS — BP 102/80 | Temp 98.1°F | Wt 235.0 lb

## 2014-02-21 DIAGNOSIS — K5732 Diverticulitis of large intestine without perforation or abscess without bleeding: Secondary | ICD-10-CM | POA: Diagnosis not present

## 2014-02-21 DIAGNOSIS — K529 Noninfective gastroenteritis and colitis, unspecified: Secondary | ICD-10-CM

## 2014-02-21 NOTE — Progress Notes (Signed)
  [name], MD Phone: [phone]  Subjective:   Nicholas Owen is a 68 y.o. year old very pleasant male patient who presents with the following:  Diverticulitis  Pain now 0/10 for last 2 days except for 1x a few nights ago and when he pressed on area last night. Sleeping well. Active during the day. Tolerating antibiotics.   ROS- regular daily bowel movements (no pain associated with this) but mildly loose, no BRBPR and no melena, no nausea/vomiting.No blood in urine. No fevers/chills.   Past Medical History-history DVT and PE on xarelto, known diverticulosis on colonoscopy, history of prostate cancer status post removal of prostate, history kidney stones, hyperlipidemia, GERD Surgical history-prostatectomy several years ago but has done well since that time.   Medications- reviewed and updated Current Outpatient Prescriptions  Medication Sig Dispense Refill  . aspirin 81 MG tablet Take 81 mg by mouth daily.      . ciprofloxacin (CIPRO) 500 MG tablet Take 1 tablet (500 mg total) by mouth 2 (two) times daily.  20 tablet  0  . metroNIDAZOLE (FLAGYL) 500 MG tablet Take 1 tablet (500 mg total) by mouth 3 (three) times daily.  30 tablet  0  . omeprazole (PRILOSEC) 20 MG capsule Take 1 capsule (20 mg total) by mouth daily.  90 capsule  1  . pravastatin (PRAVACHOL) 20 MG tablet Take 1 tablet (20 mg total) by mouth daily.  90 tablet  1  . XARELTO 20 MG TABS tablet TAKE ONE TABLET BY MOUTH ONCE DAILY  90 tablet  0  . albuterol (PROVENTIL HFA;VENTOLIN HFA) 108 (90 BASE) MCG/ACT inhaler Inhale 2 puffs into the lungs every 6 (six) hours as needed.  1 Inhaler  0   No current facility-administered medications for this visit.    Objective: BP 102/80  Temp(Src) 98.1 F (36.7 C)  Wt 235 lb (106.595 kg) Gen: NAD, resting comfortably in chair. Well appearing CV: RRR no murmurs rubs or gallops Lungs: CTAB no crackles, wheeze, rhonchi Abdomen: soft/mildly tender in LLQ diagonally about 5  cm from navel but only with deep palpation.  No rebound or guarding.  Ext: no edema,  Skin: warm, dry, no rash over abdomen, former surgical scars noted  Assessment/Plan:  Diverticulitis Pain completely gone except with palpation. Discussed may take time for inflammation to subside even when infection gone (did encourage to complete last 2 days of antibiotics cipro/flagyl). Discussed reasons for return but no planned/scheduled follow up given near complete resolution of symptoms.

## 2014-02-21 NOTE — Addendum Note (Signed)
Addended by: Townsend Roger D on: 02/21/2014 04:36 PM   Modules accepted: Orders

## 2014-02-21 NOTE — Patient Instructions (Signed)
Finish antibiotics. 2 more days should eradicate infection completely.   Pain with pressing may linger over next 1-2 weeks but should resolve after that. Pain should not flare up and you should not have fevers, if either of those occur, come see [place].   Otherwise, return to your regular care schedule.

## 2014-02-22 LAB — HEMOCCULT SLIDES (X 3 CARDS)
Fecal Occult Blood: NEGATIVE
OCCULT 1: POSITIVE — AB
OCCULT 2: POSITIVE — AB
OCCULT 3: POSITIVE — AB
OCCULT 4: POSITIVE — AB
OCCULT 5: POSITIVE — AB

## 2014-03-07 ENCOUNTER — Other Ambulatory Visit (INDEPENDENT_AMBULATORY_CARE_PROVIDER_SITE_OTHER): Payer: Medicare Other

## 2014-03-07 DIAGNOSIS — Z23 Encounter for immunization: Secondary | ICD-10-CM | POA: Diagnosis not present

## 2014-03-07 DIAGNOSIS — R739 Hyperglycemia, unspecified: Secondary | ICD-10-CM

## 2014-03-07 LAB — BASIC METABOLIC PANEL
BUN: 17 mg/dL (ref 6–23)
CO2: 23 mEq/L (ref 19–32)
Calcium: 8.7 mg/dL (ref 8.4–10.5)
Chloride: 105 mEq/L (ref 96–112)
Creatinine, Ser: 0.8 mg/dL (ref 0.4–1.5)
GFR: 101.12 mL/min (ref >60.00)
Glucose, Bld: 92 mg/dL (ref 70–99)
POTASSIUM: 3.9 meq/L (ref 3.5–5.1)
SODIUM: 136 meq/L (ref 135–145)

## 2014-03-07 LAB — HEMOGLOBIN A1C: HEMOGLOBIN A1C: 5.8 % (ref 4.6–6.5)

## 2014-03-15 ENCOUNTER — Ambulatory Visit: Payer: Medicare Other | Admitting: Internal Medicine

## 2014-03-21 ENCOUNTER — Ambulatory Visit: Payer: Medicare Other | Admitting: Internal Medicine

## 2014-03-22 ENCOUNTER — Ambulatory Visit (INDEPENDENT_AMBULATORY_CARE_PROVIDER_SITE_OTHER): Payer: Medicare Other | Admitting: Internal Medicine

## 2014-03-22 ENCOUNTER — Encounter: Payer: Self-pay | Admitting: Internal Medicine

## 2014-03-22 VITALS — BP 102/74 | HR 72 | Temp 98.6°F | Ht 70.0 in | Wt 227.0 lb

## 2014-03-22 DIAGNOSIS — E785 Hyperlipidemia, unspecified: Secondary | ICD-10-CM | POA: Diagnosis not present

## 2014-03-22 DIAGNOSIS — N5231 Erectile dysfunction following radical prostatectomy: Secondary | ICD-10-CM

## 2014-03-22 DIAGNOSIS — K5790 Diverticulosis of intestine, part unspecified, without perforation or abscess without bleeding: Secondary | ICD-10-CM | POA: Insufficient documentation

## 2014-03-22 DIAGNOSIS — I2699 Other pulmonary embolism without acute cor pulmonale: Secondary | ICD-10-CM | POA: Diagnosis not present

## 2014-03-22 DIAGNOSIS — K5730 Diverticulosis of large intestine without perforation or abscess without bleeding: Secondary | ICD-10-CM

## 2014-03-22 DIAGNOSIS — C61 Malignant neoplasm of prostate: Secondary | ICD-10-CM | POA: Diagnosis not present

## 2014-03-22 DIAGNOSIS — R7309 Other abnormal glucose: Secondary | ICD-10-CM

## 2014-03-22 DIAGNOSIS — N529 Male erectile dysfunction, unspecified: Secondary | ICD-10-CM | POA: Insufficient documentation

## 2014-03-22 MED ORDER — PRAVASTATIN SODIUM 20 MG PO TABS: 20.0000 mg | ORAL_TABLET | Freq: Every day | ORAL | Status: DC

## 2014-03-22 NOTE — Progress Notes (Signed)
Subjective:    Patient ID: Nicholas Owen, male    DOB: 1946/05/18, 68 y.o.   MRN: [id number]  HPI  68 year old white male with history of prostate cancer, pulmonary embolism and diverticulosis for followup. Interval medical history-patient seen by Dr. Yong Channel on 02/13/2014 for her left lower quadrant/groin pain.  Patient successfully treated with course of Cipro and Flagyl for presumed diverticulitis. Patient reports his abdominal symptoms completely resolved. His bowel movements are fairly normal. He denies any melena or hematochezia.  Pulmonary embolism-patient clinic follow up with his oncologist within one week. He has been on Xarelto for approximately 2 years.  He reports oncologist is planning to transition him to aspirin therapy.  Hyperlipidemia-stable  Prostate cancer status post radical robotic prostatectomy-patient having ongoing issues with urinary incontinence. He goes to release 4 pads per day. Patient also experiencing erectile dysfunction. No response to PDE5 inhibitors in the past.   Review of Systems Mild weight loss, patient stays fairly active.    Past Medical History  Diagnosis Date  . Allergy   . Cancer     basal cell CA (skin)  . GERD (gastroesophageal reflux disease)   . BPH (benign prostatic hyperplasia)   . Hyperlipidemia   . Asbestos exposure   . Diverticulosis   . Colitis 01/2011  . Liver cyst 10/11    4 simple cysts in liver  . 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 Basal cell carcinoma     skin of left ear  . Kidney stones   . Lipoma 12-24-11    multiple(present- lipoma 2-lt./4- rt arms/ back/ chest  . Prostate cancer 12-24-11    dx. Prostate cancer after bx.  . Deviated nasal septum     hx. of  . DVT (deep venous thrombosis)   . Ulcerative colitis   . Colon polyp     History   Social History  . Marital Status: Married    Spouse Name: N/A    Number of Children: N/A  . Years of Education: N/A   Occupational  History  . Retired    Social History Main Topics  . Smoking status: Never Smoker   . Smokeless tobacco: Never Used     Comment: never used tobacco  . Alcohol Use: Yes     Comment: rare -1 every couple months  . Drug Use: No  . Sexual Activity: Yes   Other Topics Concern  . Not on file   Social History Narrative   Married, retired, no children         Physician roster   Delfin Edis - gastroenterology   Burney Gauze - hematology   Dr. Jocelyn Lamer - urology   Dr. Herbert Deaner - Ophthalmology    Past Surgical History  Procedure Laterality Date  . Colonoscopy    . Polypectomy    . Knee surgery  2007    right knee scope  . Tonsillectomy    . Lipoma removals  1990's    multiple and multiple remains now  . Robot assisted laparoscopic radical prostatectomy  12/31/2011    Procedure: ROBOTIC ASSISTED LAPAROSCOPIC RADICAL PROSTATECTOMY;  Surgeon: Bernestine Amass, MD;  Location: [facility];  Service: Urology;  Laterality: N/A;      . Lymph node dissection  12/31/2011    Procedure: LYMPH NODE DISSECTION;  Surgeon: Bernestine Amass, MD;  Location: [facility];  Service: Urology;  Laterality: Bilateral;  Bilateral  . Inguinal hernia repair  [phone]  double  . Cystoscopy  02/16/2012    Procedure: CYSTOSCOPY FLEXIBLE;  Surgeon: Bernestine Amass, MD;  Location: [facility];  Service: Urology;  Laterality: N/A;  FLEX CYSTO WITH REMOVAL OF STAPLES     Family History  Problem Relation Age of Onset  . Colon cancer Mother 67  . Colon cancer Father 65  . Esophageal cancer Neg Hx   . Stomach cancer Neg Hx   . Prostate cancer Paternal Grandfather     Allergies  Allergen Reactions  . Nsaids     colitis    Current Outpatient Prescriptions on File Prior to Visit  Medication Sig Dispense Refill  . albuterol (PROVENTIL HFA;VENTOLIN HFA) 108 (90 BASE) MCG/ACT inhaler Inhale 2 puffs into the lungs every 6 (six) hours as needed.  1 Inhaler  0  . aspirin 81 MG tablet Take 81 mg by mouth daily.        . metroNIDAZOLE (FLAGYL) 500 MG tablet Take 1 tablet (500 mg total) by mouth 3 (three) times daily.  30 tablet  0  . omeprazole (PRILOSEC) 20 MG capsule Take 1 capsule (20 mg total) by mouth daily.  90 capsule  1  . pravastatin (PRAVACHOL) 20 MG tablet Take 1 tablet (20 mg total) by mouth daily.  90 tablet  1  . XARELTO 20 MG TABS tablet TAKE ONE TABLET BY MOUTH ONCE DAILY  90 tablet  0   No current facility-administered medications on file prior to visit.    BP 102/74  Pulse 72  Temp(Src) 98.6 F (37 C) (Oral)  Ht 5\' 10"  (1.778 m)  Wt 227 lb (102.967 kg)  BMI 32.57 kg/m2    Objective:   Physical Exam  Constitutional: He is oriented to person, place, and time. He appears well-developed and well-nourished.  Cardiovascular: Normal rate, regular rhythm and normal heart sounds.   No murmur heard. Pulmonary/Chest: Effort normal and breath sounds normal. He has no wheezes.  Neurological: He is alert and oriented to person, place, and time. No cranial nerve deficit.  Psychiatric: He has a normal mood and affect. His behavior is normal.          Assessment & Plan:

## 2014-03-22 NOTE — Assessment & Plan Note (Signed)
Stable.  Continue same dose of pravastatin. Lab Results  Component Value Date   CHOL 164 09/05/2013   HDL 40.50 09/05/2013   LDLCALC 104* 09/05/2013   TRIG 97.0 09/05/2013   CHOLHDL 4 09/05/2013   Lab Results  Component Value Date   ALT 20 02/14/2014   AST 19 02/14/2014   ALKPHOS 66 02/14/2014   BILITOT 1.2 02/14/2014

## 2014-03-22 NOTE — Progress Notes (Signed)
Pre visit review using our clinic review tool, if applicable. No additional management support is needed unless otherwise documented below in the visit note. 

## 2014-03-22 NOTE — Assessment & Plan Note (Signed)
He with hx of prostate cancer, bilateral PE and positive lupus anticoagulant.  Followed by Dr. Marin Olp.  He has been on Xarelto for 2 yrs.  Plan is to transition to aspirin with close monitoring (D Dimer).  If recurrent, DVT or PE , he may require life long anticoagulation.

## 2014-03-22 NOTE — Assessment & Plan Note (Signed)
Controlled with diet and exercise.  Continue same.   Lab Results  Component Value Date   HGBA1C 5.8 03/07/2014

## 2014-03-22 NOTE — Assessment & Plan Note (Signed)
68 year old had recent episode of uncomplicated diverticulitis treated with Cipro and Flagyl. Patient encouraged to start using psyllium fiber laxative on a regular basis.    His last colonoscopy was performed by Dr. Olevia Perches on 01/07/2013. Colonoscopy showed a sessile polyp ranging between 3-5 mm in size in the cecum. Mild diverticulosis noted throughout the entire colon.

## 2014-03-22 NOTE — Patient Instructions (Addendum)
Use metamucil over the counter as directed Discuss sling procedure and MUSE with your urologist Follow up with your hematologist as directed

## 2014-03-22 NOTE — Assessment & Plan Note (Signed)
Poor response to PDE5 inhibitors in there past.  I suggest he discuss possible trial with MUSE.  He has also discussed considering penile prothesis with his urologist the past.  He does not want additional surgery.

## 2014-03-22 NOTE — Assessment & Plan Note (Signed)
Persistent issues with urinary incontinence after prostatectomy.  Follow up with Dr. Risa Grill.  Considering his age and current level of activity, patient to consider sling procedure.

## 2014-03-27 ENCOUNTER — Other Ambulatory Visit (HOSPITAL_BASED_OUTPATIENT_CLINIC_OR_DEPARTMENT_OTHER): Payer: Medicare Other | Admitting: Lab

## 2014-03-27 ENCOUNTER — Ambulatory Visit (HOSPITAL_BASED_OUTPATIENT_CLINIC_OR_DEPARTMENT_OTHER): Payer: Medicare Other | Admitting: Hematology & Oncology

## 2014-03-27 ENCOUNTER — Encounter: Payer: Self-pay | Admitting: Hematology & Oncology

## 2014-03-27 VITALS — BP 130/83 | HR 56 | Temp 98.1°F | Resp 18 | Ht 70.0 in | Wt 235.0 lb

## 2014-03-27 DIAGNOSIS — I2699 Other pulmonary embolism without acute cor pulmonale: Secondary | ICD-10-CM | POA: Diagnosis not present

## 2014-03-27 DIAGNOSIS — Z7982 Long term (current) use of aspirin: Secondary | ICD-10-CM | POA: Diagnosis not present

## 2014-03-27 DIAGNOSIS — Z86711 Personal history of pulmonary embolism: Secondary | ICD-10-CM | POA: Diagnosis not present

## 2014-03-27 DIAGNOSIS — D6862 Lupus anticoagulant syndrome: Secondary | ICD-10-CM

## 2014-03-27 LAB — CBC WITH DIFFERENTIAL (CANCER CENTER ONLY)
BASO#: 0 10*3/uL (ref 0.0–0.2)
BASO%: 0.5 % (ref 0.0–2.0)
EOS%: 8.2 % — AB (ref 0.0–7.0)
Eosinophils Absolute: 0.5 10*3/uL (ref 0.0–0.5)
HCT: 44.9 % (ref 38.7–49.9)
HGB: 15.9 g/dL (ref 13.0–17.1)
LYMPH#: 1.3 10*3/uL (ref 0.9–3.3)
LYMPH%: 21.8 % (ref 14.0–48.0)
MCH: 30.4 pg (ref 28.0–33.4)
MCHC: 35.4 g/dL (ref 32.0–35.9)
MCV: 86 fL (ref 82–98)
MONO#: 0.6 10*3/uL (ref 0.1–0.9)
MONO%: 10.6 % (ref 0.0–13.0)
NEUT#: 3.4 10*3/uL (ref 1.5–6.5)
NEUT%: 58.9 % (ref 40.0–80.0)
PLATELETS: 282 10*3/uL (ref 145–400)
RBC: 5.23 10*6/uL (ref 4.20–5.70)
RDW: 13.3 % (ref 11.1–15.7)
WBC: 5.7 10*3/uL (ref 4.0–10.0)

## 2014-03-28 LAB — D-DIMER, QUANTITATIVE: D-Dimer, Quant: 0.35 ug/mL-FEU (ref 0.00–0.48)

## 2014-03-28 NOTE — Progress Notes (Signed)
Hematology and Oncology Follow Up Visit  ELIC VENCILL [id number] 04/26/1946 68 y.o. 03/28/2014   Principle Diagnosis:  1. Xarelto 20 mg p.o. q. day -- to finish up therapy in November 2015. 2. Aspirin 81 mg p.o. q. day.  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     Interim History:  Mr.  Tooker is back for followup. He saw him 6 months ago. He's been doing fairly well. He still has issues with the urinary continence. He had a prostatectomy. So far, there's been no problems with recurrent prostate cancer.  He will finish up the Xarelto in November. There is been no problems with bleeding. He's had no shortness of breath. He's had no leg swelling. His appetite has been good. He's had no fever. He's had no headache.  He'll be heading down to [place] in November. We down there for a couple weeks.  We last saw him in April, his D- dimer was 0.27.   Medications: Current outpatient prescriptions:aspirin 81 MG tablet, Take 81 mg by mouth daily., Disp: , Rfl: ;  omeprazole (PRILOSEC) 20 MG capsule, Take 1 capsule (20 mg total) by mouth daily., Disp: 90 capsule, Rfl: 1;  pravastatin (PRAVACHOL) 20 MG tablet, Take 1 tablet (20 mg total) by mouth daily., Disp: 90 tablet, Rfl: 1;  XARELTO 20 MG TABS tablet, TAKE ONE TABLET BY MOUTH ONCE DAILY, Disp: 90 tablet, Rfl: 0  Allergies:  Allergies  Allergen Reactions  . Nsaids     colitis    Past Medical History, Surgical history, Social history, and Family History were reviewed and updated.  Review of Systems: As above  Physical Exam:  height is 5\' 10"  (1.778 m) and weight is 235 lb (106.595 kg). His oral temperature is 98.1 F (36.7 C). His blood pressure is 130/83 and his pulse is 56. His respiration is 18.   Lungs are clear. Cardiac exam regular rate and rhythm. Abdomen is soft. Mildly obese. No fluid wave. No palpable liver or spleen. Neck exam no palpable thyroid. No lymphadenopathy. Back exam no  tenderness over the spine ribs or hips. Extremities shows no clubbing cyanosis or edema. No venous cord is noted. Skin exam no rashes. Neurological exam no focal deficits.  Lab Results  Component Value Date   WBC 5.7 03/27/2014   HGB 15.9 03/27/2014   HCT 44.9 03/27/2014   MCV 86 03/27/2014   PLT 282 03/27/2014     Chemistry      Component Value Date/Time   NA 136 03/07/2014 0838   K 3.9 03/07/2014 0838   CL 105 03/07/2014 0838   CO2 23 03/07/2014 0838   BUN 17 03/07/2014 0838   CREATININE 0.8 03/07/2014 0838      Component Value Date/Time   CALCIUM 8.7 03/07/2014 0838   ALKPHOS 66 02/14/2014 1057   AST 19 02/14/2014 1057   ALT 20 02/14/2014 1057   BILITOT 1.2 02/14/2014 1057     D-dimer 0.35   Impression and Plan: Mr. Schoenberger is 68 year old gentleman with history of pulmonary embolism. He has a positive lupus anticoagulant that has been verified. He is on both Xarelto and aspirin. He's doing well. He's had no bleeding. He's had no thrombotic complications.  Again, he will stop the Xarelto in November. He will then go on to full dose aspirin. I want him on full dose aspirin because of the positive lupus anticoagulant.  I told him to take the aspirin with food. I told him to take  coated aspirin.  I want to see back in 3 months. If all looks good in 3 months, then we can probably let him go from the clinic unless he has a problem down the road.      Volanda Napoleon, MD 10/27/20157:29 AM

## 2014-05-04 DIAGNOSIS — C61 Malignant neoplasm of prostate: Secondary | ICD-10-CM | POA: Diagnosis not present

## 2014-05-11 DIAGNOSIS — Z8546 Personal history of malignant neoplasm of prostate: Secondary | ICD-10-CM | POA: Diagnosis not present

## 2014-05-11 DIAGNOSIS — N393 Stress incontinence (female) (male): Secondary | ICD-10-CM | POA: Diagnosis not present

## 2014-06-16 ENCOUNTER — Ambulatory Visit (HOSPITAL_BASED_OUTPATIENT_CLINIC_OR_DEPARTMENT_OTHER): Payer: Medicare Other | Admitting: Lab

## 2014-06-16 ENCOUNTER — Encounter: Payer: Self-pay | Admitting: Hematology & Oncology

## 2014-06-16 ENCOUNTER — Ambulatory Visit (HOSPITAL_BASED_OUTPATIENT_CLINIC_OR_DEPARTMENT_OTHER): Payer: Medicare Other | Admitting: Hematology & Oncology

## 2014-06-16 DIAGNOSIS — I2699 Other pulmonary embolism without acute cor pulmonale: Secondary | ICD-10-CM

## 2014-06-16 DIAGNOSIS — D6862 Lupus anticoagulant syndrome: Secondary | ICD-10-CM

## 2014-06-16 LAB — CBC WITH DIFFERENTIAL (CANCER CENTER ONLY)
BASO#: 0 10*3/uL (ref 0.0–0.2)
BASO%: 0.4 % (ref 0.0–2.0)
EOS%: 12.1 % — AB (ref 0.0–7.0)
Eosinophils Absolute: 0.7 10*3/uL — ABNORMAL HIGH (ref 0.0–0.5)
HEMATOCRIT: 44.9 % (ref 38.7–49.9)
HEMOGLOBIN: 15.7 g/dL (ref 13.0–17.1)
LYMPH#: 1.4 10*3/uL (ref 0.9–3.3)
LYMPH%: 25.3 % (ref 14.0–48.0)
MCH: 29.6 pg (ref 28.0–33.4)
MCHC: 35 g/dL (ref 32.0–35.9)
MCV: 85 fL (ref 82–98)
MONO#: 0.5 10*3/uL (ref 0.1–0.9)
MONO%: 8.8 % (ref 0.0–13.0)
NEUT#: 3 10*3/uL (ref 1.5–6.5)
NEUT%: 53.4 % (ref 40.0–80.0)
Platelets: 282 10*3/uL (ref 145–400)
RBC: 5.31 10*6/uL (ref 4.20–5.70)
RDW: 12.8 % (ref 11.1–15.7)
WBC: 5.5 10*3/uL (ref 4.0–10.0)

## 2014-06-16 LAB — COMPREHENSIVE METABOLIC PANEL (CC13)
ALK PHOS: 74 U/L (ref 40–150)
ALT: 22 U/L (ref 0–55)
AST: 19 U/L (ref 5–34)
Albumin: 3.9 g/dL (ref 3.5–5.0)
Anion Gap: 8 mEq/L (ref 3–11)
BUN: 17.2 mg/dL (ref 7.0–26.0)
CO2: 24 mEq/L (ref 22–29)
CREATININE: 0.9 mg/dL (ref 0.7–1.3)
Calcium: 8.9 mg/dL (ref 8.4–10.4)
Chloride: 107 mEq/L (ref 98–109)
EGFR: 84 mL/min/{1.73_m2} — ABNORMAL LOW (ref >90)
Glucose: 111 mg/dl (ref 70–140)
Potassium: 4.4 mEq/L (ref 3.5–5.1)
Sodium: 139 mEq/L (ref 136–145)
Total Bilirubin: 0.92 mg/dL (ref 0.20–1.20)
Total Protein: 6.5 g/dL (ref 6.4–8.3)

## 2014-06-16 NOTE — Progress Notes (Signed)
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  INTERVAL HISTORY: Mr.Szczygiel is here today for a follow-up. He is feeling much better. He stopped taking his Xarelto in November and is now on 1 full dose aspirin daily. He has had no bleeding.   His last CT angio in June showed no evidence of PE.  He denies fever, chills, n/v, cough, rash, headache, dizziness, SOB, chest pain, palpitations, abdominal pain, constipation, diarrhea, blood in urine or stool. No new aches or pains. His right knee bother him at times. This is not a new issue.  No swelling, numbness or tingling in his extremities.  He has issues with urinary incontinence. He had a prostatectomy. So far, there has been no evidence of recurrent prostate cancer. His appetite is good and he is drinking lots of fluids.  In October, his D- dimer was 0.35.   CURRENT TREATMENT: Aspirin 325 mg daily  REVIEW OF SYSTEMS: All other 10 point review of systems is negative.   PAST MEDICAL HISTORY: Past Medical History  Diagnosis Date  . Allergy   . Cancer     basal cell CA (skin)  . GERD (gastroesophageal reflux disease)   . BPH (benign prostatic hyperplasia)   . Hyperlipidemia   . Asbestos exposure   . Diverticulosis   . Colitis 01/2011  . Liver cyst 10/11    4 simple cysts in liver  . 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 Basal cell carcinoma     skin of left ear  . Kidney stones   . Lipoma 12-24-11    multiple(present- lipoma 2-lt./4- rt arms/ back/ chest  . Prostate cancer 12-24-11    dx. Prostate cancer after bx.  . Deviated nasal septum     hx. of  . DVT (deep venous thrombosis)   . Ulcerative colitis   . Colon polyp     PAST SURGICAL HISTORY: Past Surgical History   Procedure Laterality Date  . Colonoscopy    . Polypectomy    . Knee surgery  2007    right knee scope  . Tonsillectomy    . Lipoma removals  1990's    multiple and multiple remains now  . Robot assisted laparoscopic radical prostatectomy  12/31/2011    Procedure: ROBOTIC ASSISTED LAPAROSCOPIC RADICAL PROSTATECTOMY;  Surgeon: Bernestine Amass, MD;  Location: [facility];  Service: Urology;  Laterality: N/A;      . Lymph node dissection  12/31/2011    Procedure: LYMPH NODE DISSECTION;  Surgeon: Bernestine Amass, MD;  Location: [facility];  Service: Urology;  Laterality: Bilateral;  Bilateral  . Inguinal hernia repair  1990's    double  . Cystoscopy  02/16/2012    Procedure: CYSTOSCOPY FLEXIBLE;  Surgeon: Bernestine Amass, MD;  Location: [facility];  Service: Urology;  Laterality: N/A;  FLEX CYSTO WITH REMOVAL OF STAPLES     FAMILY HISTORY Family History  Problem Relation Age of Onset  . Colon cancer Mother 51  . Colon cancer Father 59  . Esophageal cancer Neg Hx   . Stomach cancer Neg Hx   . Prostate cancer Paternal Grandfather     GYNECOLOGIC HISTORY:  No LMP for male patient.   SOCIAL HISTORY: History   Social History  .  Marital Status: Married    Spouse Name: N/A    Number of Children: N/A  . Years of Education: N/A   Occupational History  . Retired    Social History Main Topics  . Smoking status: Never Smoker   . Smokeless tobacco: Never Used     Comment: never used tobacco  . Alcohol Use: Yes     Comment: rare -1 every couple months  . Drug Use: No  . Sexual Activity: Yes   Other Topics Concern  . Not on file   Social History Narrative   Married, retired, no children         Physician roster   Delfin Edis - gastroenterology   Burney Gauze - hematology   Dr. Jocelyn Lamer - urology   Dr. Herbert Deaner - Ophthalmology    ADVANCED DIRECTIVES:  <no information>  HEALTH MAINTENANCE: History  Substance Use Topics  . Smoking status: Never Smoker   . Smokeless  tobacco: Never Used     Comment: never used tobacco  . Alcohol Use: Yes     Comment: rare -1 every couple months   Colonoscopy: PAP: Bone density: Lipid panel:  Allergies  Allergen Reactions  . Nsaids     colitis    Current Outpatient Prescriptions  Medication Sig Dispense Refill  . aspirin 325 MG tablet Take 325 mg by mouth daily.    Marland Kitchen omeprazole (PRILOSEC) 20 MG capsule Take 1 capsule (20 mg total) by mouth daily. (Patient taking differently: Take 20 mg by mouth every other day. ) 90 capsule 1  . polyethylene glycol (MIRALAX / GLYCOLAX) packet Take 17 g by mouth daily.    . pravastatin (PRAVACHOL) 20 MG tablet Take 1 tablet (20 mg total) by mouth daily. 90 tablet 1   No current facility-administered medications for this visit.    OBJECTIVE: Filed Vitals:   06/16/14 1215  BP: 116/73  Pulse: 72  Temp: 98.4 F (36.9 C)  Resp: 20    Filed Weights   06/16/14 1215  Weight: 246 lb (111.585 kg)   ECOG FS:0 - Asymptomatic Ocular: Sclerae unicteric, pupils equal, round and reactive to light Ear-nose-throat: Oropharynx clear, dentition fair Lymphatic: No cervical or supraclavicular adenopathy Lungs no rales or rhonchi, good excursion bilaterally Heart regular rate and rhythm, no murmur appreciated Abd soft, nontender, positive bowel sounds MSK no focal spinal tenderness, no joint edema Neuro: non-focal, well-oriented, appropriate affect  LAB RESULTS: CMP     Component Value Date/Time   NA 136 03/07/2014 0838   K 3.9 03/07/2014 0838   CL 105 03/07/2014 0838   CO2 23 03/07/2014 0838   GLUCOSE 92 03/07/2014 0838   BUN 17 03/07/2014 0838   CREATININE 0.8 03/07/2014 0838   CALCIUM 8.7 03/07/2014 0838   PROT 6.8 02/14/2014 1057   ALBUMIN 3.7 02/14/2014 1057   AST 19 02/14/2014 1057   ALT 20 02/14/2014 1057   ALKPHOS 66 02/14/2014 1057   BILITOT 1.2 02/14/2014 1057   GFRNONAA >90 04/15/2012 0628   GFRAA >90 04/15/2012 0628   INo results found for: SPEP, UPEP Lab  Results  Component Value Date   WBC 5.5 06/16/2014   NEUTROABS 3.0 06/16/2014   HGB 15.7 06/16/2014   HCT 44.9 06/16/2014   MCV 85 06/16/2014   PLT 282 06/16/2014   No results found for: LABCA2 No components found for: VVOHY073 No results for input(s): INR in the last 168 hours.  STUDIES: No results found.  ASSESSMENT/PLAN: Mr. Huckins is 69 year old gentleman with  history of pulmonary embolism secondary to a lupus anticoagulant. He has completed Xarelto and is now on Aspirin 325 mg daily. He's had no bleeding and no thrombotic complications. He will continue taking his aspirin daily.  We will see what his lupus anticoagulant panel shows.  We will see him back in 4 months for labs and follow-up.  He knows to call here with any questions or concerns and to go to the ED in the event of an emergency. We can certainly see him sooner if need be.   Eliezer Bottom, NP 06/16/2014 1:12 PM

## 2014-06-19 LAB — LUPUS ANTICOAGULANT PANEL
DRVVT 1 1 MIX: 52.5 s — AB (ref <42.9)
DRVVT CONFIRMATION: 2.33 ratio — AB (ref <1.15)
DRVVT: 77.1 secs — ABNORMAL HIGH (ref <42.9)
Lupus Anticoagulant: DETECTED — AB
PTT LA: 60.9 s — AB (ref 28.0–43.0)
PTTLA 4:1 Mix: 60.1 secs — ABNORMAL HIGH (ref 28.0–43.0)
PTTLA Confirmation: 18.6 secs — ABNORMAL HIGH (ref <8.0)

## 2014-06-23 LAB — HEXAGONAL PHOSPHOLIPID NEUTRALIZATION: Hex Phosph Neut Test: POSITIVE — AB

## 2014-09-20 ENCOUNTER — Encounter: Payer: Self-pay | Admitting: Internal Medicine

## 2014-09-20 ENCOUNTER — Ambulatory Visit (INDEPENDENT_AMBULATORY_CARE_PROVIDER_SITE_OTHER): Payer: Medicare Other | Admitting: Internal Medicine

## 2014-09-20 VITALS — BP 118/80 | HR 78 | Temp 97.4°F | Wt 238.8 lb

## 2014-09-20 DIAGNOSIS — Z86718 Personal history of other venous thrombosis and embolism: Secondary | ICD-10-CM | POA: Diagnosis not present

## 2014-09-20 DIAGNOSIS — R32 Unspecified urinary incontinence: Secondary | ICD-10-CM | POA: Diagnosis not present

## 2014-09-20 DIAGNOSIS — R7309 Other abnormal glucose: Secondary | ICD-10-CM | POA: Diagnosis not present

## 2014-09-20 DIAGNOSIS — E785 Hyperlipidemia, unspecified: Secondary | ICD-10-CM | POA: Diagnosis not present

## 2014-09-20 DIAGNOSIS — K219 Gastro-esophageal reflux disease without esophagitis: Secondary | ICD-10-CM

## 2014-09-20 LAB — HEPATIC FUNCTION PANEL
ALT: 21 U/L (ref 0–53)
AST: 21 U/L (ref 0–37)
Albumin: 4.4 g/dL (ref 3.5–5.2)
Alkaline Phosphatase: 73 U/L (ref 39–117)
BILIRUBIN DIRECT: 0.3 mg/dL (ref 0.0–0.3)
BILIRUBIN TOTAL: 1.5 mg/dL — AB (ref 0.2–1.2)
Total Protein: 6.8 g/dL (ref 6.0–8.3)

## 2014-09-20 LAB — LIPID PANEL
CHOL/HDL RATIO: 4
Cholesterol: 151 mg/dL (ref 0–200)
HDL: 36 mg/dL — AB (ref >39.00)
LDL Cholesterol: 95 mg/dL (ref 0–99)
NonHDL: 115
Triglycerides: 98 mg/dL (ref 0.0–149.0)
VLDL: 19.6 mg/dL (ref 0.0–40.0)

## 2014-09-20 LAB — HEMOGLOBIN A1C: Hgb A1c MFr Bld: 6 % (ref 4.6–6.5)

## 2014-09-20 MED ORDER — PRAVASTATIN SODIUM 20 MG PO TABS: 20.0000 mg | ORAL_TABLET | Freq: Every day | ORAL | Status: DC

## 2014-09-20 MED ORDER — OMEPRAZOLE 20 MG PO CPDR: 20.0000 mg | DELAYED_RELEASE_CAPSULE | Freq: Every day | ORAL | Status: DC

## 2014-09-20 NOTE — Patient Instructions (Signed)
Use over the counter metamucile once daily Try to transition from omeprazole to over the counter ranitidine (zantac) 150 mg twice daily as needed Please schedule next office visit as medicare wellness exam

## 2014-09-20 NOTE — Assessment & Plan Note (Signed)
Risks of long-term PPI use discussed with patient. Try to transition to over-the-counter ranitidine 150 mg twice daily as needed.

## 2014-09-20 NOTE — Assessment & Plan Note (Signed)
Monitor A1c 

## 2014-09-20 NOTE — Assessment & Plan Note (Signed)
Patient having ongoing issues with urinary incontinence after radical prostatectomy. He does not want to consider surgical intervention for now. Continue to use pads.

## 2014-09-20 NOTE — Progress Notes (Signed)
Pre visit review using our clinic review tool, if applicable. No additional management support is needed unless otherwise documented below in the visit note. 

## 2014-09-20 NOTE — Assessment & Plan Note (Signed)
He is tolerating pravastatin.  Monitor lipid panel and LFTs.

## 2014-09-20 NOTE — Assessment & Plan Note (Signed)
Followed by hematology. Patient was anticoagulated for 2 years. He is now on aspirin 325 mg once daily. No clinical evidence of DVT or PE recurrence. Check d-dimer.

## 2014-09-20 NOTE — Progress Notes (Signed)
Subjective:    Patient ID: Nicholas Owen, male    DOB: 02-03-1946, 69 y.o.   MRN: [id number]  HPI  69 year old white male with history of prostate cancer, DVT and pulmonary embolism, hyperlipidemia routine follow-up. Interval medical history-patient seen by hematologist. His Xarelto was discontinued in November 2015. Patient was anticoagulated for 2 years. Patient has history of DVT, bilateral PE and is positive for lupus anticoagulant.  Patient has been tolerating aspirin 325 mg once daily. He usually takes medication after full meal. He has history of chronic GERD and takes omeprazole 20 mg every other day.  He denies any chest pain, shortness of breath or lower extremity swelling.  History of prostate cancer status post radical prostatectomy-he has ongoing issues with urinary incontinence. He is followed by urologist. He has declined sling procedure to address chronic urinary incontinence.  Hyperlipidemia-he is tolerating pravastatin 20 mg once daily.  He is due for follow-up blood work.  Review of Systems Negative for chest pain or shortness of breath, no lower extremity swelling    Past Medical History  Diagnosis Date  . Allergy   . Cancer     basal cell CA (skin)  . GERD (gastroesophageal reflux disease)   . BPH (benign prostatic hyperplasia)   . Hyperlipidemia   . Asbestos exposure   . Diverticulosis   . Colitis 01/2011  . Liver cyst 10/11    4 simple cysts in liver  . 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 Basal cell carcinoma     skin of left ear  . Kidney stones   . Lipoma 12-24-11    multiple(present- lipoma 2-lt./4- rt arms/ back/ chest  . Prostate cancer 12-24-11    dx. Prostate cancer after bx.  . Deviated nasal septum     hx. of  . DVT (deep venous thrombosis)   . Ulcerative colitis   . Colon polyp     History   Social History  . Marital Status: Married    Spouse Name: N/A  . Number of Children: N/A  . Years of Education:  N/A   Occupational History  . Retired    Social History Main Topics  . Smoking status: Never Smoker   . Smokeless tobacco: Never Used     Comment: never used tobacco  . Alcohol Use: Yes     Comment: rare -1 every couple months  . Drug Use: No  . Sexual Activity: Yes   Other Topics Concern  . Not on file   Social History Narrative   Married, retired, no children         Physician roster   Delfin Edis - gastroenterology   Burney Gauze - hematology   Dr. Jocelyn Lamer - urology   Dr. Herbert Deaner - Ophthalmology    Past Surgical History  Procedure Laterality Date  . Colonoscopy    . Polypectomy    . Knee surgery  2007    right knee scope  . Tonsillectomy    . Lipoma removals  1990's    multiple and multiple remains now  . Robot assisted laparoscopic radical prostatectomy  12/31/2011    Procedure: ROBOTIC ASSISTED LAPAROSCOPIC RADICAL PROSTATECTOMY;  Surgeon: Bernestine Amass, MD;  Location: [facility];  Service: Urology;  Laterality: N/A;      . Lymph node dissection  12/31/2011    Procedure: LYMPH NODE DISSECTION;  Surgeon: Bernestine Amass, MD;  Location: [facility];  Service: Urology;  Laterality:  Bilateral;  Bilateral  . Inguinal hernia repair  1990's    double  . Cystoscopy  02/16/2012    Procedure: CYSTOSCOPY FLEXIBLE;  Surgeon: Bernestine Amass, MD;  Location: [facility];  Service: Urology;  Laterality: N/A;  FLEX CYSTO WITH REMOVAL OF STAPLES     Family History  Problem Relation Age of Onset  . Colon cancer Mother 57  . Colon cancer Father 12  . Esophageal cancer Neg Hx   . Stomach cancer Neg Hx   . Prostate cancer Paternal Grandfather     Allergies  Allergen Reactions  . Nsaids     colitis    Current Outpatient Prescriptions on File Prior to Visit  Medication Sig Dispense Refill  . aspirin 325 MG tablet Take 325 mg by mouth daily.    Marland Kitchen omeprazole (PRILOSEC) 20 MG capsule Take 1 capsule (20 mg total) by mouth daily. (Patient taking differently: Take 20 mg by  mouth every other day. ) 90 capsule 1  . polyethylene glycol (MIRALAX / GLYCOLAX) packet Take 17 g by mouth daily.    . pravastatin (PRAVACHOL) 20 MG tablet Take 1 tablet (20 mg total) by mouth daily. 90 tablet 1   No current facility-administered medications on file prior to visit.    BP 118/80 mmHg  Pulse 78  Temp(Src) 97.4 F (36.3 C) (Oral)  Wt 238 lb 12.8 oz (108.319 kg)     Objective:   Physical Exam  Constitutional: He is oriented to person, place, and time. He appears well-developed and well-nourished. No distress.  HENT:  Head: Normocephalic and atraumatic.  Neck: Neck supple.  No carotid bruit  Cardiovascular: Normal rate, regular rhythm and normal heart sounds.   Pulmonary/Chest: Effort normal and breath sounds normal. He has no wheezes.  Musculoskeletal: He exhibits no edema.  no calf tenderness.  No palpable cords  Neurological: He is alert and oriented to person, place, and time. No cranial nerve deficit.  Skin: Skin is warm and dry.  Psychiatric: He has a normal mood and affect. His behavior is normal.          Assessment & Plan:

## 2014-09-21 LAB — D-DIMER, QUANTITATIVE: D-Dimer, Quant: 0.51 ug/mL-FEU — ABNORMAL HIGH (ref 0.00–0.48)

## 2014-10-14 ENCOUNTER — Emergency Department (HOSPITAL_BASED_OUTPATIENT_CLINIC_OR_DEPARTMENT_OTHER): Payer: Medicare Other

## 2014-10-14 ENCOUNTER — Emergency Department (HOSPITAL_BASED_OUTPATIENT_CLINIC_OR_DEPARTMENT_OTHER)
Admission: EM | Admit: 2014-10-14 | Discharge: 2014-10-14 | Disposition: A | Discharge status: Home / Self Care | Payer: Medicare Other | Attending: Emergency Medicine | Admitting: Emergency Medicine

## 2014-10-14 ENCOUNTER — Encounter (HOSPITAL_BASED_OUTPATIENT_CLINIC_OR_DEPARTMENT_OTHER): Payer: Self-pay

## 2014-10-14 ENCOUNTER — Encounter: Payer: Self-pay | Admitting: Internal Medicine

## 2014-10-14 DIAGNOSIS — E785 Hyperlipidemia, unspecified: Secondary | ICD-10-CM | POA: Diagnosis not present

## 2014-10-14 DIAGNOSIS — Z86010 Personal history of colonic polyps: Secondary | ICD-10-CM | POA: Insufficient documentation

## 2014-10-14 DIAGNOSIS — N23 Unspecified renal colic: Secondary | ICD-10-CM | POA: Insufficient documentation

## 2014-10-14 DIAGNOSIS — Z79899 Other long term (current) drug therapy: Secondary | ICD-10-CM | POA: Insufficient documentation

## 2014-10-14 DIAGNOSIS — Z7982 Long term (current) use of aspirin: Secondary | ICD-10-CM | POA: Diagnosis not present

## 2014-10-14 DIAGNOSIS — N390 Urinary tract infection, site not specified: Secondary | ICD-10-CM | POA: Diagnosis not present

## 2014-10-14 DIAGNOSIS — Z8546 Personal history of malignant neoplasm of prostate: Secondary | ICD-10-CM | POA: Insufficient documentation

## 2014-10-14 DIAGNOSIS — R109 Unspecified abdominal pain: Secondary | ICD-10-CM

## 2014-10-14 DIAGNOSIS — R1031 Right lower quadrant pain: Secondary | ICD-10-CM | POA: Diagnosis present

## 2014-10-14 DIAGNOSIS — K219 Gastro-esophageal reflux disease without esophagitis: Secondary | ICD-10-CM | POA: Insufficient documentation

## 2014-10-14 DIAGNOSIS — Z86718 Personal history of other venous thrombosis and embolism: Secondary | ICD-10-CM | POA: Diagnosis not present

## 2014-10-14 DIAGNOSIS — Z85828 Personal history of other malignant neoplasm of skin: Secondary | ICD-10-CM | POA: Diagnosis not present

## 2014-10-14 LAB — CBC WITH DIFFERENTIAL/PLATELET
Basophils Absolute: 0 10*3/uL (ref 0.0–0.1)
Basophils Relative: 0 % (ref 0–1)
Eosinophils Absolute: 0.4 10*3/uL (ref 0.0–0.7)
Eosinophils Relative: 5 % (ref 0–5)
HCT: 44 % (ref 39.0–52.0)
HEMOGLOBIN: 15.3 g/dL (ref 13.0–17.0)
Lymphocytes Relative: 13 % (ref 12–46)
Lymphs Abs: 0.9 10*3/uL (ref 0.7–4.0)
MCH: 29.9 pg (ref 26.0–34.0)
MCHC: 34.8 g/dL (ref 30.0–36.0)
MCV: 86.1 fL (ref 78.0–100.0)
MONOS PCT: 7 % (ref 3–12)
Monocytes Absolute: 0.5 10*3/uL (ref 0.1–1.0)
NEUTROS PCT: 75 % (ref 43–77)
Neutro Abs: 5.5 10*3/uL (ref 1.7–7.7)
PLATELETS: 281 10*3/uL (ref 150–400)
RBC: 5.11 MIL/uL (ref 4.22–5.81)
RDW: 13.6 % (ref 11.5–15.5)
WBC: 7.4 10*3/uL (ref 4.0–10.5)

## 2014-10-14 LAB — URINALYSIS, ROUTINE W REFLEX MICROSCOPIC
BILIRUBIN URINE: NEGATIVE
GLUCOSE, UA: NEGATIVE mg/dL
KETONES UR: NEGATIVE mg/dL
Leukocytes, UA: NEGATIVE
Nitrite: NEGATIVE
PH: 5 (ref 5.0–8.0)
Protein, ur: NEGATIVE mg/dL
SPECIFIC GRAVITY, URINE: 1.025 (ref 1.005–1.030)
Urobilinogen, UA: 0.2 mg/dL (ref 0.0–1.0)

## 2014-10-14 LAB — COMPREHENSIVE METABOLIC PANEL
ALK PHOS: 64 U/L (ref 38–126)
ALT: 22 U/L (ref 17–63)
AST: 23 U/L (ref 15–41)
Albumin: 4.2 g/dL (ref 3.5–5.0)
Anion gap: 10 (ref 5–15)
BILIRUBIN TOTAL: 1 mg/dL (ref 0.3–1.2)
BUN: 20 mg/dL (ref 6–20)
CALCIUM: 9.1 mg/dL (ref 8.9–10.3)
CO2: 24 mmol/L (ref 22–32)
Chloride: 106 mmol/L (ref 101–111)
Creatinine, Ser: 1.17 mg/dL (ref 0.61–1.24)
GFR calc Af Amer: >60 mL/min (ref >60)
Glucose, Bld: 130 mg/dL — ABNORMAL HIGH (ref 65–99)
Potassium: 4.4 mmol/L (ref 3.5–5.1)
Sodium: 140 mmol/L (ref 135–145)
Total Protein: 6.8 g/dL (ref 6.5–8.1)

## 2014-10-14 LAB — URINE MICROSCOPIC-ADD ON

## 2014-10-14 MED ORDER — KETOROLAC TROMETHAMINE 30 MG/ML IJ SOLN
30.0000 mg | Freq: Once | INTRAMUSCULAR | Status: AC
  Administered 2014-10-14: 30 mg via INTRAVENOUS
  Filled 2014-10-14: qty 1

## 2014-10-14 MED ORDER — ONDANSETRON HCL 4 MG/2ML IJ SOLN
4.0000 mg | Freq: Once | INTRAMUSCULAR | Status: AC
  Administered 2014-10-14: 4 mg via INTRAVENOUS
  Filled 2014-10-14: qty 2

## 2014-10-14 MED ORDER — MORPHINE SULFATE 4 MG/ML IJ SOLN
4.0000 mg | Freq: Once | INTRAMUSCULAR | Status: AC
  Administered 2014-10-14: 4 mg via INTRAVENOUS
  Filled 2014-10-14: qty 1

## 2014-10-14 MED ORDER — CIPROFLOXACIN HCL 500 MG PO TABS
500.0000 mg | ORAL_TABLET | Freq: Once | ORAL | Status: AC
  Administered 2014-10-14: 500 mg via ORAL
  Filled 2014-10-14: qty 1

## 2014-10-14 MED ORDER — OXYCODONE-ACETAMINOPHEN 5-325 MG PO TABS: 1.0000 | ORAL_TABLET | ORAL | Status: DC | PRN

## 2014-10-14 MED ORDER — IBUPROFEN 800 MG PO TABS: 800.0000 mg | ORAL_TABLET | Freq: Three times a day (TID) | ORAL | Status: DC | PRN

## 2014-10-14 MED ORDER — SODIUM CHLORIDE 0.9 % IV BOLUS (SEPSIS)
1000.0000 mL | Freq: Once | INTRAVENOUS | Status: AC
  Administered 2014-10-14: 1000 mL via INTRAVENOUS

## 2014-10-14 MED ORDER — CIPROFLOXACIN HCL 500 MG PO TABS
500.0000 mg | ORAL_TABLET | Freq: Two times a day (BID) | ORAL | Status: DC
Start: 2014-10-14 — End: 2015-04-30

## 2014-10-14 MED ORDER — IOHEXOL 300 MG/ML  SOLN
100.0000 mL | Freq: Once | INTRAMUSCULAR | Status: AC | PRN
Start: 2014-10-14 — End: 2014-10-14
  Administered 2014-10-14: 100 mL via INTRAVENOUS

## 2014-10-14 NOTE — ED Provider Notes (Signed)
CSN: [id number]     Arrival date & time 10/14/14  1447 History  This chart was scribed for Wandra Arthurs, MD by Chester Holstein, ED Scribe. This patient was seen in room MH06/MH06 and the patient's care was started at 4:09 PM.    Chief Complaint  Patient presents with  . Abdominal Pain     The history is provided by the patient. No language interpreter was used.   HPI Comments: Nicholas Owen is a 69 y.o. male with PMHx of diverticulosis, colitis, renal calculi, liver cyst, HLD, prostate CA in remission, DVT who presents to the Emergency Department complaining of gradually worsening RLQ abdominal pain with onset this morning. He notes associated dysuria. Pt states he vomited twice due to pain after arrival to ED. Pt reports pain is similar to h/o of diverticulitis. Pt with h/o prostatectomy in 2013. He has NKDA. Pt denies fever, nausea, constipation, and diarrhea.   Past Medical History  Diagnosis Date  . Allergy   . Cancer     basal cell CA (skin)  . GERD (gastroesophageal reflux disease)   . BPH (benign prostatic hyperplasia)   . Hyperlipidemia   . Asbestos exposure   . Diverticulosis   . Colitis 01/2011  . Liver cyst 10/11    4 simple cysts in liver  . 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 Basal cell carcinoma     skin of left ear  . Kidney stones   . Lipoma 12-24-11    multiple(present- lipoma 2-lt./4- rt arms/ back/ chest  . Prostate cancer 12-24-11    dx. Prostate cancer after bx.  . Deviated nasal septum     hx. of  . DVT (deep venous thrombosis)   . Ulcerative colitis   . Colon polyp    Past Surgical History  Procedure Laterality Date  . Colonoscopy    . Polypectomy    . Knee surgery  2007    right knee scope  . Tonsillectomy    . Lipoma removals  1990's    multiple and multiple remains now  . Robot assisted laparoscopic radical prostatectomy  12/31/2011    Procedure: ROBOTIC ASSISTED LAPAROSCOPIC RADICAL PROSTATECTOMY;  Surgeon: Bernestine Amass, MD;  Location: [facility];  Service: Urology;  Laterality: N/A;      . Lymph node dissection  12/31/2011    Procedure: LYMPH NODE DISSECTION;  Surgeon: Bernestine Amass, MD;  Location: [facility];  Service: Urology;  Laterality: Bilateral;  Bilateral  . Inguinal hernia repair  1990's    double  . Cystoscopy  02/16/2012    Procedure: CYSTOSCOPY FLEXIBLE;  Surgeon: Bernestine Amass, MD;  Location: [facility];  Service: Urology;  Laterality: N/A;  FLEX CYSTO WITH REMOVAL OF STAPLES    Family History  Problem Relation Age of Onset  . Colon cancer Mother 78  . Colon cancer Father 68  . Esophageal cancer Neg Hx   . Stomach cancer Neg Hx   . Prostate cancer Paternal Grandfather    History  Substance Use Topics  . Smoking status: Never Smoker   . Smokeless tobacco: Never Used     Comment: never used tobacco  . Alcohol Use: Yes     Comment: rare -1 every couple months    Review of Systems  Constitutional: Negative for fever and chills.  Gastrointestinal: Positive for vomiting and abdominal pain. Negative for nausea, diarrhea and constipation.  Genitourinary: Positive for  dysuria.  All other systems reviewed and are negative.     Allergies  Nsaids  Home Medications   Prior to Admission medications   Medication Sig Start Date End Date Taking? Authorizing Provider  beta carotene w/minerals (OCUVITE) tablet Take 1 tablet by mouth daily.   Yes Historical Provider, MD  aspirin 325 MG tablet Take 325 mg by mouth daily.    Historical Provider, MD  omeprazole (PRILOSEC) 20 MG capsule Take 1 capsule (20 mg total) by mouth daily. 09/20/14   Doe-Hyun R Shawna Orleans, DO  pravastatin (PRAVACHOL) 20 MG tablet Take 1 tablet (20 mg total) by mouth daily. 09/20/14   Doe-Hyun R Shawna Orleans, DO   BP 144/77 mmHg  Pulse 59  Temp(Src) 97.5 F (36.4 C) (Oral)  Resp 16  Wt 240 lb (108.863 kg)  SpO2 96% Physical Exam  Constitutional: He is oriented to person, place, and time. He appears well-developed  and well-nourished.  HENT:  Head: Normocephalic and atraumatic.  Mouth/Throat: Mucous membranes are dry.  Eyes: EOM are normal.  Neck: Normal range of motion.  Cardiovascular: Normal rate, regular rhythm, normal heart sounds and intact distal pulses.   No murmur heard. Pulmonary/Chest: Effort normal and breath sounds normal. No respiratory distress. He has no wheezes. He has no rales.  Abdominal: Soft. He exhibits no distension. There is tenderness in the right lower quadrant. There is no rebound and no CVA tenderness.  Musculoskeletal: Normal range of motion.  Neurological: He is alert and oriented to person, place, and time.  Skin: Skin is warm and dry.  Psychiatric: He has a normal mood and affect. Judgment normal.  Nursing note and vitals reviewed.   ED Course  Procedures (including critical care time) DIAGNOSTIC STUDIES: Oxygen Saturation is 97% on room air, normal by my interpretation.    COORDINATION OF CARE: 4:13 PM Discussed treatment plan with patient at beside, the patient agrees with the plan and has no further questions at this time.   Labs Review Labs Reviewed  URINALYSIS, ROUTINE W REFLEX MICROSCOPIC - Abnormal; Notable for the following:    APPearance CLOUDY (*)    Hgb urine dipstick LARGE (*)    All other components within normal limits  COMPREHENSIVE METABOLIC PANEL - Abnormal; Notable for the following:    Glucose, Bld 130 (*)    All other components within normal limits  URINE CULTURE  URINE MICROSCOPIC-ADD ON  CBC WITH DIFFERENTIAL/PLATELET    Imaging Review Ct Abdomen Pelvis W Contrast  10/14/2014   CLINICAL DATA:  69 year old male with right abdominal and pelvic pain and hematuria for 6 hours. History of prostate cancer and prostatectomy.  EXAM: CT ABDOMEN AND PELVIS WITH CONTRAST  TECHNIQUE: Multidetector CT imaging of the abdomen and pelvis was performed using the standard protocol following bolus administration of intravenous contrast.  CONTRAST:   12mL OMNIPAQUE IOHEXOL 300 MG/ML  SOLN  COMPARISON:  03/11/2010 CT  FINDINGS: Lower chest: Evidence of prior granulomatous disease within the lower chest again identified. A moderate hiatal hernia is again noted. Coronary artery calcifications noted.  Hepatobiliary: No significant hepatic abnormalities are noted. Cholelithiasis identified without CT evidence of acute cholecystitis. There is no evidence of biliary dilatation.  Pancreas: Unremarkable  Spleen: Small splenic calcifications again noted.  Adrenals/Urinary Tract: Mild bilateral renal cortical atrophy and bilateral renal cysts again noted. There is no evidence of hydronephrosis or urinary calculi.  Stomach/Bowel: Scattered colonic diverticula noted without evidence of diverticulitis. There is no evidence of bowel obstruction or focal bowel wall  thickening.  Vascular/Lymphatic: No enlarged lymph nodes or abdominal aortic aneurysm.  Reproductive: Prostatectomy changes noted.  Other: No free fluid, abscess or pneumoperitoneum.  Musculoskeletal: No acute or suspicious abnormalities.  IMPRESSION: No evidence of acute abnormality.  Moderate hiatal hernia, cholelithiasis, colonic diverticulosis, coronary artery disease and mild bilateral renal cortical atrophy.   Electronically Signed   By: Margarette Canada M.D.   On: 10/14/2014 18:21     EKG Interpretation   Date/Time:  Saturday Oct 14 2014 14:59:05 EDT Ventricular Rate:  53 PR Interval:  200 QRS Duration: 90 QT Interval:  440 QTC Calculation: 412 R Axis:   15 Text Interpretation:  Sinus bradycardia Otherwise normal ECG No previous  ECGs available Confirmed by YAO  MD, DAVID ([id number]) on 10/14/2014 4:03:18 PM      MDM   Final diagnoses:  Abdominal pain   Nicholas Owen is a 69 y.o. male here with RLQ pain, dysuria. Consider appendicitis vs colitis vs diverticulitis vs UTI vs renal colic. Will get labs, CT ab/pel, UA.   6:48 PM Labs unremarkable. UA + blood, ? UTI. CT showed diverticulosis no  diverticulitis. Given that he has dysuria, will treat with cipro empirically. He may have a small kidney stone not picked up ct with contrast but has no hydro. Will dc with cipro, percocet, motrin. Will have him see his urologist.    I personally performed the services described in this documentation, which was scribed in my presence. The recorded information has been reviewed and is accurate.    Wandra Arthurs, MD 10/14/14 [phone]

## 2014-10-14 NOTE — ED Notes (Signed)
Patient awoke this am with dysuria, RLQ abdominal pain with nausea since awakening this am. Patient reports that it feels like when he was diagnosed with diverticulitis

## 2014-10-14 NOTE — Discharge Instructions (Signed)
Take cipro as prescribed.  Take motrin for pain.  Take percocet for severe pain.   See your urologist.   Return to ER if you have severe pain, fever, vomiting.

## 2014-10-16 LAB — URINE CULTURE
COLONY COUNT: NO GROWTH
Culture: NO GROWTH

## 2014-10-17 ENCOUNTER — Ambulatory Visit (HOSPITAL_BASED_OUTPATIENT_CLINIC_OR_DEPARTMENT_OTHER): Payer: Medicare Other | Admitting: Family

## 2014-10-17 ENCOUNTER — Ambulatory Visit (HOSPITAL_BASED_OUTPATIENT_CLINIC_OR_DEPARTMENT_OTHER): Payer: Medicare Other | Admitting: Nurse Practitioner

## 2014-10-17 DIAGNOSIS — R309 Painful micturition, unspecified: Secondary | ICD-10-CM

## 2014-10-17 DIAGNOSIS — D6862 Lupus anticoagulant syndrome: Secondary | ICD-10-CM

## 2014-10-17 DIAGNOSIS — I2699 Other pulmonary embolism without acute cor pulmonale: Secondary | ICD-10-CM

## 2014-10-17 DIAGNOSIS — R760 Raised antibody titer: Secondary | ICD-10-CM

## 2014-10-17 LAB — CBC WITH DIFFERENTIAL (CANCER CENTER ONLY)
BASO#: 0 10*3/uL (ref 0.0–0.2)
BASO%: 0.5 % (ref 0.0–2.0)
EOS%: 13.3 % — AB (ref 0.0–7.0)
Eosinophils Absolute: 0.8 10*3/uL — ABNORMAL HIGH (ref 0.0–0.5)
HCT: 43.5 % (ref 38.7–49.9)
HGB: 15.3 g/dL (ref 13.0–17.1)
LYMPH#: 1.2 10*3/uL (ref 0.9–3.3)
LYMPH%: 21.2 % (ref 14.0–48.0)
MCH: 30.2 pg (ref 28.0–33.4)
MCHC: 35.2 g/dL (ref 32.0–35.9)
MCV: 86 fL (ref 82–98)
MONO#: 0.6 10*3/uL (ref 0.1–0.9)
MONO%: 10.8 % (ref 0.0–13.0)
NEUT#: 3.2 10*3/uL (ref 1.5–6.5)
NEUT%: 54.2 % (ref 40.0–80.0)
Platelets: 261 10*3/uL (ref 145–400)
RBC: 5.07 10*6/uL (ref 4.20–5.70)
RDW: 13.4 % (ref 11.1–15.7)
WBC: 5.9 10*3/uL (ref 4.0–10.0)

## 2014-10-17 NOTE — Progress Notes (Signed)
Hematology and Oncology Follow Up Visit  Nicholas Owen [id number] 11-Oct-1945 69 y.o. 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 He was recently in the ED for abdominal pain and burning with urination. His CT of the abdomen and pelvis showed no evidence of acute abnormality. He is on Cipro for a UTI at this time. He also has an appointment with Urology today for further evaluation. He has had kidney stones in the past. He has had no more abdominal pain since he left the ED and has not had to take his pain medication.  He finished 2 years of Xarelto in November 2015 and is now on 1 full dose aspirin daily.  He has had no bruising or episodes of bleeding.  His last CT angio in June 2014 showed no evidence of PE.  In April, his D- dimer was 0.51.  He denies fever, chills, n/v, cough, rash, headache, dizziness, SOB, chest pain, palpitations, abdominal pain, constipation, diarrhea, blood in urine or stool.  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 So far, there has been no evidence of recurrent prostate cancer. His last PSA in April 2015 was 0.00.  No swelling, numbness or tingling in his extremities. No new aches or pains.  His appetite is good and he is making sure to stay well hydrated.   Medications:    Medication List       This list is accurate as of: 10/17/14 12:33 PM.  Always use your most recent med list.               aspirin 325 MG tablet  Take 325 mg by mouth daily.     beta carotene w/minerals tablet  Take 1 tablet by mouth daily.     ciprofloxacin 500 MG tablet  Commonly known as:  CIPRO  Take 1 tablet (500 mg total) by mouth 2 (two) times daily. One po bid x 7 days     ibuprofen 800 MG tablet  Commonly known as:  ADVIL,MOTRIN  Take 1 tablet (800 mg total) by mouth every 8  (eight) hours as needed.     omeprazole 20 MG capsule  Commonly known as:  PRILOSEC  Take 1 capsule (20 mg total) by mouth daily.     oxyCODONE-acetaminophen 5-325 MG per tablet  Commonly known as:  PERCOCET  Take 1-2 tablets by mouth every 4 (four) hours as needed.     pravastatin 20 MG tablet  Commonly known as:  PRAVACHOL  Take 1 tablet (20 mg total) by mouth daily.        Allergies:  Allergies  Allergen Reactions  . Nsaids     colitis    Past Medical History, Surgical history, Social history, and Family History were reviewed and updated.  Review of Systems: All other 10 point review of systems is negative.   Physical Exam:  vitals were not taken for this visit.  Wt Readings from Last 3 Encounters:  10/17/14 241 lb (109.317 kg)  10/14/14 240 lb (108.863 kg)  09/20/14 238 lb 12.8 oz (108.319 kg)    Ocular: Sclerae unicteric, pupils equal, round and reactive to light Ear-nose-throat: Oropharynx clear, dentition fair Lymphatic: No cervical or supraclavicular adenopathy Lungs no rales or rhonchi, good excursion bilaterally Heart regular rate and rhythm, no murmur appreciated Abd soft, nontender, positive bowel sounds  MSK no focal spinal tenderness, no joint edema Neuro: non-focal, well-oriented, appropriate affect  Lab Results  Component Value Date   WBC 5.9 10/17/2014   HGB 15.3 10/17/2014   HCT 43.5 10/17/2014   MCV 86 10/17/2014   PLT 261 10/17/2014   No results found for: FERRITIN, IRON, TIBC, UIBC, IRONPCTSAT Lab Results  Component Value Date   RBC 5.07 10/17/2014   No results found for: KPAFRELGTCHN, LAMBDASER, KAPLAMBRATIO No results found for: IGGSERUM, IGA, IGMSERUM No results found for: Odetta Pink, SPEI   Chemistry      Component Value Date/Time   NA 140 10/14/2014 1600   NA 139 06/16/2014 1206   K 4.4 10/14/2014 1600   K 4.4 06/16/2014 1206   CL 106 10/14/2014 1600   CO2 24  10/14/2014 1600   CO2 24 06/16/2014 1206   BUN 20 10/14/2014 1600   BUN 17.2 06/16/2014 1206   CREATININE 1.17 10/14/2014 1600   CREATININE 0.9 06/16/2014 1206      Component Value Date/Time   CALCIUM 9.1 10/14/2014 1600   CALCIUM 8.9 06/16/2014 1206   ALKPHOS 64 10/14/2014 1600   ALKPHOS 74 06/16/2014 1206   AST 23 10/14/2014 1600   AST 19 06/16/2014 1206   ALT 22 10/14/2014 1600   ALT 22 06/16/2014 1206   BILITOT 1.0 10/14/2014 1600   BILITOT 0.92 06/16/2014 1206     Impression and Plan: Nicholas Owen is 68 year old gentleman with history of pulmonary embolism secondary to a positive lupus anticoagulant. He completed 2 years of Xarelto in November 2015 and is now on Aspirin 325 mg daily.  He is doing well and has had no episodes of bleeding or thrombotic complications. He will continue taking his 325 mg Aspirin daily life long.  We will see what his lupus anticoagulant panel today shows.  We now follow-up with him only as needed. He will contact us with any questions or concerns.  We can certainly see him for any hematologic issues in the future.    Eliezer Bottom, NP 5/17/201612:33 PM

## 2014-10-18 LAB — LUPUS ANTICOAGULANT PANEL
DRVVT 1:1 Mix: 57.4 secs — ABNORMAL HIGH (ref <42.9)
DRVVT: 76.3 secs — ABNORMAL HIGH (ref <42.9)
Drvvt confirmation: 2.3 Ratio — ABNORMAL HIGH (ref <1.15)
Lupus Anticoagulant: DETECTED — AB
PTT Lupus Anticoagulant: 56.4 secs — ABNORMAL HIGH (ref 28.0–43.0)
PTTLA 4:1 Mix: 63.1 secs — ABNORMAL HIGH (ref 28.0–43.0)
PTTLA Confirmation: 22.9 secs — ABNORMAL HIGH (ref <8.0)

## 2014-10-18 LAB — D-DIMER, QUANTITATIVE: D-Dimer, Quant: 0.86 ug/mL-FEU — ABNORMAL HIGH (ref 0.00–0.48)

## 2014-10-18 LAB — COMPREHENSIVE METABOLIC PANEL
ALK PHOS: 58 U/L (ref 39–117)
ALT: 21 U/L (ref 0–53)
AST: 18 U/L (ref 0–37)
Albumin: 4 g/dL (ref 3.5–5.2)
BILIRUBIN TOTAL: 1.2 mg/dL (ref 0.2–1.2)
BUN: 18 mg/dL (ref 6–23)
CO2: 24 mEq/L (ref 19–32)
Calcium: 9.3 mg/dL (ref 8.4–10.5)
Chloride: 104 mEq/L (ref 96–112)
Creatinine, Ser: 0.95 mg/dL (ref 0.50–1.35)
GLUCOSE: 110 mg/dL — AB (ref 70–99)
Potassium: 4.3 mEq/L (ref 3.5–5.3)
SODIUM: 139 meq/L (ref 135–145)
Total Protein: 6.4 g/dL (ref 6.0–8.3)

## 2014-10-21 LAB — HEXAGONAL PHOSPHOLIPID NEUTRALIZATION: HEX PHOSPH NEUT TEST: POSITIVE — AB

## 2014-10-27 IMAGING — CT CT ANGIO CHEST
2 of 6 series · 19 of 36 positions shown · IV contrast (APPLIED)
Comparison: 04/13/2012

CLINICAL DATA: Follow-up PE, on blood thinners, history of prostate
cancer

CT ANGIOGRAPHY CHEST
TECHNIQUE: Multidetector CT imaging of the chest using the
standard protocol during bolus administration of intravenous
contrast. Multiplanar reconstructed images including MIPs were
obtained and reviewed to evaluate the vascular anatomy.
Contrast: 100mL OMNIPAQUE IOHEXOL 350 MG/ML SOLN

[Series 5: pe 1.0 b26f · axial · 0.77mm/px · z∈[-339,-42]mm · 18 of 331 slices shown]
[im 17/331  lung]
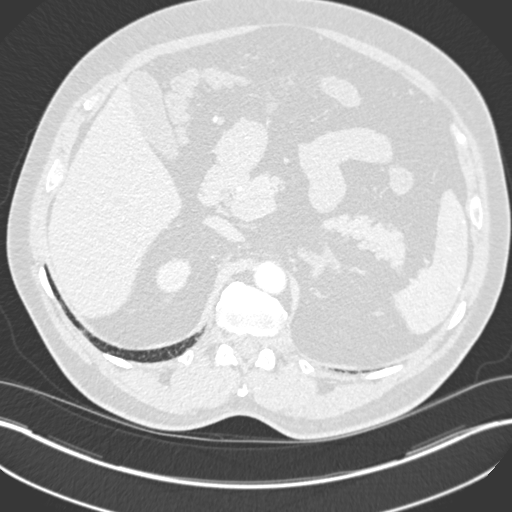
[im 34/331  mediastinal]
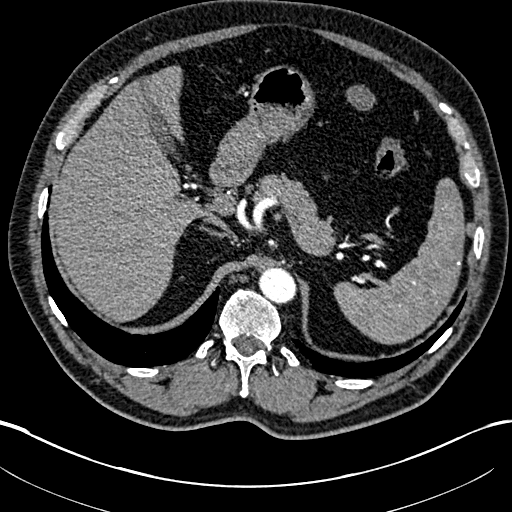
[im 50/331  lung]
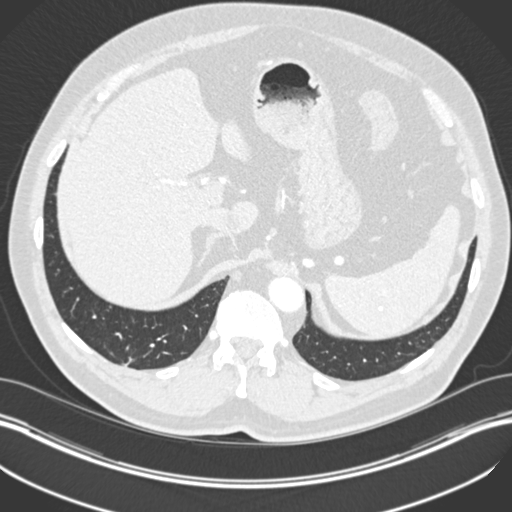
[im 67/331  mediastinal]
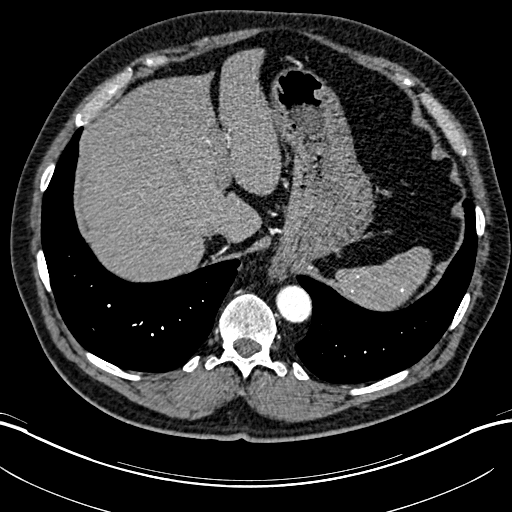
[im 83/331  lung]
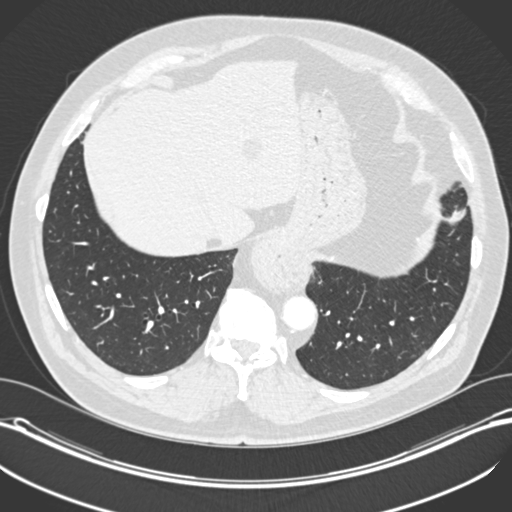
[im 100/331  mediastinal]
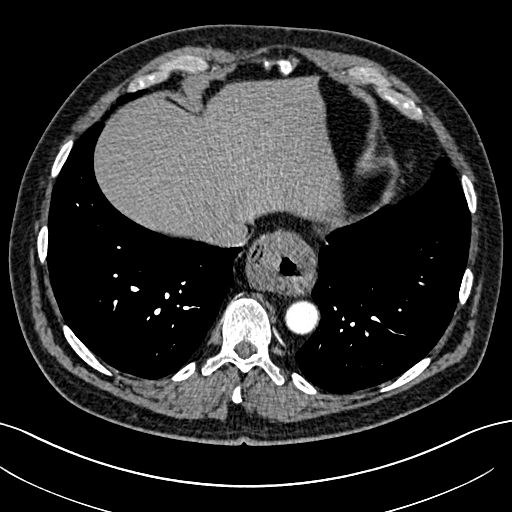
[im 116/331  lung]
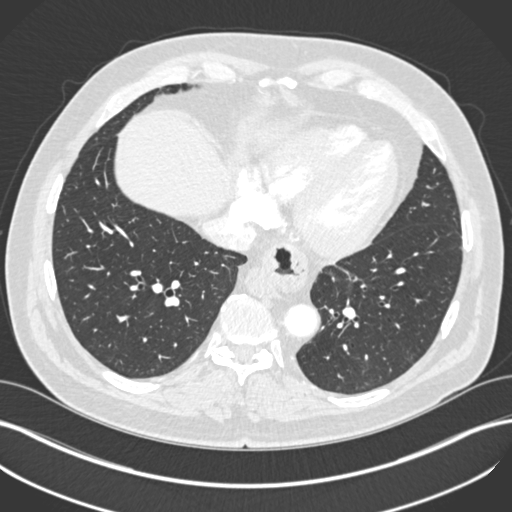
[im 133/331  mediastinal]
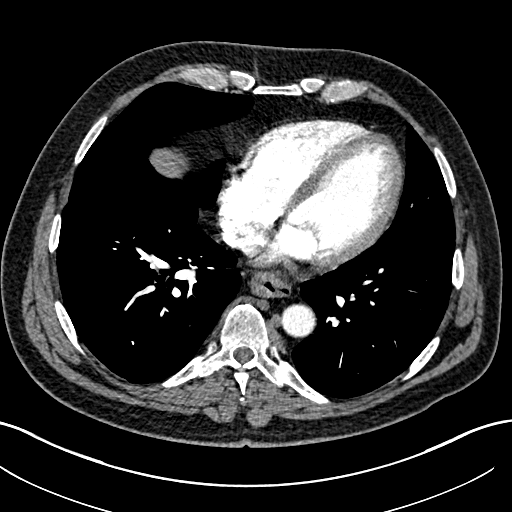
[im 149/331  lung]
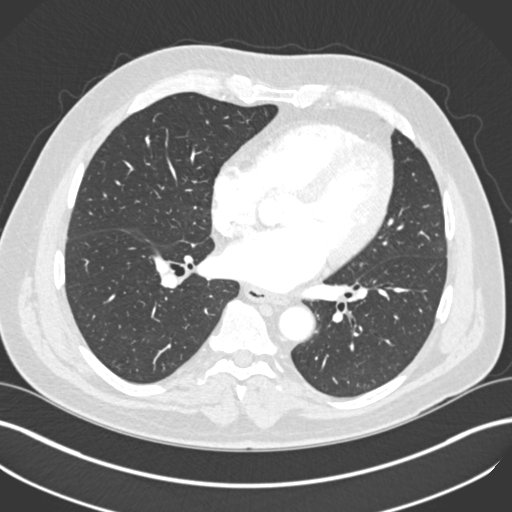
[im 182/331  mediastinal]
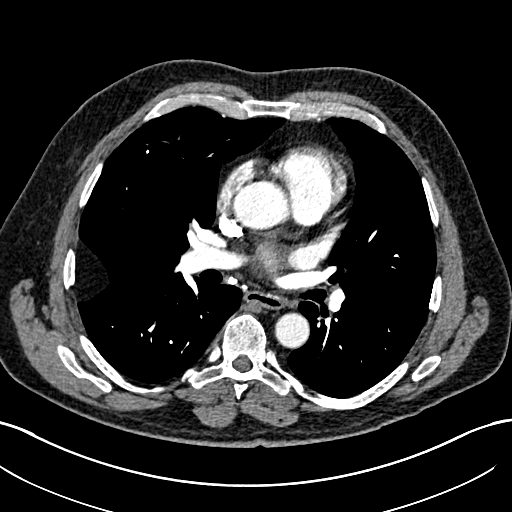
[im 199/331  lung]
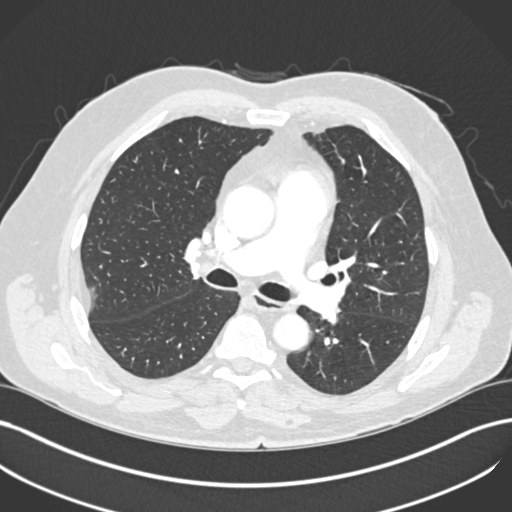
[im 215/331  mediastinal]
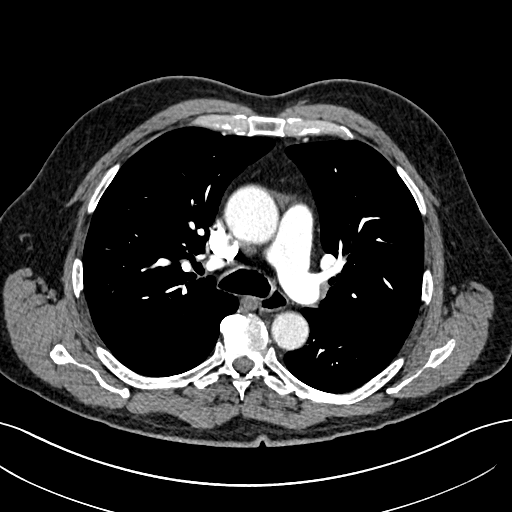
[im 232/331  lung]
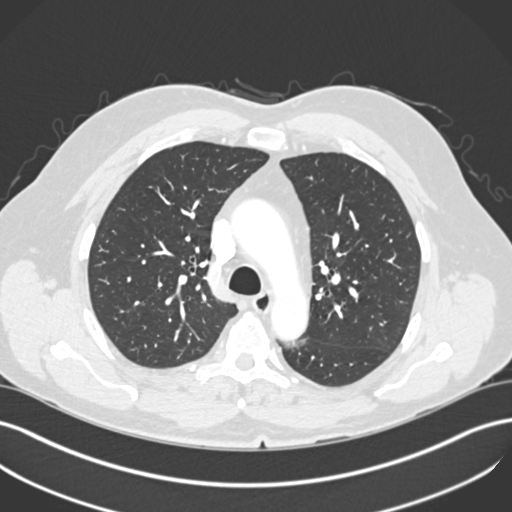
[im 248/331  mediastinal]
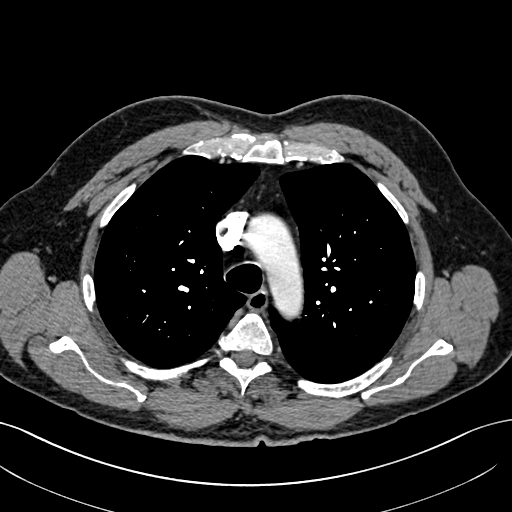
[im 265/331  lung]
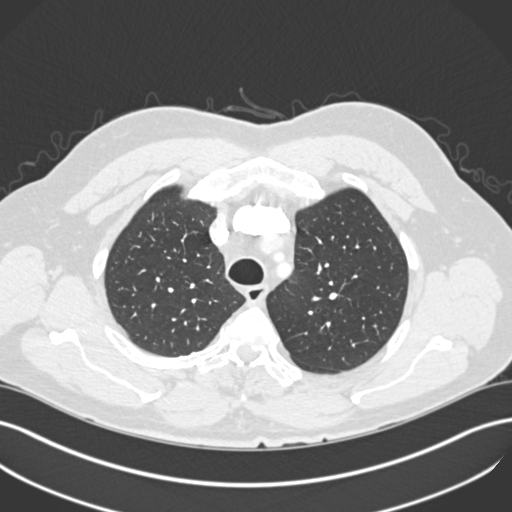
[im 281/331  mediastinal]
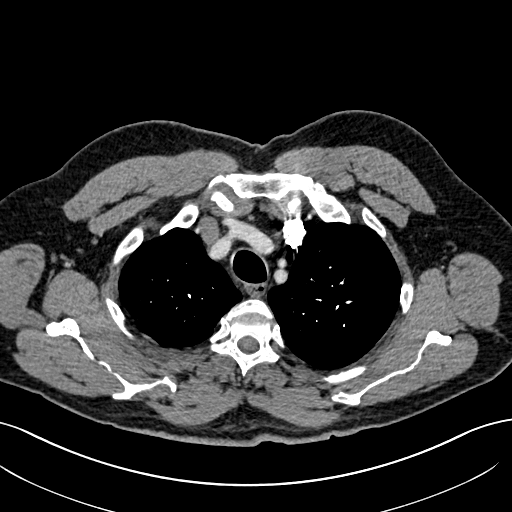
[im 298/331  lung]
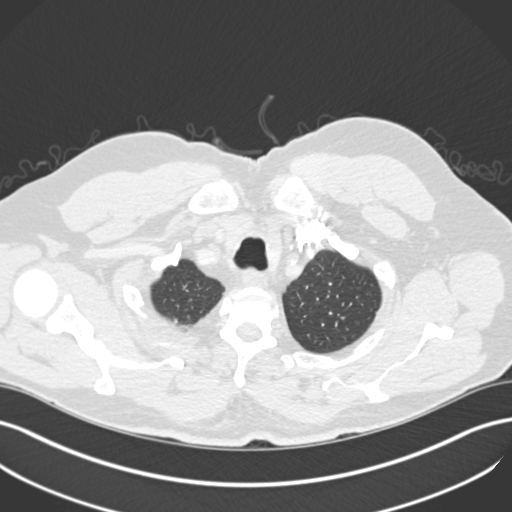
[im 314/331  mediastinal]
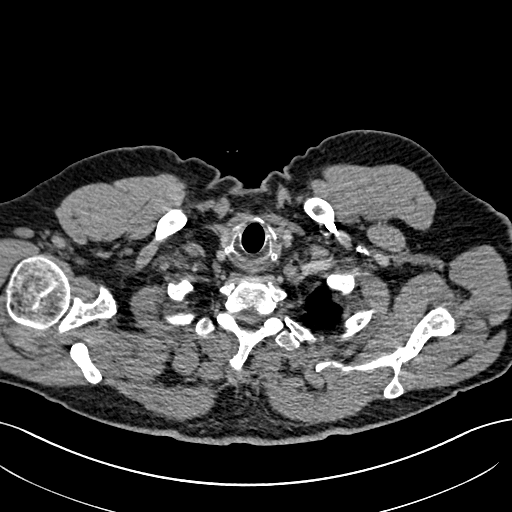

[Series 8: pe 2.0 coronal · coronal · 0.65mm/px · 1 of 158 slices shown]
[im 79/158  mediastinal]
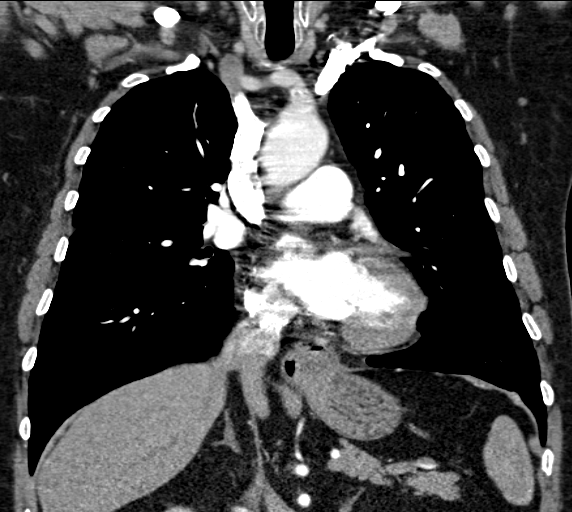

[19 of 36 positions shown; findings below may reference images not displayed]

FINDINGS: No evidence of pulmonary embolism.

No suspicious pulmonary nodules.  Calcified granulomata in the
right middle lobe (series 6/image 54).  Stable lipoma along the
right major fissure. No pleural effusion or pneumothorax.

Visualized thyroid is unremarkable.

The heart is normal in size.  No pericardial effusion.  Coronary
atherosclerosis.

Small calcified mediastinal lymph nodes which do not meet
pathologic CT size criteria.  No suspicious hilar or axillary
lymphadenopathy.

Visualized upper abdomen is notable for a moderate hiatal hernia,
calcified splenic granulomata, and small probable hepatic cyst.

Degenerative changes of the visualized thoracolumbar spine.
IMPRESSION: No evidence of pulmonary embolism.

No evidence of metastatic disease in the chest.

## 2014-11-17 ENCOUNTER — Emergency Department (HOSPITAL_BASED_OUTPATIENT_CLINIC_OR_DEPARTMENT_OTHER)
Admission: EM | Admit: 2014-11-17 | Discharge: 2014-11-17 | Disposition: A | Discharge status: Home / Self Care | Payer: Medicare Other | Attending: Emergency Medicine | Admitting: Emergency Medicine

## 2014-11-17 ENCOUNTER — Encounter (HOSPITAL_BASED_OUTPATIENT_CLINIC_OR_DEPARTMENT_OTHER): Payer: Self-pay | Admitting: *Deleted

## 2014-11-17 ENCOUNTER — Telehealth: Payer: Self-pay | Admitting: Internal Medicine

## 2014-11-17 DIAGNOSIS — Z86718 Personal history of other venous thrombosis and embolism: Secondary | ICD-10-CM | POA: Insufficient documentation

## 2014-11-17 DIAGNOSIS — R319 Hematuria, unspecified: Secondary | ICD-10-CM

## 2014-11-17 DIAGNOSIS — Z87442 Personal history of urinary calculi: Secondary | ICD-10-CM | POA: Diagnosis not present

## 2014-11-17 DIAGNOSIS — Z87438 Personal history of other diseases of male genital organs: Secondary | ICD-10-CM | POA: Insufficient documentation

## 2014-11-17 DIAGNOSIS — Z85828 Personal history of other malignant neoplasm of skin: Secondary | ICD-10-CM | POA: Diagnosis not present

## 2014-11-17 DIAGNOSIS — K219 Gastro-esophageal reflux disease without esophagitis: Secondary | ICD-10-CM | POA: Diagnosis not present

## 2014-11-17 DIAGNOSIS — Z86010 Personal history of colonic polyps: Secondary | ICD-10-CM | POA: Diagnosis not present

## 2014-11-17 DIAGNOSIS — N390 Urinary tract infection, site not specified: Secondary | ICD-10-CM | POA: Diagnosis not present

## 2014-11-17 DIAGNOSIS — Z79899 Other long term (current) drug therapy: Secondary | ICD-10-CM | POA: Insufficient documentation

## 2014-11-17 DIAGNOSIS — Z8546 Personal history of malignant neoplasm of prostate: Secondary | ICD-10-CM | POA: Insufficient documentation

## 2014-11-17 DIAGNOSIS — Z7982 Long term (current) use of aspirin: Secondary | ICD-10-CM | POA: Diagnosis not present

## 2014-11-17 LAB — CBC WITH DIFFERENTIAL/PLATELET
BASOS ABS: 0 10*3/uL (ref 0.0–0.1)
BASOS PCT: 0 % (ref 0–1)
EOS ABS: 0.4 10*3/uL (ref 0.0–0.7)
Eosinophils Relative: 4 % (ref 0–5)
HEMATOCRIT: 43.2 % (ref 39.0–52.0)
Hemoglobin: 14.9 g/dL (ref 13.0–17.0)
Lymphocytes Relative: 11 % — ABNORMAL LOW (ref 12–46)
Lymphs Abs: 0.9 10*3/uL (ref 0.7–4.0)
MCH: 30 pg (ref 26.0–34.0)
MCHC: 34.5 g/dL (ref 30.0–36.0)
MCV: 86.9 fL (ref 78.0–100.0)
MONO ABS: 0.8 10*3/uL (ref 0.1–1.0)
MONOS PCT: 9 % (ref 3–12)
Neutro Abs: 6.5 10*3/uL (ref 1.7–7.7)
Neutrophils Relative %: 76 % (ref 43–77)
Platelets: 252 10*3/uL (ref 150–400)
RBC: 4.97 MIL/uL (ref 4.22–5.81)
RDW: 13.5 % (ref 11.5–15.5)
WBC: 8.7 10*3/uL (ref 4.0–10.5)

## 2014-11-17 LAB — BASIC METABOLIC PANEL
ANION GAP: 10 (ref 5–15)
BUN: 22 mg/dL — ABNORMAL HIGH (ref 6–20)
CO2: 25 mmol/L (ref 22–32)
CREATININE: 1.09 mg/dL (ref 0.61–1.24)
Calcium: 9 mg/dL (ref 8.9–10.3)
Chloride: 103 mmol/L (ref 101–111)
GFR calc Af Amer: >60 mL/min (ref >60)
GFR calc non Af Amer: >60 mL/min (ref >60)
Glucose, Bld: 92 mg/dL (ref 65–99)
Potassium: 4.6 mmol/L (ref 3.5–5.1)
Sodium: 138 mmol/L (ref 135–145)

## 2014-11-17 LAB — URINE MICROSCOPIC-ADD ON

## 2014-11-17 LAB — URINALYSIS, ROUTINE W REFLEX MICROSCOPIC
Glucose, UA: NEGATIVE mg/dL
KETONES UR: 15 mg/dL — AB
Nitrite: POSITIVE — AB
Protein, ur: 100 mg/dL — AB
Specific Gravity, Urine: 1.024 (ref 1.005–1.030)
UROBILINOGEN UA: 1 mg/dL (ref 0.0–1.0)
pH: 6 (ref 5.0–8.0)

## 2014-11-17 MED ORDER — CEPHALEXIN 500 MG PO CAPS: 500.0000 mg | ORAL_CAPSULE | Freq: Four times a day (QID) | ORAL | Status: DC

## 2014-11-17 MED ORDER — PHENAZOPYRIDINE HCL 200 MG PO TABS: 200.0000 mg | ORAL_TABLET | Freq: Three times a day (TID) | ORAL | Status: DC

## 2014-11-17 MED ORDER — CEPHALEXIN 250 MG PO CAPS
500.0000 mg | ORAL_CAPSULE | Freq: Once | ORAL | Status: AC
  Administered 2014-11-17: 500 mg via ORAL
  Filled 2014-11-17: qty 2

## 2014-11-17 MED ORDER — PHENAZOPYRIDINE HCL 100 MG PO TABS
200.0000 mg | ORAL_TABLET | Freq: Once | ORAL | Status: AC
  Administered 2014-11-17: 200 mg via ORAL
  Filled 2014-11-17: qty 2

## 2014-11-17 NOTE — Telephone Encounter (Signed)
[facility] - Client [facility]     Patient Name: Jomel Laske Initial Comment Caller states he has blood in his urine.   DOB: 1945-11-09      Nurse Assessment  Nurse: Venetia Maxon, RN, Manuela Schwartz Date/Time (Eastern Time): 11/17/2014 5:02:24 PM  Confirm and document reason for call. If symptomatic, describe symptoms. ---Caller states he has blood in his urine. Just this afternoon. not much urine flow but blood. Had prostate removed 2 yrs ago. Burning and stinging . no fever   Has the patient traveled out of the country within the last 30 days? ---No   Does the patient require triage? ---Yes   Related visit to physician within the last 2 weeks? ---No   Does the PT have any chronic conditions? (i.e. diabetes, asthma, etc.) ---Yes   List chronic conditions. 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

## 2014-11-17 NOTE — Discharge Instructions (Signed)
Urinary Tract Infection Urinary tract infections (UTIs) can develop anywhere along your urinary tract. Your urinary tract is your body's drainage system for removing wastes and extra water. Your urinary tract includes two kidneys, two ureters, a bladder, and a urethra. Your kidneys are a pair of bean-shaped organs. Each kidney is about the size of your fist. They are located below your ribs, one on each side of your spine. CAUSES Infections are caused by microbes, which are microscopic organisms, including fungi, viruses, and bacteria. These organisms are so small that they can only be seen through a microscope. Bacteria are the microbes that most commonly cause UTIs. SYMPTOMS  Symptoms of UTIs may vary by age and gender of the patient and by the location of the infection. Symptoms in young women typically include a frequent and intense urge to urinate and a painful, burning feeling in the bladder or urethra during urination. Older women and men are more likely to be tired, shaky, and weak and have muscle aches and abdominal pain. A fever may mean the infection is in your kidneys. Other symptoms of a kidney infection include pain in your back or sides below the ribs, nausea, and vomiting. DIAGNOSIS To diagnose a UTI, your caregiver will ask you about your symptoms. Your caregiver also will ask to provide a urine sample. The urine sample will be tested for bacteria and white blood cells. White blood cells are made by your body to help fight infection. TREATMENT  Typically, UTIs can be treated with medication. Because most UTIs are caused by a bacterial infection, they usually can be treated with the use of antibiotics. The choice of antibiotic and length of treatment depend on your symptoms and the type of bacteria causing your infection. HOME CARE INSTRUCTIONS  If you were prescribed antibiotics, take them exactly as your caregiver instructs you. Finish the medication even if you feel better after you  have only taken some of the medication.  Drink enough water and fluids to keep your urine clear or pale yellow.  Avoid caffeine, tea, and carbonated beverages. They tend to irritate your bladder.  Empty your bladder often. Avoid holding urine for long periods of time.  Empty your bladder before and after sexual intercourse.  After a bowel movement, women should cleanse from front to back. Use each tissue only once. SEEK MEDICAL CARE IF:   You have back pain.  You develop a fever.  Your symptoms do not begin to resolve within 3 days. SEEK IMMEDIATE MEDICAL CARE IF:   You have severe back pain or lower abdominal pain.  You develop chills.  You have nausea or vomiting.  You have continued burning or discomfort with urination. MAKE SURE YOU:   Understand these instructions.  Will watch your condition.  Will get help right away if you are not doing well or get worse. Document Released: 02/26/2005 Document Revised: 11/18/2011 Document Reviewed: 06/27/2011 [facility] Patient Information 2015 [place], [place]. This information is not intended to replace advice given to you by your health care provider. Make sure you discuss any questions you have with your health care provider. Hematuria Hematuria is blood in your urine. It can be caused by a bladder infection, kidney infection, prostate infection, kidney stone, or cancer of your urinary tract. Infections can usually be treated with medicine, and a kidney stone usually will pass through your urine. If neither of these is the cause of your hematuria, further workup to find out the reason may be needed. It is very  important that you tell your health care provider about any blood you see in your urine, even if the blood stops without treatment or happens without causing pain. Blood in your urine that happens and then stops and then happens again can be a symptom of a very serious condition. Also, pain is not a symptom in the initial stages of  many urinary cancers. HOME CARE INSTRUCTIONS   Drink lots of fluid, 3-4 quarts a day. If you have been diagnosed with an infection, cranberry juice is especially recommended, in addition to large amounts of water.  Avoid caffeine, tea, and carbonated beverages because they tend to irritate the bladder.  Avoid alcohol because it may irritate the prostate.  Take all medicines as directed by your health care provider.  If you were prescribed an antibiotic medicine, finish it all even if you start to feel better.  If you have been diagnosed with a kidney stone, follow your health care provider's instructions regarding straining your urine to catch the stone.  Empty your bladder often. Avoid holding urine for long periods of time.  After a bowel movement, women should cleanse front to back. Use each tissue only once.  Empty your bladder before and after sexual intercourse if you are a male. SEEK MEDICAL CARE IF:  You develop back pain.  You have a fever.  You have a feeling of sickness in your stomach (nausea) or vomiting.  Your symptoms are not better in 3 days. Return sooner if you are getting worse. SEEK IMMEDIATE MEDICAL CARE IF:   You develop severe vomiting and are unable to keep the medicine down.  You develop severe back or abdominal pain despite taking your medicines.  You begin passing a large amount of blood or clots in your urine.  You feel extremely weak or faint, or you pass out. MAKE SURE YOU:   Understand these instructions.  Will watch your condition.  Will get help right away if you are not doing well or get worse. Document Released: 05/19/2005 Document Revised: 10/03/2013 Document Reviewed: 01/17/2013 [employer] Patient Information 2015 [place], [place]. This information is not intended to replace advice given to you by your health care provider. Make sure you discuss any questions you have with your health care provider.

## 2014-11-17 NOTE — ED Notes (Signed)
Dysuria and hematuria since this afternoon.

## 2014-11-17 NOTE — ED Provider Notes (Signed)
CSN: [id number]     Arrival date & time 11/17/14  1739 History   First MD Initiated Contact with Patient 11/17/14 1753     Chief Complaint  Patient presents with  . Hematuria   Patient is a 69 y.o. male presenting with hematuria. The history is provided by the patient.  Hematuria This is a new problem. Episode onset: this afternoon. The problem occurs constantly. The problem has not changed since onset.Pertinent negatives include no abdominal pain.  Pt noticed blood when he went to urinate.  It was just a small amount.  He called the nurse hotline and was told to come to the ED right away.  Past Medical History  Diagnosis Date  . Allergy   . Cancer     basal cell CA (skin)  . GERD (gastroesophageal reflux disease)   . BPH (benign prostatic hyperplasia)   . Hyperlipidemia   . Asbestos exposure   . Diverticulosis   . Colitis 01/2011  . Liver cyst 10/11    4 simple cysts in liver  . 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 Basal cell carcinoma     skin of left ear  . Kidney stones   . Lipoma 12-24-11    multiple(present- lipoma 2-lt./4- rt arms/ back/ chest  . Prostate cancer 12-24-11    dx. Prostate cancer after bx.  . Deviated nasal septum     hx. of  . DVT (deep venous thrombosis)   . Ulcerative colitis   . Colon polyp    Past Surgical History  Procedure Laterality Date  . Colonoscopy    . Polypectomy    . Knee surgery  2007    right knee scope  . Tonsillectomy    . Lipoma removals  1990's    multiple and multiple remains now  . Robot assisted laparoscopic radical prostatectomy  12/31/2011    Procedure: ROBOTIC ASSISTED LAPAROSCOPIC RADICAL PROSTATECTOMY;  Surgeon: Bernestine Amass, MD;  Location: [facility];  Service: Urology;  Laterality: N/A;      . Lymph node dissection  12/31/2011    Procedure: LYMPH NODE DISSECTION;  Surgeon: Bernestine Amass, MD;  Location: [facility];  Service: Urology;  Laterality: Bilateral;  Bilateral  . Inguinal hernia repair   1990's    double  . Cystoscopy  02/16/2012    Procedure: CYSTOSCOPY FLEXIBLE;  Surgeon: Bernestine Amass, MD;  Location: [facility];  Service: Urology;  Laterality: N/A;  FLEX CYSTO WITH REMOVAL OF STAPLES    Family History  Problem Relation Age of Onset  . Colon cancer Mother 54  . Colon cancer Father 34  . Esophageal cancer Neg Hx   . Stomach cancer Neg Hx   . Prostate cancer Paternal Grandfather    History  Substance Use Topics  . Smoking status: Never Smoker   . Smokeless tobacco: Never Used     Comment: never used tobacco  . Alcohol Use: Yes     Comment: rare -1 every couple months    Review of Systems  Constitutional: Negative for fever.  Gastrointestinal: Negative for nausea, vomiting, abdominal pain and diarrhea.  Genitourinary: Positive for hematuria.  All other systems reviewed and are negative.     Allergies  Nsaids  Home Medications   Prior to Admission medications   Medication Sig Start Date End Date Taking? Authorizing Provider  aspirin 325 MG tablet Take 325 mg by mouth daily.    Historical Provider,  MD  beta carotene w/minerals (OCUVITE) tablet Take 1 tablet by mouth daily.    Historical Provider, MD  cephALEXin (KEFLEX) 500 MG capsule Take 1 capsule (500 mg total) by mouth 4 (four) times daily. 11/17/14   Dorie Rank, MD  ciprofloxacin (CIPRO) 500 MG tablet Take 1 tablet (500 mg total) by mouth 2 (two) times daily. One po bid x 7 days 10/14/14   Wandra Arthurs, MD  ibuprofen (ADVIL,MOTRIN) 800 MG tablet Take 1 tablet (800 mg total) by mouth every 8 (eight) hours as needed. 10/14/14   Wandra Arthurs, MD  omeprazole (PRILOSEC) 20 MG capsule Take 1 capsule (20 mg total) by mouth daily. 09/20/14   Doe-Hyun R Shawna Orleans, DO  oxyCODONE-acetaminophen (PERCOCET) 5-325 MG per tablet Take 1-2 tablets by mouth every 4 (four) hours as needed. 10/14/14   Wandra Arthurs, MD  phenazopyridine (PYRIDIUM) 200 MG tablet Take 1 tablet (200 mg total) by mouth 3 (three) times daily.  11/17/14   Dorie Rank, MD  pravastatin (PRAVACHOL) 20 MG tablet Take 1 tablet (20 mg total) by mouth daily. 09/20/14   Doe-Hyun R Shawna Orleans, DO   BP 130/84 mmHg  Pulse 66  Temp(Src) 98.2 F (36.8 C) (Oral)  Resp 18  Ht 6' (1.829 m)  Wt 240 lb (108.863 kg)  BMI 32.54 kg/m2  SpO2 98% Physical Exam  Constitutional: He appears well-developed and well-nourished. No distress.  HENT:  Head: Normocephalic and atraumatic.  Right Ear: External ear normal.  Left Ear: External ear normal.  Eyes: Conjunctivae are normal. Right eye exhibits no discharge. Left eye exhibits no discharge. No scleral icterus.  Neck: Neck supple. No tracheal deviation present.  Cardiovascular: Normal rate, regular rhythm and intact distal pulses.   Pulmonary/Chest: Effort normal and breath sounds normal. No stridor. No respiratory distress. He has no wheezes. He has no rales.  Abdominal: Soft. Bowel sounds are normal. He exhibits no distension. There is no tenderness. There is no rebound and no guarding.  Musculoskeletal: He exhibits no edema or tenderness.  Neurological: He is alert. He has normal strength. No cranial nerve deficit (no facial droop, extraocular movements intact, no slurred speech) or sensory deficit. He exhibits normal muscle tone. He displays no seizure activity. Coordination normal.  Skin: Skin is warm and dry. No rash noted.  Psychiatric: He has a normal mood and affect.  Nursing note and vitals reviewed.   ED Course  Procedures (including critical care time) Labs Review Labs Reviewed  URINALYSIS, ROUTINE W REFLEX MICROSCOPIC (NOT AT [facility]) - Abnormal; Notable for the following:    Color, Urine RED (*)    APPearance TURBID (*)    Hgb urine dipstick LARGE (*)    Bilirubin Urine SMALL (*)    Ketones, ur 15 (*)    Protein, ur 100 (*)    Nitrite POSITIVE (*)    Leukocytes, UA LARGE (*)    All other components within normal limits  URINE MICROSCOPIC-ADD ON - Abnormal; Notable for the following:     Squamous Epithelial / LPF FEW (*)    Bacteria, UA FEW (*)    All other components within normal limits  CBC WITH DIFFERENTIAL/PLATELET - Abnormal; Notable for the following:    Lymphocytes Relative 11 (*)    All other components within normal limits  BASIC METABOLIC PANEL - Abnormal; Notable for the following:    BUN 22 (*)    All other components within normal limits  URINE CULTURE      MDM  Final diagnoses:  Hematuria  UTI (lower urinary tract infection)   Pt was seen in May for abdominal pain and hematuria.  Negative CT scan of the abdomen and pelvis.  Did follow up with urology.  Not having any pain other than dysuria.  Doubt ureteral colic.  UA today with possible uti.  Will start patient on oral abx. Urine culture sent.  May end up needing further evaluation with urology, ie possible cystoscopy.     Dorie Rank, MD 11/17/14 [phone]

## 2014-11-20 LAB — URINE CULTURE

## 2014-11-20 NOTE — Telephone Encounter (Signed)
Patient Name: Nicholas Owen Gender: Male DOB: [birth year] Age: 69 Y 56 M 21 D Return Phone Number: [phone] (Primary), [phone] (Secondary) Address: [address] City/State/Zip: [place] [address] Client Evangeline Day - Client Client Site [place] - Day Physician Shawna Orleans, Doe Kingsley Plan "Rob" Contact Type Call Call Type Triage / Clinical Relationship To Patient Self Appointment Disposition EMR Appointment Not Necessary Info pasted into Epic Yes Return Phone Number [phone] (Primary) Chief Complaint Urine, Blood In Initial Comment Caller states he has blood in his urine. PreDisposition Go to ED Nurse Assessment Nurse: Venetia Maxon, RN, Manuela Schwartz Date/Time (Eastern Time): 11/17/2014 5:02:24 PM Confirm and document reason for call. If symptomatic, describe symptoms. ---Caller states he has blood in his urine. Just this afternoon. not much urine flow but blood. Had prostate removed 2 yrs ago. Burning and stinging . no fever Has the patient traveled out of the country within the last 30 days? ---No Does the patient require triage? ---Yes Related visit to physician within the last 2 weeks? ---No Does the PT have any chronic conditions? (i.e. diabetes, asthma, etc.) ---Yes List chronic conditions. 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 Time Eilene Ghazi Time) Disposition Final User 11/17/2014 5:06:02 PM Go to ED Now Yes Venetia Maxon, RN, Manuela Schwartz

## 2014-11-21 ENCOUNTER — Telehealth (HOSPITAL_BASED_OUTPATIENT_CLINIC_OR_DEPARTMENT_OTHER): Payer: Self-pay | Admitting: Emergency Medicine

## 2014-11-21 NOTE — Telephone Encounter (Signed)
Post ED Visit - Positive Culture Follow-up  Culture report reviewed by antimicrobial stewardship pharmacist: []  Wes Sheridan, Pharm.D., BCPS []  Heide Guile, Pharm.D., BCPS []  Alycia Rossetti, Pharm.D., BCPS []  [place], [place].D., BCPS, AAHIVP [x]  Legrand Como, Pharm.D., BCPS, AAHIVP []  Isac Sarna, Pharm.D., BCPS  Positive urine culture E. Coli Treated with cephalexin, organism sensitive to the same and no further patient follow-up is required at this time.  Hazle Nordmann 11/21/2014, 3:42 PM

## 2014-11-27 ENCOUNTER — Other Ambulatory Visit: Payer: Self-pay

## 2014-12-08 ENCOUNTER — Other Ambulatory Visit: Payer: Self-pay | Admitting: *Deleted

## 2015-02-26 ENCOUNTER — Ambulatory Visit: Payer: Medicare Other | Admitting: *Deleted

## 2015-02-26 ENCOUNTER — Telehealth: Payer: Self-pay | Admitting: Internal Medicine

## 2015-02-26 NOTE — Telephone Encounter (Signed)
Pt would like to switch to dr Retail banker. Can I create 30 min slot? Pt no longer want to see dr Shawna Orleans.

## 2015-02-27 NOTE — Telephone Encounter (Signed)
Is this switch ok? 

## 2015-02-27 NOTE — Telephone Encounter (Signed)
Yes that's fine 

## 2015-02-28 ENCOUNTER — Ambulatory Visit: Payer: Medicare Other | Admitting: Internal Medicine

## 2015-02-28 ENCOUNTER — Ambulatory Visit (INDEPENDENT_AMBULATORY_CARE_PROVIDER_SITE_OTHER): Payer: Medicare Other | Admitting: *Deleted

## 2015-02-28 DIAGNOSIS — Z23 Encounter for immunization: Secondary | ICD-10-CM | POA: Diagnosis not present

## 2015-03-06 NOTE — Telephone Encounter (Signed)
lmom for pt to call back

## 2015-03-06 NOTE — Telephone Encounter (Signed)
Pt has been scheduled.  °

## 2015-03-12 ENCOUNTER — Ambulatory Visit: Payer: Medicare Other | Admitting: Internal Medicine

## 2015-04-30 ENCOUNTER — Encounter: Payer: Self-pay | Admitting: Family Medicine

## 2015-04-30 ENCOUNTER — Ambulatory Visit (INDEPENDENT_AMBULATORY_CARE_PROVIDER_SITE_OTHER): Payer: Medicare Other | Admitting: Family Medicine

## 2015-04-30 VITALS — BP 120/82 | HR 76 | Temp 98.1°F | Wt 254.0 lb

## 2015-04-30 DIAGNOSIS — E785 Hyperlipidemia, unspecified: Secondary | ICD-10-CM | POA: Diagnosis not present

## 2015-04-30 DIAGNOSIS — Z23 Encounter for immunization: Secondary | ICD-10-CM | POA: Diagnosis not present

## 2015-04-30 DIAGNOSIS — K219 Gastro-esophageal reflux disease without esophagitis: Secondary | ICD-10-CM | POA: Diagnosis not present

## 2015-04-30 DIAGNOSIS — D6862 Lupus anticoagulant syndrome: Secondary | ICD-10-CM | POA: Insufficient documentation

## 2015-04-30 DIAGNOSIS — R739 Hyperglycemia, unspecified: Secondary | ICD-10-CM

## 2015-04-30 NOTE — Assessment & Plan Note (Signed)
S: We discussed potential evolution and diabetes with last A1c of 6. Patient drinking at least 2 sodas a day [facility]. He is going to try to moderate this to once a week or every other week slowly. Also encouraged to exercise A/P: Healthy lifestyle counseling provided. Patient has a lot of low hanging fruit.

## 2015-04-30 NOTE — Assessment & Plan Note (Signed)
S: History of bilateral pulmonary embolism in 2013 sometime after his prostatectomy. He is also had dvt as origin of this. Also had extensive thrombophlebitis in the past. He has seen oncology but was released earlier this year after 2 years Xarelto. He had no recurrence on aspirin alone over 3 months when he last saw Dr. Marin Olp. Denies chest pain or calf pain or swelling. Coagulation workup revealed lupus anticoagulant positive. A/P: Continue aspirin 325. Appears Dr. Shawna Orleans was following d-dimer's. We will consider this at follow-up. Patient would definitely need lifelong therapy of anticoagulation if recurrence

## 2015-04-30 NOTE — Progress Notes (Signed)
Nicholas Reddish, MD  Subjective:  Nicholas Owen is a 69 y.o. year old very pleasant male patient who presents for/with See problem oriented charting ROS- No chest pain or shortness of breath. No headache or blurry vision. No calf pain or swelling.  Past Medical History-  Patient Active Problem List   Diagnosis Date Noted  . 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 History of DVT of lower extremity 08/31/2012    Priority: High  . History of pulmonary embolism 04/13/2012    Priority: High  . History of prostate cancer 05/14/2011    Priority: High  . Urinary incontinence 09/20/2014    Priority: Medium  . Diverticulosis 03/22/2014    Priority: Medium  . Hyperglycemia 05/12/2012    Priority: Medium  . GERD 10/04/2007    Priority: Medium  . Hyperlipidemia 03/08/2007    Priority: Medium  . Erectile dysfunction 03/22/2014    Priority: Low  . Personal history of colonic polyps 08/31/2012    Priority: Low  . Basal cell carcinoma     Priority: Low  . Obesity 10/08/2011    Priority: Low  . 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 Kidney stone 06/02/2010    Priority: Low  . Allergic rhinitis 10/04/2007    Priority: Low    Medications- reviewed and updated Current Outpatient Prescriptions  Medication Sig Dispense Refill  . aspirin 325 MG tablet Take 325 mg by mouth daily.    . beta carotene w/minerals (OCUVITE) tablet Take 1 tablet by mouth daily.    Marland Kitchen omeprazole (PRILOSEC) 20 MG capsule Take 1 capsule (20 mg total) by mouth daily. 90 capsule 1  . phenazopyridine (PYRIDIUM) 200 MG tablet Take 1 tablet (200 mg total) by mouth 3 (three) times daily. 6 tablet 0  . pravastatin (PRAVACHOL) 20 MG tablet Take 1 tablet (20 mg total) by mouth daily. 90 tablet 1  . ibuprofen (ADVIL,MOTRIN) 800 MG tablet Take 1 tablet (800 mg total) by mouth every 8 (eight) hours as needed. (Patient not taking: Reported on 04/30/2015) 20 tablet 0   No  current facility-administered medications for this visit.    Objective: BP 120/82 mmHg  Pulse 76  Temp(Src) 98.1 F (36.7 C)  Wt 254 lb (115.214 kg) Gen: NAD, resting comfortably CV: RRR no murmurs rubs or gallops Lungs: CTAB no crackles, wheeze, rhonchi Abdomen: soft/nontender/nondistended/normal bowel sounds. No rebound or guarding.  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 He is also had dvt as origin of this. Also had extensive thrombophlebitis in the past. He has seen oncology but was released earlier this year after 2 years Xarelto. He had no recurrence on aspirin alone over 3 months when he last saw Dr. Marin Olp. Denies chest pain or calf pain or swelling. Coagulation workup revealed lupus anticoagulant positive. A/P: Continue aspirin 325. Appears Dr. Shawna Orleans was following d-dimer's. We will consider this at follow-up. Patient would definitely need lifelong therapy of anticoagulation if recurrence   Hyperlipidemia S: Well controlled on pravastatin 20 mg. No myalgias.  Lab Results  Component Value Date   CHOL 151 09/20/2014   HDL 36.00* 09/20/2014   LDLCALC 95 09/20/2014   TRIG 98.0 09/20/2014   CHOLHDL 4 09/20/2014   A/P: Continue current prescription.    GERD S: Controlled with omeprazole 20 mg every other day.  Recurrence of symptoms if takes less frequently than this A/P: Continue current prescription   Hyperglycemia S: We discussed potential evolution and diabetes with last A1c of 6. Patient drinking at least 2 sodas a day [facility]. He is going to try to moderate this to once a week or every other week slowly. Also encouraged to exercise A/P: Healthy lifestyle counseling provided. Patient has a lot of low hanging fruit.   6 months follow-up for physical. Decide on labs at that time. Consider  A1c and d-dimer. No PSA. Return precautions advised.   Orders Placed This Encounter  Procedures  . Pneumococcal conjugate vaccine 13-valent

## 2015-04-30 NOTE — Assessment & Plan Note (Signed)
S: Controlled with omeprazole 20 mg every other day. Recurrence of symptoms if takes less frequently than this A/P: Continue current prescription

## 2015-04-30 NOTE — Assessment & Plan Note (Signed)
S: Well controlled on pravastatin 20 mg. No myalgias.  Lab Results  Component Value Date   CHOL 151 09/20/2014   HDL 36.00* 09/20/2014   LDLCALC 95 09/20/2014   TRIG 98.0 09/20/2014   CHOLHDL 4 09/20/2014   A/P: Continue current prescription.

## 2015-04-30 NOTE — Patient Instructions (Addendum)
Final pneumonia [id number]) shot received today.  Great to see you again and to meet your wife!   Glad we got to go through your full problem list- things seem to be pretty stable right now  Biggest concern- increased risk for diabetes. Would try to cut the soda down slowly to 1 every week or every other week. Increase veggies in diet. Work on regular exercise (water walking perhaps?)  Come in fasting and we can update labs at your physical

## 2015-05-17 ENCOUNTER — Other Ambulatory Visit: Payer: Self-pay | Admitting: Internal Medicine

## 2015-06-06 ENCOUNTER — Other Ambulatory Visit: Payer: Self-pay | Admitting: Family Medicine

## 2015-06-06 ENCOUNTER — Telehealth: Payer: Self-pay | Admitting: Family Medicine

## 2015-06-06 DIAGNOSIS — Z8546 Personal history of malignant neoplasm of prostate: Secondary | ICD-10-CM

## 2015-06-06 NOTE — Telephone Encounter (Signed)
Pt called hoping to get a referral to [facility] located on [address] for an appt on December 03, 2015 for f/u prostate removal. He is scheduled for labs with them on 11/26/15. Pt has [employer] insurance.

## 2015-06-06 NOTE — Telephone Encounter (Signed)
Referral placed.

## 2015-07-11 DIAGNOSIS — D3617 Benign neoplasm of peripheral nerves and autonomic nervous system of trunk, unspecified: Secondary | ICD-10-CM | POA: Diagnosis not present

## 2015-07-11 DIAGNOSIS — L570 Actinic keratosis: Secondary | ICD-10-CM | POA: Diagnosis not present

## 2015-07-11 DIAGNOSIS — X32XXXD Exposure to sunlight, subsequent encounter: Secondary | ICD-10-CM | POA: Diagnosis not present

## 2015-07-11 DIAGNOSIS — C44519 Basal cell carcinoma of skin of other part of trunk: Secondary | ICD-10-CM | POA: Diagnosis not present

## 2015-07-11 DIAGNOSIS — D045 Carcinoma in situ of skin of trunk: Secondary | ICD-10-CM | POA: Diagnosis not present

## 2015-07-11 DIAGNOSIS — D235 Other benign neoplasm of skin of trunk: Secondary | ICD-10-CM | POA: Diagnosis not present

## 2015-08-14 DIAGNOSIS — Z08 Encounter for follow-up examination after completed treatment for malignant neoplasm: Secondary | ICD-10-CM | POA: Diagnosis not present

## 2015-08-14 DIAGNOSIS — L918 Other hypertrophic disorders of the skin: Secondary | ICD-10-CM | POA: Diagnosis not present

## 2015-08-14 DIAGNOSIS — Z85828 Personal history of other malignant neoplasm of skin: Secondary | ICD-10-CM | POA: Diagnosis not present

## 2015-10-09 ENCOUNTER — Other Ambulatory Visit: Payer: Self-pay

## 2015-10-09 MED ORDER — PRAVASTATIN SODIUM 20 MG PO TABS: 20.0000 mg | ORAL_TABLET | Freq: Every day | ORAL | Status: DC

## 2015-10-09 MED ORDER — OMEPRAZOLE 20 MG PO CPDR: 20.0000 mg | DELAYED_RELEASE_CAPSULE | Freq: Every day | ORAL | Status: DC

## 2015-10-23 ENCOUNTER — Other Ambulatory Visit (INDEPENDENT_AMBULATORY_CARE_PROVIDER_SITE_OTHER): Payer: Commercial Managed Care - HMO

## 2015-10-23 DIAGNOSIS — Z0000 Encounter for general adult medical examination without abnormal findings: Secondary | ICD-10-CM

## 2015-10-23 DIAGNOSIS — Z125 Encounter for screening for malignant neoplasm of prostate: Secondary | ICD-10-CM

## 2015-10-23 LAB — HEPATIC FUNCTION PANEL
ALBUMIN: 4.2 g/dL (ref 3.5–5.2)
ALT: 18 U/L (ref 0–53)
AST: 16 U/L (ref 0–37)
Alkaline Phosphatase: 56 U/L (ref 39–117)
Bilirubin, Direct: 0.2 mg/dL (ref 0.0–0.3)
Total Bilirubin: 1.2 mg/dL (ref 0.2–1.2)
Total Protein: 6 g/dL (ref 6.0–8.3)

## 2015-10-23 LAB — CBC WITH DIFFERENTIAL/PLATELET
BASOS PCT: 0.5 % (ref 0.0–3.0)
Basophils Absolute: 0 10*3/uL (ref 0.0–0.1)
EOS ABS: 0.8 10*3/uL — AB (ref 0.0–0.7)
EOS PCT: 12.3 % — AB (ref 0.0–5.0)
HCT: 46.3 % (ref 39.0–52.0)
HEMOGLOBIN: 15.4 g/dL (ref 13.0–17.0)
LYMPHS ABS: 1.5 10*3/uL (ref 0.7–4.0)
Lymphocytes Relative: 21.9 % (ref 12.0–46.0)
MCHC: 33.2 g/dL (ref 30.0–36.0)
MCV: 86.3 fl (ref 78.0–100.0)
MONO ABS: 0.6 10*3/uL (ref 0.1–1.0)
Monocytes Relative: 9.6 % (ref 3.0–12.0)
NEUTROS ABS: 3.7 10*3/uL (ref 1.4–7.7)
Neutrophils Relative %: 55.7 % (ref 43.0–77.0)
PLATELETS: 297 10*3/uL (ref 150.0–400.0)
RBC: 5.37 Mil/uL (ref 4.22–5.81)
RDW: 14.1 % (ref 11.5–15.5)
WBC: 6.6 10*3/uL (ref 4.0–10.5)

## 2015-10-23 LAB — POC URINALSYSI DIPSTICK (AUTOMATED)
Bilirubin, UA: NEGATIVE
GLUCOSE UA: NEGATIVE
Ketones, UA: NEGATIVE
Leukocytes, UA: NEGATIVE
Nitrite, UA: NEGATIVE
Protein, UA: NEGATIVE
RBC UA: NEGATIVE
SPEC GRAV UA: 1.025
UROBILINOGEN UA: 0.2
pH, UA: 5.5

## 2015-10-23 LAB — LIPID PANEL
CHOL/HDL RATIO: 5
CHOLESTEROL: 150 mg/dL (ref 0–200)
HDL: 32.2 mg/dL — ABNORMAL LOW (ref >39.00)
LDL CALC: 98 mg/dL (ref 0–99)
NonHDL: 117.67
Triglycerides: 98 mg/dL (ref 0.0–149.0)
VLDL: 19.6 mg/dL (ref 0.0–40.0)

## 2015-10-23 LAB — BASIC METABOLIC PANEL
BUN: 19 mg/dL (ref 6–23)
CO2: 25 meq/L (ref 19–32)
CREATININE: 0.91 mg/dL (ref 0.40–1.50)
Calcium: 9.1 mg/dL (ref 8.4–10.5)
Chloride: 108 mEq/L (ref 96–112)
GFR: 87.98 mL/min (ref >60.00)
GLUCOSE: 100 mg/dL — AB (ref 70–99)
POTASSIUM: 4.4 meq/L (ref 3.5–5.1)
Sodium: 139 mEq/L (ref 135–145)

## 2015-10-23 LAB — TSH: TSH: 3.61 u[IU]/mL (ref 0.35–4.50)

## 2015-10-23 LAB — PSA: PSA: 0 ng/mL — ABNORMAL LOW (ref 0.10–4.00)

## 2015-10-30 ENCOUNTER — Telehealth: Payer: Self-pay | Admitting: Family Medicine

## 2015-10-30 ENCOUNTER — Encounter: Payer: Self-pay | Admitting: Family Medicine

## 2015-10-30 ENCOUNTER — Ambulatory Visit: Payer: Medicare Other | Admitting: Family Medicine

## 2015-10-30 ENCOUNTER — Ambulatory Visit (INDEPENDENT_AMBULATORY_CARE_PROVIDER_SITE_OTHER): Payer: Commercial Managed Care - HMO | Admitting: Family Medicine

## 2015-10-30 VITALS — BP 126/78 | HR 67 | Temp 97.9°F | Ht 72.0 in | Wt 249.0 lb

## 2015-10-30 DIAGNOSIS — Z0000 Encounter for general adult medical examination without abnormal findings: Secondary | ICD-10-CM | POA: Diagnosis not present

## 2015-10-30 NOTE — Telephone Encounter (Signed)
Pt would like to have a ongoing referral to Dr. Risa Grill at [facility] and at the present time he has a appt on 12/03/15  9:30 am. Pt stated that he will be going twice a year and it may be more.

## 2015-10-30 NOTE — Patient Instructions (Addendum)
Overall you are doing well.   Sugar levels are looking better.   If you get any shortness of breath, want you to seek care immediately with your pulmonary embolism history.   No changes to medication

## 2015-10-30 NOTE — Progress Notes (Signed)
Phone: [phone]  Subjective:  Patient presents today for their annual physical. Chief complaint-noted.   See problem oriented charting- ROS- full  review of systems was completed and negative including No chest pain or shortness of breath. No headache or blurry vision. No dizziness  The following were reviewed and entered/updated in epic: Past Medical History  Diagnosis Date  . Allergy   . Cancer ([place])     basal cell CA (skin)  . GERD (gastroesophageal reflux disease)   . BPH (benign prostatic hyperplasia)   . Hyperlipidemia   . Asbestos exposure   . Diverticulosis   . Colitis 01/2011  . Liver cyst 10/11    4 simple cysts in liver  . Prostate cancer ([place]) 10/06/11    Gleason 3+4=7, vol 39 cc, PSA 5.15  . Basal cell carcinoma     skin of left ear  . Kidney stones   . Lipoma 12-24-11    multiple(present- lipoma 2-lt./4- rt arms/ back/ chest  . Prostate cancer ([place]) 12-24-11    dx. Prostate cancer after bx.  . Deviated nasal septum     hx. of  . DVT (deep venous thrombosis) ([place])   . Ulcerative colitis ([place])   . Colon polyp    Patient Active Problem List   Diagnosis Date Noted  . 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 History of DVT of lower extremity 08/31/2012    Priority: High  . History of pulmonary embolism 04/13/2012    Priority: High  . History of prostate cancer 05/14/2011    Priority: High  . Urinary incontinence 09/20/2014    Priority: Medium  . Diverticulosis 03/22/2014    Priority: Medium  . Hyperglycemia 05/12/2012    Priority: Medium  . GERD 10/04/2007    Priority: Medium  . Hyperlipidemia 03/08/2007    Priority: Medium  . Erectile dysfunction 03/22/2014    Priority: Low  . Personal history of colonic polyps 08/31/2012    Priority: Low  . Basal cell carcinoma     Priority: Low  . Obesity 10/08/2011    Priority: Low  . 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 Kidney stone 06/02/2010   Priority: Low  . Allergic rhinitis 10/04/2007    Priority: Low   Past Surgical History  Procedure Laterality Date  . Colonoscopy    . Polypectomy    . Knee surgery  2007    right knee scope  . Tonsillectomy    . Lipoma removals  1990's    multiple and multiple remains now  . Robot assisted laparoscopic radical prostatectomy  12/31/2011    Procedure: ROBOTIC ASSISTED LAPAROSCOPIC RADICAL PROSTATECTOMY;  Surgeon: Bernestine Amass, MD;  Location: [facility];  Service: Urology;  Laterality: N/A;      . Lymph node dissection  12/31/2011    Procedure: LYMPH NODE DISSECTION;  Surgeon: Bernestine Amass, MD;  Location: [facility];  Service: Urology;  Laterality: Bilateral;  Bilateral  . Inguinal hernia repair  1990's    double  . Cystoscopy  02/16/2012    Procedure: CYSTOSCOPY FLEXIBLE;  Surgeon: Bernestine Amass, MD;  Location: [facility];  Service: Urology;  Laterality: N/A;  FLEX CYSTO WITH REMOVAL OF STAPLES     Family History  Problem Relation Age of Onset  . Colon cancer Mother 28  . Colon cancer Father 69  . Esophageal cancer Neg Hx   . Stomach cancer Neg Hx   . Prostate  cancer Paternal Grandfather     Medications- reviewed and updated Current Outpatient Prescriptions  Medication Sig Dispense Refill  . aspirin 325 MG tablet Take 325 mg by mouth daily.    Marland Kitchen co-enzyme Q-10 50 MG capsule Take 200 mg by mouth daily.    Marland Kitchen loratadine (CLARITIN) 10 MG tablet Take 10 mg by mouth daily.    . Multiple Vitamins-Minerals (MULTIVITAMIN WITH MINERALS) tablet Take 1 tablet by mouth daily.    Marland Kitchen omeprazole (PRILOSEC) 20 MG capsule Take 1 capsule (20 mg total) by mouth daily. 90 capsule 3  . pravastatin (PRAVACHOL) 20 MG tablet Take 1 tablet (20 mg total) by mouth daily. 90 tablet 3   No current facility-administered medications for this visit.    Allergies-reviewed and updated Allergies  Allergen Reactions  . Nsaids     colitis    Social History   Social History  . Marital  Status: Married    Spouse Name: N/A  . Number of Children: N/A  . Years of Education: N/A   Occupational History  . Retired    Social History Main Topics  . Smoking status: Never Smoker   . Smokeless tobacco: Never Used     Comment: never used tobacco  . Alcohol Use: 0.0 - 0.6 oz/week    0-1 Standard drinks or equivalent per week  . Drug Use: No  . Sexual Activity: Yes   Other Topics Concern  . 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       Hobbies: travels fair amount has timeshare and also travels more than that, enjoys Haematologist, home repair      Physician roster   Delfin Edis - gastroenterology in past   Burney Gauze - hematology (released around 2016)   Dr. Risa Grill - urology   Dr. Herbert Deaner - Ophthalmology   Dr. Nevada Crane- dermatology    Objective: BP 126/78 mmHg  Pulse 67  Temp(Src) 97.9 F (36.6 C) (Oral)  Ht 6' (1.829 m)  Wt 249 lb (112.946 kg)  BMI 33.76 kg/m2  SpO2 89% Gen: NAD, resting comfortably HEENT: Mucous membranes are moist. Oropharynx normal Neck: no thyromegaly CV: RRR no murmurs rubs or gallops Lungs: CTAB no crackles, wheeze, rhonchi Abdomen: soft/nontender/nondistended/normal bowel sounds. No rebound or guarding.  Ext: no edema Skin: warm, dry Neuro: grossly normal, moves all extremities, PERRLA  No rectal as prostatectomy  Assessment/Plan:  70 y.o. male presenting for annual physical.  Health Maintenance counseling: 1. Anticipatory guidance: Patient counseled regarding regular dental exams (advised more regular), eye exams, wearing seatbelts.  2. Risk factor reduction:  Advised patient of need for regular exercise (3-4 days a week) and diet rich and fruits and vegetables to reduce risk of heart attack and stroke. Cut out sodas and sweet teas- down 5 lbs.  3. Immunizations/screenings/ancillary studies Immunization History  Administered Date(s)  Administered  . Influenza Split 03/19/2011, 03/26/2012  . Influenza Whole 03/15/2008, 02/22/2009, 02/25/2010  . Influenza, High Dose Seasonal PF 02/28/2015  . Influenza,inj,Quad PF,36+ Mos 03/18/2013, 03/07/2014  . Pneumococcal Conjugate-13 04/30/2015  . Pneumococcal Polysaccharide-23 09/12/2013  . Td 11/01/2003, 09/12/2013  . Zoster 05/14/2011   Health Maintenance Due  Topic Date Due  . Hepatitis C Screening - next year 1945/12/08   4. Prostate cancer screening- history of cancer, now psa remains after 0 after prostattectomy  Lab Results  Component Value Date   PSA 0.00* 10/23/2015   PSA  0.00 Repeated and verified X2.* 09/05/2013   PSA 4.79* 04/25/2011   5. Colon cancer screening - adenoma 12/2012 with 5 year follow up.  6. Skin cancer screening- history basal cell year, dermatology Dr. Nevada Crane  Status of chronic or acute concerns   History prostate cancer- PSA remains at 0 with prostatectomy 2013  History of DVT/PE 2013 due to history of lupus anticoagulant- on coumadin for 2 years then back to aspirin 325 alone per Dr. Marin Olp for [facility]. sats noted to not be elevated today- no distress, will monitor.   GERD- takes prilosec and controlled. Takes every other day- if takes it less than this he gets recurrence.   Hyperglycemia- sugar 100- improved, consider a1c at follow up, cold [place] on mountain de[place]. Lost 5 lbs since last visit.  Lab Results  Component Value Date   HGBA1C 6.0 09/20/2014   Hyperlipidemia- on pravastatin with LDL 98- continue current rx  Return in about 1 year (around 10/29/2016) for physical. check in 6 months if you would like to keep an eye on weight.. Return precautions advised.   Meds ordered this encounter  Medications  . Multiple Vitamins-Minerals (MULTIVITAMIN WITH MINERALS) tablet    Sig: Take 1 tablet by mouth daily.  Marland Kitchen co-enzyme Q-10 50 MG capsule    Sig: Take 200 mg by mouth daily.  Marland Kitchen loratadine (CLARITIN) 10 MG tablet    Sig: Take 10 mg by mouth  daily.    [name], MD

## 2015-10-30 NOTE — Telephone Encounter (Signed)
Yes thanks, please authorize for twice a year if possible

## 2015-10-31 NOTE — Telephone Encounter (Signed)
Done-Outpatient Authorization [id number] 12/03/2015 - 05/31/2016

## 2015-12-03 DIAGNOSIS — C61 Malignant neoplasm of prostate: Secondary | ICD-10-CM | POA: Diagnosis not present

## 2015-12-03 DIAGNOSIS — N393 Stress incontinence (female) (male): Secondary | ICD-10-CM | POA: Diagnosis not present

## 2015-12-20 ENCOUNTER — Encounter: Payer: Self-pay | Admitting: Gastroenterology

## 2016-04-09 ENCOUNTER — Ambulatory Visit (INDEPENDENT_AMBULATORY_CARE_PROVIDER_SITE_OTHER): Payer: Commercial Managed Care - HMO

## 2016-04-09 DIAGNOSIS — Z23 Encounter for immunization: Secondary | ICD-10-CM

## 2016-04-29 ENCOUNTER — Encounter: Payer: Self-pay | Admitting: Family Medicine

## 2016-04-29 ENCOUNTER — Ambulatory Visit (INDEPENDENT_AMBULATORY_CARE_PROVIDER_SITE_OTHER): Payer: Commercial Managed Care - HMO | Admitting: Family Medicine

## 2016-04-29 DIAGNOSIS — R030 Elevated blood-pressure reading, without diagnosis of hypertension: Secondary | ICD-10-CM | POA: Insufficient documentation

## 2016-04-29 NOTE — Progress Notes (Signed)
Subjective:  Nicholas Owen is a 70 y.o. year old very pleasant male patient who presents for/with See problem oriented charting ROS- asymptomatic. No chest pain or shortness of breath. No headache or blurry vision. No lightheadedness.   Past Medical History-  Patient Active Problem List   Diagnosis Date Noted  . 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 History of DVT of lower extremity 08/31/2012    Priority: High  . History of pulmonary embolism 04/13/2012    Priority: High  . History of prostate cancer 05/14/2011    Priority: High  . Urinary incontinence 09/20/2014    Priority: Medium  . Diverticulosis 03/22/2014    Priority: Medium  . Hyperglycemia 05/12/2012    Priority: Medium  . GERD 10/04/2007    Priority: Medium  . Hyperlipidemia 03/08/2007    Priority: Medium  . Erectile dysfunction 03/22/2014    Priority: Low  . Personal history of colonic polyps 08/31/2012    Priority: Low  . Basal cell carcinoma     Priority: Low  . Obesity 10/08/2011    Priority: Low  . 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 Kidney stone 06/02/2010    Priority: Low  . Allergic rhinitis 10/04/2007    Priority: Low  . Elevated blood pressure reading 04/29/2016    Medications- reviewed and updated Current Outpatient Prescriptions  Medication Sig Dispense Refill  . aspirin 325 MG tablet Take 325 mg by mouth daily.    Marland Kitchen co-enzyme Q-10 50 MG capsule Take 200 mg by mouth daily.    . Multiple Vitamins-Minerals (MULTIVITAMIN WITH MINERALS) tablet Take 1 tablet by mouth daily.    Marland Kitchen omeprazole (PRILOSEC) 20 MG capsule Take 1 capsule (20 mg total) by mouth daily. 90 capsule 3  . pravastatin (PRAVACHOL) 20 MG tablet Take 1 tablet (20 mg total) by mouth daily. 90 tablet 3   No current facility-administered medications for this visit.     Objective: BP 118/84 (BP Location: Left Arm, Patient Position: Sitting, Cuff Size: Large)   Pulse 73    Temp 98.1 F (36.7 C) (Oral)   Ht 6' (1.829 m)   Wt 247 lb 3.2 oz (112.1 kg)   SpO2 96%   BMI 33.53 kg/m  Gen: NAD, resting comfortably, anxious appearing when discussing BP CV: RRR no murmurs rubs or gallops Lungs: CTAB no crackles, wheeze, rhonchi Ext: no edema Skin: warm, dry  Assessment/Plan:  Elevated blood pressure reading S: dental work 165/103 last thursday. Yesterday they got 165/93. 3 separate readings. Repeat got down to 155 and went ahead and did the filling. About to go to [place] for a week.  BP Readings from Last 3 Encounters:  04/29/16 118/84  10/30/15 126/78  04/30/15 120/82  A/P: on small cuff I did get 154/94 initially with high 130 low 140s on repeat with diastolic 88 but large cuff fits him better and my reading and Jamies both as above 118/84. Discussed diastolic goal 0000000 and pushing for this with lifestyle changes but would not start medication. Wonder if cuff at dentist was too thigh. Balanced 130/80 recommendations with JNC8 guidelines- for now to monitor.   [place] [place] compression stocking, up every hour, pump calves.  The duration of face-to-face time during this visit was greater than 15 minutes. Greater than 50% of this time was spent in counseling on new bp guidelines, safety measures for trip given his clotting history  Return precautions advised.  [name], MD

## 2016-04-29 NOTE — Patient Instructions (Signed)
Blood pressure looks great today- not quite to the new guidelines but much better than in dental office. You likely need a large cuff each check.   Have a great trip!

## 2016-04-29 NOTE — Assessment & Plan Note (Addendum)
S: dental work 165/103 last thursday. Yesterday they got 165/93. 3 separate readings. Repeat got down to 155 and went ahead and did the filling. About to go to [place] for a week.  BP Readings from Last 3 Encounters:  04/29/16 118/84  10/30/15 126/78  04/30/15 120/82  A/P: on small cuff I did get 154/94 initially with high 130 low 140s on repeat with diastolic 88 but large cuff fits him better and my reading and Jamies both as above 118/84. Discussed diastolic goal 0000000 and pushing for this with lifestyle changes but would not start medication. Wonder if cuff at dentist was too thigh. Balanced 130/80 recommendations with JNC8 guidelines- for now to monitor.

## 2016-04-29 NOTE — Progress Notes (Signed)
Pre visit review using our clinic review tool, if applicable. No additional management support is needed unless otherwise documented below in the visit note. 

## 2016-06-09 DIAGNOSIS — R69 Illness, unspecified: Secondary | ICD-10-CM | POA: Diagnosis not present

## 2016-09-30 DIAGNOSIS — C61 Malignant neoplasm of prostate: Secondary | ICD-10-CM | POA: Diagnosis not present

## 2016-10-08 DIAGNOSIS — Z8546 Personal history of malignant neoplasm of prostate: Secondary | ICD-10-CM | POA: Diagnosis not present

## 2016-10-08 DIAGNOSIS — N5201 Erectile dysfunction due to arterial insufficiency: Secondary | ICD-10-CM | POA: Diagnosis not present

## 2016-10-08 DIAGNOSIS — N393 Stress incontinence (female) (male): Secondary | ICD-10-CM | POA: Diagnosis not present

## 2016-10-30 ENCOUNTER — Other Ambulatory Visit: Payer: Self-pay | Admitting: Family Medicine

## 2016-11-04 ENCOUNTER — Telehealth: Payer: Self-pay | Admitting: Family Medicine

## 2016-11-04 ENCOUNTER — Ambulatory Visit (INDEPENDENT_AMBULATORY_CARE_PROVIDER_SITE_OTHER): Payer: Medicare HMO | Admitting: Family Medicine

## 2016-11-04 ENCOUNTER — Encounter: Payer: Self-pay | Admitting: Family Medicine

## 2016-11-04 ENCOUNTER — Other Ambulatory Visit: Payer: Self-pay | Admitting: Family Medicine

## 2016-11-04 VITALS — BP 132/88 | HR 68 | Temp 98.5°F | Ht 71.0 in | Wt 252.2 lb

## 2016-11-04 DIAGNOSIS — Z7289 Other problems related to lifestyle: Secondary | ICD-10-CM | POA: Diagnosis not present

## 2016-11-04 DIAGNOSIS — R739 Hyperglycemia, unspecified: Secondary | ICD-10-CM

## 2016-11-04 DIAGNOSIS — Z0000 Encounter for general adult medical examination without abnormal findings: Secondary | ICD-10-CM

## 2016-11-04 DIAGNOSIS — D6862 Lupus anticoagulant syndrome: Secondary | ICD-10-CM

## 2016-11-04 DIAGNOSIS — K219 Gastro-esophageal reflux disease without esophagitis: Secondary | ICD-10-CM

## 2016-11-04 DIAGNOSIS — E785 Hyperlipidemia, unspecified: Secondary | ICD-10-CM | POA: Diagnosis not present

## 2016-11-04 LAB — COMPREHENSIVE METABOLIC PANEL
ALT: 20 U/L (ref 0–53)
AST: 18 U/L (ref 0–37)
Albumin: 4.4 g/dL (ref 3.5–5.2)
Alkaline Phosphatase: 72 U/L (ref 39–117)
BUN: 18 mg/dL (ref 6–23)
CALCIUM: 9.2 mg/dL (ref 8.4–10.5)
CO2: 23 meq/L (ref 19–32)
Chloride: 106 mEq/L (ref 96–112)
Creatinine, Ser: 0.96 mg/dL (ref 0.40–1.50)
GFR: 82.46 mL/min (ref >60.00)
Glucose, Bld: 104 mg/dL — ABNORMAL HIGH (ref 70–99)
Potassium: 4.5 mEq/L (ref 3.5–5.1)
Sodium: 139 mEq/L (ref 135–145)
Total Bilirubin: 1.3 mg/dL — ABNORMAL HIGH (ref 0.2–1.2)
Total Protein: 6.4 g/dL (ref 6.0–8.3)

## 2016-11-04 LAB — CBC
HCT: 45.8 % (ref 39.0–52.0)
Hemoglobin: 15.8 g/dL (ref 13.0–17.0)
MCHC: 34.5 g/dL (ref 30.0–36.0)
MCV: 85.2 fl (ref 78.0–100.0)
PLATELETS: 308 10*3/uL (ref 150.0–400.0)
RBC: 5.38 Mil/uL (ref 4.22–5.81)
RDW: 14.5 % (ref 11.5–15.5)
WBC: 5.6 10*3/uL (ref 4.0–10.5)

## 2016-11-04 LAB — LIPID PANEL
CHOL/HDL RATIO: 4
Cholesterol: 157 mg/dL (ref 0–200)
HDL: 39 mg/dL — ABNORMAL LOW (ref >39.00)
LDL Cholesterol: 96 mg/dL (ref 0–99)
NONHDL: 117.98
TRIGLYCERIDES: 109 mg/dL (ref 0.0–149.0)
VLDL: 21.8 mg/dL (ref 0.0–40.0)

## 2016-11-04 LAB — HEMOGLOBIN A1C: HEMOGLOBIN A1C: 6.1 % (ref 4.6–6.5)

## 2016-11-04 NOTE — Telephone Encounter (Signed)
We need to sched appt w/ Dr. Yong Channel at [facility] on December 5th 2018. He is seeing Nicholas Owen on that day at [facility] for AWV.  Thank you,  -LL

## 2016-11-04 NOTE — Assessment & Plan Note (Signed)
Fasting this Am, will get a1c and cbg. Lab Results  Component Value Date   HGBA1C 6.0 09/20/2014

## 2016-11-04 NOTE — Progress Notes (Addendum)
Phone: [phone]  Subjective:  Patient presents today for their annual physical. Chief complaint-noted.   See problem oriented charting- ROS- full  review of systems was completed and negative except for: episode of chest pain lasting 3 seconds 2 weeks ago- see notes below  The following were reviewed and entered/updated in epic: Past Medical History:  Diagnosis Date  . Allergy   . Asbestos exposure   . Basal cell carcinoma    skin of left ear  . BPH (benign prostatic hyperplasia)   . Cancer ([place])    basal cell CA (skin)  . Colitis 01/2011  . Colon polyp   . Deviated nasal septum    hx. of  . Diverticulosis   . DVT (deep venous thrombosis) ([place])   . GERD (gastroesophageal reflux disease)   . Hyperlipidemia   . Kidney stones   . Lipoma 12-24-11   multiple(present- lipoma 2-lt./4- rt arms/ back/ chest  . Liver cyst 10/11   4 simple cysts in liver  . 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 Prostate cancer ([place]) 12-24-11   dx. Prostate cancer after bx.  . Ulcerative colitis [facility])    Patient Active Problem List   Diagnosis Date Noted  . Lupus anticoagulant disorder ([place]) 04/30/2015    Priority: High  . History of DVT of lower extremity 08/31/2012    Priority: High  . History of pulmonary embolism 04/13/2012    Priority: High  . History of prostate cancer 05/14/2011    Priority: High  . Urinary incontinence 09/20/2014    Priority: Medium  . Diverticulosis 03/22/2014    Priority: Medium  . Hyperglycemia 05/12/2012    Priority: Medium  . GERD 10/04/2007    Priority: Medium  . Hyperlipidemia 03/08/2007    Priority: Medium  . Erectile dysfunction 03/22/2014    Priority: Low  . Personal history of colonic polyps 08/31/2012    Priority: Low  . Basal cell carcinoma     Priority: Low  . Obesity 10/08/2011    Priority: Low  . 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 Kidney stone 06/02/2010    Priority: Low  .  Allergic rhinitis 10/04/2007    Priority: Low  . Elevated blood pressure reading 04/29/2016   Past Surgical History:  Procedure Laterality Date  . COLONOSCOPY    . CYSTOSCOPY  02/16/2012   Procedure: CYSTOSCOPY FLEXIBLE;  Surgeon: Bernestine Amass, MD;  Location: [facility];  Service: Urology;  Laterality: N/A;  FLEX CYSTO WITH REMOVAL OF STAPLES   . INGUINAL HERNIA REPAIR  1990's   double  . KNEE SURGERY  2007   right knee scope  . lipoma removals  1990's   multiple and multiple remains now  . LYMPH NODE DISSECTION  12/31/2011   Procedure: LYMPH NODE DISSECTION;  Surgeon: Bernestine Amass, MD;  Location: [facility];  Service: Urology;  Laterality: Bilateral;  Bilateral  . POLYPECTOMY    . ROBOT ASSISTED LAPAROSCOPIC RADICAL PROSTATECTOMY  12/31/2011   Procedure: ROBOTIC ASSISTED LAPAROSCOPIC RADICAL PROSTATECTOMY;  Surgeon: Bernestine Amass, MD;  Location: [facility];  Service: Urology;  Laterality: N/A;      . TONSILLECTOMY      Family History  Problem Relation Age of Onset  . Colon cancer Mother 63  . Colon cancer Father 38  . Esophageal cancer Neg Hx   . Stomach cancer Neg Hx   . Prostate cancer Paternal Grandfather  Medications- reviewed and updated Current Outpatient Prescriptions  Medication Sig Dispense Refill  . aspirin 325 MG tablet Take 325 mg by mouth daily.    Marland Kitchen co-enzyme Q-10 50 MG capsule Take 200 mg by mouth daily.    . Multiple Vitamins-Minerals (MULTIVITAMIN WITH MINERALS) tablet Take 1 tablet by mouth daily.    Marland Kitchen omeprazole (PRILOSEC) 20 MG capsule Take 1 capsule (20 mg total) by mouth daily. 90 capsule 3  . pravastatin (PRAVACHOL) 20 MG tablet TAKE 1 TABLET EVERY DAY 90 tablet 3   No current facility-administered medications for this visit.     Allergies-reviewed and updated Allergies  Allergen Reactions  . Nsaids     colitis    Social History   Social History  . Marital status: Married    Spouse name: N/A  . Number of children: N/A  .  Years of education: N/A   Occupational History  . Retired Retired   Social History Main Topics  . Smoking status: Never Smoker  . Smokeless tobacco: Never Used     Comment: never used tobacco  . Alcohol use 0.0 - 0.6 oz/week  . Drug use: No  . Sexual activity: Yes   Other Topics Concern  . 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       Hobbies: travels fair amount has timeshare and also travels more than that, enjoys Haematologist, home repair      Physician roster   Delfin Edis - gastroenterology in past   Burney Gauze - hematology (released around 2016)   Dr. Risa Grill - urology   Dr. Herbert Deaner - Ophthalmology   Dr. [place] Crane- dermatology    Objective: BP 132/88 (BP Location: Left Arm, Patient Position: Sitting, Cuff Size: Large)   Pulse 68   Temp 98.5 F (36.9 C) (Oral)   Ht 5\' 11"  (1.803 m)   Wt 252 lb 3.2 oz (114.4 kg)   SpO2 97%   BMI 35.17 kg/m  Gen: NAD, resting comfortably HEENT: Mucous membranes are moist. Oropharynx normal Neck: no thyromegaly CV: RRR no murmurs rubs or gallops Lungs: CTAB no crackles, wheeze, rhonchi Abdomen: soft/nontender/nondistended/normal bowel sounds. No rebound or guarding.  Ext: trace edema Skin: warm, dry Neuro: grossly normal, moves all extremities, PERRLA Prostatectomy so no rectal exam completed  Assessment/Plan:  71 y.o. male presenting for annual physical.  Health Maintenance counseling: 1. Anticipatory guidance: Patient counseled regarding regular dental exams - q 6 month visits, eye exams -is going to schedule a follow up - last 2 years ago, wearing seatbelts.  2. Risk factor reduction:  Advised patient of need for regular exercise and diet rich and fruits and vegetables to reduce risk of heart attack and stroke. Exercise- last year was doing 3-4 days a week. This year - down to 2-3 a week . Diet-last year was down 5 lbs after cutting  out/down sodas and sweets. This year weight has sprung up 5 lbs again. States he needs to cut down on portions as a "food lover". Big on burgers- wants to cut down.   R knee keeps him from walking more.  Wt Readings from Last 3 Encounters:  11/04/16 252 lb 3.2 oz (114.4 kg)  04/29/16 247 lb 3.2 oz (112.1 kg)  10/30/15 249 lb (112.9 kg)  3. Immunizations/screenings/ancillary studies- offered shingrix at pharmacy- declined until next year. Offered HCV testing- opts in Immunization History  Administered Date(s) Administered  .  Influenza Split 03/19/2011, 03/26/2012  . Influenza Whole 03/15/2008, 02/22/2009, 02/25/2010  . Influenza, High Dose Seasonal PF 02/28/2015, 04/09/2016  . Influenza,inj,Quad PF,36+ Mos 03/18/2013, 03/07/2014  . Pneumococcal Conjugate-13 04/30/2015  . Pneumococcal Polysaccharide-23 09/12/2013  . Td 11/01/2003, 09/12/2013  . Zoster 05/14/2011  4. Prostate cancer- History of prostate cancer. PSA has been 0 after prostatectomy - had this 3 weeks ago. Will be 5 years out In 2 months.   Lab Results  Component Value Date   PSA 0.00 (L) 10/23/2015   PSA 0.00 Repeated and verified X2. (L) 09/05/2013   PSA 4.79 (H) 04/25/2011   5. Colon cancer screening - 12/2012 with 5 year follow up 6. Skin cancer screening- sees Dr. Nevada Crane of dermatology yearly with basal cell skin cancer history. Mainly as needed  Status of chronic or acute concerns   See prostate cancer notes above.   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 Was on coumadin for 2 years through November 2015 then transitioned to aspirin 325mg  for life per Dr. Marin Olp.   [place] of upper abdominal pain for 3 seconds at a wedding. Had taken prilosec that morning. No episodes since that time. Felt like a straing- had lifted heavy at [facility]- much heavier than normal. Not exertional. No shortness of breath, no left arm or neck pain. Over 2 weeks ago with no recurrence.  - we opted to  monitor only . Strongly doubt PE given short term and no recurrence  Hyperlipidemia Hyperlipidemia- compliant with pravastatin- update lipids  GERD GERD- controlled on prilosec every day- seems to have worsened with weight gain. Advised weight loss  Hyperglycemia Fasting this Am, will get a1c and cbg. Lab Results  Component Value Date   HGBA1C 6.0 09/20/2014   6 months awv [facility], 1 year CPE me  Orders Placed This Encounter  Procedures  . Hepatitis C antibody  . Hemoglobin A1c    [place]  . CBC    [place]  . Comprehensive metabolic panel    [place]    Order Specific Question:   Has the patient fasted?    Answer:   No  . Lipid panel    [place]    Order Specific Question:   Has the patient fasted?    Answer:   No   Return precautions advised.  [name], MD

## 2016-11-04 NOTE — Assessment & Plan Note (Signed)
Hyperlipidemia- compliant with pravastatin- update lipids

## 2016-11-04 NOTE — Assessment & Plan Note (Signed)
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 Was on coumadin for 2 years through November 2015 then transitioned to aspirin 325mg  for life per Dr. Marin Olp.   Twinge of upper abdominal pain for 3 seconds at a wedding. Had taken prilosec that morning. No episodes since that time. Felt like a straing- had lifted heavy at [facility]- much heavier than normal. Not exertional. No shortness of breath, no left arm or neck pain. Over 2 weeks ago with no recurrence.  - we opted to monitor only . Strongly doubt PE given short term and no recurrence

## 2016-11-04 NOTE — Assessment & Plan Note (Signed)
GERD- controlled on prilosec every day- seems to have worsened with weight gain. Advised weight loss

## 2016-11-04 NOTE — Patient Instructions (Addendum)
Please stop by lab before you go  I want you to see Nicholas Owen for a wellness visit at [facility] in 6 months- schedulers should be able to set this up for you. I want you to set a goal of 10-15 lbs down in this time frame.  - increase exercise to 4 days a week - focus on 3-5 servings of veggies a day - reduce portion size - consider weight watchers if not making progress  No other changes today

## 2016-11-05 LAB — HEPATITIS C ANTIBODY: HCV Ab: NEGATIVE

## 2016-12-17 ENCOUNTER — Ambulatory Visit (INDEPENDENT_AMBULATORY_CARE_PROVIDER_SITE_OTHER): Payer: Medicare HMO | Admitting: Family Medicine

## 2016-12-17 ENCOUNTER — Encounter: Payer: Self-pay | Admitting: Family Medicine

## 2016-12-17 VITALS — BP 136/86 | HR 66 | Temp 98.2°F | Ht 71.0 in | Wt 240.8 lb

## 2016-12-17 DIAGNOSIS — R109 Unspecified abdominal pain: Secondary | ICD-10-CM

## 2016-12-17 LAB — COMPREHENSIVE METABOLIC PANEL
ALK PHOS: 70 U/L (ref 39–117)
ALT: 18 U/L (ref 0–53)
AST: 16 U/L (ref 0–37)
Albumin: 4.5 g/dL (ref 3.5–5.2)
BILIRUBIN TOTAL: 1.4 mg/dL — AB (ref 0.2–1.2)
BUN: 16 mg/dL (ref 6–23)
CO2: 24 mEq/L (ref 19–32)
CREATININE: 0.96 mg/dL (ref 0.40–1.50)
Calcium: 9.7 mg/dL (ref 8.4–10.5)
Chloride: 106 mEq/L (ref 96–112)
GFR: 82.43 mL/min (ref >60.00)
GLUCOSE: 113 mg/dL — AB (ref 70–99)
Potassium: 4.4 mEq/L (ref 3.5–5.1)
SODIUM: 138 meq/L (ref 135–145)
TOTAL PROTEIN: 6.5 g/dL (ref 6.0–8.3)

## 2016-12-17 LAB — CBC WITH DIFFERENTIAL/PLATELET
BASOS ABS: 0 10*3/uL (ref 0.0–0.1)
Basophils Relative: 0.7 % (ref 0.0–3.0)
Eosinophils Absolute: 0.4 10*3/uL (ref 0.0–0.7)
Eosinophils Relative: 8.6 % — ABNORMAL HIGH (ref 0.0–5.0)
HCT: 47.5 % (ref 39.0–52.0)
HEMOGLOBIN: 16.1 g/dL (ref 13.0–17.0)
LYMPHS ABS: 0.9 10*3/uL (ref 0.7–4.0)
Lymphocytes Relative: 19.9 % (ref 12.0–46.0)
MCHC: 33.9 g/dL (ref 30.0–36.0)
MCV: 87 fl (ref 78.0–100.0)
MONO ABS: 0.4 10*3/uL (ref 0.1–1.0)
MONOS PCT: 8.4 % (ref 3.0–12.0)
NEUTROS PCT: 62.4 % (ref 43.0–77.0)
Neutro Abs: 3 10*3/uL (ref 1.4–7.7)
Platelets: 289 10*3/uL (ref 150.0–400.0)
RBC: 5.45 Mil/uL (ref 4.22–5.81)
RDW: 14.2 % (ref 11.5–15.5)
WBC: 4.7 10*3/uL (ref 4.0–10.5)

## 2016-12-17 LAB — LIPASE: Lipase: 30 U/L (ref 11.0–59.0)

## 2016-12-17 NOTE — Progress Notes (Addendum)
Subjective:  Nicholas Owen is a 71 y.o. year old very pleasant male patient who presents for/with See problem oriented charting ROS-no fevers, chills, fatigue/malaise, nausea/vomiting, night sweats, or recent weight change  Past Medical History-  Patient Active Problem List   Diagnosis Date Noted  . 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 History of DVT of lower extremity 08/31/2012    Priority: High  . History of pulmonary embolism 04/13/2012    Priority: High  . History of prostate cancer 05/14/2011    Priority: High  . Urinary incontinence 09/20/2014    Priority: Medium  . Diverticulosis 03/22/2014    Priority: Medium  . Hyperglycemia 05/12/2012    Priority: Medium  . GERD 10/04/2007    Priority: Medium  . Hyperlipidemia 03/08/2007    Priority: Medium  . Erectile dysfunction 03/22/2014    Priority: Low  . Personal history of colonic polyps 08/31/2012    Priority: Low  . Basal cell carcinoma     Priority: Low  . Obesity 10/08/2011    Priority: Low  . 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 Kidney stone 06/02/2010    Priority: Low  . Allergic rhinitis 10/04/2007    Priority: Low  . Elevated blood pressure reading 04/29/2016    Medications- reviewed and updated Current Outpatient Prescriptions  Medication Sig Dispense Refill  . aspirin 325 MG tablet Take 325 mg by mouth daily.    Marland Kitchen co-enzyme Q-10 50 MG capsule Take 200 mg by mouth daily.    . Multiple Vitamins-Minerals (MULTIVITAMIN WITH MINERALS) tablet Take 1 tablet by mouth daily.    Marland Kitchen omeprazole (PRILOSEC) 20 MG capsule TAKE 1 CAPSULE EVERY DAY 90 capsule 3  . pravastatin (PRAVACHOL) 20 MG tablet TAKE 1 TABLET EVERY DAY 90 tablet 3   No current facility-administered medications for this visit.     Objective: BP 136/86 (BP Location: Left Arm, Patient Position: Sitting, Cuff Size: Large)   Pulse 66   Temp 98.2 F (36.8 C) (Oral)   Ht 5\' 11"  (1.803 m)    Wt 240 lb 12.8 oz (109.2 kg)   SpO2 95%   BMI 33.58 kg/m  Gen: NAD, resting comfortably CV: RRR no murmurs rubs or gallops Lungs: CTAB no crackles, wheeze, rhonchi Abdomen: soft/pain with palpation over medial edge of rectus abdominus muscles/nondistended/normal bowel sounds. No rebound or guarding. Obese. No obvious hernia. No hepatosplenomegaly. Ext: no edema Skin: warm, dry  Assessment/Plan:  Right sided abdominal pain - Plan: CBC with Differential/Platelet, Comprehensive metabolic panel, Lipase S: has had some right sided abdominal discomfort both RLQ and RUQ.Feels intermittently RLQ and RUQ throughout the day perhaps 0-1/10- very tolerable. Never gets more intense. Very slight strain like feeling. Wonders if he could have strained at the [facility] (does lift light). Dd lift rather heavy - more than double the weight before sharp shooting pain before wedding about a month ago then had sharp shooting epigastric pain next day. Nothing like that sense   Bowel movements daily- firm/normal- no increased firmness. No burning with peeing.   Worse with chest press or doing arm curls.   Has lost 12 lbs but has grossly cut down on food intake and is going to the [employer] 3-4 x a week.  A/P: I suspect this represents rectus abdominis strain. Patient was worried about potential cancer. With his pain along the rectus abdominis I thought this was less likely. Lipase also normal as well as CBC and  CMP largely. Bilirubin hair high and will monitor. Has had weight loss but has been very intentional about this. We discussed conservative care including heat 20 minutes 3 times a day. He should avoid exercises that exacerbate pain such as bench press or bicep curls. We discussed following up in 1 month if symptoms continue or sooner if symptoms worsen. He is in agreement plan.  Colonoscopy is up-to-date but will be updated again next year likely  Orders Placed This Encounter  Procedures  . CBC with  Differential/Platelet  . Comprehensive metabolic panel    [place]  . Lipase   Return precautions advised.  [name], MD

## 2016-12-17 NOTE — Patient Instructions (Addendum)
Please stop by lab before you go  This is acting like a muscular strain. I want you to try heat 20 minutes 3x a day for a week at least  Follow up within me in 1 month if symptoms continue or sooner if symptoms worsen. Could consider imaging but would not start there with low level pain.

## 2017-01-15 ENCOUNTER — Ambulatory Visit (INDEPENDENT_AMBULATORY_CARE_PROVIDER_SITE_OTHER): Payer: Medicare HMO | Admitting: Family Medicine

## 2017-01-15 ENCOUNTER — Encounter: Payer: Self-pay | Admitting: Family Medicine

## 2017-01-15 VITALS — BP 136/88 | HR 68 | Temp 98.4°F | Ht 71.0 in | Wt 242.2 lb

## 2017-01-15 DIAGNOSIS — R1011 Right upper quadrant pain: Secondary | ICD-10-CM

## 2017-01-15 DIAGNOSIS — R1903 Right lower quadrant abdominal swelling, mass and lump: Secondary | ICD-10-CM | POA: Diagnosis not present

## 2017-01-15 NOTE — Patient Instructions (Signed)
We will call you within a week or two about your referral for CT scan abdomen and pelvis. If you do not hear within 3 weeks, give [place] a call.  Pick up contrast from front desk before you leave

## 2017-01-15 NOTE — Progress Notes (Addendum)
Subjective:  Nicholas Owen is a 71 y.o. year old very pleasant male patient who presents for/with See problem oriented charting ROS- no nausea, vomiting. No constipation or diarrhea. No chest pain. No exertional abdominal pain.    Past Medical History-  Patient Active Problem List   Diagnosis Date Noted  . 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 History of DVT of lower extremity 08/31/2012    Priority: High  . History of pulmonary embolism 04/13/2012    Priority: High  . History of prostate cancer 05/14/2011    Priority: High  . Urinary incontinence 09/20/2014    Priority: Medium  . Diverticulosis 03/22/2014    Priority: Medium  . Hyperglycemia 05/12/2012    Priority: Medium  . GERD 10/04/2007    Priority: Medium  . Hyperlipidemia 03/08/2007    Priority: Medium  . Erectile dysfunction 03/22/2014    Priority: Low  . Personal history of colonic polyps 08/31/2012    Priority: Low  . Basal cell carcinoma     Priority: Low  . Obesity 10/08/2011    Priority: Low  . 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 Kidney stone 06/02/2010    Priority: Low  . Allergic rhinitis 10/04/2007    Priority: Low  . Elevated blood pressure reading 04/29/2016    Medications- reviewed and updated Current Outpatient Prescriptions  Medication Sig Dispense Refill  . aspirin 325 MG tablet Take 325 mg by mouth daily.    Marland Kitchen co-enzyme Q-10 50 MG capsule Take 200 mg by mouth daily.    . Multiple Vitamins-Minerals (MULTIVITAMIN WITH MINERALS) tablet Take 1 tablet by mouth daily.    Marland Kitchen omeprazole (PRILOSEC) 20 MG capsule TAKE 1 CAPSULE EVERY DAY 90 capsule 3  . pravastatin (PRAVACHOL) 20 MG tablet TAKE 1 TABLET EVERY DAY 90 tablet 3   No current facility-administered medications for this visit.     Objective: BP 136/88 (BP Location: Left Arm, Patient Position: Sitting, Cuff Size: Large)   Pulse 68   Temp 98.4 F (36.9 C) (Oral)   Ht 5\' 11"   (1.803 m)   Wt 242 lb 3.2 oz (109.9 kg)   SpO2 96%   BMI 33.78 kg/m  Gen: NAD, resting comfortably CV: RRR no murmurs rubs or gallops Lungs: CTAB no crackles, wheeze, rhonchi Abdomen: soft/nontender on exam today/nondistended/normal bowel sounds. No rebound or guarding.  Ext: no edema Skin: warm, dry, no rash over abdomen  Assessment/Plan:  RLQ abdominal mass - Plan: CT Abdomen Pelvis W Contrast  RUQ abdominal pain - Plan: CT Abdomen Pelvis W Contrast S: Patient was seen about a month ago for intermittent pain in right side of abdomen- both RUQ and RLQ. Has been going on since at least mid may. We thought could be abdominal wall strain as it got worse with certain lifts that required tightening of abdomen- he took it easy on these (we had been concerned about weight loss potentially but has gained weight since last visit. He states he has been "Religious"bout heat 3x a day 20 minutes. Avoided lifting/straining. Despite this the pain persists at similar levels perhaps 0-1/10. He is concerned about potential laignancy A/P: discussed with patient I thought malignancy potential was slim but pain for 3 months that is not resolving with conservative care- I agreed reasonable to pursue ct abd/pelvis for further workup. We discussed chance of incidental finding- some which were reviewed from CT scan a few years ago. Also discussed radiation risk.  He would still like to proceed for peace of mind. He does have prostate cancer but has done well after prostatectomy and does not report any change in follow up with dr. Risa Grill Lab Results  Component Value Date   PSA 0.00 (L) 10/23/2015   PSA 0.00 Repeated and verified X2. (L) 09/05/2013   PSA 4.79 (H) 04/25/2011    Orders Placed This Encounter  Procedures  . CT Abdomen Pelvis W Contrast    Standing Status:   Future    Standing Expiration Date:   04/17/2018    Order Specific Question:   If indicated for the ordered procedure, I authorize the  administration of contrast media per Radiology protocol    Answer:   Yes    Order Specific Question:   Preferred imaging location?    Answer:   [place]    Order Specific Question:   Radiology Contrast Protocol - do NOT remove file path    Answer:   [email or web address]    Order Specific Question:   Reason for Exam additional comments    Answer:   thought to be msk strain of abdominal wall- but present for 3 months despite rest, heat. mild symptoms in either RLQ or RUQ   The duration of face-to-face time during this visit was greater than 15 minutes. Greater than 50% of this time was spent in counseling, explanation of diagnosis, planning of further management, and/or coordination of care including discussing risks of radiation and incidental findings.    Return precautions advised.  [name], MD

## 2017-01-15 NOTE — Addendum Note (Signed)
Addended by: Marin Olp on: 01/15/2017 10:12 PM   Modules accepted: Level of Service

## 2017-01-16 ENCOUNTER — Ambulatory Visit (INDEPENDENT_AMBULATORY_CARE_PROVIDER_SITE_OTHER)
Admission: RE | Admit: 2017-01-16 | Discharge: 2017-01-16 | Disposition: A | Discharge status: Home / Self Care | Payer: Medicare HMO | Source: Ambulatory Visit | Attending: Family Medicine | Admitting: Family Medicine

## 2017-01-16 DIAGNOSIS — R1903 Right lower quadrant abdominal swelling, mass and lump: Secondary | ICD-10-CM

## 2017-01-16 DIAGNOSIS — R109 Unspecified abdominal pain: Secondary | ICD-10-CM | POA: Diagnosis not present

## 2017-01-16 DIAGNOSIS — R1011 Right upper quadrant pain: Secondary | ICD-10-CM | POA: Diagnosis not present

## 2017-01-16 MED ORDER — IOPAMIDOL (ISOVUE-300) INJECTION 61%
100.0000 mL | Freq: Once | INTRAVENOUS | Status: AC | PRN
Start: 2017-01-16 — End: 2017-01-16
  Administered 2017-01-16: 100 mL via INTRAVENOUS

## 2017-03-11 ENCOUNTER — Ambulatory Visit (INDEPENDENT_AMBULATORY_CARE_PROVIDER_SITE_OTHER): Payer: Medicare HMO

## 2017-03-11 DIAGNOSIS — Z23 Encounter for immunization: Secondary | ICD-10-CM

## 2017-05-06 ENCOUNTER — Ambulatory Visit: Payer: Medicare HMO | Admitting: *Deleted

## 2017-05-08 ENCOUNTER — Ambulatory Visit: Payer: Medicare HMO | Admitting: *Deleted

## 2017-05-08 ENCOUNTER — Encounter: Payer: Self-pay | Admitting: *Deleted

## 2017-05-08 ENCOUNTER — Encounter: Payer: Self-pay | Admitting: Family Medicine

## 2017-05-08 ENCOUNTER — Ambulatory Visit: Payer: Medicare HMO | Admitting: Family Medicine

## 2017-05-08 VITALS — BP 122/82 | HR 65 | Temp 98.3°F | Resp 16 | Ht 71.0 in | Wt 244.0 lb

## 2017-05-08 VITALS — BP 122/82 | HR 65 | Temp 98.3°F | Ht 71.0 in | Wt 244.0 lb

## 2017-05-08 DIAGNOSIS — R739 Hyperglycemia, unspecified: Secondary | ICD-10-CM

## 2017-05-08 DIAGNOSIS — D6862 Lupus anticoagulant syndrome: Secondary | ICD-10-CM | POA: Diagnosis not present

## 2017-05-08 DIAGNOSIS — Z1331 Encounter for screening for depression: Secondary | ICD-10-CM

## 2017-05-08 DIAGNOSIS — E785 Hyperlipidemia, unspecified: Secondary | ICD-10-CM | POA: Diagnosis not present

## 2017-05-08 DIAGNOSIS — K219 Gastro-esophageal reflux disease without esophagitis: Secondary | ICD-10-CM

## 2017-05-08 DIAGNOSIS — Z0000 Encounter for general adult medical examination without abnormal findings: Secondary | ICD-10-CM | POA: Diagnosis not present

## 2017-05-08 NOTE — Progress Notes (Signed)
Pre visit review using our clinic review tool, if applicable. No additional management support is needed unless otherwise documented below in the visit note. 

## 2017-05-08 NOTE — Progress Notes (Signed)
I have reviewed and agree with note, evaluation, plan. See my separate note today.   Stephen Hunter, MD 

## 2017-05-08 NOTE — Assessment & Plan Note (Signed)
No recent issues. Remains on aspirin 325mg  for prevention of clots/thrombosis

## 2017-05-08 NOTE — Progress Notes (Signed)
PCP notes:   Health maintenance: NA   Abnormal screenings: None.   Patient concerns: None.   Nurse concerns: None.   Next PCP appt: 05/08/17 11:15.

## 2017-05-08 NOTE — Patient Instructions (Signed)
No medication changes today  Goal in 6 months of 10 lbs weight loss  Consider weight watchers, myfitnesspal app or calorie king to help count calories  Consider boosting exercise  You can do this! You can prevent the onset of diabetes!

## 2017-05-08 NOTE — Patient Instructions (Addendum)
Mr. Nicholas Owen ,  Please schedule an appointment for an eye exam.  Thank you for taking time to come for your Medicare Wellness Visit. I appreciate your ongoing commitment to your health goals. Please review the following plan we discussed and let me know if I can assist you in the future.   These are the goals we discussed: Goals    . Maintain current health status       This is a list of the screening recommended for you and due dates:  Health Maintenance  Topic Date Due  . Colon Cancer Screening  01/08/2020  . Tetanus Vaccine  09/13/2023  . Flu Shot  Completed  .  Hepatitis C: One time screening is recommended by Center for Disease Control  (CDC) for  adults born from [birth year] through 1963.   Completed  . Pneumonia vaccines  Completed   Preventive Care for Adults  A healthy lifestyle and preventive care can promote health and wellness. Preventive health guidelines for adults include the following key practices.  . A routine yearly physical is a good way to check with your health care provider about your health and preventive screening. It is a chance to share any concerns and updates on your health and to receive a thorough exam.  . Visit your dentist for a routine exam and preventive care every 6 months. Brush your teeth twice a day and floss once a day. Good oral hygiene prevents tooth decay and gum disease.  . The frequency of eye exams is based on your age, health, family medical history, use  of contact lenses, and other factors. Follow your health care provider's recommendations for frequency of eye exams.  . Eat a healthy diet. Foods like vegetables, fruits, whole grains, low-fat dairy products, and lean protein foods contain the nutrients you need without too many calories. Decrease your intake of foods high in solid fats, added sugars, and salt. Eat the right amount of calories for you. Get information about a proper diet from your health care provider, if necessary.  .  Regular physical exercise is one of the most important things you can do for your health. Most adults should get at least 150 minutes of moderate-intensity exercise (any activity that increases your heart rate and causes you to sweat) each week. In addition, most adults need muscle-strengthening exercises on 2 or more days a week.  Silver Sneakers may be a benefit available to you. To determine eligibility, you may visit the website: www.silversneakers.com or contact program at 915-307-8333 Mon-Fri between 8AM-8PM.   . Maintain a healthy weight. The body mass index (BMI) is a screening tool to identify possible weight problems. It provides an estimate of body fat based on height and weight. Your health care provider can find your BMI and can help you achieve or maintain a healthy weight.   For adults 20 years and older: ? A BMI below 18.5 is considered underweight. ? A BMI of 18.5 to 24.9 is normal. ? A BMI of 25 to 29.9 is considered overweight. ? A BMI of 30 and above is considered obese.   . Maintain normal blood lipids and cholesterol levels by exercising and minimizing your intake of saturated fat. Eat a balanced diet with plenty of fruit and vegetables. Blood tests for lipids and cholesterol should begin at age 3 and be repeated every 5 years. If your lipid or cholesterol levels are high, you are over 50, or you are at high risk for heart disease, you  may need your cholesterol levels checked more frequently. Ongoing high lipid and cholesterol levels should be treated with medicines if diet and exercise are not working.  . If you smoke, find out from your health care provider how to quit. If you do not use tobacco, please do not start.  . If you choose to drink alcohol, please do not consume more than 2 drinks per day. One drink is considered to be 12 ounces (355 mL) of beer, 5 ounces (148 mL) of wine, or 1.5 ounces (44 mL) of liquor.  . If you are 70-42 years old, ask your health care  provider if you should take aspirin to prevent strokes.  . Use sunscreen. Apply sunscreen liberally and repeatedly throughout the day. You should seek shade when your shadow is shorter than you. Protect yourself by wearing long sleeves, pants, a wide-brimmed hat, and sunglasses year round, whenever you are outdoors.  . Once a month, do a whole body skin exam, using a mirror to look at the skin on your back. Tell your health care provider of new moles, moles that have irregular borders, moles that are larger than a pencil eraser, or moles that have changed in shape or color.

## 2017-05-08 NOTE — Assessment & Plan Note (Addendum)
S:  Patient had lost 10 lbs over last year but slowed recently. Discussed increasing DM risk still Lab Results  Component Value Date   HGBA1C 6.1 11/04/2016   HGBA1C 6.0 09/20/2014   HGBA1C 5.8 03/07/2014    A/P: advised patient at least 10-20 lbs weight loss over 6 months. He feels he eats reasonably on one hand but when diving in- would have a hard time reducing burger/meat consumption including from places like  Metformin. See AVS for some goals for him. Recheck a1c   Declines metformin for now

## 2017-05-08 NOTE — Assessment & Plan Note (Signed)
S: reasonably controlled on pravastatin alone at pravastatin 20mg . No myalgias. Also on aspirin 325 due to hypercoaguability state  He is working out at [employer] about 3x a week Lab Results  Component Value Date   CHOL 157 11/04/2016   HDL 39.00 (L) 11/04/2016   LDLCALC 96 11/04/2016   TRIG 109.0 11/04/2016   CHOLHDL 4 11/04/2016   A/P: lipids updated in June- discussed role of weight loss

## 2017-05-08 NOTE — Assessment & Plan Note (Signed)
S:  on prilosec 20mg  daily. Have discussed role of weight loss. He is at least 10 lbs from last year and that seems to have helped his symptoms A/P: continue current medicine- if loses more weight would try to see if we could get him on zantac 150mg  BID

## 2017-05-08 NOTE — Progress Notes (Signed)
Subjective:  Nicholas Owen is a 71 y.o. year old very pleasant male patient who presents for/with See problem oriented charting ROS- No chest pain or shortness of breath. No headache or blurry vision. Admits to difficulty losing weigh   Past Medical History-  Patient Active Problem List   Diagnosis Date Noted  . Lupus anticoagulant disorder ([place]) 04/30/2015    Priority: High  . History of DVT of lower extremity 08/31/2012    Priority: High  . History of pulmonary embolism 04/13/2012    Priority: High  . History of prostate cancer 05/14/2011    Priority: High  . Urinary incontinence 09/20/2014    Priority: Medium  . Diverticulosis 03/22/2014    Priority: Medium  . Hyperglycemia 05/12/2012    Priority: Medium  . GERD 10/04/2007    Priority: Medium  . Hyperlipidemia 03/08/2007    Priority: Medium  . Erectile dysfunction 03/22/2014    Priority: Low  . Personal history of colonic polyps 08/31/2012    Priority: Low  . Basal cell carcinoma     Priority: Low  . Obesity 10/08/2011    Priority: Low  . 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 Kidney stone 06/02/2010    Priority: Low  . Allergic rhinitis 10/04/2007    Priority: Low  . Elevated blood pressure reading 04/29/2016    Medications- reviewed and updated Current Outpatient Medications  Medication Sig Dispense Refill  . aspirin 325 MG tablet Take 325 mg by mouth daily.    Marland Kitchen co-enzyme Q-10 50 MG capsule Take 200 mg by mouth daily.    . Multiple Vitamins-Minerals (MULTIVITAMIN WITH MINERALS) tablet Take 1 tablet by mouth daily.    Marland Kitchen omeprazole (PRILOSEC) 20 MG capsule TAKE 1 CAPSULE EVERY DAY 90 capsule 3  . pravastatin (PRAVACHOL) 20 MG tablet TAKE 1 TABLET EVERY DAY 90 tablet 3   No current facility-administered medications for this visit.     Objective: BP 122/82   Pulse 65   Temp 98.3 F (36.8 C) (Oral)   Ht 5\' 11"  (1.803 m)   Wt 244 lb (110.7 kg)   SpO2 94%   BMI 34.03 kg/m   Gen: NAD, resting comfortably CV: RRR no murmurs rubs or gallops Lungs: CTAB no crackles, wheeze, rhonchi Abdomen: soft/nontender/nondistended/normal bowel sounds. obese Ext: no edema Skin: warm, dry Neuro: normal gait  Assessment/Plan:  Follow up 2019 colonoscopy per his strong preference with family history- refer next visit  Thanked him for his service  GERD S:  on prilosec 20mg  daily. Have discussed role of weight loss. He is at least 10 lbs from last year and that seems to have helped his symptoms A/P: continue current medicine- if loses more weight would try to see if we could get him on zantac 150mg  BID  Hyperlipidemia S: reasonably controlled on pravastatin alone at pravastatin 20mg . No myalgias. Also on aspirin 325 due to hypercoaguability state  He is working out at [employer] about 3x a week Lab Results  Component Value Date   CHOL 157 11/04/2016   HDL 39.00 (L) 11/04/2016   LDLCALC 96 11/04/2016   TRIG 109.0 11/04/2016   CHOLHDL 4 11/04/2016   A/P: lipids updated in June- discussed role of weight loss  Lupus anticoagulant disorder ([place]) No recent issues. Remains on aspirin 325mg  for prevention of clots/thrombosis  Hyperglycemia S:  Patient had lost 10 lbs over last year but slowed recently. Discussed increasing DM risk still Lab Results  Component Value Date  HGBA1C 6.1 11/04/2016   HGBA1C 6.0 09/20/2014   HGBA1C 5.8 03/07/2014    A/P: advised patient at least 10-20 lbs weight loss over 6 months. He feels he eats reasonably on one hand but when diving in- would have a hard time reducing burger/meat consumption including from places like  Metformin. See AVS for some goals for him. Recheck a1c   Declines metformin for now   Future Appointments  Date Time Provider [place]  05/10/2018 11:00 AM Marin Olp, MD [facility]  05/11/2018 10:00 AM Williemae Area, RN [facility]   Return precautions advised.  [name], MD

## 2017-05-08 NOTE — Progress Notes (Signed)
Subjective:   Nicholas Owen is a 71 y.o. male who presents for Medicare Annual/Subsequent preventive examination.  Lives in two story single family home with wife.   Review of Systems:  No ROS.  Medicare Wellness Visit. Additional risk factors are reflected in the social history.  Gets up 3x/night to use restroom. Gets 8-9 hours of sleep. Goes to bed at 2am and gets up around 10am. Wakes up feeling rested.  Cardiac Risk Factors include: dyslipidemia;obesity (BMI >30kg/m2);male gender;advanced age (>82men, >70 women)     Objective:    Vitals: BP 122/82 (BP Location: Right Arm, Patient Position: Sitting, Cuff Size: Large)   Pulse 65   Temp 98.3 F (36.8 C) (Oral)   Resp 16   Ht 5\' 11"  (1.803 m)   Wt 244 lb (110.7 kg)   SpO2 94%   BMI 34.03 kg/m   Body mass index is 34.03 kg/m.  Advanced Directives 05/08/2017 11/17/2014 10/17/2014 10/14/2014 06/16/2014 04/13/2012 04/13/2012  Does Patient Have a Medical Advance Directive? No No No No No Patient would not like information Patient would not like information  Would patient like information on creating a medical advance directive? Yes (MAU/Ambulatory/Procedural Areas - Information given) - No - patient declined information No - patient declined information No - patient declined information - -  Pre-existing out of facility DNR order (yellow form or pink MOST form) - - - - - - No    Tobacco Social History   Tobacco Use  Smoking Status Never Smoker  Smokeless Tobacco Never Used  Tobacco Comment   never used tobacco     Counseling given: Not Answered Comment: never used tobacco   Past Medical History:  Diagnosis Date  . Allergy   . Asbestos exposure   . Basal cell carcinoma    skin of left ear  . BPH (benign prostatic hyperplasia)   . Cancer ([place])    basal cell CA (skin)  . Colitis 01/2011  . Colon polyp   . Deviated nasal septum    hx. of  . Diverticulosis   . DVT (deep venous thrombosis) ([place])   . GERD  (gastroesophageal reflux disease)   . Hyperlipidemia   . Kidney stones   . Lipoma 12-24-11   multiple(present- lipoma 2-lt./4- rt arms/ back/ chest  . Liver cyst 10/11   4 simple cysts in liver  . 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 Prostate cancer ([place]) 12-24-11   dx. Prostate cancer after bx.  . Ulcerative colitis [facility])    Past Surgical History:  Procedure Laterality Date  . COLONOSCOPY    . CYSTOSCOPY  02/16/2012   Procedure: CYSTOSCOPY FLEXIBLE;  Surgeon: Bernestine Amass, MD;  Location: [facility];  Service: Urology;  Laterality: N/A;  FLEX CYSTO WITH REMOVAL OF STAPLES   . INGUINAL HERNIA REPAIR  1990's   double  . KNEE SURGERY  2007   right knee scope  . lipoma removals  1990's   multiple and multiple remains now  . LYMPH NODE DISSECTION  12/31/2011   Procedure: LYMPH NODE DISSECTION;  Surgeon: Bernestine Amass, MD;  Location: [facility];  Service: Urology;  Laterality: Bilateral;  Bilateral  . POLYPECTOMY    . ROBOT ASSISTED LAPAROSCOPIC RADICAL PROSTATECTOMY  12/31/2011   Procedure: ROBOTIC ASSISTED LAPAROSCOPIC RADICAL PROSTATECTOMY;  Surgeon: Bernestine Amass, MD;  Location: [facility];  Service: Urology;  Laterality: N/A;      . TONSILLECTOMY  Family History  Problem Relation Age of Onset  . Colon cancer Mother 45  . Colon cancer Father 55  . Prostate cancer Paternal Grandfather   . Esophageal cancer Neg Hx   . Stomach cancer Neg Hx    Social History   Socioeconomic History  . Marital status: Married    Spouse name: None  . Number of children: None  . Years of education: None  . Highest education level: None  Social Needs  . Financial resource strain: None  . Food insecurity - worry: None  . Food insecurity - inability: None  . Transportation needs - medical: None  . Transportation needs - non-medical: None  Occupational History  . Occupation: Retired    Fish farm manager: RETIRED  Tobacco Use  . Smoking status: Never  Smoker  . Smokeless tobacco: Never Used  . Tobacco comment: never used tobacco  Substance and Sexual Activity  . Alcohol use: Yes    Alcohol/week: 0.0 - 0.6 oz    Comment: occasional   . Drug use: No  . Sexual activity: Yes  Other Topics Concern  . 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       Hobbies: travels fair amount has timeshare and also travels more than that, enjoys Haematologist, home repair      Physician roster   Delfin Edis - gastroenterology in past   Burney Gauze - hematology (released around 2016)   Dr. Risa Grill - urology   Dr. Herbert Deaner - Ophthalmology   Dr. [place] Crane- dermatology    Outpatient Encounter Medications as of 05/08/2017  Medication Sig  . aspirin 325 MG tablet Take 325 mg by mouth daily.  Marland Kitchen co-enzyme Q-10 50 MG capsule Take 200 mg by mouth daily.  . Multiple Vitamins-Minerals (MULTIVITAMIN WITH MINERALS) tablet Take 1 tablet by mouth daily.  Marland Kitchen omeprazole (PRILOSEC) 20 MG capsule TAKE 1 CAPSULE EVERY DAY  . pravastatin (PRAVACHOL) 20 MG tablet TAKE 1 TABLET EVERY DAY   No facility-administered encounter medications on file as of 05/08/2017.     Activities of Daily Living In your present state of health, do you have any difficulty performing the following activities: 05/08/2017  Hearing? N  Vision? N  Difficulty concentrating or making decisions? N  Walking or climbing stairs? N  Dressing or bathing? N  Doing errands, shopping? N  Preparing Food and eating ? N  Using the Toilet? N  In the past six months, have you accidently leaked urine? Y  Comment hx of prostatectomy   Do you have problems with loss of bowel control? N  Managing your Medications? N  Managing your Finances? N  Housekeeping or managing your Housekeeping? N  Some recent data might be hidden     Patient Care Team: Marin Olp, MD as PCP - General (Family Medicine) Monna Fam, MD  as Consulting Physician (Ophthalmology) Rana Snare, MD as Consulting Physician (Urology)   Assessment:    Physical assessment deferred to PCP.  Exercise Activities and Dietary recommendations Current Exercise Habits: Structured exercise class, Type of exercise: strength training/weights;Other - see comments(stationary bicycle ), Time (Minutes): 60, Frequency (Times/Week): 3, Weekly Exercise (Minutes/Week): 180, Exercise limited by: None identified  Eats 3 meals/day. Patient states he eats a pretty healthy diet. States he has a good appetite.  Goals    . Maintain current health status      Fall Risk Fall Risk  05/08/2017 11/04/2016 10/30/2015  04/30/2015 10/17/2014  Falls in the past year? No No No No No    Depression Screen PHQ 2/9 Scores 05/08/2017 11/04/2016 10/30/2015 04/30/2015  PHQ - 2 Score 0 0 0 0  PHQ- 9 Score 0 - - -  PHQ2 and PHQ9 completed. No signs or symptoms of depression. Total time spent on this topic was 9 minutes.  Cognitive Function Ad8 score reviewed for issues:  Issues making decisions:no  Less interest in hobbies / activities:no  Repeats questions, stories (family complaining):no  Trouble using ordinary gadgets (microwave, computer, phone):no  Forgets the month or year: no  Mismanaging finances: no  Remembering appts:no  Daily problems with thinking and/or memory:no Ad8 score is=0 Patient does not participate in brain stimulating activities.          Immunization History  Administered Date(s) Administered  . Influenza Split 03/19/2011, 03/26/2012  . Influenza Whole 03/15/2008, 02/22/2009, 02/25/2010  . Influenza, High Dose Seasonal PF 02/28/2015, 04/09/2016, 03/11/2017  . Influenza,inj,Quad PF,6+ Mos 03/18/2013, 03/07/2014  . Pneumococcal Conjugate-13 04/30/2015  . Pneumococcal Polysaccharide-23 09/12/2013  . Td 11/01/2003, 09/12/2013  . Zoster 05/14/2011   Screening Tests Health Maintenance  Topic Date Due  . COLONOSCOPY  01/08/2020  .  TETANUS/TDAP  09/13/2023  . INFLUENZA VACCINE  Completed  . Hepatitis C Screening  Completed  . PNA vac Low Risk Adult  Completed       Plan:   Follow up with PCP as directed.  I have personally reviewed and noted the following in the patient's chart:   . Medical and social history . Use of alcohol, tobacco or illicit drugs  . Current medications and supplements . Functional ability and status . Nutritional status . Physical activity . Advanced directives . List of other physicians . Vitals . Screenings to include cognitive, depression, and falls . Referrals and appointments  In addition, I have reviewed and discussed with patient certain preventive protocols, quality metrics, and best practice recommendations. A written personalized care plan for preventive services as well as general preventive health recommendations were provided to patient.     Williemae Area, RN  05/08/2017

## 2017-08-07 DIAGNOSIS — H524 Presbyopia: Secondary | ICD-10-CM | POA: Diagnosis not present

## 2017-08-07 DIAGNOSIS — H5203 Hypermetropia, bilateral: Secondary | ICD-10-CM | POA: Diagnosis not present

## 2017-08-07 DIAGNOSIS — H349 Unspecified retinal vascular occlusion: Secondary | ICD-10-CM | POA: Diagnosis not present

## 2017-08-07 DIAGNOSIS — H2513 Age-related nuclear cataract, bilateral: Secondary | ICD-10-CM | POA: Diagnosis not present

## 2017-09-03 ENCOUNTER — Other Ambulatory Visit: Payer: Self-pay | Admitting: Family Medicine

## 2017-10-09 DIAGNOSIS — C61 Malignant neoplasm of prostate: Secondary | ICD-10-CM | POA: Diagnosis not present

## 2017-10-15 ENCOUNTER — Other Ambulatory Visit: Payer: Self-pay | Admitting: Family Medicine

## 2017-10-16 ENCOUNTER — Other Ambulatory Visit: Payer: Self-pay

## 2017-10-16 DIAGNOSIS — M7731 Calcaneal spur, right foot: Secondary | ICD-10-CM | POA: Diagnosis not present

## 2017-10-16 DIAGNOSIS — M71571 Other bursitis, not elsewhere classified, right ankle and foot: Secondary | ICD-10-CM | POA: Diagnosis not present

## 2017-10-16 DIAGNOSIS — M722 Plantar fascial fibromatosis: Secondary | ICD-10-CM | POA: Diagnosis not present

## 2017-10-16 DIAGNOSIS — M79673 Pain in unspecified foot: Secondary | ICD-10-CM

## 2017-10-19 DIAGNOSIS — N5201 Erectile dysfunction due to arterial insufficiency: Secondary | ICD-10-CM | POA: Diagnosis not present

## 2017-10-19 DIAGNOSIS — N393 Stress incontinence (female) (male): Secondary | ICD-10-CM | POA: Diagnosis not present

## 2017-10-19 DIAGNOSIS — Z8546 Personal history of malignant neoplasm of prostate: Secondary | ICD-10-CM | POA: Diagnosis not present

## 2017-10-22 DIAGNOSIS — M71571 Other bursitis, not elsewhere classified, right ankle and foot: Secondary | ICD-10-CM | POA: Diagnosis not present

## 2017-10-22 DIAGNOSIS — M722 Plantar fascial fibromatosis: Secondary | ICD-10-CM | POA: Diagnosis not present

## 2018-01-07 ENCOUNTER — Encounter: Payer: Self-pay | Admitting: Gastroenterology

## 2018-01-14 ENCOUNTER — Encounter: Payer: Self-pay | Admitting: Gastroenterology

## 2018-02-15 ENCOUNTER — Ambulatory Visit (INDEPENDENT_AMBULATORY_CARE_PROVIDER_SITE_OTHER): Payer: Medicare HMO | Admitting: Family Medicine

## 2018-02-15 ENCOUNTER — Encounter: Payer: Self-pay | Admitting: Family Medicine

## 2018-02-15 VITALS — BP 110/80 | HR 67 | Temp 98.3°F | Ht 71.0 in | Wt 229.6 lb

## 2018-02-15 DIAGNOSIS — J029 Acute pharyngitis, unspecified: Secondary | ICD-10-CM

## 2018-02-15 LAB — POCT RAPID STREP A (OFFICE): Rapid Strep A Screen: NEGATIVE

## 2018-02-15 MED ORDER — PREDNISONE 20 MG PO TABS: ORAL_TABLET | ORAL | 0 refills | Status: DC

## 2018-02-15 NOTE — Patient Instructions (Addendum)
Health Maintenance Due  Topic Date Due  . INFLUENZA VACCINE - when you are feeling better 12/31/2017   This could be prolonged drainage from the infection you had- doesn't look like a classic sinus infection. Could be a prolonged viral pharyngitis. Lets try prednisone for a week since your strep test was negative. If you arent doing better in 7-10 days OR symptoms worsen- see [place] back

## 2018-02-15 NOTE — Progress Notes (Signed)
Subjective:  Nicholas Owen is a 72 y.o. year old very pleasant male patient who presents for/with See problem oriented charting ROS- no fever, chills, shortness of breath, continued sore throat, cough mainly at night   Past Medical History-  Patient Active Problem List   Diagnosis Date Noted  . Lupus anticoagulant disorder ([place]) 04/30/2015    Priority: High  . History of DVT of lower extremity 08/31/2012    Priority: High  . History of pulmonary embolism 04/13/2012    Priority: High  . History of prostate cancer 05/14/2011    Priority: High  . Urinary incontinence 09/20/2014    Priority: Medium  . Diverticulosis 03/22/2014    Priority: Medium  . Hyperglycemia 05/12/2012    Priority: Medium  . GERD 10/04/2007    Priority: Medium  . Hyperlipidemia 03/08/2007    Priority: Medium  . Erectile dysfunction 03/22/2014    Priority: Low  . Personal history of colonic polyps 08/31/2012    Priority: Low  . Basal cell carcinoma     Priority: Low  . Obesity 10/08/2011    Priority: Low  . 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 Kidney stone 06/02/2010    Priority: Low  . Allergic rhinitis 10/04/2007    Priority: Low  . Elevated blood pressure reading 04/29/2016    Medications- reviewed and updated Current Outpatient Medications  Medication Sig Dispense Refill  . aspirin 325 MG tablet Take 325 mg by mouth daily.    Marland Kitchen co-enzyme Q-10 50 MG capsule Take 200 mg by mouth daily.    . Multiple Vitamins-Minerals (MULTIVITAMIN WITH MINERALS) tablet Take 1 tablet by mouth daily.    Marland Kitchen omeprazole (PRILOSEC) 20 MG capsule TAKE 1 CAPSULE EVERY DAY 90 capsule 1  . pravastatin (PRAVACHOL) 20 MG tablet TAKE 1 TABLET EVERY DAY 90 tablet 3  . predniSONE (DELTASONE) 20 MG tablet Take 1 tablet by mouth daily for 5 days, then 1/2 tablet daily for 2 days 6 tablet 0   No current facility-administered medications for this visit.     Objective: BP 110/80 (BP Location: Left  Arm, Patient Position: Sitting, Cuff Size: Large)   Pulse 67   Temp 98.3 F (36.8 C) (Oral)   Ht 5\' 11"  (1.803 m)   Wt 229 lb 9.6 oz (104.1 kg)   SpO2 95%   BMI 32.02 kg/m  Gen: NAD, resting comfortably Dry drainage in naresErythema in pharynx but no edema. No tonsils. No sinus pressure CV: RRR no murmurs rubs or gallops Lungs: CTAB no crackles, wheeze, rhonchi Abdomen: soft/nontender/nondistended/normal bowel sounds.  Ext: no edema Skin: warm, dry, no rash  Results for orders placed or performed in visit on 02/15/18 (from the past 24 hour(s))  POCT rapid strep A     Status: None   Collection Time: 02/15/18  4:52 PM  Result Value Ref Range   Rapid Strep A Screen Negative Negative   Assessment/Plan:  Viral pharyngitis S:3 weeks of symptoms  Most prominent symptom- Sore throat noted as moderate but simply persistent- worried about strep.  He is s/pTonsillectomy. Mucinex for a week but zero mucus up. listerine gargle seems to help.  Dry discharge in Am from nose. No blood.  Hacking cough mainly at night. No fever or shortness of breath.  Sinus drainage Denies sinus pressure  A/P: from avs "This could be prolonged drainage from the infection you had- doesn't look like a classic sinus infection. Could be a prolonged viral pharyngitis. Lets try prednisone  for a week since your strep test was negative. If you arent doing better in 7-10 days OR symptoms worsen- see [place] back " Doubt peritonsillar abscess. Doubt strep particularly with negative strep test. Discussed allergies as possibility- not a general issue for him. This is on the extended side for viral pharyngitis and we discussed following up if not improved   Future Appointments  Date Time Provider [place]  02/19/2018 11:00 AM [facility] PREVISIT RM50 [facility]  03/05/2018 11:30 AM Danis, Kirke Corin, MD [facility]  05/10/2018 11:00 AM Marin Olp, MD [facility]  05/11/2018 10:00 AM [facility] HEALTH  COACH [facility]   Lab/Order associations: Sore throat - Plan: POCT rapid strep A  Meds ordered this encounter  Medications  . predniSONE (DELTASONE) 20 MG tablet    Sig: Take 1 tablet by mouth daily for 5 days, then 1/2 tablet daily for 2 days    Dispense:  6 tablet    Refill:  0   Return precautions advised.  [name], MD

## 2018-02-19 ENCOUNTER — Ambulatory Visit (AMBULATORY_SURGERY_CENTER): Payer: Self-pay

## 2018-02-19 ENCOUNTER — Other Ambulatory Visit: Payer: Self-pay

## 2018-02-19 ENCOUNTER — Encounter: Payer: Self-pay | Admitting: Gastroenterology

## 2018-02-19 VITALS — Ht 72.0 in | Wt 237.6 lb

## 2018-02-19 DIAGNOSIS — Z86010 Personal history of colonic polyps: Secondary | ICD-10-CM

## 2018-02-19 DIAGNOSIS — Z800 Family history of malignant neoplasm of digestive organs: Secondary | ICD-10-CM

## 2018-02-19 MED ORDER — NA SULFATE-K SULFATE-MG SULF 17.5-3.13-1.6 GM/177ML PO SOLN: 1.0000 | Freq: Once | ORAL | 0 refills | Status: AC

## 2018-02-19 NOTE — Progress Notes (Signed)
No egg or soy allergy known to patient  No issues with past sedation with any surgeries  or procedures, no intubation problems  No diet pills per patient No home 02 use per patient  No blood thinners per patient  Pt denies issues with constipation  No A fib or A flutter  EMMI video sent to pt's e mail  

## 2018-02-23 ENCOUNTER — Ambulatory Visit (INDEPENDENT_AMBULATORY_CARE_PROVIDER_SITE_OTHER): Payer: Medicare HMO | Admitting: Family Medicine

## 2018-02-23 ENCOUNTER — Encounter: Payer: Self-pay | Admitting: Family Medicine

## 2018-02-23 VITALS — BP 108/68 | HR 58 | Temp 98.6°F | Ht 72.0 in | Wt 230.8 lb

## 2018-02-23 DIAGNOSIS — R05 Cough: Secondary | ICD-10-CM

## 2018-02-23 DIAGNOSIS — R0989 Other specified symptoms and signs involving the circulatory and respiratory systems: Secondary | ICD-10-CM | POA: Diagnosis not present

## 2018-02-23 DIAGNOSIS — R059 Cough, unspecified: Secondary | ICD-10-CM

## 2018-02-23 DIAGNOSIS — J029 Acute pharyngitis, unspecified: Secondary | ICD-10-CM

## 2018-02-23 MED ORDER — AMOXICILLIN-POT CLAVULANATE 875-125 MG PO TABS: 1.0000 | ORAL_TABLET | Freq: Two times a day (BID) | ORAL | 0 refills | Status: AC

## 2018-02-23 NOTE — Patient Instructions (Addendum)
Health Maintenance Due  Topic Date Due  . INFLUENZA VACCINE -schedule when you feel better 12/31/2017   Start augmentin for 7 days. See [place] back if not feeling better on this or sooner if symptoms worsen. I didn't find a clear source of infection today- augmentin would give good coverage for sinuses and strep throat. Also sometimes bronchitis can be bacterial  - about a 5% chance- and should provide some coverage there if this is bacterial bronchitis

## 2018-02-23 NOTE — Progress Notes (Signed)
Subjective:  Nicholas Owen is a 72 y.o. year old very pleasant male patient who presents for/with See problem oriented charting ROS- continued sore throat. Cough has improved. Some chest congestion. No fever or shortness of breath. No edema   Past Medical History-  Patient Active Problem List   Diagnosis Date Noted  . Lupus anticoagulant disorder ([place]) 04/30/2015    Priority: High  . History of DVT of lower extremity 08/31/2012    Priority: High  . History of pulmonary embolism 04/13/2012    Priority: High  . History of prostate cancer 05/14/2011    Priority: High  . Urinary incontinence 09/20/2014    Priority: Medium  . Diverticulosis 03/22/2014    Priority: Medium  . Hyperglycemia 05/12/2012    Priority: Medium  . GERD 10/04/2007    Priority: Medium  . Hyperlipidemia 03/08/2007    Priority: Medium  . Erectile dysfunction 03/22/2014    Priority: Low  . Personal history of colonic polyps 08/31/2012    Priority: Low  . Basal cell carcinoma     Priority: Low  . Obesity 10/08/2011    Priority: Low  . 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 Kidney stone 06/02/2010    Priority: Low  . Allergic rhinitis 10/04/2007    Priority: Low  . Elevated blood pressure reading 04/29/2016    Medications- reviewed and updated Current Outpatient Medications  Medication Sig Dispense Refill  . Ascorbic Acid (VITAMIN C) 1000 MG tablet Take 1,000 mg by mouth daily.    Marland Kitchen aspirin 325 MG tablet Take 325 mg by mouth daily.    Marland Kitchen co-enzyme Q-10 50 MG capsule Take 200 mg by mouth daily.    . Multiple Vitamins-Minerals (MULTIVITAMIN WITH MINERALS) tablet Take 1 tablet by mouth daily.    Marland Kitchen omeprazole (PRILOSEC) 20 MG capsule TAKE 1 CAPSULE EVERY DAY 90 capsule 1  . pravastatin (PRAVACHOL) 20 MG tablet TAKE 1 TABLET EVERY DAY 90 tablet 3  . amoxicillin-clavulanate (AUGMENTIN) 875-125 MG tablet Take 1 tablet by mouth 2 (two) times daily for 7 days. 14 tablet 0   No current  facility-administered medications for this visit.     Objective: BP 108/68 (BP Location: Left Arm, Patient Position: Sitting, Cuff Size: Large)   Pulse (!) 58   Temp 98.6 F (37 C) (Oral)   Ht 6' (1.829 m)   Wt 230 lb 12.8 oz (104.7 kg)   SpO2 93%   BMI 31.30 kg/m  Gen: NAD, resting comfortably CV: RRR no murmurs rubs or gallops Lungs: CTAB no crackles, wheeze, rhonchi Abdomen: soft/nontender/nondistended/normal bowel sounds.  Ext: no edema Skin: warm, dry  Assessment/Plan:  Sore throat  Cough  Chest congestion S: Been going on for about a month total now. patient with continued sore throat despite prednisone. Pain about 5/10.  Improved nighttime cough. Occasional cough but mainly dry- some lights amount of white mucus. Still denies sinus pressure. Some nasal discharge in AM- dried thick mucus. Some feelings of chest congestion.  No shortness of breath. No fevers. No nausea/vomiting.  A/P: 72 year old male with continued sore throat for months duration. Did not respond to prednisone last week for viral pharyngitis vs. Postviral cough. No sinus pressure and no significant drainage- so not clear cut sinusitis. Strep test negative last week and exam does not appear to show strep. Could be bronchitis- given 4 weeks of symptoms without improvement we will trial augmentin in case bacterial either sinusitis, bronchitis, strep pharyngitis (most strongly  doubt last of these options)  Future Appointments  Date Time Provider [place]  03/05/2018 11:30 AM Danis, Kirke Corin, MD [facility]  05/10/2018 11:00 AM Marin Olp, MD [facility]  05/11/2018 10:00 AM [facility] HEALTH COACH [facility]    Meds ordered this encounter  Medications  . amoxicillin-clavulanate (AUGMENTIN) 875-125 MG tablet    Sig: Take 1 tablet by mouth 2 (two) times daily for 7 days.    Dispense:  14 tablet    Refill:  0    Return precautions advised.  [name], MD

## 2018-03-05 ENCOUNTER — Ambulatory Visit (AMBULATORY_SURGERY_CENTER): Payer: Medicare HMO | Admitting: Gastroenterology

## 2018-03-05 ENCOUNTER — Encounter: Payer: Self-pay | Admitting: Gastroenterology

## 2018-03-05 VITALS — BP 107/73 | HR 57 | Temp 98.4°F | Resp 11 | Ht 72.0 in | Wt 237.0 lb

## 2018-03-05 DIAGNOSIS — D122 Benign neoplasm of ascending colon: Secondary | ICD-10-CM | POA: Diagnosis not present

## 2018-03-05 DIAGNOSIS — Z1211 Encounter for screening for malignant neoplasm of colon: Secondary | ICD-10-CM

## 2018-03-05 DIAGNOSIS — Z800 Family history of malignant neoplasm of digestive organs: Secondary | ICD-10-CM

## 2018-03-05 DIAGNOSIS — E669 Obesity, unspecified: Secondary | ICD-10-CM | POA: Diagnosis not present

## 2018-03-05 DIAGNOSIS — Z86010 Personal history of colonic polyps: Secondary | ICD-10-CM | POA: Diagnosis not present

## 2018-03-05 MED ORDER — SODIUM CHLORIDE 0.9 % IV SOLN: 500.0000 mL | Freq: Once | INTRAVENOUS | Status: DC

## 2018-03-05 NOTE — Progress Notes (Signed)
Lipoma noted to bilateral inner forearm upon admission.

## 2018-03-05 NOTE — Patient Instructions (Signed)
**   Handouts given on polyps and diverticulosis **   YOU HAD AN ENDOSCOPIC PROCEDURE TODAY AT THE [facility]:   Refer to the procedure report that was given to you for any specific questions about what was found during the examination.  If the procedure report does not answer your questions, please call your gastroenterologist to clarify.  If you requested that your care partner not be given the details of your procedure findings, then the procedure report has been included in a sealed envelope for you to review at your convenience later.  YOU SHOULD EXPECT: Some feelings of bloating in the abdomen. Passage of more gas than usual.  Walking can help get rid of the air that was put into your GI tract during the procedure and reduce the bloating. If you had a lower endoscopy (such as a colonoscopy or flexible sigmoidoscopy) you may notice spotting of blood in your stool or on the toilet paper. If you underwent a bowel prep for your procedure, you may not have a normal bowel movement for a few days.  Please Note:  You might notice some irritation and congestion in your nose or some drainage.  This is from the oxygen used during your procedure.  There is no need for concern and it should clear up in a day or so.  SYMPTOMS TO REPORT IMMEDIATELY:   Following lower endoscopy (colonoscopy or flexible sigmoidoscopy):  Excessive amounts of blood in the stool  Significant tenderness or worsening of abdominal pains  Swelling of the abdomen that is new, acute  Fever of 100F or higher  For urgent or emergent issues, a gastroenterologist can be reached at any hour by calling [phone].   DIET:  We do recommend a small meal at first, but then you may proceed to your regular diet.  Drink plenty of fluids but you should avoid alcoholic beverages for 24 hours.  ACTIVITY:  You should plan to take it easy for the rest of today and you should NOT DRIVE or use heavy machinery until tomorrow (because  of the sedation medicines used during the test).    FOLLOW UP: Our staff will call the number listed on your records the next business day following your procedure to check on you and address any questions or concerns that you may have regarding the information given to you following your procedure. If we do not reach you, we will leave a message.  However, if you are feeling well and you are not experiencing any problems, there is no need to return our call.  We will assume that you have returned to your regular daily activities without incident.  If any biopsies were taken you will be contacted by phone or by letter within the next 1-3 weeks.  Please call us at [phone] if you have not heard about the biopsies in 3 weeks.    SIGNATURES/CONFIDENTIALITY: You and/or your care partner have signed paperwork which will be entered into your electronic medical record.  These signatures attest to the fact that that the information above on your After Visit Summary has been reviewed and is understood.  Full responsibility of the confidentiality of this discharge information lies with you and/or your care-partner. 

## 2018-03-05 NOTE — Progress Notes (Signed)
Called to room to assist during endoscopic procedure.  Patient ID and intended procedure confirmed with present staff. Received instructions for my participation in the procedure from the performing physician.  

## 2018-03-05 NOTE — Op Note (Signed)
Nicholas Owen Patient Name: Nicholas Owen Procedure Date: 03/05/2018 11:06 AM MRN: [id number] Endoscopist: Mallie Mussel L. Nicholas Owen , MD Age: 72 Referring MD:  Date of Birth: November 20, 1945 Gender: Male Account #: [id number] Procedure:                Colonoscopy Indications:              Screening patient at increased risk: Family history                            of colorectal cancer in multiple 1st-degree                            relatives (mother and father) Medicines:                Monitored Anesthesia Care Procedure:                Pre-Anesthesia Assessment:                           - Prior to the procedure, a History and Physical                            was performed, and patient medications and                            allergies were reviewed. [name] tolerance of                            previous anesthesia was also reviewed. The risks                            and benefits of the procedure and the sedation                            options and risks were discussed with the patient.                            All questions were answered, and informed consent                            was obtained. Anticoagulants: The patient has taken                            aspirin. It was decided not to withhold this                            medication prior to the procedure. ASA Grade                            Assessment: II - A patient with mild systemic                            disease. After reviewing the risks and benefits,  the patient was deemed in satisfactory condition to                            undergo the procedure.                           After obtaining informed consent, the colonoscope                            was passed under direct vision. Throughout the                            procedure, the patient's blood pressure, pulse, and                            oxygen saturations were monitored continuously. The                     Colonoscope was introduced through the anus and                            advanced to the the cecum, identified by                            appendiceal orifice and ileocecal valve. The                            colonoscopy was somewhat difficult due to multiple                            diverticula in the colon, a redundant colon and a                            tortuous colon. Successful completion of the                            procedure was aided by using manual pressure. The                            patient tolerated the procedure well. The quality                            of the bowel preparation was good. The ileocecal                            valve, appendiceal orifice, and rectum were                            photographed. The quality of the bowel preparation                            was evaluated using the BBPS [facility] Bowel  Preparation Scale) with scores of: Right Colon = 3,                            Transverse Colon = 2 and Left Colon = 2. The total                            BBPS score equals 7. Scope In: 11:16:32 AM Scope Out: 11:35:29 AM Scope Withdrawal Time: 0 hours 13 minutes 48 seconds  Total Procedure Duration: 0 hours 18 minutes 57 seconds  Findings:                 The perianal and digital rectal examinations were                            normal.                           Many diverticula were found in the left colon.                           The left colon was tortuous and redundant.                           Two sessile polyps were found in the ascending                            colon. The polyps were 4 to 6 mm in size. These                            polyps were removed with a cold snare. Resection                            and retrieval were complete.                           The exam was otherwise without abnormality on                            direct and retroflexion views. Complications:             No immediate complications. Estimated Blood Loss:     Estimated blood loss: none. Estimated blood loss                            was minimal. Impression:               - Diverticulosis in the left colon.                           - Tortuous colon.                           - Two 4 to 6 mm polyps in the ascending colon,  removed with a cold snare. Resected and retrieved.                           - The examination was otherwise normal on direct                            and retroflexion views. Recommendation:           - Patient has a contact number available for                            emergencies. The signs and symptoms of potential                            delayed complications were discussed with the                            patient. Return to normal activities tomorrow.                            Written discharge instructions were provided to the                            patient.                           - Resume previous diet.                           - Continue present medications.                           - Await pathology results.                           - Repeat colonoscopy in 5 years for surveillance.  L. Nicholas Carrow, MD 03/05/2018 11:43:24 AM This report has been signed electronically.

## 2018-03-05 NOTE — Progress Notes (Signed)
To recovery, report to RN, VSS. 

## 2018-03-05 NOTE — Progress Notes (Signed)
Pt's states no medical or surgical changes since previsit or office visit. 

## 2018-03-08 ENCOUNTER — Telehealth: Payer: Self-pay

## 2018-03-08 NOTE — Telephone Encounter (Signed)
  Follow up Call-  Call back number 03/05/2018  Post procedure Call Back phone  # [phone]  Permission to leave phone message Yes  Some recent data might be hidden     Patient questions:  Do you have a fever, pain , or abdominal swelling? No. Pain Score  0 *  Have you tolerated food without any problems? Yes.    Have you been able to return to your normal activities? Yes.    Do you have any questions about your discharge instructions: Diet   No. Medications  No. Follow up visit  No.  Do you have questions or concerns about your Care? No.  Actions: * If pain score is 4 or above: No action needed, pain <4.

## 2018-03-12 ENCOUNTER — Encounter: Payer: Self-pay | Admitting: Gastroenterology

## 2018-04-20 ENCOUNTER — Encounter: Payer: Self-pay | Admitting: Family Medicine

## 2018-04-20 ENCOUNTER — Ambulatory Visit: Payer: Self-pay | Admitting: *Deleted

## 2018-04-20 ENCOUNTER — Ambulatory Visit (INDEPENDENT_AMBULATORY_CARE_PROVIDER_SITE_OTHER): Payer: Medicare HMO | Admitting: Family Medicine

## 2018-04-20 VITALS — BP 138/84 | HR 62 | Temp 98.4°F | Ht 72.0 in | Wt 234.2 lb

## 2018-04-20 DIAGNOSIS — R1031 Right lower quadrant pain: Secondary | ICD-10-CM | POA: Diagnosis not present

## 2018-04-20 LAB — POC URINALSYSI DIPSTICK (AUTOMATED)
BILIRUBIN UA: NEGATIVE
Blood, UA: NEGATIVE
GLUCOSE UA: NEGATIVE
Ketones, UA: NEGATIVE
LEUKOCYTES UA: NEGATIVE
Nitrite, UA: NEGATIVE
PH UA: 5.5 (ref 5.0–8.0)
Protein, UA: NEGATIVE
Spec Grav, UA: >=1.03 — AB (ref 1.010–1.025)
UROBILINOGEN UA: 0.2 U/dL

## 2018-04-20 NOTE — Progress Notes (Addendum)
Subjective:  Nicholas Owen is a 72 y.o. year old very pleasant male patient who presents for/with See problem oriented charting ROS-no fever, chills, nausea, vomiting.  Past Medical History-  Patient Active Problem List   Diagnosis Date Noted  . Lupus anticoagulant disorder ([place]) 04/30/2015    Priority: High  . History of DVT of lower extremity 08/31/2012    Priority: High  . History of pulmonary embolism 04/13/2012    Priority: High  . History of prostate cancer 05/14/2011    Priority: High  . Urinary incontinence 09/20/2014    Priority: Medium  . Diverticulosis 03/22/2014    Priority: Medium  . Hyperglycemia 05/12/2012    Priority: Medium  . GERD 10/04/2007    Priority: Medium  . Hyperlipidemia 03/08/2007    Priority: Medium  . Erectile dysfunction 03/22/2014    Priority: Low  . Personal history of colonic polyps 08/31/2012    Priority: Low  . Basal cell carcinoma     Priority: Low  . Obesity 10/08/2011    Priority: Low  . 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 Kidney stone 06/02/2010    Priority: Low  . Allergic rhinitis 10/04/2007    Priority: Low  . Elevated blood pressure reading 04/29/2016    Medications- reviewed and updated Current Outpatient Medications  Medication Sig Dispense Refill  . Ascorbic Acid (VITAMIN C) 1000 MG tablet Take 1,000 mg by mouth daily.    Marland Kitchen aspirin 325 MG tablet Take 325 mg by mouth daily.    Marland Kitchen co-enzyme Q-10 50 MG capsule Take 200 mg by mouth daily.    . Multiple Vitamins-Minerals (MULTIVITAMIN WITH MINERALS) tablet Take 1 tablet by mouth daily.    Marland Kitchen omeprazole (PRILOSEC) 20 MG capsule TAKE 1 CAPSULE EVERY DAY 90 capsule 1  . pravastatin (PRAVACHOL) 20 MG tablet TAKE 1 TABLET EVERY DAY 90 tablet 3   No current facility-administered medications for this visit.     Objective: BP 138/84 (BP Location: Left Arm, Patient Position: Sitting, Cuff Size: Large)   Pulse 62   Temp 98.4 F (36.9 C) (Oral)   Ht  6' (1.829 m)   Wt 234 lb 3.2 oz (106.2 kg)   SpO2 96%   BMI 31.76 kg/m  Gen: NAD, resting comfortably CV: RRR no murmurs rubs or gallops Lungs: CTAB no crackles, wheeze, rhonchi Abdomen: soft/nontender mostly- but does get occasional mild pain with deep palpation in RLQ/nondistended/normal bowel sounds. No rebound or guarding.  Skin: warm, dry Neuro: speech normal, moves all extremities Msk: walks stiffly in anticipation of pain  Results for orders placed or performed in visit on 04/20/18 (from the past 24 hour(s))  POCT Urinalysis Dipstick (Automated)     Status: Abnormal   Collection Time: 04/20/18  3:30 PM  Result Value Ref Range   Color, UA Yellow    Clarity, UA Clear    Glucose, UA Negative Negative   Bilirubin, UA Negative    Ketones, UA Negative    Spec Grav, UA >=1.030 (A) 1.010 - 1.025   Blood, UA Negative    pH, UA 5.5 5.0 - 8.0   Protein, UA Negative Negative   Urobilinogen, UA 0.2 0.2 or 1.0 E.U./dL   Nitrite, UA Negative    Leukocytes, UA Negative Negative   Assessment/Plan:  Right lower quadrant pain  s: right lower abdominal pain starting day after the colonoscopy- at first thought it was just from the procedure itself- wondered if his diverticula were irritated. Has  been going on since 03/06/18- at first was very low level but has intensified in last 1.5 to 2 weeks. Sharp shooting pain when gets out of bed to go to the bathroom. Doesn't happen as much when up moving around unless turns certain way. Sharp shooting 8-9/10 pain but short lived.  He has good regular bowel movements 1-2 times a day.  States somewhat firm. A/P: 72 year old man with right lower quadrant pain that is worsening over the last week and a half but remains largely intermittent. - UA without blood-kidney stones less likely - We will get CBC, CMP, CRP-if white count over 10,000 and CRP over 8- would have strong likelihood ratio +23 appendicitis and would get CT scan - Patient thinks this could  be diverticulitis-I would be willing to treat with Cipro and Flagyl if did not have elevated CRP, WBC combo as above - I if combo CRP and WBC not elevated, patient fails to improve on Cipro/Flagyl- would still opt for CT scan-likely would use contrast - Patient has had 2 CT scans in the last 5 years so we are trying to reduce radiation if possible  Future Appointments  Date Time Provider [place]  05/11/2018 10:00 AM [facility]  05/19/2018 10:15 AM Marin Olp, MD [facility]  New acute problem today with unknown/potentially dangerous underlying etiology   Lab/Order associations: Right lower quadrant abdominal pain - Plan: POCT Urinalysis Dipstick (Automated), CBC with Differential/Platelet, Comprehensive metabolic panel, C-reactive protein  Return precautions advised.  [name], MD

## 2018-04-20 NOTE — Telephone Encounter (Signed)
Patient reports he has had pain since colonoscopy he does have history of colitis and thinks he may have flare with that. He reports his symptoms are worse at night and tend to be positional.  Reason for Disposition . [1] SEVERE pain AND [2] age > 22    Call to office- they will see him in office  Answer Assessment - Initial Assessment Questions 1. LOCATION: "Where does it hurt?"      Lower R abdominal pain 2. RADIATION: "Does the pain shoot anywhere else?" (e.g., chest, back)     no 3. ONSET: "When did the pain begin?" (Minutes, hours or days ago)      10/4- since colonscopy 4. SUDDEN: "Gradual or sudden onset?"     Gradual pain 5. PATTERN "Does the pain come and go, or is it constant?"    - If constant: "Is it getting better, staying the same, or worsening?"      (Note: Constant means the pain never goes away completely; most serious pain is constant and it progresses)     - If intermittent: "How long does it last?" "Do you have pain now?"     (Note: Intermittent means the pain goes away completely between bouts)     This last week has been more painful- pain comes and goes 6. SEVERITY: "How bad is the pain?"  (e.g., Scale 1-10; mild, moderate, or severe)    - MILD (1-3): doesn't interfere with normal activities, abdomen soft and not tender to touch     - MODERATE (4-7): interferes with normal activities or awakens from sleep, tender to touch     - SEVERE (8-10): excruciating pain, doubled over, unable to do any normal activities       Sharp shooting pain- level 10- short lived 7. RECURRENT SYMPTOM: "Have you ever had this type of abdominal pain before?" If so, ask: "When was the last time?" and "What happened that time?"      Yes- feels like pain that was caused by diverticulitis- 2 years ago 8. CAUSE: "What do you think is causing the abdominal pain?"     Possible diverticulitis 9. RELIEVING/AGGRAVATING FACTORS: "What makes it better or worse?" (e.g., movement, antacids, bowel  movement)     Movement, better when patient is up during the day- hurts with specific movements 10. OTHER SYMPTOMS: "Has there been any vomiting, diarrhea, constipation, or urine problems?"       No other symptoms reported  Protocols used: ABDOMINAL PAIN - MALE-A-AH

## 2018-04-20 NOTE — Patient Instructions (Signed)
Health Maintenance Due  Topic Date Due  . INFLUENZA VACCINE-we need to get to this when you are feeling better 12/31/2017   Please stop by lab before you go   The lab combination I am ordering-if 2 levels are elevated we will get a CT scan to rule out appendicitis which would also evaluate for diverticulitis.  If 2 levels are not elevated we can trial Cipro and Flagyl for diverticulitis.  If we treat with Cipro and Flagyl antibiotics and you do not improve-would get CT scan at that point

## 2018-04-20 NOTE — Telephone Encounter (Signed)
See note

## 2018-04-21 LAB — COMPREHENSIVE METABOLIC PANEL
ALT: 20 U/L (ref 0–53)
AST: 18 U/L (ref 0–37)
Albumin: 4.6 g/dL (ref 3.5–5.2)
Alkaline Phosphatase: 62 U/L (ref 39–117)
BUN: 19 mg/dL (ref 6–23)
CO2: 24 meq/L (ref 19–32)
Calcium: 10.1 mg/dL (ref 8.4–10.5)
Chloride: 105 mEq/L (ref 96–112)
Creatinine, Ser: 0.9 mg/dL (ref 0.40–1.50)
GFR: 88.46 mL/min (ref >60.00)
Glucose, Bld: 97 mg/dL (ref 70–99)
POTASSIUM: 4.2 meq/L (ref 3.5–5.1)
SODIUM: 138 meq/L (ref 135–145)
TOTAL PROTEIN: 6.7 g/dL (ref 6.0–8.3)
Total Bilirubin: 1.2 mg/dL (ref 0.2–1.2)

## 2018-04-21 LAB — CBC WITH DIFFERENTIAL/PLATELET
Basophils Absolute: 0 10*3/uL (ref 0.0–0.1)
Basophils Relative: 0.9 % (ref 0.0–3.0)
EOS PCT: 6.9 % — AB (ref 0.0–5.0)
Eosinophils Absolute: 0.4 10*3/uL (ref 0.0–0.7)
HCT: 47.1 % (ref 39.0–52.0)
Hemoglobin: 16.3 g/dL (ref 13.0–17.0)
LYMPHS ABS: 1 10*3/uL (ref 0.7–4.0)
Lymphocytes Relative: 17.9 % (ref 12.0–46.0)
MCHC: 34.6 g/dL (ref 30.0–36.0)
MCV: 86.8 fl (ref 78.0–100.0)
MONO ABS: 0.5 10*3/uL (ref 0.1–1.0)
Monocytes Relative: 9.2 % (ref 3.0–12.0)
NEUTROS PCT: 65.1 % (ref 43.0–77.0)
Neutro Abs: 3.7 10*3/uL (ref 1.4–7.7)
Platelets: 328 10*3/uL (ref 150.0–400.0)
RBC: 5.43 Mil/uL (ref 4.22–5.81)
RDW: 13.8 % (ref 11.5–15.5)
WBC: 5.7 10*3/uL (ref 4.0–10.5)

## 2018-04-21 LAB — C-REACTIVE PROTEIN: CRP: 0.2 mg/dL — ABNORMAL LOW (ref 0.5–20.0)

## 2018-04-21 MED ORDER — METRONIDAZOLE 500 MG PO TABS: 500.0000 mg | ORAL_TABLET | Freq: Three times a day (TID) | ORAL | 0 refills | Status: AC

## 2018-04-21 MED ORDER — CIPROFLOXACIN HCL 500 MG PO TABS: 500.0000 mg | ORAL_TABLET | Freq: Two times a day (BID) | ORAL | 0 refills | Status: AC

## 2018-04-21 NOTE — Addendum Note (Signed)
Addended by: Marin Olp on: 04/21/2018 01:11 PM   Modules accepted: Orders

## 2018-04-21 NOTE — Telephone Encounter (Signed)
Patient was seen in office yesterday 

## 2018-04-27 DIAGNOSIS — X32XXXD Exposure to sunlight, subsequent encounter: Secondary | ICD-10-CM | POA: Diagnosis not present

## 2018-04-27 DIAGNOSIS — L570 Actinic keratosis: Secondary | ICD-10-CM | POA: Diagnosis not present

## 2018-04-27 DIAGNOSIS — C44319 Basal cell carcinoma of skin of other parts of face: Secondary | ICD-10-CM | POA: Diagnosis not present

## 2018-05-10 ENCOUNTER — Ambulatory Visit: Payer: Medicare HMO | Admitting: Family Medicine

## 2018-05-11 ENCOUNTER — Ambulatory Visit: Payer: Medicare HMO

## 2018-05-17 ENCOUNTER — Ambulatory Visit: Payer: Medicare HMO | Admitting: Family Medicine

## 2018-05-19 ENCOUNTER — Ambulatory Visit (INDEPENDENT_AMBULATORY_CARE_PROVIDER_SITE_OTHER): Payer: Medicare HMO | Admitting: Family Medicine

## 2018-05-19 ENCOUNTER — Encounter: Payer: Self-pay | Admitting: Family Medicine

## 2018-05-19 VITALS — BP 124/80 | HR 64 | Temp 97.9°F | Ht 72.0 in | Wt 231.8 lb

## 2018-05-19 DIAGNOSIS — Z23 Encounter for immunization: Secondary | ICD-10-CM

## 2018-05-19 DIAGNOSIS — Z6831 Body mass index (BMI) 31.0-31.9, adult: Secondary | ICD-10-CM | POA: Diagnosis not present

## 2018-05-19 DIAGNOSIS — K5792 Diverticulitis of intestine, part unspecified, without perforation or abscess without bleeding: Secondary | ICD-10-CM

## 2018-05-19 DIAGNOSIS — E785 Hyperlipidemia, unspecified: Secondary | ICD-10-CM | POA: Diagnosis not present

## 2018-05-19 NOTE — Patient Instructions (Addendum)
Health Maintenance Due  Topic Date Due  . INFLUENZA VACCINE - today- thanks for doing this 12/31/2017   Schedule a physical in 3-6 month time frame- we need to update cholesterol #s as has been a while - keep taking the medication  Great job on weight loss with exercise, improved diet, cutting down on carbonated beverages!

## 2018-05-19 NOTE — Assessment & Plan Note (Signed)
S: sharp RLQ abdominal pain resolved on cipro/flagyl combo- we treated empirically for diverticulitis to try to avoid radiation as already had 2 CT scans in last 5 years. Luckily this was very helpful and he is symptom free today. Occasionally has some very mild discomfort but nothing like he had previously.  A/P: Full resolution of symptoms other than very mild intermittent right lower quadrant pain- I do not believe he needs further antibiotics at this time.  He knows to follow-up with new or worsening symptoms

## 2018-05-19 NOTE — Progress Notes (Signed)
Subjective:  Nicholas Owen is a 72 y.o. year old very pleasant male patient who presents for/with See problem oriented charting ROS-sharp right lower quadrant pains have resolved.  No fever or chills.  No chest pain or shortness of breath reported.  Past Medical History-  Patient Active Problem List   Diagnosis Date Noted  . Lupus anticoagulant disorder ([place]) 04/30/2015    Priority: High  . History of DVT of lower extremity 08/31/2012    Priority: High  . History of pulmonary embolism 04/13/2012    Priority: High  . History of prostate cancer 05/14/2011    Priority: High  . Diverticulitis 05/19/2018    Priority: Medium  . Urinary incontinence 09/20/2014    Priority: Medium  . Hyperglycemia 05/12/2012    Priority: Medium  . GERD 10/04/2007    Priority: Medium  . Hyperlipidemia 03/08/2007    Priority: Medium  . Diverticulosis 03/22/2014    Priority: Low  . Erectile dysfunction 03/22/2014    Priority: Low  . Personal history of colonic polyps 08/31/2012    Priority: Low  . Basal cell carcinoma     Priority: Low  . Obesity 10/08/2011    Priority: Low  . 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 Kidney stone 06/02/2010    Priority: Low  . Allergic rhinitis 10/04/2007    Priority: Low  . Elevated blood pressure reading 04/29/2016    Medications- reviewed and updated Current Outpatient Medications  Medication Sig Dispense Refill  . Ascorbic Acid (VITAMIN C) 1000 MG tablet Take 1,000 mg by mouth daily.    Marland Kitchen aspirin 325 MG tablet Take 325 mg by mouth daily.    Marland Kitchen co-enzyme Q-10 50 MG capsule Take 200 mg by mouth daily.    . Multiple Vitamins-Minerals (MULTIVITAMIN WITH MINERALS) tablet Take 1 tablet by mouth daily.    Marland Kitchen omeprazole (PRILOSEC) 20 MG capsule TAKE 1 CAPSULE EVERY DAY 90 capsule 1  . pravastatin (PRAVACHOL) 20 MG tablet TAKE 1 TABLET EVERY DAY 90 tablet 3   No current facility-administered medications for this visit.      Objective: BP 124/80 (BP Location: Left Arm, Patient Position: Sitting, Cuff Size: Large)   Pulse 64   Temp 97.9 F (36.6 C) (Oral)   Ht 6' (1.829 m)   Wt 231 lb 12.8 oz (105.1 kg)   SpO2 94%   BMI 31.44 kg/m  Gen: NAD, resting comfortably CV: RRR no murmurs rubs or gallops Lungs: CTAB no crackles, wheeze, rhonchi Abdomen: soft/nontender/nondistended/normal bowel sounds. No rebound or guarding.  Ext: no edema Skin: warm, dry  Assessment/Plan:  Other notes: 1.flu shot today 2.  Elevated BMI/obesity noted-fortunately patient has been working on this and has lost some weight. Working out and cut back on food.  I congratulated him on his efforts and encouraged him to continue  Diverticulitis S: sharp RLQ abdominal pain resolved on cipro/flagyl combo- we treated empirically for diverticulitis to try to avoid radiation as already had 2 CT scans in last 5 years. Luckily this was very helpful and he is symptom free today. Occasionally has some very mild discomfort but nothing like he had previously.  A/P: Full resolution of symptoms other than very mild intermittent right lower quadrant pain- I do not believe he needs further antibiotics at this time.  He knows to follow-up with new or worsening symptoms  Hyperlipidemia  S: well controlled on pravastatin 20mg  in the past Lab Results  Component Value Date   CHOL  157 11/04/2016   HDL 39.00 (L) 11/04/2016   LDLCALC 96 11/04/2016   TRIG 109.0 11/04/2016   CHOLHDL 4 11/04/2016   A/P: Discussed updating lipid panel today-patient wants to defer this until his physical which she will schedule in 3 to 6 months-hopefully with weight loss his LDL is even further improved  3 to 57-month physical advised  Lab/Order associations: Diverticulitis  Hyperlipidemia, unspecified hyperlipidemia type  BMI 31.0-31.9,adult  Need for prophylactic vaccination and inoculation against influenza - Plan: Flu vaccine HIGH DOSE PF  Return precautions  advised.  [name], MD

## 2018-05-19 NOTE — Assessment & Plan Note (Signed)
  S: well controlled on pravastatin 20mg  in the past Lab Results  Component Value Date   CHOL 157 11/04/2016   HDL 39.00 (L) 11/04/2016   LDLCALC 96 11/04/2016   TRIG 109.0 11/04/2016   CHOLHDL 4 11/04/2016   A/P: Discussed updating lipid panel today-patient wants to defer this until his physical which she will schedule in 3 to 6 months-hopefully with weight loss his LDL is even further improved

## 2018-06-01 ENCOUNTER — Other Ambulatory Visit: Payer: Self-pay | Admitting: Family Medicine

## 2018-06-08 DIAGNOSIS — L570 Actinic keratosis: Secondary | ICD-10-CM | POA: Diagnosis not present

## 2018-06-08 DIAGNOSIS — Z08 Encounter for follow-up examination after completed treatment for malignant neoplasm: Secondary | ICD-10-CM | POA: Diagnosis not present

## 2018-06-08 DIAGNOSIS — X32XXXD Exposure to sunlight, subsequent encounter: Secondary | ICD-10-CM | POA: Diagnosis not present

## 2018-06-08 DIAGNOSIS — Z85828 Personal history of other malignant neoplasm of skin: Secondary | ICD-10-CM | POA: Diagnosis not present

## 2018-08-11 DIAGNOSIS — H349 Unspecified retinal vascular occlusion: Secondary | ICD-10-CM | POA: Diagnosis not present

## 2018-08-11 DIAGNOSIS — H5203 Hypermetropia, bilateral: Secondary | ICD-10-CM | POA: Diagnosis not present

## 2018-08-11 DIAGNOSIS — H2513 Age-related nuclear cataract, bilateral: Secondary | ICD-10-CM | POA: Diagnosis not present

## 2018-10-18 DIAGNOSIS — Z8546 Personal history of malignant neoplasm of prostate: Secondary | ICD-10-CM | POA: Diagnosis not present

## 2018-10-18 LAB — PSA: PSA: 0.015

## 2018-10-22 DIAGNOSIS — Z8546 Personal history of malignant neoplasm of prostate: Secondary | ICD-10-CM | POA: Diagnosis not present

## 2018-10-22 DIAGNOSIS — N393 Stress incontinence (female) (male): Secondary | ICD-10-CM | POA: Diagnosis not present

## 2018-10-27 ENCOUNTER — Other Ambulatory Visit: Payer: Self-pay | Admitting: Family Medicine

## 2018-11-26 ENCOUNTER — Encounter: Payer: Self-pay | Admitting: Family Medicine

## 2019-01-14 ENCOUNTER — Telehealth: Payer: Self-pay | Admitting: Family Medicine

## 2019-01-14 NOTE — Telephone Encounter (Signed)
I left a message asking the patient to call me at [phone] to schedule AWV with Courtney. VDM (Dee-Dee) °

## 2019-03-07 ENCOUNTER — Other Ambulatory Visit: Payer: Self-pay | Admitting: Family Medicine

## 2019-04-11 ENCOUNTER — Telehealth: Payer: Self-pay | Admitting: Family Medicine

## 2019-04-11 NOTE — Telephone Encounter (Signed)
I left a message asking the patient to call and schedule Medicare AWV with Courtney ([facility]).  If patient calls back, please schedule Medicare Wellness Visit at next available opening.  VDM (Dee-Dee) °

## 2019-06-01 ENCOUNTER — Other Ambulatory Visit: Payer: Self-pay | Admitting: Family Medicine

## 2019-06-01 NOTE — Telephone Encounter (Signed)
pravastatin (PRAVACHOL) 20 MG tablet    Patient is requesting a partial fill (5-7 tablets) if possible. Patient has not received medication from [facility] yet and has taken his last tablet yesterday.    [place], [place] - [id number] Phone:  [phone]  Fax:  [phone]

## 2019-06-02 NOTE — Telephone Encounter (Signed)
See note

## 2019-07-21 ENCOUNTER — Other Ambulatory Visit: Payer: Self-pay | Admitting: Family Medicine

## 2019-08-29 ENCOUNTER — Ambulatory Visit (INDEPENDENT_AMBULATORY_CARE_PROVIDER_SITE_OTHER): Payer: Medicare HMO | Admitting: Family Medicine

## 2019-08-29 ENCOUNTER — Other Ambulatory Visit: Payer: Self-pay

## 2019-08-29 ENCOUNTER — Encounter: Payer: Self-pay | Admitting: Family Medicine

## 2019-08-29 VITALS — BP 120/78 | HR 75 | Temp 98.3°F | Ht 72.0 in | Wt 254.4 lb

## 2019-08-29 DIAGNOSIS — Z8546 Personal history of malignant neoplasm of prostate: Secondary | ICD-10-CM | POA: Diagnosis not present

## 2019-08-29 DIAGNOSIS — E785 Hyperlipidemia, unspecified: Secondary | ICD-10-CM

## 2019-08-29 DIAGNOSIS — D6862 Lupus anticoagulant syndrome: Secondary | ICD-10-CM

## 2019-08-29 DIAGNOSIS — Z86718 Personal history of other venous thrombosis and embolism: Secondary | ICD-10-CM

## 2019-08-29 DIAGNOSIS — Z86711 Personal history of pulmonary embolism: Secondary | ICD-10-CM | POA: Diagnosis not present

## 2019-08-29 DIAGNOSIS — K219 Gastro-esophageal reflux disease without esophagitis: Secondary | ICD-10-CM | POA: Diagnosis not present

## 2019-08-29 DIAGNOSIS — Z79899 Other long term (current) drug therapy: Secondary | ICD-10-CM | POA: Diagnosis not present

## 2019-08-29 DIAGNOSIS — R739 Hyperglycemia, unspecified: Secondary | ICD-10-CM

## 2019-08-29 DIAGNOSIS — Z0000 Encounter for general adult medical examination without abnormal findings: Secondary | ICD-10-CM | POA: Diagnosis not present

## 2019-08-29 LAB — COMPREHENSIVE METABOLIC PANEL
ALT: 24 U/L (ref 0–53)
AST: 19 U/L (ref 0–37)
Albumin: 4.3 g/dL (ref 3.5–5.2)
Alkaline Phosphatase: 71 U/L (ref 39–117)
BUN: 18 mg/dL (ref 6–23)
CO2: 25 mEq/L (ref 19–32)
Calcium: 9.3 mg/dL (ref 8.4–10.5)
Chloride: 105 mEq/L (ref 96–112)
Creatinine, Ser: 0.98 mg/dL (ref 0.40–1.50)
GFR: 75.15 mL/min (ref >60.00)
Glucose, Bld: 106 mg/dL — ABNORMAL HIGH (ref 70–99)
Potassium: 4.4 mEq/L (ref 3.5–5.1)
Sodium: 139 mEq/L (ref 135–145)
Total Bilirubin: 1.2 mg/dL (ref 0.2–1.2)
Total Protein: 6.3 g/dL (ref 6.0–8.3)

## 2019-08-29 LAB — VITAMIN B12: Vitamin B-12: 244 pg/mL (ref 211–911)

## 2019-08-29 LAB — CBC WITH DIFFERENTIAL/PLATELET
Basophils Absolute: 0.1 10*3/uL (ref 0.0–0.1)
Basophils Relative: 1 % (ref 0.0–3.0)
Eosinophils Absolute: 0.7 10*3/uL (ref 0.0–0.7)
Eosinophils Relative: 11 % — ABNORMAL HIGH (ref 0.0–5.0)
HCT: 45.4 % (ref 39.0–52.0)
Hemoglobin: 15.6 g/dL (ref 13.0–17.0)
Lymphocytes Relative: 19.7 % (ref 12.0–46.0)
Lymphs Abs: 1.2 10*3/uL (ref 0.7–4.0)
MCHC: 34.4 g/dL (ref 30.0–36.0)
MCV: 85.8 fl (ref 78.0–100.0)
Monocytes Absolute: 0.6 10*3/uL (ref 0.1–1.0)
Monocytes Relative: 9.8 % (ref 3.0–12.0)
Neutro Abs: 3.6 10*3/uL (ref 1.4–7.7)
Neutrophils Relative %: 58.5 % (ref 43.0–77.0)
Platelets: 294 10*3/uL (ref 150.0–400.0)
RBC: 5.29 Mil/uL (ref 4.22–5.81)
RDW: 14.4 % (ref 11.5–15.5)
WBC: 6.2 10*3/uL (ref 4.0–10.5)

## 2019-08-29 LAB — LIPID PANEL
Cholesterol: 167 mg/dL (ref 0–200)
HDL: 38.3 mg/dL — ABNORMAL LOW (ref >39.00)
LDL Cholesterol: 110 mg/dL — ABNORMAL HIGH (ref 0–99)
NonHDL: 128.67
Total CHOL/HDL Ratio: 4
Triglycerides: 95 mg/dL (ref 0.0–149.0)
VLDL: 19 mg/dL (ref 0.0–40.0)

## 2019-08-29 LAB — HEMOGLOBIN A1C: Hgb A1c MFr Bld: 6 % (ref 4.6–6.5)

## 2019-08-29 NOTE — Progress Notes (Signed)
Phone: [phone]   Subjective:  Patient presents today for their annual physical. Chief complaint-noted.   See problem oriented charting- ROS- full  review of systems was completed and negative  except for: weight gain, some knee pain  The following were reviewed and entered/updated in epic: Past Medical History:  Diagnosis Date  . Allergy   . Asbestos exposure   . Basal cell carcinoma    skin of left ear  . BPH (benign prostatic hyperplasia)   . Cancer ([place])    basal cell CA (skin)  . Colitis 01/2011  . Colon polyp   . Deviated nasal septum    hx. of  . Diverticulosis   . DVT (deep venous thrombosis) ([place])   . GERD (gastroesophageal reflux disease)   . Hyperlipidemia   . Kidney stones   . Lipoma 12-24-11   multiple(present- lipoma 2-lt./4- rt arms/ back/ chest  . Liver cyst 10/11   4 simple cysts in liver  . 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 Prostate cancer ([place]) 12-24-11   dx. Prostate cancer after bx.  . Ulcerative colitis [facility])    Patient Active Problem List   Diagnosis Date Noted  . Lupus anticoagulant disorder ([place]) 04/30/2015    Priority: High  . History of DVT of lower extremity 08/31/2012    Priority: High  . History of pulmonary embolism 04/13/2012    Priority: High  . History of prostate cancer 05/14/2011    Priority: High  . Diverticulitis 05/19/2018    Priority: Medium  . Urinary incontinence 09/20/2014    Priority: Medium  . Hyperglycemia 05/12/2012    Priority: Medium  . GERD 10/04/2007    Priority: Medium  . Hyperlipidemia 03/08/2007    Priority: Medium  . Diverticulosis 03/22/2014    Priority: Low  . Erectile dysfunction 03/22/2014    Priority: Low  . Personal history of colonic polyps 08/31/2012    Priority: Low  . Basal cell carcinoma     Priority: Low  . Obesity 10/08/2011    Priority: Low  . 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 Kidney stone 06/02/2010    Priority:  Low  . Allergic rhinitis 10/04/2007    Priority: Low  . Elevated blood pressure reading 04/29/2016   Past Surgical History:  Procedure Laterality Date  . COLONOSCOPY    . CYSTOSCOPY  02/16/2012   Procedure: CYSTOSCOPY FLEXIBLE;  Surgeon: Bernestine Amass, MD;  Location: [facility];  Service: Urology;  Laterality: N/A;  FLEX CYSTO WITH REMOVAL OF STAPLES   . INGUINAL HERNIA REPAIR  1990's   double  . KNEE SURGERY  2007   right knee scope  . lipoma removals  1990's   multiple and multiple remains now  . LYMPH NODE DISSECTION  12/31/2011   Procedure: LYMPH NODE DISSECTION;  Surgeon: Bernestine Amass, MD;  Location: [facility];  Service: Urology;  Laterality: Bilateral;  Bilateral  . POLYPECTOMY    . ROBOT ASSISTED LAPAROSCOPIC RADICAL PROSTATECTOMY  12/31/2011   Procedure: ROBOTIC ASSISTED LAPAROSCOPIC RADICAL PROSTATECTOMY;  Surgeon: Bernestine Amass, MD;  Location: [facility];  Service: Urology;  Laterality: N/A;      . TONSILLECTOMY      Family History  Problem Relation Age of Onset  . Colon cancer Mother 25  . Colon cancer Father 37  . Prostate cancer Paternal Grandfather   . Esophageal cancer Paternal Grandfather   . Stomach cancer  Neg Hx   . Rectal cancer Neg Hx     Medications- reviewed and updated Current Outpatient Medications  Medication Sig Dispense Refill  . Ascorbic Acid (VITAMIN C) 1000 MG tablet Take 1,000 mg by mouth daily.    Marland Kitchen aspirin 325 MG tablet Take 325 mg by mouth daily.    Marland Kitchen co-enzyme Q-10 50 MG capsule Take 200 mg by mouth daily.    . Multiple Vitamins-Minerals (MULTIVITAMIN WITH MINERALS) tablet Take 1 tablet by mouth daily.    Marland Kitchen omeprazole (PRILOSEC) 20 MG capsule TAKE 1 CAPSULE EVERY DAY 90 capsule 1  . pravastatin (PRAVACHOL) 20 MG tablet TAKE 1 TABLET EVERY DAY 90 tablet 3   No current facility-administered medications for this visit.    Allergies-reviewed and updated Allergies  Allergen Reactions  . 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       Hobbies: travels fair amount has timeshare and also travels more than that, enjoys Haematologist, home repair      Physician roster   Delfin Edis - gastroenterology in past   Burney Gauze - hematology (released around 2016)   Dr. Risa Grill - urology   Dr. Herbert Deaner - Ophthalmology   Dr. [place] Crane- dermatology   Objective  Objective:  BP 120/78   Pulse 75   Temp 98.3 F (36.8 C) (Temporal)   Ht 6' (1.829 m)   Wt 254 lb 6.4 oz (115.4 kg)   SpO2 94%   BMI 34.50 kg/m  Gen: NAD, resting comfortably HEENT: Mask not removed due to covid 19. TM normal. Bridge of nose normal. Eyelids normal.  Neck: no thyromegaly or cervical lymphadenopathy  CV: RRR no murmurs rubs or gallops Lungs: CTAB no crackles, wheeze, rhonchi Abdomen: soft/nontender/nondistended/normal bowel sounds. No rebound or guarding.  Ext: no edema Skin: warm, dry, multiple mobile lipomas stable from prior Neuro: grossly normal, moves all extremities, PERRLA    Assessment and Plan  74 y.o. male presenting for annual physical.  Health Maintenance counseling: 1. Anticipatory guidance: Patient counseled regarding regular dental exams -q6 months encouraged- he goes one yearly, eye exams -yearly,  avoiding smoking and second hand smoke , limiting alcohol to 2 beverages per day - well under this.   2. Risk factor reduction:  Advised patient of need for regular exercise and diet rich and fruits and vegetables to reduce risk of heart attack and stroke. Exercise- recently started back at the gym. Diet-weight up 23 lbs- reports a lot of stress eating with major move from [place] to a condo- feels he can reverse this now.  Wt Readings from Last 3 Encounters:  08/29/19 254 lb 6.4 oz (115.4 kg)  05/19/18 231 lb 12.8 oz (105.1 kg)  04/20/18  234 lb 3.2 oz (106.2 kg)  3. Immunizations/screenings/ancillary studies-discussed Shingrix and COVID-19 vaccination. He does not want to get covid 19 vaccine at this point-  He will consider Immunization History  Administered Date(s) Administered  . Influenza Split 03/19/2011, 03/26/2012  . Influenza Whole 03/15/2008, 02/22/2009, 02/25/2010  . Influenza, High Dose Seasonal PF 02/28/2015, 04/09/2016, 03/11/2017, 05/19/2018  . Influenza,inj,Quad PF,6+ Mos 03/18/2013, 03/07/2014  . Pneumococcal Conjugate-13 04/30/2015  . Pneumococcal Polysaccharide-23 09/12/2013  . Td 11/01/2003, 09/12/2013  . Zoster 05/14/2011   4. Prostate cancer follow up - status post prostatectomy-follows regularly  with urology Dr. Louis Meckel- needs to get back in and encouraged him. He defers psa to urology Lab Results  Component Value Date   PSA 0.015 10/18/2018   PSA 0.00 (L) 10/23/2015   PSA 0.00 Repeated and verified X2. (L) 09/05/2013   5. Colon cancer screening - October 2019 with 5-year follow-up due to family history 6. Skin cancer screening-  Sees dermatology. advised regular sunscreen use. Denies worrisome, changing, or new skin lesions.  7.  Never smoker  Status of chronic or acute concerns   Lupus anticoagulant disorder (HCC)/History of pulmonary embolism/History of DVT of lower extremity-patient remains on aspirin 325 mg alone.  No recent issues-knows to watch for calf swelling/pain. Continue current meds  Hyperlipidemia, unspecified hyperlipidemia type-patient compliant with pravastatin 20 mg-update lipid panel today.  We discussed have newer guidelines recommend LDL under 70-he would like to work on lifestyle changes likely before increasing medicine Lab Results  Component Value Date   CHOL 157 11/04/2016   HDL 39.00 (L) 11/04/2016   LDLCALC 96 11/04/2016   TRIG 109.0 11/04/2016   CHOLHDL 4 11/04/2016    Hyperglycemia-I am concerned with weight gain that he may have developed diabetes.  Update A1c  today.  Would not diagnose unless had A1c over 7 or 2 consecutive readings over 6.4 Lab Results  Component Value Date   HGBA1C 6.1 11/04/2016   HGBA1C 6.0 09/20/2014   HGBA1C 5.8 03/07/2014    Gastroesophageal reflux disease, unspecified whether esophagitis present-compliant with omeprazole 20 mg with reasonable control.  We have never checked a B12 and we will do that today - well controlled Might be worth a trial of pepcid 20 mg twice a day with meals which has better long term sid eeffect profile than omeprazole  Recommended follow up: Return in about 6 months (around 02/29/2020) for follow up- or sooner if needed.   Lab/Order associations: fasting   ICD-10-CM   1. Preventative health care  Z00.00 CBC with Differential/Platelet    Comprehensive metabolic panel    Lipid panel    Hemoglobin A1c    Vitamin B12  2. Lupus anticoagulant disorder (HCC)  D68.62   3. History of pulmonary embolism  Z86.711   4. History of prostate cancer  Z85.46   5. History of DVT of lower extremity  Z86.718   6. Hyperlipidemia, unspecified hyperlipidemia type  E78.5 CBC with Differential/Platelet    Comprehensive metabolic panel    Lipid panel  7. Hyperglycemia  R73.9 Hemoglobin A1c  8. Gastroesophageal reflux disease, unspecified whether esophagitis present  K21.9   9. High risk medication use  Z79.899 Vitamin B12    No orders of the defined types were placed in this encounter.   Return precautions advised.  [name], MD

## 2019-08-29 NOTE — Progress Notes (Signed)
Phone: [phone]    Subjective:  Patient presents today for their annual wellness visit (subsequent )  Preventive Screening-Counseling & Management  Modifiable Risk Factors/behavioral risk assessment/psychosocial risk assessment Regular exercise: Member of [employer]. Goes 3 times a week for about 1 hr. Started 2 weeks ago.  Diet: Aware that he needs to work on portion control and snacking. Weight up 23 lbs since late 2019 - discussed reversing trend with healthy eating/regular exercise Wt Readings from Last 3 Encounters:  08/29/19 254 lb 6.4 oz (115.4 kg)  05/19/18 231 lb 12.8 oz (105.1 kg)  04/20/18 234 lb 3.2 oz (106.2 kg)  Smoking Status: Never Smoker Second Hand Smoking status: No smokers in home Alcohol intake: None per week. Only drinks on occasion every 2-3 weeks may have 1 beer or glass of wine   Cardiac risk factors:  advanced age (older than 51 for men, 104 for women)  treated Hyperlipidemia  no Hypertension  No diabetes. But monitoring blood sugar Lab Results  Component Value Date   HGBA1C 6.1 11/04/2016  Family History: none reported    Depression Screen/risk evaluation Risk factors: none.Marland Kitchen PHQ2 0  Depression screen [facility] 2/9 08/29/2019 02/23/2018 05/08/2017 11/04/2016 10/30/2015  Decreased Interest 0 0 0 0 0  Down, Depressed, Hopeless 0 0 0 0 0  PHQ - 2 Score 0 0 0 0 0  Altered sleeping 0 - 0 - -  Tired, decreased energy 0 - 0 - -  Change in appetite 0 - 0 - -  Feeling bad or failure about yourself  0 - 0 - -  Trouble concentrating 0 - 0 - -  Moving slowly or fidgety/restless 0 - 0 - -  Suicidal thoughts 0 - 0 - -  PHQ-9 Score 0 - 0 - -  Difficult doing work/chores Not difficult at all - Not difficult at all - -    Functional ability and level of safety Mobility assessment:  timed get up and go <12 seconds Activities of Daily Living- Independent in ADLs (toileting, bathing, dressing, transferring, eating) and in IADLs (shopping, housekeeping, managing own  medications, and handling finances) Home Safety: Loose rugs (no), smoke detectors (up to date), small pets (no), grab bars ( yes), stairs (none), life-alert system (would use phone) Hearing Difficulties: -patient declines Fall Risk: None  Fall Risk  08/29/2019 02/23/2018 05/08/2017 11/04/2016 10/30/2015  Falls in the past year? 0 No No No No  Number falls in past yr: 0 - - - -  Injury with Fall? 0 - - - -  Opioid use history:  no long term opioids use Self assessment of health status: "good"  Cognitive Testing             No reported trouble.   Mini cog: normal clock draw. 3/3 delayed recall. Normal test result   leader, season, table    List the Names of Other Physician/Practitioners you currently use: Patient Care Team: Marin Olp, MD as PCP - General (Family Medicine) Monna Fam, MD as Consulting Physician (Ophthalmology)- he will call as has switched to new eye doctor on [place] Rana Snare, MD as Consulting Physician (Urology) Ardis Hughs, MD as Consulting Physician (Urology)  Required Immunizations needed today:  Discussed COVID-19 vaccination and declines for now.  Discussed Shingrix vaccination and will consider at pharmacy  Immunization History  Administered Date(s) Administered  . Influenza Split 03/19/2011, 03/26/2012  . Influenza Whole 03/15/2008, 02/22/2009, 02/25/2010  . Influenza, High Dose Seasonal PF 02/28/2015, 04/09/2016, 03/11/2017, 05/19/2018  .  Influenza,inj,Quad PF,6+ Mos 03/18/2013, 03/07/2014  . Pneumococcal Conjugate-13 04/30/2015  . Pneumococcal Polysaccharide-23 09/12/2013  . Td 11/01/2003, 09/12/2013  . Zoster 05/14/2011   Health Maintenance  Topic Date Due  . Flu Shot  08/31/2019*  . Colon Cancer Screening  03/06/2023  . Tetanus Vaccine  09/13/2023  .  Hepatitis C: One time screening is recommended by Center for Disease Control  (CDC) for  adults born from [birth year] through 1963.   Completed  . Pneumonia vaccines  Completed  *Topic was  postponed. The date shown is not the original due date.    Screening tests-  1. Colon cancer screening- March 05, 2018 with 5-year follow-up due to family history 2. Lung Cancer screening- never smoker so not a candidate 3. Skin cancer screening- sees dermatology yearly 4. Prostate cancer screening-follows regularly with urology.  Status post prostatectomy Lab Results  Component Value Date   PSA 0.015 10/18/2018   PSA 0.00 (L) 10/23/2015   PSA 0.00 Repeated and verified X2. (L) 09/05/2013   The following were reviewed and entered/updated in epic if appropriate: Past Medical History:  Diagnosis Date  . Allergy   . Asbestos exposure   . Basal cell carcinoma    skin of left ear  . BPH (benign prostatic hyperplasia)   . Cancer ([place])    basal cell CA (skin)  . Colitis 01/2011  . Colon polyp   . Deviated nasal septum    hx. of  . Diverticulosis   . DVT (deep venous thrombosis) ([place])   . GERD (gastroesophageal reflux disease)   . Hyperlipidemia   . Kidney stones   . Lipoma 12-24-11   multiple(present- lipoma 2-lt./4- rt arms/ back/ chest  . Liver cyst 10/11   4 simple cysts in liver  . 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 Prostate cancer ([place]) 12-24-11   dx. Prostate cancer after bx.  . Ulcerative colitis [facility])    Patient Active Problem List   Diagnosis Date Noted  . Lupus anticoagulant disorder ([place]) 04/30/2015    Priority: High  . History of DVT of lower extremity 08/31/2012    Priority: High  . History of pulmonary embolism 04/13/2012    Priority: High  . History of prostate cancer 05/14/2011    Priority: High  . Diverticulitis 05/19/2018    Priority: Medium  . Urinary incontinence 09/20/2014    Priority: Medium  . Hyperglycemia 05/12/2012    Priority: Medium  . GERD 10/04/2007    Priority: Medium  . Hyperlipidemia 03/08/2007    Priority: Medium  . Diverticulosis 03/22/2014    Priority: Low  . Erectile dysfunction 03/22/2014     Priority: Low  . Personal history of colonic polyps 08/31/2012    Priority: Low  . Basal cell carcinoma     Priority: Low  . Obesity 10/08/2011    Priority: Low  . 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 Kidney stone 06/02/2010    Priority: Low  . Allergic rhinitis 10/04/2007    Priority: Low  . Elevated blood pressure reading 04/29/2016   Past Surgical History:  Procedure Laterality Date  . COLONOSCOPY    . CYSTOSCOPY  02/16/2012   Procedure: CYSTOSCOPY FLEXIBLE;  Surgeon: Bernestine Amass, MD;  Location[facility];  Service: Urology;  Laterality: N/A;  FLEX CYSTO WITH REMOVAL OF STAPLES   . INGUINAL HERNIA REPAIR  1990's   double  . KNEE SURGERY  2007   right knee scope  . lipoma removals  1990's   multiple and multiple remains now  . LYMPH NODE DISSECTION  12/31/2011   Procedure: LYMPH NODE DISSECTION;  Surgeon: Bernestine Amass, MD;  Location[facility];  Service: Urology;  Laterality: Bilateral;  Bilateral  . POLYPECTOMY    . ROBOT ASSISTED LAPAROSCOPIC RADICAL PROSTATECTOMY  12/31/2011   Procedure: ROBOTIC ASSISTED LAPAROSCOPIC RADICAL PROSTATECTOMY;  Surgeon: Bernestine Amass, MD;  Location: [facility];  Service: Urology;  Laterality: N/A;      . TONSILLECTOMY      Family History  Problem Relation Age of Onset  . Colon cancer Mother 91  . Colon cancer Father 7  . Prostate cancer Paternal Grandfather   . Esophageal cancer Paternal Grandfather   . Stomach cancer Neg Hx   . Rectal cancer Neg Hx     Medications- reviewed and updated Current Outpatient Medications  Medication Sig Dispense Refill  . Ascorbic Acid (VITAMIN C) 1000 MG tablet Take 1,000 mg by mouth daily.    Marland Kitchen aspirin 325 MG tablet Take 325 mg by mouth daily.    Marland Kitchen co-enzyme Q-10 50 MG capsule Take 200 mg by mouth daily.    . Multiple Vitamins-Minerals (MULTIVITAMIN WITH MINERALS) tablet Take 1 tablet by mouth daily.    Marland Kitchen omeprazole (PRILOSEC) 20 MG capsule TAKE 1 CAPSULE  EVERY DAY 90 capsule 1  . pravastatin (PRAVACHOL) 20 MG tablet TAKE 1 TABLET EVERY DAY 90 tablet 3   No current facility-administered medications for this visit.    Allergies-reviewed and updated Allergies  Allergen Reactions  . Nsaids     With high doses and extended period of time can cause colitis    Social History   Socioeconomic History  . Marital status: Married    Spouse name: Not on file  . Number of children: Not on file  . Years of education: Not on file  . Highest education level: Not on file  Occupational History  . Occupation: Retired    Fish farm manager: RETIRED  Tobacco Use  . Smoking status: Never Smoker  . Smokeless tobacco: Never Used  . Tobacco comment: never used tobacco  Substance and Sexual Activity  . Alcohol use: Yes    Alcohol/week: 0.0 - 1.0 standard drinks    Comment: occasional   . Drug use: No  . Sexual activity: Yes  Other Topics Concern  . 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       Hobbies: travels fair amount has timeshare and also travels more than that, enjoys Haematologist, home repair      Physician roster   Delfin Edis - gastroenterology in past   Burney Gauze - hematology (released around 2016)   Dr. Risa Grill - urology   Dr. Herbert Deaner - Ophthalmology   Dr. [place] Crane- dermatology   Social Determinants of Health   Financial Resource Strain:   . Difficulty of Paying Living Expenses:   Food Insecurity:   . Worried About Charity fundraiser in the Last Year:   . Arboriculturist in the Last Year:   Transportation Needs:   . Film/video editor (Medical):   Marland Kitchen Lack of Transportation (Non-Medical):   Physical Activity:   . Days of Exercise per Week:   . Minutes of Exercise per Session:   Stress:   . Feeling of Stress :   Social Connections:   .  Frequency of Communication with Friends and Family:   . Frequency of Social Gatherings with  Friends and Family:   . Attends Religious Services:   . Active Member of Clubs or Organizations:   . Attends Archivist Meetings:   Marland Kitchen Marital Status:       Objective:  BP 120/78   Pulse 75   Temp 98.3 F (36.8 C) (Temporal)   Ht 6' (1.829 m)   Wt 254 lb 6.4 oz (115.4 kg)   SpO2 94%   BMI 34.50 kg/m  Gen: NAD, resting comfortably   Assessment/Plan:  AWV completed 1. Educated, counseled and referred based on above elements 2. Educated, counseled and referred as appropriate for preventative needs 3. Discussed and documented a written plan for preventiative services and screenings with personalized health advice- After Visit Summary was given to patient which included this plan   Status of chronic or acute concerns  See physical from today  Recommended follow up: Return in about 6 months (around 02/29/2020) for follow up- or sooner if needed.   Lab/Order associations:   ICD-10-CM   1. Preventative health care  Z00.00    Return precautions advised.  [name], MD

## 2019-08-29 NOTE — Patient Instructions (Addendum)
  Mr. Nicholas Owen , Thank you for taking time to come for your Medicare Wellness Visit. I appreciate your ongoing commitment to your health goals. Please review the following plan we discussed and let me know if I can assist you in the future.   These are the goals we discussed: 1.  Continue recently started regular exercise 2.  Goal at least 10 pounds weight loss by 25-month follow-up 3. Please stop by lab before you go If you do not have mychart- we will call you about results within 5 business days of [place] receiving them.  If you have mychart- we will send your results within 3 business days of [place] receiving them.  If abnormal or we want to clarify a result, we will call or mychart you to make sure you receive the message.  If you have questions or concerns or don't hear within 5 business days, please send [place] a message or call us.   4.  Please check with your pharmacy to see if they have the shingrix vaccine. If they do- please get this immunization and update [place] by phone call or mychart with dates you receive the vaccine. If you got covid vaccine would need to be separated by 2 weeks on each side from shingles vaccine.  5. Call urology to schedule follow up 6. Might be worth a trial of pepcid 20 mg twice a day with meals which has better long term side effect profile than omeprazole. Also ok to wait on this until you've lost 10-15 lbs   This is a list of the screening recommended for you and due dates:  Health Maintenance  Topic Date Due  . Flu Shot  08/31/2019*  . Colon Cancer Screening  03/06/2023  . Tetanus Vaccine  09/13/2023  .  Hepatitis C: One time screening is recommended by Center for Disease Control  (CDC) for  adults born from [birth year] through 1963.   Completed  . Pneumonia vaccines  Completed  *Topic was postponed. The date shown is not the original due date.    Recommended follow up: Return in about 6 months (around 02/29/2020) for follow up- or sooner if needed.

## 2019-08-30 ENCOUNTER — Other Ambulatory Visit: Payer: Self-pay

## 2019-08-30 MED ORDER — ROSUVASTATIN CALCIUM 10 MG PO TABS: 10.0000 mg | ORAL_TABLET | Freq: Every day | ORAL | 3 refills | Status: DC

## 2019-09-14 ENCOUNTER — Encounter (HOSPITAL_BASED_OUTPATIENT_CLINIC_OR_DEPARTMENT_OTHER): Payer: Self-pay

## 2019-09-14 ENCOUNTER — Other Ambulatory Visit: Payer: Self-pay

## 2019-09-14 ENCOUNTER — Emergency Department (HOSPITAL_BASED_OUTPATIENT_CLINIC_OR_DEPARTMENT_OTHER): Payer: Medicare HMO

## 2019-09-14 ENCOUNTER — Inpatient Hospital Stay (HOSPITAL_BASED_OUTPATIENT_CLINIC_OR_DEPARTMENT_OTHER)
Admission: EM | Admit: 2019-09-14 | Discharge: 2019-09-17 | DRG: 339 | Disposition: A | Discharge status: Home / Self Care | Payer: Medicare HMO | Attending: Surgery | Admitting: Surgery

## 2019-09-14 ENCOUNTER — Emergency Department (HOSPITAL_COMMUNITY)
Admission: EM | Admit: 2019-09-14 | Discharge: 2019-09-14 | Disposition: A | Discharge status: Home / Self Care | Payer: Medicare HMO | Source: Home / Self Care

## 2019-09-14 ENCOUNTER — Encounter (HOSPITAL_COMMUNITY): Payer: Self-pay | Admitting: Emergency Medicine

## 2019-09-14 DIAGNOSIS — Z20822 Contact with and (suspected) exposure to covid-19: Secondary | ICD-10-CM | POA: Diagnosis present

## 2019-09-14 DIAGNOSIS — R0603 Acute respiratory distress: Secondary | ICD-10-CM | POA: Diagnosis present

## 2019-09-14 DIAGNOSIS — K219 Gastro-esophageal reflux disease without esophagitis: Secondary | ICD-10-CM | POA: Diagnosis present

## 2019-09-14 DIAGNOSIS — E785 Hyperlipidemia, unspecified: Secondary | ICD-10-CM | POA: Diagnosis present

## 2019-09-14 DIAGNOSIS — R1031 Right lower quadrant pain: Secondary | ICD-10-CM | POA: Diagnosis not present

## 2019-09-14 DIAGNOSIS — Z79899 Other long term (current) drug therapy: Secondary | ICD-10-CM | POA: Diagnosis not present

## 2019-09-14 DIAGNOSIS — Z8546 Personal history of malignant neoplasm of prostate: Secondary | ICD-10-CM

## 2019-09-14 DIAGNOSIS — Z9289 Personal history of other medical treatment: Secondary | ICD-10-CM

## 2019-09-14 DIAGNOSIS — Z7982 Long term (current) use of aspirin: Secondary | ICD-10-CM | POA: Diagnosis not present

## 2019-09-14 DIAGNOSIS — D6851 Activated protein C resistance: Secondary | ICD-10-CM | POA: Diagnosis present

## 2019-09-14 DIAGNOSIS — Z9079 Acquired absence of other genital organ(s): Secondary | ICD-10-CM | POA: Diagnosis not present

## 2019-09-14 DIAGNOSIS — Z03818 Encounter for observation for suspected exposure to other biological agents ruled out: Secondary | ICD-10-CM | POA: Diagnosis not present

## 2019-09-14 DIAGNOSIS — Z85828 Personal history of other malignant neoplasm of skin: Secondary | ICD-10-CM | POA: Diagnosis not present

## 2019-09-14 DIAGNOSIS — K353 Acute appendicitis with localized peritonitis, without perforation or gangrene: Principal | ICD-10-CM

## 2019-09-14 DIAGNOSIS — Z886 Allergy status to analgesic agent status: Secondary | ICD-10-CM

## 2019-09-14 DIAGNOSIS — Z800 Family history of malignant neoplasm of digestive organs: Secondary | ICD-10-CM

## 2019-09-14 DIAGNOSIS — Z8042 Family history of malignant neoplasm of prostate: Secondary | ICD-10-CM

## 2019-09-14 DIAGNOSIS — K3532 Acute appendicitis with perforation and localized peritonitis, without abscess: Secondary | ICD-10-CM | POA: Diagnosis present

## 2019-09-14 DIAGNOSIS — Z86711 Personal history of pulmonary embolism: Secondary | ICD-10-CM | POA: Diagnosis not present

## 2019-09-14 DIAGNOSIS — K3580 Unspecified acute appendicitis: Secondary | ICD-10-CM | POA: Diagnosis present

## 2019-09-14 DIAGNOSIS — R918 Other nonspecific abnormal finding of lung field: Secondary | ICD-10-CM | POA: Diagnosis not present

## 2019-09-14 LAB — CBC
HCT: 45.9 % (ref 39.0–52.0)
Hemoglobin: 15.6 g/dL (ref 13.0–17.0)
MCH: 29.1 pg (ref 26.0–34.0)
MCHC: 34 g/dL (ref 30.0–36.0)
MCV: 85.5 fL (ref 80.0–100.0)
Platelets: 305 10*3/uL (ref 150–400)
RBC: 5.37 MIL/uL (ref 4.22–5.81)
RDW: 13.2 % (ref 11.5–15.5)
WBC: 14.9 10*3/uL — ABNORMAL HIGH (ref 4.0–10.5)
nRBC: 0 % (ref 0.0–0.2)

## 2019-09-14 LAB — URINALYSIS, ROUTINE W REFLEX MICROSCOPIC
Bilirubin Urine: NEGATIVE
Glucose, UA: NEGATIVE mg/dL
Hgb urine dipstick: NEGATIVE
Ketones, ur: NEGATIVE mg/dL
Leukocytes,Ua: NEGATIVE
Nitrite: NEGATIVE
Protein, ur: NEGATIVE mg/dL
Specific Gravity, Urine: <1.005 — ABNORMAL LOW (ref 1.005–1.030)
pH: 6 (ref 5.0–8.0)

## 2019-09-14 LAB — COMPREHENSIVE METABOLIC PANEL
ALT: 23 U/L (ref 0–44)
AST: 18 U/L (ref 15–41)
Albumin: 4 g/dL (ref 3.5–5.0)
Alkaline Phosphatase: 71 U/L (ref 38–126)
Anion gap: 12 (ref 5–15)
BUN: 12 mg/dL (ref 8–23)
CO2: 23 mmol/L (ref 22–32)
Calcium: 8.8 mg/dL — ABNORMAL LOW (ref 8.9–10.3)
Chloride: 99 mmol/L (ref 98–111)
Creatinine, Ser: 0.93 mg/dL (ref 0.61–1.24)
GFR calc Af Amer: >60 mL/min (ref >60)
GFR calc non Af Amer: >60 mL/min (ref >60)
Glucose, Bld: 117 mg/dL — ABNORMAL HIGH (ref 70–99)
Potassium: 3.9 mmol/L (ref 3.5–5.1)
Sodium: 134 mmol/L — ABNORMAL LOW (ref 135–145)
Total Bilirubin: 2.4 mg/dL — ABNORMAL HIGH (ref 0.3–1.2)
Total Protein: 7.2 g/dL (ref 6.5–8.1)

## 2019-09-14 LAB — LIPASE, BLOOD: Lipase: 24 U/L (ref 11–51)

## 2019-09-14 LAB — RESPIRATORY PANEL BY RT PCR (FLU A&B, COVID)
Influenza A by PCR: NEGATIVE
Influenza B by PCR: NEGATIVE
SARS Coronavirus 2 by RT PCR: NEGATIVE

## 2019-09-14 MED ORDER — MORPHINE SULFATE (PF) 4 MG/ML IV SOLN
4.0000 mg | Freq: Once | INTRAVENOUS | Status: AC
  Administered 2019-09-14: 4 mg via INTRAVENOUS
  Filled 2019-09-14: qty 1

## 2019-09-14 MED ORDER — ONDANSETRON HCL 4 MG/2ML IJ SOLN
4.0000 mg | Freq: Once | INTRAMUSCULAR | Status: AC
  Administered 2019-09-14: 4 mg via INTRAVENOUS
  Filled 2019-09-14: qty 2

## 2019-09-14 MED ORDER — SODIUM CHLORIDE 0.9% FLUSH
3.0000 mL | Freq: Once | INTRAVENOUS | Status: DC
  Filled 2019-09-14: qty 3

## 2019-09-14 MED ORDER — IOHEXOL 300 MG/ML  SOLN
100.0000 mL | Freq: Once | INTRAMUSCULAR | Status: AC | PRN
  Administered 2019-09-14: 100 mL via INTRAVENOUS

## 2019-09-14 MED ORDER — MORPHINE SULFATE (PF) 4 MG/ML IV SOLN
8.0000 mg | Freq: Once | INTRAVENOUS | Status: AC
  Administered 2019-09-14: 8 mg via INTRAVENOUS
  Filled 2019-09-14: qty 2

## 2019-09-14 MED ORDER — MORPHINE SULFATE (PF) 4 MG/ML IV SOLN
INTRAVENOUS | Status: AC
  Filled 2019-09-14: qty 1

## 2019-09-14 MED ORDER — SODIUM CHLORIDE 0.9 % IV SOLN
INTRAVENOUS | Status: DC | PRN
  Administered 2019-09-14: 1000 mL via INTRAVENOUS

## 2019-09-14 MED ORDER — PIPERACILLIN-TAZOBACTAM 3.375 G IVPB 30 MIN
3.3750 g | Freq: Once | INTRAVENOUS | Status: AC
  Administered 2019-09-14: 3.375 g via INTRAVENOUS
  Filled 2019-09-14 (×2): qty 50

## 2019-09-14 MED ORDER — SODIUM CHLORIDE 0.9 % IV BOLUS
1000.0000 mL | Freq: Once | INTRAVENOUS | Status: AC
  Administered 2019-09-14: 1000 mL via INTRAVENOUS

## 2019-09-14 NOTE — ED Notes (Signed)
Patient to CT.

## 2019-09-14 NOTE — ED Provider Notes (Signed)
11:10 PM  Transferred from [facility] to see general surgery for acute appendicitis.  Has received Zosyn.  COVID negative.  Ninfa Linden with general surgery has been consulted by previous provider.  Will page to let him know patient is here in ED.  He is NPO.  Will give another dose of Morphine for pain control.  I reviewed all nursing notes and pertinent previous records as available.  I have interpreted any EKGs, lab and urine results, imaging (as available).    Jionni Helming, Delice Bison, DO 09/14/19 [id number]

## 2019-09-14 NOTE — ED Triage Notes (Signed)
Patient reports RLQ pain since yesterday with one episode of vomiting yesterday. Hx diverticulitis. Denies blood in stool.

## 2019-09-14 NOTE — ED Provider Notes (Signed)
[name] EMERGENCY DEPARTMENT Provider Note   CSN: [id number] Arrival date & time: 09/14/19  1918     History Chief Complaint  Patient presents with  . Abdominal Pain    Nicholas Owen is a 74 y.o. male.  74 yo M with a chief complaints of right lower quadrant abdominal pain.  Is been going on for about 48 hours.  Patient is not been able to eat and drink as he does not feel well.  Has had some nausea but no significant vomiting.  He states it is worse with going over bumps in the car or movement or palpation.  The history is provided by the patient.  Illness Severity:  Moderate Onset quality:  Sudden Duration:  2 days Timing:  Constant Progression:  Worsening Chronicity:  New Associated symptoms: abdominal pain and nausea   Associated symptoms: no chest pain, no congestion, no diarrhea, no fever, no headaches, no myalgias, no rash, no shortness of breath and no vomiting        Past Medical History:  Diagnosis Date  . Allergy   . Asbestos exposure   . Basal cell carcinoma    skin of left ear  . BPH (benign prostatic hyperplasia)   . Cancer ([place])    basal cell CA (skin)  . Colitis 01/2011  . Colon polyp   . Deviated nasal septum    hx. of  . Diverticulosis   . DVT (deep venous thrombosis) ([place])   . GERD (gastroesophageal reflux disease)   . Hyperlipidemia   . Kidney stones   . Lipoma 12-24-11   multiple(present- lipoma 2-lt./4- rt arms/ back/ chest  . Liver cyst 10/11   4 simple cysts in liver  . 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 Prostate cancer ([place]) 12-24-11   dx. Prostate cancer after bx.  . Ulcerative colitis [facility])     Patient Active Problem List   Diagnosis Date Noted  . Diverticulitis 05/19/2018  . Elevated blood pressure reading 04/29/2016  . Lupus anticoagulant disorder ([place]) 04/30/2015  . Urinary incontinence 09/20/2014  . Diverticulosis 03/22/2014  . Erectile dysfunction 03/22/2014  . History  of DVT of lower extremity 08/31/2012  . Personal history of colonic polyps 08/31/2012  . Hyperglycemia 05/12/2012  . History of pulmonary embolism 04/13/2012  . Basal cell carcinoma   . Obesity 10/08/2011  . History of prostate cancer 05/14/2011  . 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 Kidney stone 06/02/2010  . Allergic rhinitis 10/04/2007  . GERD 10/04/2007  . Hyperlipidemia 03/08/2007    Past Surgical History:  Procedure Laterality Date  . COLONOSCOPY    . CYSTOSCOPY  02/16/2012   Procedure: CYSTOSCOPY FLEXIBLE;  Surgeon: Bernestine Amass, MD;  Location: [facility];  Service: Urology;  Laterality: N/A;  FLEX CYSTO WITH REMOVAL OF STAPLES   . INGUINAL HERNIA REPAIR  1990's   double  . KNEE SURGERY  2007   right knee scope  . lipoma removals  1990's   multiple and multiple remains now  . LYMPH NODE DISSECTION  12/31/2011   Procedure: LYMPH NODE DISSECTION;  Surgeon: Bernestine Amass, MD;  Location: [facility];  Service: Urology;  Laterality: Bilateral;  Bilateral  . POLYPECTOMY    . ROBOT ASSISTED LAPAROSCOPIC RADICAL PROSTATECTOMY  12/31/2011   Procedure: ROBOTIC ASSISTED LAPAROSCOPIC RADICAL PROSTATECTOMY;  Surgeon: Bernestine Amass, MD;  Location: [facility];  Service: Urology;  Laterality: N/A;      .  TONSILLECTOMY         Family History  Problem Relation Age of Onset  . Colon cancer Mother 49  . Colon cancer Father 24  . Prostate cancer Paternal Grandfather   . Esophageal cancer Paternal Grandfather   . Stomach cancer Neg Hx   . Rectal cancer Neg Hx     Social History   Tobacco Use  . Smoking status: Never Smoker  . Smokeless tobacco: Never Used  . Tobacco comment: never used tobacco  Substance Use Topics  . Alcohol use: Yes    Comment: occasional   . Drug use: No    Home Medications Prior to Admission medications   Medication Sig Start Date End Date Taking? Authorizing Provider  Ascorbic Acid (VITAMIN C) 1000 MG tablet Take 1,000 mg by  mouth daily.    [provider]  aspirin 325 MG tablet Take 325 mg by mouth daily.    [provider]  co-enzyme Q-10 50 MG capsule Take 200 mg by mouth daily.    [provider]  Multiple Vitamins-Minerals (MULTIVITAMIN WITH MINERALS) tablet Take 1 tablet by mouth daily.    [provider]  omeprazole (PRILOSEC) 20 MG capsule TAKE 1 CAPSULE EVERY DAY 07/21/19   Marin Olp, MD  rosuvastatin (CRESTOR) 10 MG tablet Take 1 tablet (10 mg total) by mouth daily. 08/30/19   Marin Olp, MD    Allergies    Nsaids  Review of Systems   Review of Systems  Constitutional: Negative for chills and fever.  HENT: Negative for congestion and facial swelling.   Eyes: Negative for discharge and visual disturbance.  Respiratory: Negative for shortness of breath.   Cardiovascular: Negative for chest pain and palpitations.  Gastrointestinal: Positive for abdominal pain and nausea. Negative for diarrhea and vomiting.  Musculoskeletal: Negative for arthralgias and myalgias.  Skin: Negative for color change and rash.  Neurological: Negative for tremors, syncope and headaches.  Psychiatric/Behavioral: Negative for confusion and dysphoric mood.    Physical Exam Updated Vital Signs BP 133/84 (BP Location: Right Arm)   Pulse 79   Temp 98.4 F (36.9 C) (Oral)   Resp 18   Wt 117.6 kg   SpO2 93%   BMI 35.16 kg/m   Physical Exam Vitals and nursing note reviewed.  Constitutional:      Appearance: He is well-developed.  HENT:     Head: Normocephalic and atraumatic.  Eyes:     Pupils: Pupils are equal, round, and reactive to light.  Neck:     Vascular: No JVD.  Cardiovascular:     Rate and Rhythm: Normal rate and regular rhythm.     Heart sounds: No murmur. No friction rub. No gallop.   Pulmonary:     Effort: No respiratory distress.     Breath sounds: No wheezing.  Abdominal:     General: There is no distension.     Tenderness: There is abdominal  tenderness in the right lower quadrant. There is no guarding or rebound.     Comments: Pain to the right lower quadrant versus McBurney's point.  Guarding as well.  No obvious rebound.  Negative Rovsing's negative obturator negative psoas  Musculoskeletal:        General: Normal range of motion.     Cervical back: Normal range of motion and neck supple.  Skin:    Coloration: Skin is not pale.     Findings: No rash.  Neurological:     Mental Status: He is  alert and oriented to person, place, and time.  Psychiatric:        Behavior: Behavior normal.     ED Results / Procedures / Treatments   Labs (all labs ordered are listed, but only abnormal results are displayed) Labs Reviewed  COMPREHENSIVE METABOLIC PANEL - Abnormal; Notable for the following components:      Result Value   Sodium 134 (*)    Glucose, Bld 117 (*)    Calcium 8.8 (*)    Total Bilirubin 2.4 (*)    All other components within normal limits  CBC - Abnormal; Notable for the following components:   WBC 14.9 (*)    All other components within normal limits  RESPIRATORY PANEL BY RT PCR (FLU A&B, COVID)  LIPASE, BLOOD  URINALYSIS, ROUTINE W REFLEX MICROSCOPIC    EKG None  Radiology CT ABDOMEN PELVIS W CONTRAST  Result Date: 09/14/2019 CLINICAL DATA:  Right lower quadrant pain EXAM: CT ABDOMEN AND PELVIS WITH CONTRAST TECHNIQUE: Multidetector CT imaging of the abdomen and pelvis was performed using the standard protocol following bolus administration of intravenous contrast. CONTRAST:  154mL OMNIPAQUE IOHEXOL 300 MG/ML  SOLN COMPARISON:  August 2018 FINDINGS: Lower chest: Bibasilar atelectasis. Partially imaged focally prominent pleural fat along the lateral chest wall. Moderate hiatal hernia. Hepatobiliary: Too small to characterize hypoattenuating lesion of the right and left lobes of the liver. Small cyst of the caudate. Single subcentimeter gallbladder calculus is again noted. No biliary dilatation. Pancreas:  Unremarkable. Spleen: Numerous calcified granulomas are again noted. Otherwise unremarkable. Adrenals/Urinary Tract: Adrenals are unremarkable. Bilateral renal cysts and too small to characterize hypoattenuating lesions. Bladder is poorly distended but otherwise unremarkable. Stomach/Bowel: Stomach is unremarkable apart from hiatal hernia. Bowel is normal in caliber. There is distal colonic diverticulosis. The appendix is distended particularly more distally beyond a subcentimeter calculus. There is surrounding fat infiltration. Vascular/Lymphatic: Aortic atherosclerosis. No enlarged abdominal or pelvic lymph nodes. Reproductive: Unremarkable. Other: No abscess. Musculoskeletal: Degenerative changes of the spine. No acute osseous abnormality. IMPRESSION: Acute appendicitis.  No abscess. Chronic findings detailed above. These results were called by telephone at the time of interpretation on 09/14/2019 at 9:15 pm to provider Lalanya Rufener , who verbally acknowledged these results. Electronically Signed   By: Macy Mis M.D.   On: 09/14/2019 21:15    Procedures Procedures (including critical care time)  Medications Ordered in ED Medications  sodium chloride flush (NS) 0.9 % injection 3 mL (3 mLs Intravenous Not Given 09/14/19 2022)  piperacillin-tazobactam (ZOSYN) IVPB 3.375 g (has no administration in time range)  morphine 4 MG/ML injection 4 mg (4 mg Intravenous Given 09/14/19 2043)  ondansetron (ZOFRAN) injection 4 mg (4 mg Intravenous Given 09/14/19 2034)  sodium chloride 0.9 % bolus 1,000 mL (1,000 mLs Intravenous New Bag/Given 09/14/19 2037)  iohexol (OMNIPAQUE) 300 MG/ML solution 100 mL (100 mLs Intravenous Contrast Given 09/14/19 2053)    ED Course  I have reviewed the triage vital signs and the nursing notes.  Pertinent labs & imaging results that were available during my care of the patient were reviewed by me and considered in my medical decision making (see chart for details).    MDM  Rules/Calculators/A&P                      74 yo M with a chief complaint of right lower quadrant abdominal pain.  Going on for about 48 hours.  Concern for acute appendicitis CT scan consistent with the same.  Has a leukocytosis of 14,000.  Will start on antibiotics.  Discussed with general surgery.  Dr. Ninfa Linden recommended doing a rapid Covid test and sending him ED to ED over [facility].  I discussed the case with Dr. Roslynn Amble who accepted the patient in transfer.  The patients results and plan were reviewed and discussed.   Any x-rays performed were independently reviewed by myself.   Differential diagnosis were considered with the presenting HPI.  Medications  sodium chloride flush (NS) 0.9 % injection 3 mL (3 mLs Intravenous Not Given 09/14/19 2022)  piperacillin-tazobactam (ZOSYN) IVPB 3.375 g (has no administration in time range)  morphine 4 MG/ML injection 4 mg (4 mg Intravenous Given 09/14/19 2043)  ondansetron (ZOFRAN) injection 4 mg (4 mg Intravenous Given 09/14/19 2034)  sodium chloride 0.9 % bolus 1,000 mL (1,000 mLs Intravenous New Bag/Given 09/14/19 2037)  iohexol (OMNIPAQUE) 300 MG/ML solution 100 mL (100 mLs Intravenous Contrast Given 09/14/19 2053)    Vitals:   09/14/19 1939 09/14/19 1941 09/14/19 2105  BP: 127/85  133/84  Pulse: 86  79  Resp: 18  18  Temp: 98.4 F (36.9 C)    TempSrc: Oral    SpO2: 93%  93%  Weight:  117.6 kg     Final diagnoses:  Acute appendicitis with localized peritonitis, without perforation, abscess, or gangrene    Admission/ observation were discussed with the admitting physician, patient and/or family and they are comfortable with the plan.   Final Clinical Impression(s) / ED Diagnoses Final diagnoses:  Acute appendicitis with localized peritonitis, without perforation, abscess, or gangrene    Rx / DC Orders ED Discharge Orders    None       Deno Etienne, DO 09/14/19 2129

## 2019-09-14 NOTE — ED Notes (Signed)
Called and spoke with Levada Dy, charge nurse at [facility] regarding sending pt ED to ED transfer

## 2019-09-14 NOTE — ED Triage Notes (Signed)
Pt c/o right side abd pain started yesterday-vomited x 1-denies diarrhea and constipation-states feels similar to diverticulitis pain hx-NAD-steady gait-[facility] ED [place]

## 2019-09-15 ENCOUNTER — Observation Stay (HOSPITAL_COMMUNITY): Payer: Medicare HMO | Admitting: Certified Registered Nurse Anesthetist

## 2019-09-15 ENCOUNTER — Inpatient Hospital Stay (HOSPITAL_COMMUNITY): Payer: Medicare HMO

## 2019-09-15 ENCOUNTER — Encounter (HOSPITAL_COMMUNITY): Admission: EM | Disposition: A | Discharge status: Home / Self Care | Payer: Self-pay | Source: Home / Self Care

## 2019-09-15 ENCOUNTER — Encounter (HOSPITAL_COMMUNITY): Payer: Self-pay | Admitting: Surgery

## 2019-09-15 DIAGNOSIS — Z20822 Contact with and (suspected) exposure to covid-19: Secondary | ICD-10-CM | POA: Diagnosis present

## 2019-09-15 DIAGNOSIS — Z79899 Other long term (current) drug therapy: Secondary | ICD-10-CM | POA: Diagnosis not present

## 2019-09-15 DIAGNOSIS — K3580 Unspecified acute appendicitis: Secondary | ICD-10-CM | POA: Diagnosis present

## 2019-09-15 DIAGNOSIS — Z8546 Personal history of malignant neoplasm of prostate: Secondary | ICD-10-CM | POA: Diagnosis not present

## 2019-09-15 DIAGNOSIS — Z7982 Long term (current) use of aspirin: Secondary | ICD-10-CM | POA: Diagnosis not present

## 2019-09-15 DIAGNOSIS — K219 Gastro-esophageal reflux disease without esophagitis: Secondary | ICD-10-CM | POA: Diagnosis present

## 2019-09-15 DIAGNOSIS — K3532 Acute appendicitis with perforation and localized peritonitis, without abscess: Secondary | ICD-10-CM | POA: Diagnosis present

## 2019-09-15 DIAGNOSIS — Z85828 Personal history of other malignant neoplasm of skin: Secondary | ICD-10-CM | POA: Diagnosis not present

## 2019-09-15 DIAGNOSIS — Z86711 Personal history of pulmonary embolism: Secondary | ICD-10-CM | POA: Diagnosis not present

## 2019-09-15 DIAGNOSIS — E785 Hyperlipidemia, unspecified: Secondary | ICD-10-CM | POA: Diagnosis present

## 2019-09-15 DIAGNOSIS — Z9079 Acquired absence of other genital organ(s): Secondary | ICD-10-CM | POA: Diagnosis not present

## 2019-09-15 DIAGNOSIS — D6851 Activated protein C resistance: Secondary | ICD-10-CM | POA: Diagnosis present

## 2019-09-15 DIAGNOSIS — Z800 Family history of malignant neoplasm of digestive organs: Secondary | ICD-10-CM | POA: Diagnosis not present

## 2019-09-15 DIAGNOSIS — R0603 Acute respiratory distress: Secondary | ICD-10-CM | POA: Diagnosis present

## 2019-09-15 DIAGNOSIS — Z8042 Family history of malignant neoplasm of prostate: Secondary | ICD-10-CM | POA: Diagnosis not present

## 2019-09-15 DIAGNOSIS — Z886 Allergy status to analgesic agent status: Secondary | ICD-10-CM | POA: Diagnosis not present

## 2019-09-15 HISTORY — PX: LAPAROSCOPIC APPENDECTOMY: SHX408

## 2019-09-15 LAB — SURGICAL PCR SCREEN
MRSA, PCR: NEGATIVE
Staphylococcus aureus: NEGATIVE

## 2019-09-15 SURGERY — APPENDECTOMY, LAPAROSCOPIC
Anesthesia: General | Site: Abdomen

## 2019-09-15 MED ORDER — ALBUTEROL SULFATE HFA 108 (90 BASE) MCG/ACT IN AERS
INHALATION_SPRAY | RESPIRATORY_TRACT | Status: AC
  Filled 2019-09-15: qty 6.7

## 2019-09-15 MED ORDER — KETOROLAC TROMETHAMINE 15 MG/ML IJ SOLN
15.0000 mg | Freq: Four times a day (QID) | INTRAMUSCULAR | Status: DC | PRN
  Administered 2019-09-15: 15 mg via INTRAVENOUS
  Filled 2019-09-15: qty 1

## 2019-09-15 MED ORDER — FENTANYL CITRATE (PF) 250 MCG/5ML IJ SOLN
INTRAMUSCULAR | Status: AC
  Filled 2019-09-15: qty 5

## 2019-09-15 MED ORDER — MIDAZOLAM HCL 5 MG/5ML IJ SOLN
INTRAMUSCULAR | Status: DC | PRN
  Administered 2019-09-15: 1 mg via INTRAVENOUS
  Administered 2019-09-15: .5 mg via INTRAVENOUS

## 2019-09-15 MED ORDER — DIPHENHYDRAMINE HCL 12.5 MG/5ML PO ELIX: 12.5000 mg | ORAL_SOLUTION | Freq: Four times a day (QID) | ORAL | Status: DC | PRN

## 2019-09-15 MED ORDER — SUGAMMADEX SODIUM 500 MG/5ML IV SOLN
INTRAVENOUS | Status: AC
  Filled 2019-09-15: qty 5

## 2019-09-15 MED ORDER — BUPIVACAINE-EPINEPHRINE (PF) 0.5% -1:200000 IJ SOLN
INTRAMUSCULAR | Status: DC | PRN
  Administered 2019-09-15: 10 mL

## 2019-09-15 MED ORDER — METRONIDAZOLE IN NACL 5-0.79 MG/ML-% IV SOLN
500.0000 mg | INTRAVENOUS | Status: AC
  Administered 2019-09-15: 500 mg via INTRAVENOUS
  Filled 2019-09-15: qty 100

## 2019-09-15 MED ORDER — DEXAMETHASONE SODIUM PHOSPHATE 10 MG/ML IJ SOLN
INTRAMUSCULAR | Status: DC | PRN
  Administered 2019-09-15: 8 mg via INTRAVENOUS

## 2019-09-15 MED ORDER — PROPOFOL 10 MG/ML IV BOLUS
INTRAVENOUS | Status: DC | PRN
  Administered 2019-09-15: 150 mg via INTRAVENOUS

## 2019-09-15 MED ORDER — ALBUMIN HUMAN 5 % IV SOLN
INTRAVENOUS | Status: AC
  Filled 2019-09-15: qty 500

## 2019-09-15 MED ORDER — SODIUM CHLORIDE 0.9 % IV SOLN: 2.0000 g | INTRAVENOUS | Status: AC

## 2019-09-15 MED ORDER — MIDAZOLAM HCL 2 MG/2ML IJ SOLN
INTRAMUSCULAR | Status: AC
  Filled 2019-09-15: qty 2

## 2019-09-15 MED ORDER — 0.9 % SODIUM CHLORIDE (POUR BTL) OPTIME
TOPICAL | Status: DC | PRN
  Administered 2019-09-15: 12:00:00 1000 mL

## 2019-09-15 MED ORDER — ONDANSETRON HCL 4 MG/2ML IJ SOLN: 4.0000 mg | Freq: Four times a day (QID) | INTRAMUSCULAR | Status: DC | PRN

## 2019-09-15 MED ORDER — ENOXAPARIN SODIUM 40 MG/0.4ML ~~LOC~~ SOLN: 40.0000 mg | SUBCUTANEOUS | Status: DC

## 2019-09-15 MED ORDER — ENOXAPARIN SODIUM 40 MG/0.4ML ~~LOC~~ SOLN
40.0000 mg | Freq: Once | SUBCUTANEOUS | Status: AC
  Administered 2019-09-15: 40 mg via SUBCUTANEOUS
  Filled 2019-09-15: qty 0.4

## 2019-09-15 MED ORDER — PHENYLEPHRINE 40 MCG/ML (10ML) SYRINGE FOR IV PUSH (FOR BLOOD PRESSURE SUPPORT)
PREFILLED_SYRINGE | INTRAVENOUS | Status: DC | PRN
  Administered 2019-09-15: 80 ug via INTRAVENOUS

## 2019-09-15 MED ORDER — ROCURONIUM BROMIDE 10 MG/ML (PF) SYRINGE
PREFILLED_SYRINGE | INTRAVENOUS | Status: AC
  Filled 2019-09-15: qty 10

## 2019-09-15 MED ORDER — LIDOCAINE 2% (20 MG/ML) 5 ML SYRINGE
INTRAMUSCULAR | Status: AC
  Filled 2019-09-15: qty 5

## 2019-09-15 MED ORDER — KETAMINE HCL 10 MG/ML IJ SOLN
INTRAMUSCULAR | Status: DC | PRN
  Administered 2019-09-15: 30 mg via INTRAVENOUS

## 2019-09-15 MED ORDER — DEXAMETHASONE SODIUM PHOSPHATE 10 MG/ML IJ SOLN
INTRAMUSCULAR | Status: AC
  Filled 2019-09-15: qty 1

## 2019-09-15 MED ORDER — ACETAMINOPHEN 10 MG/ML IV SOLN
INTRAVENOUS | Status: AC
  Filled 2019-09-15: qty 100

## 2019-09-15 MED ORDER — GABAPENTIN 300 MG PO CAPS
300.0000 mg | ORAL_CAPSULE | ORAL | Status: AC
  Administered 2019-09-15: 300 mg via ORAL
  Filled 2019-09-15: qty 1

## 2019-09-15 MED ORDER — CHLORHEXIDINE GLUCONATE CLOTH 2 % EX PADS: 6.0000 | MEDICATED_PAD | Freq: Once | CUTANEOUS | Status: DC

## 2019-09-15 MED ORDER — FENTANYL CITRATE (PF) 250 MCG/5ML IJ SOLN
INTRAMUSCULAR | Status: DC | PRN
  Administered 2019-09-15 (×3): 50 ug via INTRAVENOUS

## 2019-09-15 MED ORDER — FENTANYL CITRATE (PF) 100 MCG/2ML IJ SOLN
25.0000 ug | INTRAMUSCULAR | Status: DC | PRN
  Administered 2019-09-15 (×2): 25 ug via INTRAVENOUS

## 2019-09-15 MED ORDER — CHLORHEXIDINE GLUCONATE CLOTH 2 % EX PADS
6.0000 | MEDICATED_PAD | Freq: Every day | CUTANEOUS | Status: DC
  Administered 2019-09-15 – 2019-09-16 (×2): 6 via TOPICAL

## 2019-09-15 MED ORDER — PROMETHAZINE HCL 25 MG/ML IJ SOLN: 6.2500 mg | INTRAMUSCULAR | Status: DC | PRN

## 2019-09-15 MED ORDER — DIPHENHYDRAMINE HCL 50 MG/ML IJ SOLN: 12.5000 mg | Freq: Four times a day (QID) | INTRAMUSCULAR | Status: DC | PRN

## 2019-09-15 MED ORDER — ENSURE PRE-SURGERY PO LIQD
296.0000 mL | Freq: Once | ORAL | Status: DC
  Filled 2019-09-15: qty 296

## 2019-09-15 MED ORDER — ACETAMINOPHEN 10 MG/ML IV SOLN
1000.0000 mg | Freq: Four times a day (QID) | INTRAVENOUS | Status: AC
  Administered 2019-09-15: 1000 mg via INTRAVENOUS
  Filled 2019-09-15: qty 100

## 2019-09-15 MED ORDER — ONDANSETRON 4 MG PO TBDP: 4.0000 mg | ORAL_TABLET | Freq: Four times a day (QID) | ORAL | Status: DC | PRN

## 2019-09-15 MED ORDER — FENTANYL CITRATE (PF) 100 MCG/2ML IJ SOLN
INTRAMUSCULAR | Status: AC
  Filled 2019-09-15: qty 4

## 2019-09-15 MED ORDER — PHENYLEPHRINE HCL-NACL 10-0.9 MG/250ML-% IV SOLN
INTRAVENOUS | Status: DC | PRN
  Administered 2019-09-15: 35 ug/min via INTRAVENOUS

## 2019-09-15 MED ORDER — OXYCODONE HCL 5 MG PO TABS: 5.0000 mg | ORAL_TABLET | ORAL | Status: DC | PRN

## 2019-09-15 MED ORDER — POTASSIUM CHLORIDE IN NACL 20-0.9 MEQ/L-% IV SOLN
INTRAVENOUS | Status: DC
  Filled 2019-09-15: qty 1000

## 2019-09-15 MED ORDER — METHOCARBAMOL 500 MG PO TABS: 500.0000 mg | ORAL_TABLET | Freq: Four times a day (QID) | ORAL | Status: DC | PRN

## 2019-09-15 MED ORDER — CHLORHEXIDINE GLUCONATE CLOTH 2 % EX PADS
6.0000 | MEDICATED_PAD | Freq: Once | CUTANEOUS | Status: AC
  Administered 2019-09-15: 6 via TOPICAL

## 2019-09-15 MED ORDER — ENOXAPARIN SODIUM 40 MG/0.4ML ~~LOC~~ SOLN
40.0000 mg | SUBCUTANEOUS | Status: DC
  Administered 2019-09-16: 40 mg via SUBCUTANEOUS
  Filled 2019-09-15 (×2): qty 0.4

## 2019-09-15 MED ORDER — ACETAMINOPHEN 500 MG PO TABS
1000.0000 mg | ORAL_TABLET | ORAL | Status: AC
  Administered 2019-09-15: 1000 mg via ORAL
  Filled 2019-09-15: qty 2

## 2019-09-15 MED ORDER — MORPHINE SULFATE (PF) 2 MG/ML IV SOLN
1.0000 mg | INTRAVENOUS | Status: DC | PRN
  Administered 2019-09-15: 3 mg via INTRAVENOUS
  Administered 2019-09-15: 2 mg via INTRAVENOUS
  Administered 2019-09-15 (×2): 3 mg via INTRAVENOUS
  Filled 2019-09-15: qty 1
  Filled 2019-09-15 (×4): qty 2

## 2019-09-15 MED ORDER — PIPERACILLIN-TAZOBACTAM 3.375 G IVPB
3.3750 g | Freq: Three times a day (TID) | INTRAVENOUS | Status: DC
  Administered 2019-09-15 – 2019-09-17 (×5): 3.375 g via INTRAVENOUS
  Filled 2019-09-15 (×5): qty 50

## 2019-09-15 MED ORDER — POLYETHYLENE GLYCOL 3350 17 G PO PACK: 17.0000 g | PACK | Freq: Every day | ORAL | Status: DC | PRN

## 2019-09-15 MED ORDER — ALBUTEROL SULFATE HFA 108 (90 BASE) MCG/ACT IN AERS
INHALATION_SPRAY | RESPIRATORY_TRACT | Status: DC | PRN
  Administered 2019-09-15: 5 via RESPIRATORY_TRACT

## 2019-09-15 MED ORDER — KETOROLAC TROMETHAMINE 15 MG/ML IJ SOLN
INTRAMUSCULAR | Status: DC | PRN
  Administered 2019-09-15: 15 mg via INTRAVENOUS

## 2019-09-15 MED ORDER — SODIUM CHLORIDE 0.9 % IV SOLN
INTRAVENOUS | Status: AC
  Filled 2019-09-15: qty 20

## 2019-09-15 MED ORDER — SUGAMMADEX SODIUM 200 MG/2ML IV SOLN
INTRAVENOUS | Status: DC | PRN
  Administered 2019-09-15: 300 mg via INTRAVENOUS

## 2019-09-15 MED ORDER — BUPIVACAINE-EPINEPHRINE 0.5% -1:200000 IJ SOLN
INTRAMUSCULAR | Status: AC
  Filled 2019-09-15: qty 1

## 2019-09-15 MED ORDER — PIPERACILLIN-TAZOBACTAM 3.375 G IVPB 30 MIN: 3.3750 g | Freq: Three times a day (TID) | INTRAVENOUS | Status: DC

## 2019-09-15 MED ORDER — ORAL CARE MOUTH RINSE
15.0000 mL | Freq: Two times a day (BID) | OROMUCOSAL | Status: DC
  Administered 2019-09-15 – 2019-09-16 (×3): 15 mL via OROMUCOSAL

## 2019-09-15 MED ORDER — PROPOFOL 10 MG/ML IV BOLUS
INTRAVENOUS | Status: AC
  Filled 2019-09-15: qty 20

## 2019-09-15 MED ORDER — LIDOCAINE 2% (20 MG/ML) 5 ML SYRINGE
INTRAMUSCULAR | Status: DC | PRN
  Administered 2019-09-15: 100 mg via INTRAVENOUS

## 2019-09-15 MED ORDER — ALBUMIN HUMAN 5 % IV SOLN
INTRAVENOUS | Status: AC
  Filled 2019-09-15: qty 250

## 2019-09-15 MED ORDER — ROCURONIUM BROMIDE 50 MG/5ML IV SOSY
PREFILLED_SYRINGE | INTRAVENOUS | Status: DC | PRN
  Administered 2019-09-15: 10 mg via INTRAVENOUS
  Administered 2019-09-15: 50 mg via INTRAVENOUS

## 2019-09-15 MED ORDER — TRAMADOL HCL 50 MG PO TABS: 50.0000 mg | ORAL_TABLET | Freq: Four times a day (QID) | ORAL | Status: DC | PRN

## 2019-09-15 MED ORDER — LACTATED RINGERS IV SOLN: INTRAVENOUS | Status: DC

## 2019-09-15 MED ORDER — ONDANSETRON HCL 4 MG/2ML IJ SOLN
INTRAMUSCULAR | Status: AC
  Filled 2019-09-15: qty 2

## 2019-09-15 MED ORDER — KETOROLAC TROMETHAMINE 30 MG/ML IJ SOLN
INTRAMUSCULAR | Status: AC
  Filled 2019-09-15: qty 1

## 2019-09-15 MED ORDER — DOCUSATE SODIUM 100 MG PO CAPS
100.0000 mg | ORAL_CAPSULE | Freq: Two times a day (BID) | ORAL | Status: DC
  Administered 2019-09-15 – 2019-09-17 (×4): 100 mg via ORAL
  Filled 2019-09-15 (×4): qty 1

## 2019-09-15 MED ORDER — DEXTROSE 5 % IV SOLN
INTRAVENOUS | Status: DC | PRN
  Administered 2019-09-15: 2 g via INTRAVENOUS

## 2019-09-15 MED ORDER — ALBUTEROL SULFATE (2.5 MG/3ML) 0.083% IN NEBU
2.5000 mg | INHALATION_SOLUTION | RESPIRATORY_TRACT | Status: DC | PRN
  Administered 2019-09-15: 2.5 mg via RESPIRATORY_TRACT
  Filled 2019-09-15: qty 3

## 2019-09-15 MED ORDER — ACETAMINOPHEN 500 MG PO TABS
1000.0000 mg | ORAL_TABLET | Freq: Three times a day (TID) | ORAL | Status: DC
  Administered 2019-09-15 – 2019-09-17 (×5): 1000 mg via ORAL
  Filled 2019-09-15 (×6): qty 2

## 2019-09-15 MED ORDER — ONDANSETRON HCL 4 MG/2ML IJ SOLN
INTRAMUSCULAR | Status: DC | PRN
  Administered 2019-09-15: 4 mg via INTRAVENOUS

## 2019-09-15 MED ORDER — OXYCODONE HCL 5 MG PO TABS: 5.0000 mg | ORAL_TABLET | Freq: Once | ORAL | Status: DC | PRN

## 2019-09-15 MED ORDER — OXYCODONE HCL 5 MG/5ML PO SOLN: 5.0000 mg | Freq: Once | ORAL | Status: DC | PRN

## 2019-09-15 MED ORDER — LACTATED RINGERS IR SOLN
Status: DC | PRN
  Administered 2019-09-15: 2000 mL

## 2019-09-15 SURGICAL SUPPLY — 39 items
ADH SKN CLS APL DERMABOND .7 (GAUZE/BANDAGES/DRESSINGS) ×1
APPLIER CLIP 5 13 M/L LIGAMAX5 (MISCELLANEOUS)
APR CLP MED LRG 5 ANG JAW (MISCELLANEOUS)
BAG SPEC RTRVL 10 TROC 200 (ENDOMECHANICALS) ×1
CABLE HIGH FREQUENCY MONO STRZ (ELECTRODE) IMPLANT
CLIP APPLIE 5 13 M/L LIGAMAX5 (MISCELLANEOUS) IMPLANT
CLOSURE WOUND 1/2 X4 (GAUZE/BANDAGES/DRESSINGS)
COVER SURGICAL LIGHT HANDLE (MISCELLANEOUS) ×3 IMPLANT
COVER WAND RF STERILE (DRAPES) IMPLANT
CUTTER FLEX LINEAR 45M (STAPLE) ×2 IMPLANT
DECANTER SPIKE VIAL GLASS SM (MISCELLANEOUS) ×3 IMPLANT
DERMABOND ADVANCED (GAUZE/BANDAGES/DRESSINGS) ×2
DERMABOND ADVANCED .7 DNX12 (GAUZE/BANDAGES/DRESSINGS) IMPLANT
GLOVE BIO SURGEON STRL SZ7.5 (GLOVE) ×3 IMPLANT
GLOVE INDICATOR 8.0 STRL GRN (GLOVE) ×3 IMPLANT
GOWN STRL REUS W/TWL XL LVL3 (GOWN DISPOSABLE) ×6 IMPLANT
GRASPER SUT TROCAR 14GX15 (MISCELLANEOUS) IMPLANT
IV LACTATED RINGERS 1000ML (IV SOLUTION) ×3 IMPLANT
KIT BASIN OR (CUSTOM PROCEDURE TRAY) ×3 IMPLANT
KIT TURNOVER KIT A (KITS) IMPLANT
PENCIL SMOKE EVACUATOR (MISCELLANEOUS) IMPLANT
POUCH RETRIEVAL ECOSAC 10 (ENDOMECHANICALS) ×1 IMPLANT
POUCH RETRIEVAL ECOSAC 10MM (ENDOMECHANICALS) ×3
RELOAD 45 VASCULAR/THIN (ENDOMECHANICALS) ×3 IMPLANT
RELOAD STAPLE 45 2.5 WHT GRN (ENDOMECHANICALS) IMPLANT
RELOAD STAPLE 45 3.5 BLU ETS (ENDOMECHANICALS) IMPLANT
RELOAD STAPLE TA45 3.5 REG BLU (ENDOMECHANICALS) IMPLANT
SET IRRIG TUBING LAPAROSCOPIC (IRRIGATION / IRRIGATOR) ×3 IMPLANT
SET TUBE SMOKE EVAC HIGH FLOW (TUBING) ×3 IMPLANT
SHEARS HARMONIC ACE PLUS 36CM (ENDOMECHANICALS) ×3 IMPLANT
SLEEVE XCEL OPT CAN 5 100 (ENDOMECHANICALS) ×3 IMPLANT
STRIP CLOSURE SKIN 1/2X4 (GAUZE/BANDAGES/DRESSINGS) IMPLANT
SUT MNCRL AB 4-0 PS2 18 (SUTURE) ×3 IMPLANT
SUT VICRYL 0 TIES 12 18 (SUTURE) IMPLANT
TOWEL OR 17X26 10 PK STRL BLUE (TOWEL DISPOSABLE) ×3 IMPLANT
TRAY FOLEY MTR SLVR 16FR STAT (SET/KITS/TRAYS/PACK) ×3 IMPLANT
TRAY LAPAROSCOPIC (CUSTOM PROCEDURE TRAY) ×3 IMPLANT
TROCAR BLADELESS OPT 5 100 (ENDOMECHANICALS) ×3 IMPLANT
TROCAR XCEL BLUNT TIP 100MML (ENDOMECHANICALS) ×3 IMPLANT

## 2019-09-15 NOTE — Discharge Instructions (Signed)
[facility] ° °Please arrive at least 30 min before your appointment to complete your check in paperwork.  If you are unable to arrive 30 min prior to your appointment time we may have to cancel or reschedule you. °LAPAROSCOPIC SURGERY: POST OP INSTRUCTIONS °Always review your discharge instruction sheet given to you by the facility where your surgery was performed. °IF YOU HAVE DISABILITY OR FAMILY LEAVE FORMS, YOU MUST BRING THEM TO THE OFFICE FOR PROCESSING.   °DO NOT GIVE THEM TO YOUR DOCTOR. ° °PAIN CONTROL ° °1. First take acetaminophen (Tylenol) AND/or ibuprofen (Advil) to control your pain after surgery.  Follow directions on package.  Taking acetaminophen (Tylenol) and/or ibuprofen (Advil) regularly after surgery will help to control your pain and lower the amount of prescription pain medication you may need.  You should not take more than 4,000 mg (4 grams) of acetaminophen (Tylenol) in 24 hours.  You should not take ibuprofen (Advil), aleve, motrin, naprosyn or other NSAIDS if you have a history of stomach ulcers or chronic kidney disease.  °2. A prescription for pain medication may be given to you upon discharge.  Take your pain medication as prescribed, if you still have uncontrolled pain after taking acetaminophen (Tylenol) or ibuprofen (Advil). °3. Use ice packs to help control pain. °4. If you need a refill on your pain medication, please contact your pharmacy.  They will contact our office to request authorization. Prescriptions will not be filled after 5pm or on week-ends. ° °HOME MEDICATIONS °5. Take your usually prescribed medications unless otherwise directed. ° °DIET °6. You should follow a light diet the first few days after arrival home.  Be sure to include lots of fluids daily. Avoid fatty, fried foods.  ° °CONSTIPATION °7. It is common to experience some constipation after surgery and if you are taking pain medication.  Increasing fluid intake and taking a stool  softener (such as Colace) will usually help or prevent this problem from occurring.  A mild laxative (Milk of Magnesia or Miralax) should be taken according to package instructions if there are no bowel movements after 48 hours. ° °WOUND/INCISION CARE °8. Most patients will experience some swelling and bruising in the area of the incisions.  Ice packs will help.  Swelling and bruising can take several days to resolve.  °9. Unless discharge instructions indicate otherwise, follow guidelines below  °a. STERI-STRIPS - you may remove your outer bandages 48 hours after surgery, and you may shower at that time.  You have steri-strips (small skin tapes) in place directly over the incision.  These strips should be left on the skin for 7-10 days.   °b. DERMABOND/SKIN GLUE - you may shower in 24 hours.  The glue will flake off over the next 2-3 weeks. °10. Any sutures or staples will be removed at the office during your follow-up visit. ° °ACTIVITIES °11. You may resume regular (light) daily activities beginning the next day--such as daily self-care, walking, climbing stairs--gradually increasing activities as tolerated.  You may have sexual intercourse when it is comfortable.  Refrain from any heavy lifting or straining until approved by your doctor. °a. You may drive when you are no longer taking prescription pain medication, you can comfortably wear a seatbelt, and you can safely maneuver your car and apply brakes. ° °FOLLOW-UP °12. You should see your doctor in the office for a follow-up appointment approximately 2-3 weeks after your surgery.  You should have been given your post-op/follow-up appointment when   your surgery was scheduled.  If you did not receive a post-op/follow-up appointment, make sure that you call for this appointment within a day or two after you arrive home to insure a convenient appointment time. ° °OTHER INSTRUCTIONS ° °WHEN TO CALL YOUR DOCTOR: °1. Fever over 101.0 °2. Inability to  urinate °3. Continued bleeding from incision. °4. Increased pain, redness, or drainage from the incision. °5. Increasing abdominal pain ° °The clinic staff is available to answer your questions during regular business hours.  Please don’t hesitate to call and ask to speak to one of the nurses for clinical concerns.  If you have a medical emergency, go to the nearest emergency room or call 911.  A surgeon from [facility] is always on call at the hospital. °[address], [address], [place], [place]  [address] ? [address] [address], [place], [place]   [address] °[phone] ? [phone] ? FAX [phone] ° ° ° °

## 2019-09-15 NOTE — Anesthesia Procedure Notes (Addendum)
Procedure Name: Intubation Date/Time: 09/15/2019 11:15 AM Performed by: Maxwell Caul, CRNA Pre-anesthesia Checklist: Patient identified, Emergency Drugs available, Suction available and Patient being monitored Patient Re-evaluated:Patient Re-evaluated prior to induction Oxygen Delivery Method: Circle system utilized Preoxygenation: Pre-oxygenation with 100% oxygen Induction Type: IV induction Ventilation: Mask ventilation without difficulty Laryngoscope Size: Mac and 4 Grade View: Grade I Tube type: Oral Tube size: 7.5 mm Number of attempts: 1 Airway Equipment and Method: Stylet and Oral airway Placement Confirmation: ETT inserted through vocal cords under direct vision,  positive ETCO2 and breath sounds checked- equal and bilateral Secured at: 22 cm Tube secured with: Tape Dental Injury: Teeth and Oropharynx as per pre-operative assessment

## 2019-09-15 NOTE — Anesthesia Postprocedure Evaluation (Signed)
Anesthesia Post Note  Patient: Nicholas Owen  Procedure(s) Performed: APPENDECTOMY LAPAROSCOPIC (N/A Abdomen)     Patient location during evaluation: PACU Anesthesia Type: General Level of consciousness: sedated and patient cooperative Pain management: pain level controlled Vital Signs Assessment: post-procedure vital signs reviewed and stable Respiratory status: spontaneous breathing (Placed on BiPap post extubation) Cardiovascular status: stable Anesthetic complications: no Comments: Respiratory status continues to improve on BiPap. Dr. Redmond Pulling plans on sending to stepdown. Will plan to transition to stepdown and allow him to continue to wean from bipap.     Last Vitals:  Vitals:   09/15/19 1515 09/15/19 1530  BP: 94/75 102/75  Pulse: (!) 57 60  Resp: 10 14  Temp:    SpO2: 97% 98%    Last Pain:  Vitals:   09/15/19 1515  TempSrc:   PainSc: [name]

## 2019-09-15 NOTE — Transfer of Care (Addendum)
Immediate Anesthesia Transfer of Care Note  Patient: Nicholas Owen  Procedure(s) Performed: APPENDECTOMY LAPAROSCOPIC (N/A Abdomen)  Patient Location: PACU  Anesthesia Type:General  Level of Consciousness: awake, alert  and oriented  Airway & Oxygen Therapy: Bipap,   Post-op Assessment: Report given to RN and Post -op Vital signs reviewed and stable  Post vital signs: Reviewed and stable  Last Vitals:  Vitals Value Taken Time  BP 127/78 09/15/19 1302  Temp 36.3 C 09/15/19 1302  Pulse 77 09/15/19 1310  Resp 12 09/15/19 1310  SpO2 94 % 09/15/19 1310  Vitals shown include unvalidated device data.  Last Pain:  Vitals:   09/15/19 0938  TempSrc:   PainSc: 4       Patients Stated Pain Goal: 2 ([id number])  Complications: No apparent anesthesia complications

## 2019-09-15 NOTE — Op Note (Signed)
MCLAREN LOUCH [id number] [birth year] 09/15/2019  Appendectomy, Lap, Procedure Note  Indications: The patient presented with a history of right-sided abdominal pain. A CT revealed findings consistent with acute appendicitis.  Pre-operative Diagnosis: acute appendicitis  Post-operative Diagnosis: perforated appendicitis; purulent & feculent peritonitis  Surgeon: Greer Pickerel MD FACS  Assistants: Margie Billet PA-C (an assistant was requested due to the patient's central truncal obesity and retracting tissue in order to facilitate exposure of the appendix)  Anesthesia: General endotracheal anesthesia  Procedure Details  The patient was seen again in the Holding Room. The risks, benefits, complications, treatment options, and expected outcomes were discussed with the patient and/or family. The possibilities of perforation of viscus, bleeding, recurrent infection, the need for additional procedures, failure to diagnose a condition, and creating a complication requiring transfusion or operation were discussed. There was concurrence with the proposed plan and informed consent was obtained. The site of surgery was properly noted. The patient was taken to Operating Room, identified as TRAYSHAUN CLERE and the procedure verified as Appendectomy. A Time Out was held and the above information confirmed.  The patient was placed in the supine position and general anesthesia was induced, along with placement of orogastric tube, SCDs.  His preoperative CT showed that his appendix was actually along the right paracolic gutter extending up toward the hepatic edge therefore I decided to gain access to the abdomen using the Optiview technique in the left upper quadrant.  I also decided to Optiview the patient because of his central truncal obesity.  A small incision was made just below the left midclavicular line in the subcostal area.  Then using a 0 degree 5 mm laparoscope with a 5 mm trocar advanced through all  layers of the down wall and carefully entered the abdomen.  Pneumoperitoneum was smoothly established up to a patient pressure of 15 mmHg.  The laparoscope was advanced and abdominal cavity was surveilled.  There is no evidence of injury to surrounding structures.  There was purulent fluid in the right upper quadrant.  No signs of adhesions to his anterior abdominal wall from his prior surgeries.  I placed a 5 mm trocar in the supraumbilical position under direct visualization after local been infiltrated.  I was able to identify the cecum and ascending colon which was full of stool.  The patient had a fair amount of central truncal obesity.  Was able to identify the base of the cecum and the terminal ileum.  This was in the right midabdomen.  I therefore went ahead and placed an additional 5 mm trocar in the right lower quadrant also under direct visualization after local been infiltrated.  I was able to readily identify the base of the appendix where it joined the cecum.  The body and tip of the appendix appeared densely adhered and inflamed and tethered to the posterior lateral aspect of the ascending colon heading up toward the right edge of the liver.  I decided to go ahead and take and mobilized the base of the appendix.  A [place] retractor was used to create a window around the base of the appendix.  I then took down some of the mesoappendix with harmonic scalpel.  I ended up changing the supraumbilical trocar to an 12 mm trocar to accommodate stapler. The appendix was divided at its base using an endo-GIA stapler with a white load. No appendiceal stump was left in place.  I then started to take down some of the mesoappendix sequentially with the harmonic  scalpel.  The mesoappendix was densely inflamed and indurated.  The appendix started to become separated in half.  There was a large fat pad along the lateral aspect of the ascending colon obstructing my view of the remaining portion of the appendix  therefore I placed an additional 5 mm trocar in the left mid abdomen.  My assistant was then able to retract that fat pad medially so I could get a better visualization of the remaining portion of the appendix.  As we lifted off some of the inflammatory omentum in that area there is an obvious perforation of the tip of the appendix.  There was some evidence of feculent material that had escaped from the appendix.  We were able to finish mobilizing and take down the remaining part of the mesoappendix with harmonic scalpel and ultimately freed the appendix in its entirety.  We then irrigated the right lateral abdomen and upper quadrant with 2 L of saline.  There is no evidence of bleeding from the mesoappendix or the staple line.  The appendix in its entirety was placed in Ecco sac and brought out through the supraumbilical trocar site.  Pneumoperitoneum was reestablished.  I reinspected the staple line.  No evidence of bleeding.  Good hemostasis.  The supraumbilical 50 m trocar was removed and the fascial defect was closed with 2 interrupted 0 Vicryl sutures using a PMI suture passer with laparoscopic guidance.  Local was infiltrated around the umbilical fascial closure.   The closure was viewed laparoscopically. There was no residual palpable fascial defect.  The trocar site skin wounds were closed with 4-0 Monocryl. Dermabond was applied to the skin incisions.  Instrument, sponge, and needle counts were correct at the conclusion of the case.   Findings: The appendix was found to be inflamed. There were not signs of necrosis.  There was perforation. There was not abscess formation.  Patient had both purulent as well as feculent fluid in the right upper quadrant.  Estimated Blood Loss:  Minimal         Drains: none         Specimens: appendix         Complications:  None; patient tolerated the procedure well.         Disposition: PACU - hemodynamically stable.         Condition: stable  Nicholas Owen.  Redmond Pulling, MD, FACS General, Bariatric, & Minimally Invasive Surgery [facility], [place]

## 2019-09-15 NOTE — ED Notes (Signed)
Pt. Documented in error see above note in chart. 

## 2019-09-15 NOTE — Anesthesia Preprocedure Evaluation (Signed)
Anesthesia Evaluation  Patient identified by MRN, date of birth, ID band Patient awake    Reviewed: Allergy & Precautions, NPO status , Patient's Chart, lab work & pertinent test results  Airway Mallampati: II  TM Distance: >3 FB Neck ROM: Full    Dental no notable dental hx.    Pulmonary neg pulmonary ROS,    Pulmonary exam normal breath sounds clear to auscultation       Cardiovascular negative cardio ROS Normal cardiovascular exam Rhythm:Regular Rate:Normal     Neuro/Psych negative neurological ROS  negative psych ROS   GI/Hepatic Neg liver ROS, GERD  ,  Endo/Other  negative endocrine ROS  Renal/GU negative Renal ROS  negative genitourinary   Musculoskeletal negative musculoskeletal ROS (+)   Abdominal   Peds negative pediatric ROS (+)  Hematology negative hematology ROS (+)   Anesthesia Other Findings   Reproductive/Obstetrics negative OB ROS                            Anesthesia Physical Anesthesia Plan  ASA: II  Anesthesia Plan: General   Post-op Pain Management:    Induction: Intravenous  PONV Risk Score and Plan: 2 and Ondansetron, Dexamethasone and Treatment may vary due to age or medical condition  Airway Management Planned: Oral ETT  Additional Equipment:   Intra-op Plan:   Post-operative Plan: Extubation in OR  Informed Consent: I have reviewed the patients History and Physical, chart, labs and discussed the procedure including the risks, benefits and alternatives for the proposed anesthesia with the patient or authorized representative who has indicated his/her understanding and acceptance.     Dental advisory given  Plan Discussed with: CRNA and Surgeon  Anesthesia Plan Comments:         Anesthesia Quick Evaluation  

## 2019-09-15 NOTE — Progress Notes (Signed)
Patient SOB and wheezing. RN requesting PRN albuterol. MD gave verbal okay. MD also gave verbal to give OR abx on inpatient unit.

## 2019-09-15 NOTE — H&P (Signed)
CC: Right lower quadrant abdominal pain    HPI: Nicholas Owen is an 74 y.o. male who is here for evaluation right lower quadrant pain.  The patient was transferred from [facility].  He presented there with a 2-day history of right lower quadrant abdominal pain.  The pain is moderate and sharp in intensity.  He has had decreased appetite.  He had nausea with one episode of emesis.  Bowel movements are normal.  He has not had similar abdominal pain.  He does have a history of factor V Leiden disorder and has had a DVT and pulmonary embolus in the past but is now only on an aspirin.  He is otherwise been doing well.  His pain does not refer anywhere else.  He has no cardiopulmonary complaints  Past Medical History:  Diagnosis Date  . Allergy   . Asbestos exposure   . Basal cell carcinoma    skin of left ear  . BPH (benign prostatic hyperplasia)   . Cancer ([place])    basal cell CA (skin)  . Colitis 01/2011  . Colon polyp   . Deviated nasal septum    hx. of  . Diverticulosis   . DVT (deep venous thrombosis) ([place])   . GERD (gastroesophageal reflux disease)   . Hyperlipidemia   . Kidney stones   . Lipoma 12-24-11   multiple(present- lipoma 2-lt./4- rt arms/ back/ chest  . Liver cyst 10/11   4 simple cysts in liver  . 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 Prostate cancer ([place]) 12-24-11   dx. Prostate cancer after bx.  . Ulcerative colitis [facility])     Past Surgical History:  Procedure Laterality Date  . COLONOSCOPY    . CYSTOSCOPY  02/16/2012   Procedure: CYSTOSCOPY FLEXIBLE;  Surgeon: Bernestine Amass, MD;  Location: [facility];  Service: Urology;  Laterality: N/A;  FLEX CYSTO WITH REMOVAL OF STAPLES   . INGUINAL HERNIA REPAIR  1990's   double  . KNEE SURGERY  2007   right knee scope  . lipoma removals  1990's   multiple and multiple remains now  . LYMPH NODE DISSECTION  12/31/2011   Procedure: LYMPH NODE DISSECTION;   Surgeon: Bernestine Amass, MD;  Location: [facility];  Service: Urology;  Laterality: Bilateral;  Bilateral  . POLYPECTOMY    . ROBOT ASSISTED LAPAROSCOPIC RADICAL PROSTATECTOMY  12/31/2011   Procedure: ROBOTIC ASSISTED LAPAROSCOPIC RADICAL PROSTATECTOMY;  Surgeon: Bernestine Amass, MD;  Location: [facility];  Service: Urology;  Laterality: N/A;      . TONSILLECTOMY      Family History  Problem Relation Age of Onset  . Colon cancer Mother 27  . Colon cancer Father 47  . Prostate cancer Paternal Grandfather   . Esophageal cancer Paternal Grandfather   . Stomach cancer Neg Hx   . Rectal cancer Neg Hx     Social:  reports that he has never smoked. He has never used smokeless tobacco. He reports current alcohol use. He reports that he does not use drugs.  Allergies:  Allergies  Allergen Reactions  . Nsaids     With high doses and extended period of time can cause colitis    Medications: I have reviewed the patient's current medications.  Results for orders placed or performed during the hospital encounter of 09/14/19 (from the past 48 hour(s))  Lipase, blood     Status: None  Collection Time: 09/14/19  8:16 PM  Result Value Ref Range   Lipase 24 11 - 51 U/L    Comment: Performed at [facility], [address] [place], [place] [address]  Comprehensive metabolic panel     Status: Abnormal   Collection Time: 09/14/19  8:16 PM  Result Value Ref Range   Sodium 134 (L) 135 - 145 mmol/L   Potassium 3.9 3.5 - 5.1 mmol/L   Chloride 99 98 - 111 mmol/L   CO2 23 22 - 32 mmol/L   Glucose, Bld 117 (H) 70 - 99 mg/dL    Comment: Glucose reference range applies only to samples taken after fasting for at least 8 hours.   BUN 12 8 - 23 mg/dL   Creatinine, Ser 0.93 0.61 - 1.24 mg/dL   Calcium 8.8 (L) 8.9 - 10.3 mg/dL   Total Protein 7.2 6.5 - 8.1 g/dL   Albumin 4.0 3.5 - 5.0 g/dL   AST 18 15 - 41 U/L   ALT 23 0 - 44 U/L   Alkaline Phosphatase 71 38 - 126 U/L   Total Bilirubin 2.4 (H)  0.3 - 1.2 mg/dL   GFR calc non Af Amer >60 >60 mL/min   GFR calc Af Amer >60 >60 mL/min   Anion gap 12 5 - 15    Comment: Performed at [facility], [address] [place], [place] [address]  CBC     Status: Abnormal   Collection Time: 09/14/19  8:16 PM  Result Value Ref Range   WBC 14.9 (H) 4.0 - 10.5 K/uL   RBC 5.37 4.22 - 5.81 MIL/uL   Hemoglobin 15.6 13.0 - 17.0 g/dL   HCT 45.9 39.0 - 52.0 %   MCV 85.5 80.0 - 100.0 fL   MCH 29.1 26.0 - 34.0 pg   MCHC 34.0 30.0 - 36.0 g/dL   RDW 13.2 11.5 - 15.5 %   Platelets 305 150 - 400 K/uL   nRBC 0.0 0.0 - 0.2 %    Comment: Performed at [facility], [address] [place], [place] [address]  Respiratory Panel by RT PCR (Flu A&B, Covid) - Nasopharyngeal Swab     Status: None   Collection Time: 09/14/19  9:42 PM   Specimen: Nasopharyngeal Swab  Result Value Ref Range   SARS Coronavirus 2 by RT PCR NEGATIVE NEGATIVE    Comment: (NOTE) SARS-CoV-2 target nucleic acids are NOT DETECTED. The SARS-CoV-2 RNA is generally detectable in upper respiratoy specimens during the acute phase of infection. The lowest concentration of SARS-CoV-2 viral copies this assay can detect is 131 copies/mL. A negative result does not preclude SARS-Cov-2 infection and should not be used as the sole basis for treatment or other patient management decisions. A negative result may occur with  improper specimen collection/handling, submission of specimen other than nasopharyngeal swab, presence of viral mutation(s) within the areas targeted by this assay, and inadequate number of viral copies (<131 copies/mL). A negative result must be combined with clinical observations, patient history, and epidemiological information. The expected result is Negative. Fact Sheet for Patients:  PinkCheek.be Fact Sheet for Healthcare Providers:  GravelBags.it This test is not yet ap proved or cleared by  the [place] FDA and  has been authorized for detection and/or diagnosis of SARS-CoV-2 by FDA under an Emergency Use Authorization (EUA). This EUA will remain  in effect (meaning this test can be used) for the duration of the COVID-19 declaration  under Section 564(b)(1) of the Act, 21 U.S.C. section 360bbb-3(b)(1), unless the authorization is terminated or revoked sooner.    Influenza A by PCR NEGATIVE NEGATIVE   Influenza B by PCR NEGATIVE NEGATIVE    Comment: (NOTE) The Xpert Xpress SARS-CoV-2/FLU/RSV assay is intended as an aid in  the diagnosis of influenza from Nasopharyngeal swab specimens and  should not be used as a sole basis for treatment. Nasal washings and  aspirates are unacceptable for Xpert Xpress SARS-CoV-2/FLU/RSV  testing. Fact Sheet for Patients: PinkCheek.be Fact Sheet for Healthcare Providers: GravelBags.it This test is not yet approved or cleared by the [place] FDA and  has been authorized for detection and/or diagnosis of SARS-CoV-2 by  FDA under an Emergency Use Authorization (EUA). This EUA will remain  in effect (meaning this test can be used) for the duration of the  Covid-19 declaration under Section 564(b)(1) of the Act, 21  U.S.C. section 360bbb-3(b)(1), unless the authorization is  terminated or revoked. Performed at [facility], [address] [place], [place] [address]   Urinalysis, Routine w reflex microscopic     Status: Abnormal   Collection Time: 09/14/19  9:43 PM  Result Value Ref Range   Color, Urine YELLOW YELLOW   APPearance CLEAR CLEAR   Specific Gravity, Urine <1.005 (L) 1.005 - 1.030   pH 6.0 5.0 - 8.0   Glucose, UA NEGATIVE NEGATIVE mg/dL   Hgb urine dipstick NEGATIVE NEGATIVE   Bilirubin Urine NEGATIVE NEGATIVE   Ketones, ur NEGATIVE NEGATIVE mg/dL   Protein, ur NEGATIVE NEGATIVE mg/dL   Nitrite NEGATIVE NEGATIVE   Leukocytes,Ua NEGATIVE NEGATIVE     Comment: Microscopic not done on urines with negative protein, blood, leukocytes, nitrite, or glucose < 500 mg/dL. Performed at [facility], [address] [place], [place] [address]     CT ABDOMEN PELVIS W CONTRAST  Result Date: 09/14/2019 CLINICAL DATA:  Right lower quadrant pain EXAM: CT ABDOMEN AND PELVIS WITH CONTRAST TECHNIQUE: Multidetector CT imaging of the abdomen and pelvis was performed using the standard protocol following bolus administration of intravenous contrast. CONTRAST:  151mL OMNIPAQUE IOHEXOL 300 MG/ML  SOLN COMPARISON:  August 2018 FINDINGS: Lower chest: Bibasilar atelectasis. Partially imaged focally prominent pleural fat along the lateral chest wall. Moderate hiatal hernia. Hepatobiliary: Too small to characterize hypoattenuating lesion of the right and left lobes of the liver. Small cyst of the caudate. Single subcentimeter gallbladder calculus is again noted. No biliary dilatation. Pancreas: Unremarkable. Spleen: Numerous calcified granulomas are again noted. Otherwise unremarkable. Adrenals/Urinary Tract: Adrenals are unremarkable. Bilateral renal cysts and too small to characterize hypoattenuating lesions. Bladder is poorly distended but otherwise unremarkable. Stomach/Bowel: Stomach is unremarkable apart from hiatal hernia. Bowel is normal in caliber. There is distal colonic diverticulosis. The appendix is distended particularly more distally beyond a subcentimeter calculus. There is surrounding fat infiltration. Vascular/Lymphatic: Aortic atherosclerosis. No enlarged abdominal or pelvic lymph nodes. Reproductive: Unremarkable. Other: No abscess. Musculoskeletal: Degenerative changes of the spine. No acute osseous abnormality. IMPRESSION: Acute appendicitis.  No abscess. Chronic findings detailed above. These results were called by telephone at the time of interpretation on 09/14/2019 at 9:15 pm to provider DAN FLOYD , who verbally acknowledged these results.  Electronically Signed   By: Macy Mis M.D.   On: 09/14/2019 21:15    ROS - all of the below systems have been reviewed with the patient and positives are indicated with bold text General: chills, fever or night sweats Eyes:  blurry vision or double vision ENT: epistaxis or sore throat Allergy/Immunology: itchy/watery eyes or nasal congestion Hematologic/Lymphatic: bleeding problems, blood clots or swollen lymph nodes Endocrine: temperature intolerance or unexpected weight changes Breast: new or changing breast lumps or nipple discharge Resp: cough, shortness of breath, or wheezing CV: chest pain or dyspnea on exertion GI: as per HPI GU: dysuria, trouble voiding, or hematuria MSK: joint pain or joint stiffness Neuro: TIA or stroke symptoms Derm: pruritus and skin lesion changes Psych: anxiety and depression  PE Blood pressure 135/81, pulse 72, temperature 100.1 F (37.8 C), temperature source Oral, resp. rate 17, weight 117.6 kg, SpO2 95 %. Constitutional: NAD; conversant; no deformities Eyes: Moist conjunctiva; no lid lag; anicteric; PERRL Neck: Trachea midline; no thyromegaly Lungs: Normal respiratory effort; no tactile fremitus CV: RRR; no palpable thrills; no pitting edema GI: Abd soft with some right lower quadrant tenderness and guarding; no palpable hepatosplenomegaly MSK: No long bone abnormalities normal range of motion of extremities; no clubbing/cyanosis Psychiatric: Appropriate affect; alert and oriented x3 Lymphatic: No palpable cervical or axillary lymphadenopathy  Results for orders placed or performed during the hospital encounter of 09/14/19 (from the past 48 hour(s))  Lipase, blood     Status: None   Collection Time: 09/14/19  8:16 PM  Result Value Ref Range   Lipase 24 11 - 51 U/L    Comment: Performed at [facility], [address] [place], [place] [address]  Comprehensive metabolic panel     Status: Abnormal   Collection Time: 09/14/19   8:16 PM  Result Value Ref Range   Sodium 134 (L) 135 - 145 mmol/L   Potassium 3.9 3.5 - 5.1 mmol/L   Chloride 99 98 - 111 mmol/L   CO2 23 22 - 32 mmol/L   Glucose, Bld 117 (H) 70 - 99 mg/dL    Comment: Glucose reference range applies only to samples taken after fasting for at least 8 hours.   BUN 12 8 - 23 mg/dL   Creatinine, Ser 0.93 0.61 - 1.24 mg/dL   Calcium 8.8 (L) 8.9 - 10.3 mg/dL   Total Protein 7.2 6.5 - 8.1 g/dL   Albumin 4.0 3.5 - 5.0 g/dL   AST 18 15 - 41 U/L   ALT 23 0 - 44 U/L   Alkaline Phosphatase 71 38 - 126 U/L   Total Bilirubin 2.4 (H) 0.3 - 1.2 mg/dL   GFR calc non Af Amer >60 >60 mL/min   GFR calc Af Amer >60 >60 mL/min   Anion gap 12 5 - 15    Comment: Performed at [facility], [address] [place], [place] [address]  CBC     Status: Abnormal   Collection Time: 09/14/19  8:16 PM  Result Value Ref Range   WBC 14.9 (H) 4.0 - 10.5 K/uL   RBC 5.37 4.22 - 5.81 MIL/uL   Hemoglobin 15.6 13.0 - 17.0 g/dL   HCT 45.9 39.0 - 52.0 %   MCV 85.5 80.0 - 100.0 fL   MCH 29.1 26.0 - 34.0 pg   MCHC 34.0 30.0 - 36.0 g/dL   RDW 13.2 11.5 - 15.5 %   Platelets 305 150 - 400 K/uL   nRBC 0.0 0.0 - 0.2 %    Comment: Performed at [facility], [address] [place], [place] [address]  Respiratory Panel by RT PCR (Flu A&B, Covid) - Nasopharyngeal Swab     Status: None   Collection  Time: 09/14/19  9:42 PM   Specimen: Nasopharyngeal Swab  Result Value Ref Range   SARS Coronavirus 2 by RT PCR NEGATIVE NEGATIVE    Comment: (NOTE) SARS-CoV-2 target nucleic acids are NOT DETECTED. The SARS-CoV-2 RNA is generally detectable in upper respiratoy specimens during the acute phase of infection. The lowest concentration of SARS-CoV-2 viral copies this assay can detect is 131 copies/mL. A negative result does not preclude SARS-Cov-2 infection and should not be used as the sole basis for treatment or other patient management decisions. A negative result may occur  with  improper specimen collection/handling, submission of specimen other than nasopharyngeal swab, presence of viral mutation(s) within the areas targeted by this assay, and inadequate number of viral copies (<131 copies/mL). A negative result must be combined with clinical observations, patient history, and epidemiological information. The expected result is Negative. Fact Sheet for Patients:  PinkCheek.be Fact Sheet for Healthcare Providers:  GravelBags.it This test is not yet ap proved or cleared by the [place] FDA and  has been authorized for detection and/or diagnosis of SARS-CoV-2 by FDA under an Emergency Use Authorization (EUA). This EUA will remain  in effect (meaning this test can be used) for the duration of the COVID-19 declaration under Section 564(b)(1) of the Act, 21 U.S.C. section 360bbb-3(b)(1), unless the authorization is terminated or revoked sooner.    Influenza A by PCR NEGATIVE NEGATIVE   Influenza B by PCR NEGATIVE NEGATIVE    Comment: (NOTE) The Xpert Xpress SARS-CoV-2/FLU/RSV assay is intended as an aid in  the diagnosis of influenza from Nasopharyngeal swab specimens and  should not be used as a sole basis for treatment. Nasal washings and  aspirates are unacceptable for Xpert Xpress SARS-CoV-2/FLU/RSV  testing. Fact Sheet for Patients: PinkCheek.be Fact Sheet for Healthcare Providers: GravelBags.it This test is not yet approved or cleared by the [place] FDA and  has been authorized for detection and/or diagnosis of SARS-CoV-2 by  FDA under an Emergency Use Authorization (EUA). This EUA will remain  in effect (meaning this test can be used) for the duration of the  Covid-19 declaration under Section 564(b)(1) of the Act, 21  U.S.C. section 360bbb-3(b)(1), unless the authorization is  terminated or revoked. Performed at [facility], [address] [place], [place] [address]   Urinalysis, Routine w reflex microscopic     Status: Abnormal   Collection Time: 09/14/19  9:43 PM  Result Value Ref Range   Color, Urine YELLOW YELLOW   APPearance CLEAR CLEAR   Specific Gravity, Urine <1.005 (L) 1.005 - 1.030   pH 6.0 5.0 - 8.0   Glucose, UA NEGATIVE NEGATIVE mg/dL   Hgb urine dipstick NEGATIVE NEGATIVE   Bilirubin Urine NEGATIVE NEGATIVE   Ketones, ur NEGATIVE NEGATIVE mg/dL   Protein, ur NEGATIVE NEGATIVE mg/dL   Nitrite NEGATIVE NEGATIVE   Leukocytes,Ua NEGATIVE NEGATIVE    Comment: Microscopic not done on urines with negative protein, blood, leukocytes, nitrite, or glucose < 500 mg/dL. Performed at [facility], [address] [place], [place] [address]     CT ABDOMEN PELVIS W CONTRAST  Result Date: 09/14/2019 CLINICAL DATA:  Right lower quadrant pain EXAM: CT ABDOMEN AND PELVIS WITH CONTRAST TECHNIQUE: Multidetector CT imaging of the abdomen and pelvis was performed using the standard protocol following bolus administration of intravenous contrast. CONTRAST:  188mL OMNIPAQUE IOHEXOL 300 MG/ML  SOLN COMPARISON:  August 2018 FINDINGS: Lower chest: Bibasilar atelectasis. Partially imaged focally prominent pleural  fat along the lateral chest wall. Moderate hiatal hernia. Hepatobiliary: Too small to characterize hypoattenuating lesion of the right and left lobes of the liver. Small cyst of the caudate. Single subcentimeter gallbladder calculus is again noted. No biliary dilatation. Pancreas: Unremarkable. Spleen: Numerous calcified granulomas are again noted. Otherwise unremarkable. Adrenals/Urinary Tract: Adrenals are unremarkable. Bilateral renal cysts and too small to characterize hypoattenuating lesions. Bladder is poorly distended but otherwise unremarkable. Stomach/Bowel: Stomach is unremarkable apart from hiatal hernia. Bowel is normal in caliber. There is distal colonic diverticulosis. The  appendix is distended particularly more distally beyond a subcentimeter calculus. There is surrounding fat infiltration. Vascular/Lymphatic: Aortic atherosclerosis. No enlarged abdominal or pelvic lymph nodes. Reproductive: Unremarkable. Other: No abscess. Musculoskeletal: Degenerative changes of the spine. No acute osseous abnormality. IMPRESSION: Acute appendicitis.  No abscess. Chronic findings detailed above. These results were called by telephone at the time of interpretation on 09/14/2019 at 9:15 pm to provider DAN FLOYD , who verbally acknowledged these results. Electronically Signed   By: Macy Mis M.D.   On: 09/14/2019 21:15     A/P:  Acute appendicitis  I have reviewed the patient's CAT scan of the abdomen and pelvis and discussed the findings with he and his wife.  He has acute appendicitis without evidence of perforation or abscess.  There is an appendicolith.  Given the findings, I recommend admission to the hospital and appendectomy later today.  I discussed the reasons for this with him in detail.  I discussed the surgical procedure as well.  This will be performed by my partner Dr. Greer Pickerel who is our acute care surgeon and he will decide the timing of the surgery this morning.  IV antibiotics have been given.  The patient and his wife agree with the plan.  He will receive Lovenox preoperatively as he is at an increased risk for a deep venous thrombosis given his history of factor V Leiden disorder.  Coralie Keens, MD [facility]

## 2019-09-15 NOTE — Interval H&P Note (Signed)
History and Physical Interval Note:  09/15/2019 8:18 AM  Nicholas Owen  has presented today for surgery, with the diagnosis of appendicitis.  The various methods of treatment have been discussed with the patient and family. After consideration of risks, benefits and other options for treatment, the patient has consented to  Procedure(s): APPENDECTOMY LAPAROSCOPIC (N/A) as a surgical intervention.  [name] history has been reviewed, patient examined, no change in status, stable for surgery.  I have reviewed the patient's chart and labs.  Questions were answered to the patient's satisfaction.    Pt seen & examined Right sided TTP; not rigid; no guarding Nontoxic;   We discussed the etiology and management of acute appendicitis. We discussed operative and nonoperative management.  I recommended operative management along with IV antibiotics.  We discussed laparoscopic appendectomy. We discussed the risk and benefits of surgery including but not limited to bleeding, infection, injury to surrounding structures, need to convert to an open procedure, blood clot formation, post operative abscess or wound infection, staple line complications such as leak or bleeding, hernia formation, post operative ileus, need for additional procedures, anesthesia complications, and the typical postoperative course. I explained that the patient should expect a good improvement in their symptoms. D/w pt dc instructions  Discussed that he is higher risk for blood clots given h/o PE and blood clotting disorder  Will give 40mg  subcu lovenox preop given his history, scds Iv abx Preop oral tylenol/gabapentin  Leighton Ruff. Redmond Pulling, MD, FACS General, Bariatric, & Minimally Invasive Surgery [facility]   Greer Pickerel

## 2019-09-16 LAB — BASIC METABOLIC PANEL
Anion gap: 9 (ref 5–15)
BUN: 26 mg/dL — ABNORMAL HIGH (ref 8–23)
CO2: 22 mmol/L (ref 22–32)
Calcium: 8.5 mg/dL — ABNORMAL LOW (ref 8.9–10.3)
Chloride: 107 mmol/L (ref 98–111)
Creatinine, Ser: 1.18 mg/dL (ref 0.61–1.24)
GFR calc Af Amer: >60 mL/min (ref >60)
GFR calc non Af Amer: >60 mL/min (ref >60)
Glucose, Bld: 128 mg/dL — ABNORMAL HIGH (ref 70–99)
Potassium: 4.6 mmol/L (ref 3.5–5.1)
Sodium: 138 mmol/L (ref 135–145)

## 2019-09-16 LAB — CBC
HCT: 39.9 % (ref 39.0–52.0)
Hemoglobin: 13.1 g/dL (ref 13.0–17.0)
MCH: 28.9 pg (ref 26.0–34.0)
MCHC: 32.8 g/dL (ref 30.0–36.0)
MCV: 88.1 fL (ref 80.0–100.0)
Platelets: 233 10*3/uL (ref 150–400)
RBC: 4.53 MIL/uL (ref 4.22–5.81)
RDW: 13.2 % (ref 11.5–15.5)
WBC: 17.9 10*3/uL — ABNORMAL HIGH (ref 4.0–10.5)
nRBC: 0 % (ref 0.0–0.2)

## 2019-09-16 LAB — SURGICAL PATHOLOGY

## 2019-09-16 MED ORDER — ENSURE ENLIVE PO LIQD
237.0000 mL | Freq: Two times a day (BID) | ORAL | Status: DC
  Administered 2019-09-16 (×2): 237 mL via ORAL

## 2019-09-16 NOTE — Progress Notes (Signed)
[facility] Progress Note  1 Day Post-Op  Subjective: CC-  Feeling ok this morning. Abdomen sore but pain well controlled. Denies nausea, vomiting, or bloating. Passed some gas last night, none yet this morning. No BM. Tolerating clears and feels hungry. On nasal canula over night. Coughed up some phlegm last night. Denies SOB, but states that he has some difficulty taking in a deep breath due to abdominal pain.  Objective: Vital signs in last 24 hours: Temp:  [97.4 F (36.3 C)-98.8 F (37.1 C)] 97.4 F (36.3 C) (04/16 0800) Pulse Rate:  [45-87] 45 (04/16 0700) Resp:  [9-22] 12 (04/16 0700) BP: (90-127)/(59-80) 90/62 (04/16 0700) SpO2:  [86 %-98 %] 92 % (04/16 0700) FiO2 (%):  [50 %] 50 % (04/15 1600) Weight:  [114.2 kg] 114.2 kg (04/15 1734) Last BM Date: 09/14/19  Intake/Output from previous day: 04/15 0701 - 04/16 0700 In: 2409.9 [I.V.:2149.5; IV Piggyback:260.4] Out: 75 [Urine:50; Blood:25] Intake/Output this shift: No intake/output data recorded.  PE: Gen:  Alert, NAD, pleasant HEENT: EOM's intact, pupils equal and round Card:  bradycardia Pulm:  CTAB, no W/R/R, rate and effort normal Abd: Soft, mild distension, +BS, lap incisions cdi, nontender  Lab Results:  Recent Labs    09/14/19 2016 09/16/19 0228  WBC 14.9* 17.9*  HGB 15.6 13.1  HCT 45.9 39.9  PLT 305 233   BMET Recent Labs    09/14/19 2016 09/16/19 0228  NA 134* 138  K 3.9 4.6  CL 99 107  CO2 23 22  GLUCOSE 117* 128*  BUN 12 26*  CREATININE 0.93 1.18  CALCIUM 8.8* 8.5*   PT/INR No results for input(s): LABPROT, INR in the last 72 hours. CMP     Component Value Date/Time   NA 138 09/16/2019 0228   NA 139 06/16/2014 1206   K 4.6 09/16/2019 0228   K 4.4 06/16/2014 1206   CL 107 09/16/2019 0228   CO2 22 09/16/2019 0228   CO2 24 06/16/2014 1206   GLUCOSE 128 (H) 09/16/2019 0228   GLUCOSE 111 06/16/2014 1206   BUN 26 (H) 09/16/2019 0228   BUN 17.2 06/16/2014 1206    CREATININE 1.18 09/16/2019 0228   CREATININE 0.9 06/16/2014 1206   CALCIUM 8.5 (L) 09/16/2019 0228   CALCIUM 8.9 06/16/2014 1206   PROT 7.2 09/14/2019 2016   PROT 6.5 06/16/2014 1206   ALBUMIN 4.0 09/14/2019 2016   ALBUMIN 3.9 06/16/2014 1206   AST 18 09/14/2019 2016   AST 19 06/16/2014 1206   ALT 23 09/14/2019 2016   ALT 22 06/16/2014 1206   ALKPHOS 71 09/14/2019 2016   ALKPHOS 74 06/16/2014 1206   BILITOT 2.4 (H) 09/14/2019 2016   BILITOT 0.92 06/16/2014 1206   GFRNONAA >60 09/16/2019 0228   GFRAA >60 09/16/2019 0228   Lipase     Component Value Date/Time   LIPASE 24 09/14/2019 2016       Studies/Results: CT ABDOMEN PELVIS W CONTRAST  Result Date: 09/14/2019 CLINICAL DATA:  Right lower quadrant pain EXAM: CT ABDOMEN AND PELVIS WITH CONTRAST TECHNIQUE: Multidetector CT imaging of the abdomen and pelvis was performed using the standard protocol following bolus administration of intravenous contrast. CONTRAST:  130mL OMNIPAQUE IOHEXOL 300 MG/ML  SOLN COMPARISON:  August 2018 FINDINGS: Lower chest: Bibasilar atelectasis. Partially imaged focally prominent pleural fat along the lateral chest wall. Moderate hiatal hernia. Hepatobiliary: Too small to characterize hypoattenuating lesion of the right and left lobes of the liver. Small cyst of the caudate. Single  subcentimeter gallbladder calculus is again noted. No biliary dilatation. Pancreas: Unremarkable. Spleen: Numerous calcified granulomas are again noted. Otherwise unremarkable. Adrenals/Urinary Tract: Adrenals are unremarkable. Bilateral renal cysts and too small to characterize hypoattenuating lesions. Bladder is poorly distended but otherwise unremarkable. Stomach/Bowel: Stomach is unremarkable apart from hiatal hernia. Bowel is normal in caliber. There is distal colonic diverticulosis. The appendix is distended particularly more distally beyond a subcentimeter calculus. There is surrounding fat infiltration. Vascular/Lymphatic:  Aortic atherosclerosis. No enlarged abdominal or pelvic lymph nodes. Reproductive: Unremarkable. Other: No abscess. Musculoskeletal: Degenerative changes of the spine. No acute osseous abnormality. IMPRESSION: Acute appendicitis.  No abscess. Chronic findings detailed above. These results were called by telephone at the time of interpretation on 09/14/2019 at 9:15 pm to provider DAN FLOYD , who verbally acknowledged these results. Electronically Signed   By: Macy Mis M.D.   On: 09/14/2019 21:15   DG Chest Port 1 View  Result Date: 09/15/2019 CLINICAL DATA:  Decreased oxygen saturation EXAM: PORTABLE CHEST 1 VIEW COMPARISON:  April 13, 2012 FINDINGS: There are apparent granulomas in the right base. There is mild left base scarring. No edema or airspace opacity. Heart is borderline enlarged with pulmonary vascularity normal. No adenopathy. No pneumothorax. No bone lesions. IMPRESSION: Slight scarring left base. Granulomas right base. No edema or airspace opacity. Mild cardiac enlargement. Electronically Signed   By: Lowella Grip III M.D.   On: 09/15/2019 14:59    Anti-infectives: Anti-infectives (From admission, onward)   Start     Dose/Rate Route Frequency Ordered Stop   09/15/19 1830  piperacillin-tazobactam (ZOSYN) IVPB 3.375 g     3.375 g 12.5 mL/hr over 240 Minutes Intravenous Every 8 hours 09/15/19 1746     09/15/19 1745  piperacillin-tazobactam (ZOSYN) IVPB 3.375 g  Status:  Discontinued     3.375 g 100 mL/hr over 30 Minutes Intravenous Every 8 hours 09/15/19 1741 09/15/19 1745   09/15/19 1101  sodium chloride 0.9 % with cefTRIAXone (ROCEPHIN) ADS Med    Note to Pharmacy: Virgia Land   : cabinet override      09/15/19 1101 09/15/19 1741   09/15/19 0600  cefTRIAXone (ROCEPHIN) 2 g in sodium chloride 0.9 % 100 mL IVPB     2 g 200 mL/hr over 30 Minutes Intravenous On call to O.R. 09/15/19 0131 09/15/19 0756   09/15/19 0600  metroNIDAZOLE (FLAGYL) IVPB 500 mg     500 mg 100  mL/hr over 60 Minutes Intravenous On call to O.R. 09/15/19 0131 09/15/19 0916   09/14/19 2130  piperacillin-tazobactam (ZOSYN) IVPB 3.375 g     3.375 g 100 mL/hr over 30 Minutes Intravenous  Once 09/14/19 2116 09/14/19 2208       Assessment/Plan GERD HLD Hx prostate cancer Acute respiratory distress - required Bipap postop, only on  over night. Continue pulm toilet, add flutter valve and IS. Wean to room air  Perforated appendicitis S/p laparoscopic appendectomy 4/15 Dr. Redmond Pulling - POD#1 - mobilize - Advance to full liquids and await improved return in bowel function prior to advancing - continue IV abx (needs 5 days abx postop) - low UOP but bladder scan shows no urine, Cr ok, may be dehydrated, continue IVF @ 75cc/hr, strict I&O - transfer out of SDU - labs in AM  ID - rocephin/flagyl 4/15, zosyn 4/15>>day#2 FEN - IVF @ 75cc/hr, FLD VTE - SCDs, lovenox Foley - none Follow up - [facility]   LOS: 1 day    Wellington Hampshire, [facility] 09/16/2019, 8:49 AM Please see Amion for pager number during day hours 7:00am-4:30pm

## 2019-09-16 NOTE — Progress Notes (Signed)
Pt arrived to room 1402. Resting comfortably in chair. Vitals are stable, telemetry applied. Agree with previous RN assessment.   Zavier Canela, Bing Neighbors, RN

## 2019-09-17 LAB — BASIC METABOLIC PANEL
Anion gap: 9 (ref 5–15)
BUN: 22 mg/dL (ref 8–23)
CO2: 26 mmol/L (ref 22–32)
Calcium: 8.5 mg/dL — ABNORMAL LOW (ref 8.9–10.3)
Chloride: 103 mmol/L (ref 98–111)
Creatinine, Ser: 1.01 mg/dL (ref 0.61–1.24)
GFR calc Af Amer: >60 mL/min (ref >60)
GFR calc non Af Amer: >60 mL/min (ref >60)
Glucose, Bld: 113 mg/dL — ABNORMAL HIGH (ref 70–99)
Potassium: 3.9 mmol/L (ref 3.5–5.1)
Sodium: 138 mmol/L (ref 135–145)

## 2019-09-17 LAB — CBC
HCT: 38.3 % — ABNORMAL LOW (ref 39.0–52.0)
Hemoglobin: 12.7 g/dL — ABNORMAL LOW (ref 13.0–17.0)
MCH: 28.8 pg (ref 26.0–34.0)
MCHC: 33.2 g/dL (ref 30.0–36.0)
MCV: 86.8 fL (ref 80.0–100.0)
Platelets: 268 10*3/uL (ref 150–400)
RBC: 4.41 MIL/uL (ref 4.22–5.81)
RDW: 13.2 % (ref 11.5–15.5)
WBC: 12.6 10*3/uL — ABNORMAL HIGH (ref 4.0–10.5)
nRBC: 0 % (ref 0.0–0.2)

## 2019-09-17 MED ORDER — AMOXICILLIN-POT CLAVULANATE 875-125 MG PO TABS: 1.0000 | ORAL_TABLET | Freq: Two times a day (BID) | ORAL | 0 refills | Status: AC

## 2019-09-17 MED ORDER — TRAMADOL HCL 50 MG PO TABS: 50.0000 mg | ORAL_TABLET | Freq: Four times a day (QID) | ORAL | 0 refills | Status: DC | PRN

## 2019-09-17 NOTE — Progress Notes (Signed)
Patient ID: Nicholas Owen, male   DOB: 04-Nov-1945, 74 y.o.   MRN: [id number]   Feeling better today Minimal pain Tolerating fulls Having BM's Abdomen soft, minimally tender WBC decreasing  Plan: Discharge home after lunch on oral antibiotics

## 2019-09-17 NOTE — Plan of Care (Signed)

## 2019-09-17 NOTE — Discharge Summary (Signed)
Physician Discharge Summary  Patient ID: Nicholas Owen MRN: [id number] DOB/AGE: September 30, 1945 74 y.o.  Admit date: 09/14/2019 Discharge date: 09/17/2019  Admission Diagnoses:  Discharge Diagnoses:  Active Problems:   Acute appendicitis   Discharged Condition: good  Hospital Course: admitted with appendicitis.  Taken to OR.  Tolerated surgery well.  WBC decreasing post op.  Tolerating po and having bowel function.  Diet advanced.  Discharged home POD #2  Consults: None  Significant Diagnostic Studies:   Treatments: surgery: laparoscopic appendectomy  Discharge Exam: Blood pressure 116/75, pulse 63, temperature 97.9 F (36.6 C), temperature source Oral, resp. rate 19, height 6' (1.829 m), weight 114.2 kg, SpO2 95 %. General appearance: alert, cooperative and no distress Resp: clear to auscultation bilaterally Cardio: regular rate and rhythm, S1, S2 normal, no murmur, click, rub or gallop Incision/Wound:abdomen soft, incisions clean  Disposition: Discharge disposition: 01-Home or Self Care        Allergies as of 09/17/2019      Reactions   Nsaids    With high doses and extended period of time can cause colitis      Medication List    TAKE these medications   amoxicillin-clavulanate 875-125 MG tablet Commonly known as: Augmentin Take 1 tablet by mouth 2 (two) times daily for 5 days.   aspirin 325 MG tablet Take 325 mg by mouth daily.   co-enzyme Q-10 50 MG capsule Take 200 mg by mouth daily.   loratadine 10 MG tablet Commonly known as: CLARITIN Take 10 mg by mouth daily.   multivitamin with minerals tablet Take 1 tablet by mouth daily.   omeprazole 20 MG capsule Commonly known as: PRILOSEC TAKE 1 CAPSULE EVERY DAY   rosuvastatin 10 MG tablet Commonly known as: CRESTOR Take 1 tablet (10 mg total) by mouth daily.   traMADol 50 MG tablet Commonly known as: Ultram Take 1 tablet (50 mg total) by mouth every 6 (six) hours as needed for moderate pain or  severe pain.   vitamin C 1000 MG tablet Take 1,000 mg by mouth daily.      Follow-up [facility], [place]. Go on 10/04/2019.   Specialty: General Surgery Why: Your appointment is 05/04 at 11 am Please arrive 30 minutes prior to your appointment to check in and fill out paperwork. Bring photo ID and insurance information. Contact information: [address] [place] [phone]          Signed: Coralie Keens 09/17/2019, 8:27 AM

## 2019-10-13 DIAGNOSIS — Z8546 Personal history of malignant neoplasm of prostate: Secondary | ICD-10-CM | POA: Diagnosis not present

## 2019-10-14 DIAGNOSIS — H524 Presbyopia: Secondary | ICD-10-CM | POA: Diagnosis not present

## 2019-10-14 DIAGNOSIS — H2513 Age-related nuclear cataract, bilateral: Secondary | ICD-10-CM | POA: Diagnosis not present

## 2019-10-14 DIAGNOSIS — H349 Unspecified retinal vascular occlusion: Secondary | ICD-10-CM | POA: Diagnosis not present

## 2019-10-14 DIAGNOSIS — H5203 Hypermetropia, bilateral: Secondary | ICD-10-CM | POA: Diagnosis not present

## 2019-10-20 DIAGNOSIS — N393 Stress incontinence (female) (male): Secondary | ICD-10-CM | POA: Diagnosis not present

## 2019-10-20 DIAGNOSIS — N5231 Erectile dysfunction following radical prostatectomy: Secondary | ICD-10-CM | POA: Diagnosis not present

## 2019-10-20 DIAGNOSIS — Z8546 Personal history of malignant neoplasm of prostate: Secondary | ICD-10-CM | POA: Diagnosis not present

## 2019-10-28 ENCOUNTER — Telehealth: Payer: Self-pay | Admitting: Family Medicine

## 2019-10-28 MED ORDER — ROSUVASTATIN CALCIUM 10 MG PO TABS: 10.0000 mg | ORAL_TABLET | Freq: Every day | ORAL | 3 refills | Status: DC

## 2019-10-28 NOTE — Telephone Encounter (Signed)
°  LAST APPOINTMENT DATE: 08/29/2019   NEXT APPOINTMENT DATE:@9 /29/2021  MEDICATION:rosuvastatin (CRESTOR) 10 MG tablet  [place], [place]  **Let patient know to contact pharmacy at the end of the day to make sure medication is ready. **  ** Please notify patient to allow 48-72 hours to process**  **Encourage patient to contact the pharmacy for refills or they can request refills through [facility]**  CLINICAL FILLS OUT ALL BELOW:   LAST REFILL:  QTY:  REFILL DATE:    OTHER COMMENTS:    Okay for refill?  Please advise

## 2019-10-28 NOTE — Telephone Encounter (Signed)
Medication sent in. 

## 2020-02-29 ENCOUNTER — Other Ambulatory Visit: Payer: Self-pay

## 2020-02-29 ENCOUNTER — Encounter: Payer: Self-pay | Admitting: Family Medicine

## 2020-02-29 ENCOUNTER — Ambulatory Visit (INDEPENDENT_AMBULATORY_CARE_PROVIDER_SITE_OTHER): Payer: Medicare HMO | Admitting: Family Medicine

## 2020-02-29 VITALS — BP 128/70 | HR 66 | Temp 98.7°F | Resp 18 | Ht 72.0 in | Wt 255.4 lb

## 2020-02-29 DIAGNOSIS — E785 Hyperlipidemia, unspecified: Secondary | ICD-10-CM

## 2020-02-29 DIAGNOSIS — R739 Hyperglycemia, unspecified: Secondary | ICD-10-CM

## 2020-02-29 DIAGNOSIS — Z8709 Personal history of other diseases of the respiratory system: Secondary | ICD-10-CM

## 2020-02-29 DIAGNOSIS — K219 Gastro-esophageal reflux disease without esophagitis: Secondary | ICD-10-CM

## 2020-02-29 DIAGNOSIS — D6862 Lupus anticoagulant syndrome: Secondary | ICD-10-CM | POA: Diagnosis not present

## 2020-02-29 DIAGNOSIS — Z23 Encounter for immunization: Secondary | ICD-10-CM | POA: Diagnosis not present

## 2020-02-29 DIAGNOSIS — Z87898 Personal history of other specified conditions: Secondary | ICD-10-CM

## 2020-02-29 NOTE — Patient Instructions (Addendum)
Health Maintenance Due  Topic Date Due  . COVID-19 Vaccine (1)   I think it would be a great idea to consider vaccination if you do not have antibodies.  Even if you have antibodies it would give you additional protection.   [email or web address] general he is like listening to flu shot today Never done  . INFLUENZA VACCINE In office flu shot high dose 01/01/2020   voltaren/diclofenac gel can be tried topically for the knee  Please stop by lab before you go If you have mychart- we will send your results within 3 business days of [place] receiving them.  If you do not have mychart- we will call you about results within 5 business days of [place] receiving them.  *please note we are currently using [facility] which has a longer processing time than [place] typically so labs may not come back as quickly as in the past *please also note that you will see labs on mychart as soon as they post. I will later go in and write notes on them- will say "notes from Dr. Yong Channel"

## 2020-02-29 NOTE — Addendum Note (Signed)
Addended by: Thomes Cake on: 02/29/2020 11:32 AM   Modules accepted: Orders

## 2020-02-29 NOTE — Progress Notes (Signed)
Phone [phone] In person visit   Subjective:   Nicholas Owen is a 74 y.o. year old very pleasant male patient who presents for/with See problem oriented charting Chief Complaint  Patient presents with  . Hyperlipidemia  . Hyperglycemia  . Gastroesophageal Reflux   This visit occurred during the SARS-CoV-2 public health emergency.  Safety protocols were in place, including screening questions prior to the visit, additional usage of staff PPE, and extensive cleaning of exam room while observing appropriate contact time as indicated for disinfecting solutions.   Past Medical History-  Patient Active Problem List   Diagnosis Date Noted  . 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 History of DVT of lower extremity 08/31/2012    Priority: High  . History of pulmonary embolism 04/13/2012    Priority: High  . History of prostate cancer 05/14/2011    Priority: High  . Diverticulitis 05/19/2018    Priority: Medium  . Urinary incontinence 09/20/2014    Priority: Medium  . Hyperglycemia 05/12/2012    Priority: Medium  . GERD 10/04/2007    Priority: Medium  . Hyperlipidemia 03/08/2007    Priority: Medium  . Diverticulosis 03/22/2014    Priority: Low  . Erectile dysfunction 03/22/2014    Priority: Low  . Personal history of colonic polyps 08/31/2012    Priority: Low  . Basal cell carcinoma     Priority: Low  . Obesity 10/08/2011    Priority: Low  . 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 Kidney stone 06/02/2010    Priority: Low  . Allergic rhinitis 10/04/2007    Priority: Low  . Acute appendicitis 09/15/2019  . Elevated blood pressure reading 04/29/2016    Medications- reviewed and updated Current Outpatient Medications  Medication Sig Dispense Refill  . Ascorbic Acid (VITAMIN C) 1000 MG tablet Take 1,000 mg by mouth daily.    Marland Kitchen aspirin 325 MG tablet Take 325 mg by mouth daily.    Marland Kitchen co-enzyme Q-10 50 MG capsule  Take 200 mg by mouth daily.    Marland Kitchen loratadine (CLARITIN) 10 MG tablet Take 10 mg by mouth as needed (During allergy season).     . Multiple Vitamins-Minerals (MULTIVITAMIN WITH MINERALS) tablet Take 1 tablet by mouth daily.    Marland Kitchen omeprazole (PRILOSEC) 20 MG capsule TAKE 1 CAPSULE EVERY DAY (Patient taking differently: Take 20 mg by mouth daily. ) 90 capsule 1  . rosuvastatin (CRESTOR) 10 MG tablet Take 1 tablet (10 mg total) by mouth daily. 90 tablet 3  . traMADol (ULTRAM) 50 MG tablet Take 1 tablet (50 mg total) by mouth every 6 (six) hours as needed for moderate pain or severe pain. (Patient not taking: Reported on 02/29/2020) 25 tablet 0   No current facility-administered medications for this visit.     Objective:  BP 128/70   Pulse 66   Temp 98.7 F (37.1 C) (Temporal)   Resp 18   Ht 6' (1.829 m)   Wt 255 lb 6.4 oz (115.8 kg)   SpO2 97%   BMI 34.64 kg/m  Gen: NAD, resting comfortably CV: RRR no murmurs rubs or gallops Lungs: CTAB no crackles, wheeze, rhonchi Abdomen: soft/nontender/nondistended/normal bowel sounds.  Ext: trace edema Skin: warm, dry Neuro: grossly normal, moves all extremities     Assessment and Plan   # flu shot today. Declines covid vaccine for now- wants to see if he has antibodies  #Lupus anticoagulant disorder with history of pulmonary  embolism and history of DVT of lower extremity-patient remains on aspirin 325 mg alone.  No recent issues such as calf swelling or chest pain or shortness of breath . Continue current meds  #hyperlipidemia S: Medication: Rosuvastatin 10Mg , CoQ-10 50Mg .  Previously on pravastatin 20 mg Lab Results  Component Value Date   CHOL 167 08/29/2019   HDL 38.30 (L) 08/29/2019   LDLCALC 110 (H) 08/29/2019   TRIG 95.0 08/29/2019   CHOLHDL 4 08/29/2019   A/P: Suspect will be improved-hoping for LDL at least under 100 if not below 70-update lipid panel with labs today  # Hyperglycemia/insulin resistance/prediabetes S:   Medication: none Exercise and diet-  not as active at home- goes to [facility] 3-4 x a week, states weight is up due to muscle gain Lab Results  Component Value Date   HGBA1C 6.0 08/29/2019   HGBA1C 6.1 11/04/2016   HGBA1C 6.0 09/20/2014   A/P: hopefully improved with improved exercise- update a1c today  # GERD S:Medication: Prilosec 20Mg .  We will consider Pepcid once he loses some weight-weight is up slightly from last visit though  B12 levels related to PPI use: Lab Results  Component Value Date   VITAMINB12 244 08/29/2019  A/P: prefers to stay on same dose for now.    # Right knee pain S:Tramadol as needed in past- has not had to use lately but knee still bothers him A/P: stable- continue to monitor.  Does think he is more sedentary due to the knee- discussed trying voltareen or icing  #sees urology for PSA still- history prostate cancer- down to once a year  Recommended follow up: Return in about 6 months (around 08/28/2020) for physical or sooner if needed.  Lab/Order associations:   ICD-10-CM   1. Hyperlipidemia, unspecified hyperlipidemia type  E78.5 LDL cholesterol, direct    COMPLETE METABOLIC PANEL WITH GFR  2. Gastroesophageal reflux disease, unspecified whether esophagitis present  K21.9   3. Hyperglycemia  R73.9 Hemoglobin A1c  4. History of chronic cough  Z87.09 SARS CoV2 Serology(COVID19) AB(IgG,IgM),Immunoassay  5. 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  [name], MD

## 2020-03-01 LAB — COMPLETE METABOLIC PANEL WITH GFR
AG Ratio: 2.4 (calc) (ref 1.0–2.5)
ALT: 25 U/L (ref 9–46)
AST: 19 U/L (ref 10–35)
Albumin: 4.5 g/dL (ref 3.6–5.1)
Alkaline phosphatase (APISO): 69 U/L (ref 35–144)
BUN: 15 mg/dL (ref 7–25)
CO2: 24 mmol/L (ref 20–32)
Calcium: 9.3 mg/dL (ref 8.6–10.3)
Chloride: 105 mmol/L (ref 98–110)
Creat: 0.98 mg/dL (ref 0.70–1.18)
GFR, Est African American: 89 mL/min/{1.73_m2} (ref >=60)
GFR, Est Non African American: 77 mL/min/{1.73_m2} (ref >=60)
Globulin: 1.9 g/dL (calc) (ref 1.9–3.7)
Glucose, Bld: 104 mg/dL — ABNORMAL HIGH (ref 65–99)
Potassium: 4.7 mmol/L (ref 3.5–5.3)
Sodium: 139 mmol/L (ref 135–146)
Total Bilirubin: 1.2 mg/dL (ref 0.2–1.2)
Total Protein: 6.4 g/dL (ref 6.1–8.1)

## 2020-03-01 LAB — HEMOGLOBIN A1C
Hgb A1c MFr Bld: 6 % of total Hgb — ABNORMAL HIGH (ref <5.7)
Mean Plasma Glucose: 126 (calc)
eAG (mmol/L): 7 (calc)

## 2020-03-01 LAB — SARS COV-2 SEROLOGY(COVID-19)AB(IGG,IGM),IMMUNOASSAY
SARS CoV-2 AB IgG: NEGATIVE
SARS CoV-2 IgM: NEGATIVE

## 2020-03-01 LAB — LDL CHOLESTEROL, DIRECT: Direct LDL: 78 mg/dL (ref <100)

## 2020-03-26 ENCOUNTER — Other Ambulatory Visit: Payer: Self-pay | Admitting: Family Medicine

## 2020-06-15 DIAGNOSIS — M25562 Pain in left knee: Secondary | ICD-10-CM | POA: Insufficient documentation

## 2020-06-15 DIAGNOSIS — M170 Bilateral primary osteoarthritis of knee: Secondary | ICD-10-CM | POA: Diagnosis not present

## 2020-06-15 DIAGNOSIS — M1712 Unilateral primary osteoarthritis, left knee: Secondary | ICD-10-CM | POA: Diagnosis not present

## 2020-07-16 ENCOUNTER — Emergency Department (HOSPITAL_BASED_OUTPATIENT_CLINIC_OR_DEPARTMENT_OTHER): Payer: Medicare HMO

## 2020-07-16 ENCOUNTER — Other Ambulatory Visit: Payer: Self-pay

## 2020-07-16 ENCOUNTER — Encounter (HOSPITAL_BASED_OUTPATIENT_CLINIC_OR_DEPARTMENT_OTHER): Payer: Self-pay | Admitting: *Deleted

## 2020-07-16 ENCOUNTER — Inpatient Hospital Stay (HOSPITAL_BASED_OUTPATIENT_CLINIC_OR_DEPARTMENT_OTHER)
Admission: EM | Admit: 2020-07-16 | Discharge: 2020-07-20 | DRG: 175 | Disposition: A | Discharge status: Home / Self Care | Payer: Medicare HMO | Attending: Internal Medicine | Admitting: Internal Medicine

## 2020-07-16 DIAGNOSIS — J9601 Acute respiratory failure with hypoxia: Secondary | ICD-10-CM | POA: Diagnosis present

## 2020-07-16 DIAGNOSIS — Z20822 Contact with and (suspected) exposure to covid-19: Secondary | ICD-10-CM | POA: Diagnosis present

## 2020-07-16 DIAGNOSIS — Z8042 Family history of malignant neoplasm of prostate: Secondary | ICD-10-CM

## 2020-07-16 DIAGNOSIS — I2602 Saddle embolus of pulmonary artery with acute cor pulmonale: Secondary | ICD-10-CM

## 2020-07-16 DIAGNOSIS — Z7982 Long term (current) use of aspirin: Secondary | ICD-10-CM | POA: Diagnosis not present

## 2020-07-16 DIAGNOSIS — Z8546 Personal history of malignant neoplasm of prostate: Secondary | ICD-10-CM

## 2020-07-16 DIAGNOSIS — Z9079 Acquired absence of other genital organ(s): Secondary | ICD-10-CM | POA: Diagnosis not present

## 2020-07-16 DIAGNOSIS — Z86718 Personal history of other venous thrombosis and embolism: Secondary | ICD-10-CM

## 2020-07-16 DIAGNOSIS — I248 Other forms of acute ischemic heart disease: Secondary | ICD-10-CM | POA: Diagnosis present

## 2020-07-16 DIAGNOSIS — I2699 Other pulmonary embolism without acute cor pulmonale: Secondary | ICD-10-CM | POA: Diagnosis not present

## 2020-07-16 DIAGNOSIS — Z86711 Personal history of pulmonary embolism: Secondary | ICD-10-CM

## 2020-07-16 DIAGNOSIS — I2609 Other pulmonary embolism with acute cor pulmonale: Secondary | ICD-10-CM | POA: Diagnosis not present

## 2020-07-16 DIAGNOSIS — D6861 Antiphospholipid syndrome: Secondary | ICD-10-CM | POA: Diagnosis present

## 2020-07-16 DIAGNOSIS — R0600 Dyspnea, unspecified: Secondary | ICD-10-CM

## 2020-07-16 DIAGNOSIS — Z85828 Personal history of other malignant neoplasm of skin: Secondary | ICD-10-CM | POA: Diagnosis not present

## 2020-07-16 DIAGNOSIS — Z79899 Other long term (current) drug therapy: Secondary | ICD-10-CM | POA: Diagnosis not present

## 2020-07-16 DIAGNOSIS — Z87442 Personal history of urinary calculi: Secondary | ICD-10-CM | POA: Diagnosis not present

## 2020-07-16 DIAGNOSIS — J8410 Pulmonary fibrosis, unspecified: Secondary | ICD-10-CM | POA: Diagnosis not present

## 2020-07-16 DIAGNOSIS — R0602 Shortness of breath: Secondary | ICD-10-CM | POA: Diagnosis not present

## 2020-07-16 DIAGNOSIS — I2692 Saddle embolus of pulmonary artery without acute cor pulmonale: Secondary | ICD-10-CM | POA: Diagnosis not present

## 2020-07-16 DIAGNOSIS — Z8719 Personal history of other diseases of the digestive system: Secondary | ICD-10-CM

## 2020-07-16 DIAGNOSIS — E785 Hyperlipidemia, unspecified: Secondary | ICD-10-CM | POA: Diagnosis present

## 2020-07-16 DIAGNOSIS — Z800 Family history of malignant neoplasm of digestive organs: Secondary | ICD-10-CM | POA: Diagnosis not present

## 2020-07-16 DIAGNOSIS — K449 Diaphragmatic hernia without obstruction or gangrene: Secondary | ICD-10-CM | POA: Diagnosis not present

## 2020-07-16 DIAGNOSIS — I517 Cardiomegaly: Secondary | ICD-10-CM | POA: Diagnosis not present

## 2020-07-16 DIAGNOSIS — R0902 Hypoxemia: Secondary | ICD-10-CM | POA: Diagnosis not present

## 2020-07-16 DIAGNOSIS — K219 Gastro-esophageal reflux disease without esophagitis: Secondary | ICD-10-CM | POA: Diagnosis present

## 2020-07-16 LAB — CBC WITH DIFFERENTIAL/PLATELET
Abs Immature Granulocytes: 0.07 10*3/uL (ref 0.00–0.07)
Basophils Absolute: 0.1 10*3/uL (ref 0.0–0.1)
Basophils Relative: 1 %
Eosinophils Absolute: 0.3 10*3/uL (ref 0.0–0.5)
Eosinophils Relative: 3 %
HCT: 44.8 % (ref 39.0–52.0)
Hemoglobin: 15.3 g/dL (ref 13.0–17.0)
Immature Granulocytes: 1 %
Lymphocytes Relative: 14 %
Lymphs Abs: 1.4 10*3/uL (ref 0.7–4.0)
MCH: 29.2 pg (ref 26.0–34.0)
MCHC: 34.2 g/dL (ref 30.0–36.0)
MCV: 85.5 fL (ref 80.0–100.0)
Monocytes Absolute: 0.8 10*3/uL (ref 0.1–1.0)
Monocytes Relative: 8 %
Neutro Abs: 7.4 10*3/uL (ref 1.7–7.7)
Neutrophils Relative %: 73 %
Platelets: 259 10*3/uL (ref 150–400)
RBC: 5.24 MIL/uL (ref 4.22–5.81)
RDW: 13.1 % (ref 11.5–15.5)
WBC: 9.9 10*3/uL (ref 4.0–10.5)
nRBC: 0 % (ref 0.0–0.2)

## 2020-07-16 LAB — RESP PANEL BY RT-PCR (FLU A&B, COVID) ARPGX2
Influenza A by PCR: NEGATIVE
Influenza B by PCR: NEGATIVE
SARS Coronavirus 2 by RT PCR: NEGATIVE

## 2020-07-16 LAB — COMPREHENSIVE METABOLIC PANEL
ALT: 34 U/L (ref 0–44)
AST: 25 U/L (ref 15–41)
Albumin: 4 g/dL (ref 3.5–5.0)
Alkaline Phosphatase: 71 U/L (ref 38–126)
Anion gap: 10 (ref 5–15)
BUN: 21 mg/dL (ref 8–23)
CO2: 24 mmol/L (ref 22–32)
Calcium: 9 mg/dL (ref 8.9–10.3)
Chloride: 103 mmol/L (ref 98–111)
Creatinine, Ser: 0.97 mg/dL (ref 0.61–1.24)
GFR, Estimated: >60 mL/min (ref >60)
Glucose, Bld: 110 mg/dL — ABNORMAL HIGH (ref 70–99)
Potassium: 4.3 mmol/L (ref 3.5–5.1)
Sodium: 137 mmol/L (ref 135–145)
Total Bilirubin: 1 mg/dL (ref 0.3–1.2)
Total Protein: 6.9 g/dL (ref 6.5–8.1)

## 2020-07-16 LAB — BRAIN NATRIURETIC PEPTIDE: B Natriuretic Peptide: 67.2 pg/mL (ref 0.0–100.0)

## 2020-07-16 LAB — GLUCOSE, CAPILLARY: Glucose-Capillary: 93 mg/dL (ref 70–99)

## 2020-07-16 LAB — TROPONIN I (HIGH SENSITIVITY)
Troponin I (High Sensitivity): 820 ng/L (ref <18)
Troponin I (High Sensitivity): 901 ng/L (ref <18)

## 2020-07-16 MED ORDER — HEPARIN (PORCINE) 25000 UT/250ML-% IV SOLN
1700.0000 [IU]/h | INTRAVENOUS | Status: DC
  Administered 2020-07-16: 21:00:00 1800 [IU]/h via INTRAVENOUS
  Administered 2020-07-17 – 2020-07-19 (×3): 1700 [IU]/h via INTRAVENOUS
  Filled 2020-07-16 (×5): qty 250

## 2020-07-16 MED ORDER — CHLORHEXIDINE GLUCONATE CLOTH 2 % EX PADS
6.0000 | MEDICATED_PAD | Freq: Every day | CUTANEOUS | Status: DC
  Administered 2020-07-17 – 2020-07-19 (×3): 6 via TOPICAL

## 2020-07-16 MED ORDER — HEPARIN BOLUS VIA INFUSION
6000.0000 [IU] | Freq: Once | INTRAVENOUS | Status: AC
  Administered 2020-07-16: 6000 [IU] via INTRAVENOUS

## 2020-07-16 MED ORDER — IOHEXOL 350 MG/ML SOLN
100.0000 mL | Freq: Once | INTRAVENOUS | Status: AC | PRN
  Administered 2020-07-16: 100 mL via INTRAVENOUS

## 2020-07-16 NOTE — ED Provider Notes (Signed)
Care assumed from [facility].  At time of transfer of care, patient is awaiting for results of CT PE scan.  I was able to review the patient's imaging and it shows a saddle PE.  Radiology called to report that CT PE study does not fact show a saddle PE with right heart strain with a ratio of 1.4.  Troponin also rose to 901. Patient was started on heparin after PE was seen.  Critical care was called who recommended direct admission to the ICU at [facility]  They will evaluate the patient and determine if he needs IR intervention.  They agree with heparinization and admission.  I will place my request for him.   CRITICAL CARE Performed by: Gwenyth Allegra Lanitra Battaglini Total critical care time: 35 minutes Critical care time was exclusive of separately billable procedures and treating other patients. Critical care was necessary to treat or prevent imminent or life-threatening deterioration. Critical care was time spent personally by me on the following activities: development of treatment plan with patient and/or surrogate as well as nursing, discussions with consultants, evaluation of patient's response to treatment, examination of patient, obtaining history from patient or surrogate, ordering and performing treatments and interventions, ordering and review of laboratory studies, ordering and review of radiographic studies, pulse oximetry and re-evaluation of patient's condition.   Clinical Impression: 1. Hypoxia   2. Acute saddle pulmonary embolism with acute cor pulmonale (HCC)     Disposition: Admit  This note was prepared with assistance of Dragon voice recognition software. Occasional wrong-word or sound-a-like substitutions may have occurred due to the inherent limitations of voice recognition software.      Jae Skeet, Gwenyth Allegra, MD 07/17/20 [phone]

## 2020-07-16 NOTE — ED Triage Notes (Addendum)
C/o SOB x 1 day , pulse ox 87 % amb

## 2020-07-16 NOTE — Progress Notes (Signed)
ANTICOAGULATION CONSULT NOTE - Initial Consult  Pharmacy Consult for heparin  Indication: pulmonary embolus  Allergies  Allergen Reactions  . Nsaids     With high doses and extended period of time can cause colitis    Patient Measurements: Height: 6' (182.9 cm) Weight: 113.4 kg (250 lb) IBW/kg (Calculated) : 77.6 Heparin Dosing Weight: 113 kg   Vital Signs: Temp: 97.9 F (36.6 C) (02/14 1725) Temp Source: Oral (02/14 1725) BP: 135/75 (02/14 1858) Pulse Rate: 77 (02/14 1901)  Labs: Recent Labs    07/16/20 1739  HGB 15.3  HCT 44.8  PLT 259  CREATININE 0.97  TROPONINIHS 820*    Estimated Creatinine Clearance: 88.2 mL/min (by C-G formula based on SCr of 0.97 mg/dL).   Medical History: Past Medical History:  Diagnosis Date  . Allergy   . Asbestos exposure   . Basal cell carcinoma    skin of left ear  . BPH (benign prostatic hyperplasia)   . Cancer ([place])    basal cell CA (skin)  . Colitis 01/2011  . Colon polyp   . Deviated nasal septum    hx. of  . Diverticulosis   . DVT (deep venous thrombosis) ([place])   . GERD (gastroesophageal reflux disease)   . Hyperlipidemia   . Kidney stones   . Lipoma 12-24-11   multiple(present- lipoma 2-lt./4- rt arms/ back/ chest  . Liver cyst 10/11   4 simple cysts in liver  . Prostate cancer ([place]) 10/06/11   Gleason 3+4=7, vol 39 cc, PSA 5.15  . Prostate cancer ([place]) 12-24-11   dx. Prostate cancer after bx.  . Ulcerative colitis ([place])     Medications:  (Not in a hospital admission)   Assessment: 40 YOM who presents with shortness of breath found to have acute PE with R heart strain.   H/H and Plt wnl. SCr wnl.   Goal of Therapy:  Heparin level 0.3-0.7 units/ml Monitor platelets by anticoagulation protocol: Yes   Plan:  -Heparin 6000 units IV bolus followed by heparin infusion at 1900 units/hr -F/u 8 hr HL  -Monitor daily HL, CBC and s/s of bleeding   Albertina Parr, PharmD., BCPS, BCCCP Clinical  Pharmacist Please refer to [facility] for unit-specific pharmacist

## 2020-07-16 NOTE — H&P (Addendum)
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     History of Present Illness:  Oxygen saturation dropped to 80s with ambulation, improved to 90s at rest.  Presented to day for acute sob.  Dyspnea while walking to car.  Denies any orthopnea, cough, fever, nausea, vomiting, abdominal pain.  Denies any chest pain or palpitations.     Takes aspirin.  Stopped taking xarelto about 6 years ago.  Has been sedentary for the past year since moved into retirement community.  More sedentary in the last three weeks since he injured his L knee on the stationary bike from overuse (feels better since steroid injection there). Has been spending about 14 hours a day in his recliner. Does ambulate to bathroom and cooks/prepares food, does ADLs.       CT showed large saddle PE>  Trop 820 -->901 BNP 67,2  Past Medical History:  DVT/PE HLD Prostate cancer with prostatectomy UC  Allergies GERD nephrolithiasis  Meds; vit C  Asa 325, loratiadine  MVI Prilosec, crestor Ultram  Significant Hospital Events:    Consults:    Procedures:    Significant Diagnostic Tests:  CT PE: Large, saddle type pulmonary embolism. Positive for acute PE with CT evidence of right heart strain (RV/LV Ratio = 1.4) consistent with at least submassive (intermediate risk) PE. The presence of right heart strain has been associated with an increased risk of morbidity and mortality. Please refer to the "PE Focused" order set in EPIC.  Hiatal hernia.  EKG: no stemi  Echo 2013:  - Left ventricle: The cavity size was mildly dilated. Wall  thickness was increased in a pattern of mild LVH. Systolic  function was normal. The estimated ejection fraction was  in the  range of 60% to 65%.    Micro Data:    Antimicrobials:    Interim History / Subjective:    Objective   Blood pressure (!) 131/97, pulse 80, temperature 97.9 F (36.6 C), temperature source Oral, resp. rate (!) 24, height 6' (1.829 m), weight 113.4 kg, SpO2 95 %.       No intake or output data in the 24 hours ending 07/16/20 2253 Filed Weights   07/16/20 1724  Weight: 113.4 kg    Examination: General: NAD pleasant HENT: NCAT Lungs: CTAB Cardiovascular: RRR no MGR  Abdomen: nt, nd, nbs Extremities: no edema, no erythema, no tenderness Neuro: a and O x 3  GU: condom cath  Resolved Hospital Problem list     Assessment & Plan:  Saddle PE: vital signs normal.  Some RV strain on CT.  Dyspnea on any exertion, hypoxia on any exertion.   Monitor in ICU overnight given profound hypoxia and the size of the PE.  Trop elevated.  Echo and LE doppler pending.  No need for TPA right now.  Will monitor for changes in BP or symptoms overnight in ICU.  Cont heparin gtt.   Best practice (evaluated daily)  Diet: reg Pain/Anxiety/Delirium protocol (if indicated):  VAP protocol (if indicated):  DVT prophylaxis: on full dose  GI prophylaxis: protonix Glucose control:  Mobility: bedrest Disposition:ICU   Goals of Care:  Last date of multidisciplinary goals of care discussion: Family and staff present:  Summary of discussion:  Follow up goals of care discussion due:  Code Status: Full   Labs   CBC: Recent Labs  Lab 07/16/20 1739  WBC 9.9  NEUTROABS 7.4  HGB 15.3  HCT 44.8  MCV 85.5  PLT 818    Basic Metabolic Panel: Recent Labs  Lab 07/16/20 1739  NA 137  K 4.3  CL 103  CO2 24  GLUCOSE 110*  BUN 21  CREATININE 0.97  CALCIUM 9.0   GFR: Estimated Creatinine Clearance: 88.2 mL/min (by C-G formula based on SCr of 0.97 mg/dL). Recent Labs  Lab 07/16/20 1739  WBC 9.9    Liver Function Tests: Recent Labs  Lab 07/16/20 1739  AST 25  ALT 34  ALKPHOS  71  BILITOT 1.0  PROT 6.9  ALBUMIN 4.0   No results for input(s): LIPASE, AMYLASE in the last 168 hours. No results for input(s): AMMONIA in the last 168 hours.  ABG    Component Value Date/Time   PHART 7.477 (H) 04/13/2012 0027   PCO2ART 31.9 (L) 04/13/2012 0027   PO2ART 74.0 (L) 04/13/2012 0027   HCO3 23.8 04/13/2012 0027   TCO2 25 04/13/2012 0027   O2SAT 96.0 04/13/2012 0027     Coagulation Profile: No results for input(s): INR, PROTIME in the last 168 hours.  Cardiac Enzymes: No results for input(s): CKTOTAL, CKMB, CKMBINDEX, TROPONINI in the last 168 hours.  HbA1C: Hgb A1c MFr Bld  Date/Time Value Ref Range Status  02/29/2020 11:40 AM 6.0 (H) <5.7 % of total Hgb Final    Comment:    For someone without known diabetes, a hemoglobin  A1c value between 5.7% and 6.4% is consistent with prediabetes and should be confirmed with a  follow-up test. . For someone with known diabetes, a value <7% indicates that their diabetes is well controlled. A1c targets should be individualized based on duration of diabetes, age, comorbid conditions, and other considerations. . This assay result is consistent with an increased risk of diabetes. . Currently, no consensus exists regarding use of hemoglobin A1c for diagnosis of diabetes for children. Marland Kitchen   08/29/2019 11:59 AM 6.0 4.6 - 6.5 % Final    Comment:    Glycemic Control Guidelines for People with Diabetes:Non Diabetic:  <6%Goal of Therapy: <7%Additional Action Suggested:  >8%     CBG: No results for input(s): GLUCAP in the last 168 hours.  Review of Systems:   Negative  Review of Systems  Constitutional: Negative for fever.  HENT: Negative for hearing loss.   Eyes: Negative for blurred vision.  Respiratory: Positive for shortness of breath. Negative for cough, hemoptysis and sputum production.   Cardiovascular: Negative for chest pain.  Gastrointestinal: Negative for blood in stool, constipation, diarrhea, heartburn,  melena and nausea.  Genitourinary: Negative for dysuria.  Musculoskeletal: Negative for myalgias.  Skin: Negative for rash.  Neurological: Negative for dizziness.  Endo/Heme/Allergies: Does not bruise/bleed easily.  Psychiatric/Behavioral: Negative for depression.     Past Medical History:  He,  has a past medical history of Allergy, Asbestos exposure, Basal cell carcinoma, BPH (benign prostatic hyperplasia), Cancer ([place]), Colitis (01/2011), Colon polyp, Deviated nasal septum, Diverticulosis, DVT (deep venous thrombosis) ([place]), GERD (gastroesophageal reflux disease), Hyperlipidemia, Kidney stones, Lipoma (12-24-11), Liver cyst (10/11), Prostate cancer ([place]) (10/06/11), Prostate cancer ([place]) (12-24-11), and Ulcerative colitis ([place]).   Surgical History:   Past Surgical History:  Procedure Laterality Date   COLONOSCOPY     CYSTOSCOPY  02/16/2012   Procedure: CYSTOSCOPY FLEXIBLE;  Surgeon: Bernestine Amass, MD;  Location: [facility];  Service: Urology;  Laterality: N/A;  FLEX CYSTO WITH REMOVAL OF STAPLES    INGUINAL HERNIA REPAIR  1990's   double   KNEE SURGERY  2007   right knee scope   LAPAROSCOPIC APPENDECTOMY N/A 09/15/2019   Procedure: APPENDECTOMY LAPAROSCOPIC;  Surgeon: Greer Pickerel, MD;  Location: [facility];  Service: General;  Laterality: N/A;   lipoma removals  1990's   multiple and multiple remains now   LYMPH NODE DISSECTION  12/31/2011   Procedure: LYMPH NODE DISSECTION;  Surgeon: Bernestine Amass, MD;  Location: [facility];  Service: Urology;  Laterality: Bilateral;  Bilateral   POLYPECTOMY     ROBOT ASSISTED LAPAROSCOPIC RADICAL PROSTATECTOMY  12/31/2011   Procedure: ROBOTIC ASSISTED LAPAROSCOPIC RADICAL PROSTATECTOMY;  Surgeon: Bernestine Amass, MD;  [facility];  Service: Urology;  Laterality: N/A;       TONSILLECTOMY       Social History:   reports that he has never smoked. He has never used smokeless tobacco. He reports current alcohol use. He reports  that he does not use drugs.   Family History:  His family history includes Colon cancer (age of onset: 39) in his father; Colon cancer (age of onset: 35) in his mother; Esophageal cancer in his paternal grandfather; Prostate cancer in his paternal grandfather. There is no history of Stomach cancer or Rectal cancer.   Allergies Allergies  Allergen Reactions   Nsaids     With high doses and extended period of time can cause colitis     Home Medications  Prior to Admission medications   Medication Sig Start Date End Date Taking? Authorizing Provider  Ascorbic Acid (VITAMIN C) 1000 MG tablet Take 1,000 mg by mouth daily.    [provider]  aspirin 325 MG tablet Take 325 mg by mouth daily.    [provider]  co-enzyme Q-10 50 MG capsule Take 200 mg by mouth daily.    [provider]  loratadine (CLARITIN) 10 MG tablet Take 10 mg by mouth as needed (During allergy season).     [provider]  Multiple Vitamins-Minerals (MULTIVITAMIN WITH MINERALS) tablet Take 1 tablet by mouth daily.    [provider]  omeprazole (PRILOSEC) 20 MG capsule TAKE 1 CAPSULE EVERY DAY 03/26/20   Marin Olp, MD  rosuvastatin (CRESTOR) 10 MG tablet Take 1 tablet (10 mg total) by mouth daily. 10/28/19   Marin Olp, MD  traMADol (ULTRAM) 50 MG tablet Take 1 tablet (50 mg total) by mouth every 6 (six) hours as needed for moderate pain or severe pain. Patient not taking: Reported on 02/29/2020 09/17/19   Coralie Keens, MD     Critical care time:  65 minutes.

## 2020-07-16 NOTE — ED Provider Notes (Addendum)
[facility] EMERGENCY DEPARTMENT Provider Note   CSN: [id number] Arrival date & time: 07/16/20  1710     History Chief Complaint  Patient presents with  . Shortness of Breath    Nicholas Owen is a 75 y.o. male with pertinent past medical history of DVT, PE, hyperlipidemia, allergies, prostate cancer with prostatectomy, ulcerative colitis that presents to the emergency department today for shortness of breath.  Patient states that he was in his normal health when he woke up this morning, states that he started having immediate onset shortness of breath when he started to walk to his car.  States that shortness of breath resolved after he got into his car, once he got out of his car to go to [employer] he started having severe shortness of breath again, had to sit down to catch his breath.  States this never happened him before.  Denies any orthopnea, cough, fever, nausea, vomiting, abdominal pain.  Denies any chest pain or palpitations.  States that he has been pretty sedentary for the past year since he has been retired.  States that he does have history of DVT or PE, is on aspirin and other anticoagulant.  Nursing note states that when patient ambulated to his room his pulse was 87%, when he got into the bed it was around 94 to 97%.  Patient states that he has not been vaccinated against COVID.  Denies any URI symptoms including no myalgias fevers or cough.  Denies any sick contacts.  Denies any recent long travel or coagulation disorder, however per chart review patient does have history of antiphospholipid syndrome.  Currently patient states that he is not short of breath while sitting in the room.  HPI     Past Medical History:  Diagnosis Date  . Allergy   . Asbestos exposure   . Basal cell carcinoma    skin of left ear  . BPH (benign prostatic hyperplasia)   . Cancer ([place])    basal cell CA (skin)  . Colitis 01/2011  . Colon polyp   . Deviated nasal septum    hx. of  .  Diverticulosis   . DVT (deep venous thrombosis) ([place])   . GERD (gastroesophageal reflux disease)   . Hyperlipidemia   . Kidney stones   . Lipoma 12-24-11   multiple(present- lipoma 2-lt./4- rt arms/ back/ chest  . Liver cyst 10/11   4 simple cysts in liver  . Prostate cancer ([place]) 10/06/11   Gleason 3+4=7, vol 39 cc, PSA 5.15  . Prostate cancer ([place]) 12-24-11   dx. Prostate cancer after bx.  . Ulcerative colitis [facility])     Patient Active Problem List   Diagnosis Date Noted  . Acute appendicitis 09/15/2019  . Diverticulitis 05/19/2018  . Elevated blood pressure reading 04/29/2016  . Lupus anticoagulant disorder ([place]) 04/30/2015  . Urinary incontinence 09/20/2014  . Diverticulosis 03/22/2014  . Erectile dysfunction 03/22/2014  . History of DVT of lower extremity 08/31/2012  . Personal history of colonic polyps 08/31/2012  . Hyperglycemia 05/12/2012  . History of pulmonary embolism 04/13/2012  . Basal cell carcinoma   . Obesity 10/08/2011  . History of prostate cancer 05/14/2011  . 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 Kidney stone 06/02/2010  . Allergic rhinitis 10/04/2007  . GERD 10/04/2007  . Hyperlipidemia 03/08/2007    Past Surgical History:  Procedure Laterality Date  . COLONOSCOPY    . CYSTOSCOPY  02/16/2012   Procedure: CYSTOSCOPY FLEXIBLE;  Surgeon: Bernestine Amass,  MD;  Location: [facility];  Service: Urology;  Laterality: N/A;  FLEX CYSTO WITH REMOVAL OF STAPLES   . INGUINAL HERNIA REPAIR  1990's   double  . KNEE SURGERY  2007   right knee scope  . LAPAROSCOPIC APPENDECTOMY N/A 09/15/2019   Procedure: APPENDECTOMY LAPAROSCOPIC;  Surgeon: Greer Pickerel, MD;  [facility];  Service: General;  Laterality: N/A;  . lipoma removals  1990's   multiple and multiple remains now  . LYMPH NODE DISSECTION  12/31/2011   Procedure: LYMPH NODE DISSECTION;  Surgeon: Bernestine Amass, MD;  [facility];  Service: Urology;  Laterality: Bilateral;   Bilateral  . POLYPECTOMY    . ROBOT ASSISTED LAPAROSCOPIC RADICAL PROSTATECTOMY  12/31/2011   Procedure: ROBOTIC ASSISTED LAPAROSCOPIC RADICAL PROSTATECTOMY;  Surgeon: Bernestine Amass, MD;  [facility];  Service: Urology;  Laterality: N/A;      . TONSILLECTOMY         Family History  Problem Relation Age of Onset  . Colon cancer Mother 81  . Colon cancer Father 82  . Prostate cancer Paternal Grandfather   . Esophageal cancer Paternal Grandfather   . Stomach cancer Neg Hx   . Rectal cancer Neg Hx     Social History   Tobacco Use  . Smoking status: Never Smoker  . Smokeless tobacco: Never Used  . Tobacco comment: never used tobacco  Vaping Use  . Vaping Use: Never used  Substance Use Topics  . Alcohol use: Yes    Comment: occasional   . Drug use: No    Home Medications Prior to Admission medications   Medication Sig Start Date End Date Taking? Authorizing Provider  Ascorbic Acid (VITAMIN C) 1000 MG tablet Take 1,000 mg by mouth daily.    [provider]  aspirin 325 MG tablet Take 325 mg by mouth daily.    [provider]  co-enzyme Q-10 50 MG capsule Take 200 mg by mouth daily.    [provider]  loratadine (CLARITIN) 10 MG tablet Take 10 mg by mouth as needed (During allergy season).     [provider]  Multiple Vitamins-Minerals (MULTIVITAMIN WITH MINERALS) tablet Take 1 tablet by mouth daily.    [provider]  omeprazole (PRILOSEC) 20 MG capsule TAKE 1 CAPSULE EVERY DAY 03/26/20   Marin Olp, MD  rosuvastatin (CRESTOR) 10 MG tablet Take 1 tablet (10 mg total) by mouth daily. 10/28/19   Marin Olp, MD  traMADol (ULTRAM) 50 MG tablet Take 1 tablet (50 mg total) by mouth every 6 (six) hours as needed for moderate pain or severe pain. Patient not taking: Reported on 02/29/2020 09/17/19   Coralie Keens, MD    Allergies    Nsaids  Review of Systems   Review of Systems  Constitutional: Negative for  chills, diaphoresis, fatigue and fever.  HENT: Negative for congestion, sore throat and trouble swallowing.   Eyes: Negative for pain and visual disturbance.  Respiratory: Positive for shortness of breath. Negative for cough and wheezing.   Cardiovascular: Negative for chest pain, palpitations and leg swelling.  Gastrointestinal: Negative for abdominal distention, abdominal pain, diarrhea, nausea and vomiting.  Genitourinary: Negative for difficulty urinating.  Musculoskeletal: Negative for back pain, neck pain and neck stiffness.  Skin: Negative for pallor.  Neurological: Negative for dizziness, speech difficulty, weakness and headaches.  Psychiatric/Behavioral: Negative for confusion.    Physical Exam Updated Vital Signs BP 135/75   Pulse 77  Temp 97.9 F (36.6 C) (Oral)   Resp 15   Ht 6' (1.829 m)   Wt 113.4 kg   SpO2 96%   BMI 33.91 kg/m   Physical Exam Constitutional:      General: He is not in acute distress.    Appearance: Normal appearance. He is not ill-appearing, toxic-appearing or diaphoretic.     Comments: No acute distress.  HENT:     Mouth/Throat:     Mouth: Mucous membranes are moist.     Pharynx: Oropharynx is clear.  Eyes:     General: No scleral icterus.    Extraocular Movements: Extraocular movements intact.     Pupils: Pupils are equal, round, and reactive to light.  Cardiovascular:     Rate and Rhythm: Normal rate and regular rhythm.     Pulses: Normal pulses.     Heart sounds: Normal heart sounds.  Pulmonary:     Effort: Pulmonary effort is normal. No accessory muscle usage or respiratory distress.     Breath sounds: Normal breath sounds. No stridor. No wheezing, rhonchi or rales.     Comments: Patient without any respiratory distress, satting at 94% on room air, when patient talks to me he does desat to around 92%.  No tachypnea or accessory muscle use. Chest:     Chest wall: No tenderness.  Abdominal:     General: Abdomen is flat. There is no  distension.     Palpations: Abdomen is soft.     Tenderness: There is no abdominal tenderness. There is no guarding or rebound.  Musculoskeletal:        General: No swelling or tenderness. Normal range of motion.     Cervical back: Normal range of motion and neck supple. No rigidity.     Right lower leg: No edema.     Left lower leg: No edema.  Skin:    General: Skin is warm and dry.     Capillary Refill: Capillary refill takes less than 2 seconds.     Coloration: Skin is not pale.  Neurological:     General: No focal deficit present.     Mental Status: He is alert and oriented to person, place, and time.  Psychiatric:        Mood and Affect: Mood normal.        Behavior: Behavior normal.     ED Results / Procedures / Treatments   Labs (all labs ordered are listed, but only abnormal results are displayed) Labs Reviewed  COMPREHENSIVE METABOLIC PANEL - Abnormal; Notable for the following components:      Result Value   Glucose, Bld 110 (*)    All other components within normal limits  TROPONIN I (HIGH SENSITIVITY) - Abnormal; Notable for the following components:   Troponin I (High Sensitivity) 820 (*)    All other components within normal limits  RESP PANEL BY RT-PCR (FLU A&B, COVID) ARPGX2  CBC WITH DIFFERENTIAL/PLATELET  BRAIN NATRIURETIC PEPTIDE  TROPONIN I (HIGH SENSITIVITY)    EKG EKG Interpretation  Date/Time:  Monday July 16 2020 17:16:17 EST Ventricular Rate:  86 PR Interval:  202 QRS Duration: 96 QT Interval:  380 QTC Calculation: 454 R Axis:   53 Text Interpretation: Normal sinus rhythm Normal ECG when compared to prior, similar apperaance. No STEMI Confirmed by Antony Blackbird [phone]) on 07/16/2020 5:30:50 PM   Radiology DG Chest 2 View  Result Date: 07/16/2020 CLINICAL DATA:  Shortness of breath for 1 day.  Hypoxia. EXAM: CHEST -  2 VIEW COMPARISON:  Chest radiograph 09/15/2019.  Chest CT 11/18/2012 FINDINGS: Stable heart size and mediastinal  contours. Retrocardiac hiatal hernia. Multiple calcified granuloma at the right lung base, chronic and unchanged. Smoothly marginated pleural based lesion in the periphery of the right mid lung corresponds to lipoma on chest CT, benign and unchanged. No acute or focal airspace disease. No pulmonary edema, pneumothorax or pleural effusion. No acute osseous abnormalities are seen. IMPRESSION: 1. No acute chest findings. 2. Retrocardiac hiatal hernia. 3. 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 Electronically Signed   By: Keith Rake M.D.   On: 07/16/2020 19:03    Procedures Procedures   Medications Ordered in ED Medications - No data to display  ED Course  I have reviewed the triage vital signs and the nursing notes.  Pertinent labs & imaging results that were available during my care of the patient were reviewed by me and considered in my medical decision making (see chart for details).  Clinical Course as of 07/16/20 1942  Mon Jul 16, 2020  1926 Troponin I (High Sensitivity) [SP]    Clinical Course User Index [SP] Alfredia Client, PA-C   MDM Rules/Calculators/A&P                         DAEMYN GARIEPY is a 75 y.o. male with pertinent past medical history of DVT, PE, hyperlipidemia, allergies, prostate cancer with prostatectomy, ulcerative colitis that presents to the emergency department today for shortness of breath.  High suspicion for PE at this time with patient's previous history and antiphospholipid syndrome.  Will obtain basic blood work and CT PE study at this time.  Patient is currently stable, hemodynamically stable, no respiratory distress.  While laying in bed he is around 92 to 97%, on ambulation he is around 87%.  Lab work today shows troponin of 820, if PE study negative will treat for NSTEMI. Ekg without any signs of STEMI. CXR interpreted by me with no acute findings. Still concerned for PE, pt is still hemodynamically stable, no  requiring oxygen at rest. COVID negative.   Pt care was handed off to Dr. Sherry Ruffing Complete history and physical and current plan have been communicated.  Please refer to their note for the remainder of ED care and ultimate disposition. Awaiting CT, pt will need to be admitted.  I discussed this case with my attending physician who cosigned this note including patient's presenting symptoms, physical exam, and planned diagnostics and interventions. Attending physician stated agreement with plan or made changes to plan which were implemented.   Attending physician assessed patient at bedside.  Final Clinical Impression(s) / ED Diagnoses Final diagnoses:  Hypoxia    Rx / DC Orders ED Discharge Orders    None           Alfredia Client, PA-C 07/16/20 2005    Tegeler, Gwenyth Allegra, MD 07/17/20 [phone]

## 2020-07-16 NOTE — ED Notes (Signed)
Date and time results received: 07/16/20 2019  Test: Troponin Critical Value: 901  Name of Provider Notified: Dr. Sherry Ruffing  Orders Received? Or Actions Taken?: no new orders

## 2020-07-16 NOTE — ED Notes (Signed)
Ambulated from lobby to triage 1.  SpO2 89% after ~30 feet.  HR 95-105, +DOE.  Recovered quickly to 95-97% after rest.

## 2020-07-17 ENCOUNTER — Inpatient Hospital Stay (HOSPITAL_COMMUNITY): Payer: Medicare HMO

## 2020-07-17 DIAGNOSIS — I2699 Other pulmonary embolism without acute cor pulmonale: Secondary | ICD-10-CM | POA: Diagnosis not present

## 2020-07-17 DIAGNOSIS — I2692 Saddle embolus of pulmonary artery without acute cor pulmonale: Secondary | ICD-10-CM | POA: Diagnosis not present

## 2020-07-17 DIAGNOSIS — I2609 Other pulmonary embolism with acute cor pulmonale: Secondary | ICD-10-CM

## 2020-07-17 LAB — CBC
HCT: 42.3 % (ref 39.0–52.0)
Hemoglobin: 14.5 g/dL (ref 13.0–17.0)
MCH: 29.2 pg (ref 26.0–34.0)
MCHC: 34.3 g/dL (ref 30.0–36.0)
MCV: 85.3 fL (ref 80.0–100.0)
Platelets: 241 10*3/uL (ref 150–400)
RBC: 4.96 MIL/uL (ref 4.22–5.81)
RDW: 13.2 % (ref 11.5–15.5)
WBC: 9.1 10*3/uL (ref 4.0–10.5)
nRBC: 0 % (ref 0.0–0.2)

## 2020-07-17 LAB — ECHOCARDIOGRAM COMPLETE
AR max vel: 3.15 cm2
AV Area VTI: 3.82 cm2
AV Area mean vel: 3.12 cm2
AV Mean grad: 3 mmHg
AV Peak grad: 6.1 mmHg
Ao pk vel: 1.23 m/s
Area-P 1/2: 4.21 cm2
Height: 72 in
MV VTI: 3.66 cm2
S' Lateral: 2.7 cm
Weight: 4045.88 oz

## 2020-07-17 LAB — MRSA PCR SCREENING: MRSA by PCR: NEGATIVE

## 2020-07-17 LAB — HEPARIN LEVEL (UNFRACTIONATED)
Heparin Unfractionated: 0.7 IU/mL (ref 0.30–0.70)
Heparin Unfractionated: 0.72 IU/mL — ABNORMAL HIGH (ref 0.30–0.70)

## 2020-07-17 MED ORDER — ROSUVASTATIN CALCIUM 20 MG PO TABS
20.0000 mg | ORAL_TABLET | Freq: Every day | ORAL | Status: DC
  Administered 2020-07-17 – 2020-07-20 (×4): 20 mg via ORAL
  Filled 2020-07-17 (×4): qty 1

## 2020-07-17 MED ORDER — DOCUSATE SODIUM 100 MG PO CAPS
100.0000 mg | ORAL_CAPSULE | Freq: Two times a day (BID) | ORAL | Status: DC | PRN
  Administered 2020-07-18: 100 mg via ORAL
  Filled 2020-07-17: qty 1

## 2020-07-17 MED ORDER — ACETAMINOPHEN 325 MG PO TABS: 650.0000 mg | ORAL_TABLET | ORAL | Status: DC | PRN

## 2020-07-17 MED ORDER — POLYETHYLENE GLYCOL 3350 17 G PO PACK
17.0000 g | PACK | Freq: Every day | ORAL | Status: DC | PRN
Start: 2020-07-17 — End: 2020-07-20

## 2020-07-17 MED ORDER — PERFLUTREN LIPID MICROSPHERE
1.0000 mL | INTRAVENOUS | Status: AC | PRN
Start: 2020-07-17 — End: 2020-07-17
  Administered 2020-07-17: 3 mL via INTRAVENOUS
  Filled 2020-07-17: qty 10

## 2020-07-17 MED ORDER — PANTOPRAZOLE SODIUM 40 MG PO TBEC
40.0000 mg | DELAYED_RELEASE_TABLET | Freq: Two times a day (BID) | ORAL | Status: DC
  Administered 2020-07-17 – 2020-07-20 (×6): 40 mg via ORAL
  Filled 2020-07-17 (×6): qty 1

## 2020-07-17 MED ORDER — PANTOPRAZOLE SODIUM 40 MG PO TBEC
40.0000 mg | DELAYED_RELEASE_TABLET | Freq: Every day | ORAL | Status: DC
  Administered 2020-07-17: 40 mg via ORAL
  Filled 2020-07-17: qty 1

## 2020-07-17 NOTE — Progress Notes (Signed)
[place] for heparin  Indication: pulmonary embolus  Allergies  Allergen Reactions  . Nsaids     With high doses and extended period of time can cause colitis    Patient Measurements: Height: 6' (182.9 cm) Weight: 114.7 kg (252 lb 13.9 oz) IBW/kg (Calculated) : 77.6 Heparin Dosing Weight: 113 kg   Vital Signs: Temp: 98.3 F (36.8 C) (02/15 0809) Temp Source: Oral (02/15 0809) BP: 111/72 (02/15 1400) Pulse Rate: 69 (02/15 1400)  Labs: Recent Labs    07/16/20 1739 07/16/20 1935 07/17/20 0352 07/17/20 1046  HGB 15.3  --  14.5  --   HCT 44.8  --  42.3  --   PLT 259  --  241  --   HEPARINUNFRC  --   --  0.70 0.72*  CREATININE 0.97  --   --   --   TROPONINIHS 820* 901*  --   --     Estimated Creatinine Clearance: 88.6 mL/min (by C-G formula based on SCr of 0.97 mg/dL).   Medical History: Past Medical History:  Diagnosis Date  . Allergy   . Asbestos exposure   . Basal cell carcinoma    skin of left ear  . BPH (benign prostatic hyperplasia)   . Cancer ([place])    basal cell CA (skin)  . Colitis 01/2011  . Colon polyp   . Deviated nasal septum    hx. of  . Diverticulosis   . DVT (deep venous thrombosis) ([place])   . GERD (gastroesophageal reflux disease)   . Hyperlipidemia   . Kidney stones   . Lipoma 12-24-11   multiple(present- lipoma 2-lt./4- rt arms/ back/ chest  . Liver cyst 10/11   4 simple cysts in liver  . 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 Prostate cancer ([place]) 12-24-11   dx. Prostate cancer after bx.  . Ulcerative colitis ([place])     Medications:  Medications Prior to Admission  Medication Sig Dispense Refill Last Dose  . Ascorbic Acid (VITAMIN C) 1000 MG tablet Take 1,000 mg by mouth daily.     Marland Kitchen aspirin 325 MG tablet Take 325 mg by mouth daily.     Marland Kitchen co-enzyme Q-10 50 MG capsule Take 200 mg by mouth daily.     Marland Kitchen loratadine (CLARITIN) 10 MG tablet Take 10 mg by mouth as needed  (During allergy season).      . Multiple Vitamins-Minerals (MULTIVITAMIN WITH MINERALS) tablet Take 1 tablet by mouth daily.     Marland Kitchen omeprazole (PRILOSEC) 20 MG capsule TAKE 1 CAPSULE EVERY DAY 90 capsule 1   . rosuvastatin (CRESTOR) 10 MG tablet Take 1 tablet (10 mg total) by mouth daily. 90 tablet 3   . traMADol (ULTRAM) 50 MG tablet Take 1 tablet (50 mg total) by mouth every 6 (six) hours as needed for moderate pain or severe pain. (Patient not taking: Reported on 02/29/2020) 25 tablet 0     Assessment: 63 YOM who presents with shortness of breath found to have acute PE with R heart strain.   Heparin level still slightly above goal on recheck this morning at 0.72. CBC stable overnight. No bleeding issues noted.   Goal of Therapy:  Heparin level 0.3-0.7 units/ml Monitor platelets by anticoagulation protocol: Yes   Plan:  -Decrease heparin to 1700 units/hr -Monitor daily HL, CBC and s/s of bleeding   Erin Hearing PharmD., BCPS Clinical Pharmacist 07/17/2020 2:39 PM

## 2020-07-17 NOTE — Progress Notes (Signed)
  Echocardiogram 2D Echocardiogram has been performed with Definity.  Nicholas Owen 07/17/2020, 9:48 AM

## 2020-07-17 NOTE — Progress Notes (Signed)
Lower extremity venous has been completed.   Preliminary results in CV Proc.   Jinny Blossom Damali Broadfoot 07/17/2020 10:00 AM

## 2020-07-17 NOTE — Progress Notes (Signed)
[place] for heparin  Indication: pulmonary embolus  Allergies  Allergen Reactions  . Nsaids     With high doses and extended period of time can cause colitis    Patient Measurements: Height: 6' (182.9 cm) Weight: 114.7 kg (252 lb 13.9 oz) IBW/kg (Calculated) : 77.6 Heparin Dosing Weight: 113 kg   Vital Signs: Temp: 98.5 F (36.9 C) (02/15 0400) Temp Source: Oral (02/15 0400) BP: 128/87 (02/15 0400) Pulse Rate: 75 (02/15 0400)  Labs: Recent Labs    07/16/20 1739 07/16/20 1935 07/17/20 0352  HGB 15.3  --  14.5  HCT 44.8  --  42.3  PLT 259  --  241  HEPARINUNFRC  --   --  0.70  CREATININE 0.97  --   --   TROPONINIHS 820* 901*  --     Estimated Creatinine Clearance: 88.6 mL/min (by C-G formula based on SCr of 0.97 mg/dL).  Assessment: 75 y.o. male with PE for heparin Goal of Therapy:  Heparin level 0.3-0.7 units/ml Monitor platelets by anticoagulation protocol: Yes   Plan:  Continue Heparin at current rate  Recheck level in 6 hours to verify  Phillis Knack, PharmD, BCPS
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
## 2020-07-18 DIAGNOSIS — I2602 Saddle embolus of pulmonary artery with acute cor pulmonale: Secondary | ICD-10-CM

## 2020-07-18 LAB — CBC
HCT: 41.3 % (ref 39.0–52.0)
Hemoglobin: 14.5 g/dL (ref 13.0–17.0)
MCH: 29.8 pg (ref 26.0–34.0)
MCHC: 35.1 g/dL (ref 30.0–36.0)
MCV: 84.8 fL (ref 80.0–100.0)
Platelets: 265 10*3/uL (ref 150–400)
RBC: 4.87 MIL/uL (ref 4.22–5.81)
RDW: 13.3 % (ref 11.5–15.5)
WBC: 8.5 10*3/uL (ref 4.0–10.5)
nRBC: 0 % (ref 0.0–0.2)

## 2020-07-18 LAB — HEPARIN LEVEL (UNFRACTIONATED): Heparin Unfractionated: 0.46 IU/mL (ref 0.30–0.70)

## 2020-07-18 NOTE — Progress Notes (Signed)
PROGRESS NOTE    Nicholas Owen  YPP:[id number] DOB: 27-Aug-1945 DOA: 07/16/2020 PCP: Marin Olp, MD   Chief Complain:  Patient is a 75 year old male with history of DVT/PE, hyperlipidemia, prostate cancer S/P prostatectomy, nephrolithiasis who presented to the emergency department with complaints of acute shortness of breath while walking to the car.  On presentation he did not report any chest pain or palpitations.  He was taking Xarelto but he stopped about 6 days ago.  Has been sedentary for the past year.  On presentation, he had elevated troponin, CT showed large saddle PE with right heart strain consistent with at least submassive PE.  Started on heparin drip.  Brief Narrative:   Assessment & Plan:   Active Problems:   Saddle pulmonary embolus (HCC)   Pulmonary embolism (HCC)   Submassive PE with right ventricular strain: Presented with dyspnea of sudden onset.  History of DVT/PE but stopped taking Xarelto about 6 years ago.  Patient was admitted under PCCM service.PCCM did not recommend EKOS. He is ambulating without problem.  Continue heparin drip, will transition to Eliquis tomorrow. He should be on lifelong anticoagulation.  Echo showed ejection fraction of 55 to 45%, grade 1 diastolic dysfunction, right ventricular pressure overload, severely hypokinetic right ventricle moderate reduction in the right ventricle system, severely enlarged right ventricular size. Patient reports that he has knee problems and is very sedentary, spending most of the time by sitting on the recliner up to 16 hours a day.  Acute respiratory failure with hypoxia: Secondary to PE.  Will try to taper the oxygen and stop  Elevated troponin: Secondary to PE.  Denies any chest pain.  No further work-up  Hyperlipidemia: Continue Crestor         DVT prophylaxis:heparin iV Code Status: Full Family Communication: None at bedside Status is: Inpatient  Remains inpatient appropriate  because:Inpatient level of care appropriate due to severity of illness   Dispo: The patient is from: Home              Anticipated d/c is to: Home              Anticipated d/c date is:1 day              Patient currently is not medically stable to d/c.   Difficult to place patient No    Consultants: PCCM  Procedures:None  Antimicrobials:  Anti-infectives (From admission, onward)   None      Subjective: Patient seen and examined the bedside this morning. Hemodynamically stable during my evaluation. Denies any shortness of breath or chest pain. Sitting on the chair. Ambulated on the hallway as per the RN.  Objective: Vitals:   07/18/20 0200 07/18/20 0400 07/18/20 0500 07/18/20 0600  BP: 118/73 114/76 116/78 115/83  Pulse: 67 65 64 65  Resp: (!) 22 (!) 21 (!) 22 15  Temp:  97.8 F (36.6 C)    TempSrc:  Oral    SpO2: 94% 96% 94% 95%  Weight:   107.7 kg   Height:        Intake/Output Summary (Last 24 hours) at 07/18/2020 0711 Last data filed at 07/18/2020 0600 Gross per 24 hour  Intake 881.1 ml  Output 1450 ml  Net -568.9 ml   Filed Weights   07/16/20 2300 07/17/20 0500 07/18/20 0500  Weight: 114.7 kg 114.7 kg 107.7 kg    Examination:  General exam: Appears calm and comfortable ,Not in distress, obese HEENT:PERRL,Oral mucosa moist, Ear/Nose normal on  gross exam Respiratory system: Bilateral equal air entry, normal vesicular breath sounds, no wheezes or crackles  Cardiovascular system: S1 & S2 heard, RRR. No JVD, murmurs, rubs, gallops or clicks. No pedal edema. Gastrointestinal system: Abdomen is nondistended, soft and nontender. No organomegaly or masses felt. Normal bowel sounds heard. Central nervous system: Alert and oriented. No focal neurological deficits. Extremities: No edema, no clubbing ,no cyanosis Skin: No rashes, lesions or ulcers,no icterus ,no pallor   Data Reviewed: I have personally reviewed following labs and imaging studies  CBC: Recent Labs   Lab 07/16/20 1739 07/17/20 0352 07/18/20 0147  WBC 9.9 9.1 8.5  NEUTROABS 7.4  --   --   HGB 15.3 14.5 14.5  HCT 44.8 42.3 41.3  MCV 85.5 85.3 84.8  PLT 259 241 299   Basic Metabolic Panel: Recent Labs  Lab 07/16/20 1739  NA 137  K 4.3  CL 103  CO2 24  GLUCOSE 110*  BUN 21  CREATININE 0.97  CALCIUM 9.0   GFR: Estimated Creatinine Clearance: 86 mL/min (by C-G formula based on SCr of 0.97 mg/dL). Liver Function Tests: Recent Labs  Lab 07/16/20 1739  AST 25  ALT 34  ALKPHOS 71  BILITOT 1.0  PROT 6.9  ALBUMIN 4.0   No results for input(s): LIPASE, AMYLASE in the last 168 hours. No results for input(s): AMMONIA in the last 168 hours. Coagulation Profile: No results for input(s): INR, PROTIME in the last 168 hours. Cardiac Enzymes: No results for input(s): CKTOTAL, CKMB, CKMBINDEX, TROPONINI in the last 168 hours. BNP (last 3 results) No results for input(s): PROBNP in the last 8760 hours. HbA1C: No results for input(s): HGBA1C in the last 72 hours. CBG: Recent Labs  Lab 07/16/20 2343  GLUCAP 93   Lipid Profile: No results for input(s): CHOL, HDL, LDLCALC, TRIG, CHOLHDL, LDLDIRECT in the last 72 hours. Thyroid Function Tests: No results for input(s): TSH, T4TOTAL, FREET4, T3FREE, THYROIDAB in the last 72 hours. Anemia Panel: No results for input(s): VITAMINB12, FOLATE, FERRITIN, TIBC, IRON, RETICCTPCT in the last 72 hours. Sepsis Labs: No results for input(s): PROCALCITON, LATICACIDVEN in the last 168 hours.  Recent Results (from the past 240 hour(s))  Resp Panel by RT-PCR (Flu A&B, Covid) Nasopharyngeal Swab     Status: None   Collection Time: 07/16/20  5:48 PM   Specimen: Nasopharyngeal Swab; Nasopharyngeal(NP) swabs in vial transport medium  Result Value Ref Range Status   SARS Coronavirus 2 by RT PCR NEGATIVE NEGATIVE Final    Comment: (NOTE) SARS-CoV-2 target nucleic acids are NOT DETECTED.  The SARS-CoV-2 RNA is generally detectable in upper  respiratory specimens during the acute phase of infection. The lowest concentration of SARS-CoV-2 viral copies this assay can detect is 138 copies/mL. A negative result does not preclude SARS-Cov-2 infection and should not be used as the sole basis for treatment or other patient management decisions. A negative result may occur with  improper specimen collection/handling, submission of specimen other than nasopharyngeal swab, presence of viral mutation(s) within the areas targeted by this assay, and inadequate number of viral copies(<138 copies/mL). A negative result must be combined with clinical observations, patient history, and epidemiological information. The expected result is Negative.  Fact Sheet for Patients:  EntrepreneurPulse.com.au  Fact Sheet for Healthcare Providers:  IncredibleEmployment.be  This test is no t yet approved or cleared by the [place] FDA and  has been authorized for detection and/or diagnosis of SARS-CoV-2 by FDA under an Emergency Use Authorization (EUA). This  EUA will remain  in effect (meaning this test can be used) for the duration of the COVID-19 declaration under Section 564(b)(1) of the Act, 21 U.S.C.section 360bbb-3(b)(1), unless the authorization is terminated  or revoked sooner.       Influenza A by PCR NEGATIVE NEGATIVE Final   Influenza B by PCR NEGATIVE NEGATIVE Final    Comment: (NOTE) The Xpert Xpress SARS-CoV-2/FLU/RSV plus assay is intended as an aid in the diagnosis of influenza from Nasopharyngeal swab specimens and should not be used as a sole basis for treatment. Nasal washings and aspirates are unacceptable for Xpert Xpress SARS-CoV-2/FLU/RSV testing.  Fact Sheet for Patients: EntrepreneurPulse.com.au  Fact Sheet for Healthcare Providers: IncredibleEmployment.be  This test is not yet approved or cleared by the [place] FDA and has been  authorized for detection and/or diagnosis of SARS-CoV-2 by FDA under an Emergency Use Authorization (EUA). This EUA will remain in effect (meaning this test can be used) for the duration of the COVID-19 declaration under Section 564(b)(1) of the Act, 21 U.S.C. section 360bbb-3(b)(1), unless the authorization is terminated or revoked.  Performed at [facility], [address] [place], [place] [address]   MRSA PCR Screening     Status: None   Collection Time: 07/16/20 10:20 PM   Specimen: Nasopharyngeal  Result Value Ref Range Status   MRSA by PCR NEGATIVE NEGATIVE Final    Comment:        The GeneXpert MRSA Assay (FDA approved for NASAL specimens only), is one component of a comprehensive MRSA colonization surveillance program. It is not intended to diagnose MRSA infection nor to guide or monitor treatment for MRSA infections. Performed at [facility], [place] [address] [place], [place] [address]          Radiology Studies: DG Chest 2 View  Result Date: 07/16/2020 CLINICAL DATA:  Shortness of breath for 1 day.  Hypoxia. EXAM: CHEST - 2 VIEW COMPARISON:  Chest radiograph 09/15/2019.  Chest CT 11/18/2012 FINDINGS: Stable heart size and mediastinal contours. Retrocardiac hiatal hernia. Multiple calcified granuloma at the right lung base, chronic and unchanged. Smoothly marginated pleural based lesion in the periphery of the right mid lung corresponds to lipoma on chest CT, benign and unchanged. No acute or focal airspace disease. No pulmonary edema, pneumothorax or pleural effusion. No acute osseous abnormalities are seen. IMPRESSION: 1. No acute chest findings. 2. Retrocardiac hiatal hernia. 3. 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 Electronically Signed   By: Keith Rake M.D.   On: 07/16/2020 19:03   CT Angio Chest PE W/Cm &/Or Wo Cm  Result Date: 07/16/2020 CLINICAL DATA:  Shortness of breath for 1 day. Decreased oxygen  saturation. EXAM: CT ANGIOGRAPHY CHEST WITH CONTRAST TECHNIQUE: Multidetector CT imaging of the chest was performed using the standard protocol during bolus administration of intravenous contrast. Multiplanar CT image reconstructions and MIPs were obtained to evaluate the vascular anatomy. CONTRAST:  174mL OMNIPAQUE IOHEXOL 350 MG/ML SOLN COMPARISON:  Plain film earlier today.  CT a chest 11/08/2012. FINDINGS: Cardiovascular: Large saddle type pulmonary emboli, including on 38/4. Right greater than left nearly occlusive lobar and segmental emboli to both lower lobes. Mild cardiomegaly.  The RV to LV ratio measures 5.6/4.0 = 1.4. Mediastinum/Nodes: Calcified mediastinal nodes are likely related to old granulomatous disease. No hilar adenopathy. Moderate hiatal hernia. Lungs/Pleura: No pleural fluid. 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 Hyperattenuating densities within the anterior right lung base  may represent clustered granulomas and were present on 11/18/2012. Upper Abdomen: Left hepatic lobe cyst is no longer identified. There is a caudate lobe 1.9 cm cyst. Normal imaged portions of the pancreas, adrenal glands. Old granulomatous disease in the spleen. A low-density right upper quadrant lesion on 100/4 is incompletely imaged, likely a cyst arising from the right kidney at 2.6 cm. Musculoskeletal: No acute osseous abnormality. Review of the MIP images confirms the above findings. IMPRESSION: Large, saddle type pulmonary embolism. Positive for acute PE with CT evidence of right heart strain (RV/LV Ratio = 1.4) consistent with at least submassive (intermediate risk) PE. The presence of right heart strain has been associated with an increased risk of morbidity and mortality. Please refer to the "PE Focused" order set in EPIC. Hiatal hernia. Critical test results telephoned to .David Stall. At the time of interpretation at 8:25 p.m.On . 07/16/2020. Electronically Signed   By: Abigail Miyamoto M.D.   On:  07/16/2020 20:25   ECHOCARDIOGRAM COMPLETE  Result Date: 07/17/2020    ECHOCARDIOGRAM REPORT   Patient Name:   Nicholas Owen Date of Exam: 07/17/2020 Medical Rec #:  [id number]         Height:       72.0 in Accession #:    [id number]        Weight:       252.9 lb Date of Birth:  August 24, 1945         BSA:          2.354 m Patient Age:    27 years          BP:           113/85 mmHg Patient Gender: M                 HR:           76 bpm. Exam Location:  Inpatient Procedure: 2D Echo, Cardiac Doppler, Color Doppler and Intracardiac            Opacification Agent Indications:    Pulmonary embolus  History:        Patient has prior history of Echocardiogram examinations, most                 recent 04/14/2012. Risk Factors:Dyslipidemia. GERD. H/O DVT.  Sonographer:    [name] Lefort RDCS (AE) Referring Phys: [id number] [facility]  Sonographer Comments: No subcostal window. Image acquisition challenging due to patient body habitus. IMPRESSIONS  1. Left ventricular ejection fraction, by estimation, is 55 to 60%. The left ventricle has normal function. The left ventricle has no regional wall motion abnormalities. There is mild concentric left ventricular hypertrophy. Left ventricular diastolic parameters are consistent with Grade I diastolic dysfunction (impaired relaxation). There is the interventricular septum is flattened in systole, consistent with right ventricular pressure overload.  2. McConnell's sign is seen, strongly suggestive of large pulmonary embolism, RV free wall is severely hypokinetic. The apex is hyperdynamic and tricuspid annulus excursion is mildly reduced. Right ventricular systolic function is moderately reduced. The right ventricular size is severely enlarged. There is mildly elevated pulmonary artery systolic pressure. The estimated right ventricular systolic pressure is 78.4 mmHg.  3. Right atrial size was mild to moderately dilated.  4. The mitral valve is normal in structure. No evidence of  mitral valve regurgitation.  5. The aortic valve is tricuspid. Aortic valve regurgitation is not visualized. No aortic stenosis is present.  6. Aortic dilatation noted. There is mild dilatation of  the ascending aorta, measuring 39 mm. Comparison(s): Prior images unable to be directly viewed, comparison made by report only. FINDINGS  Left Ventricle: Left ventricular ejection fraction, by estimation, is 55 to 60%. The left ventricle has normal function. The left ventricle has no regional wall motion abnormalities. Definity contrast agent was given IV to delineate the left ventricular  endocardial borders. The left ventricular internal cavity size was normal in size. There is mild concentric left ventricular hypertrophy. The interventricular septum is flattened in systole, consistent with right ventricular pressure overload. Left ventricular diastolic parameters are consistent with Grade I diastolic dysfunction (impaired relaxation). Normal left ventricular filling pressure. Right Ventricle: McConnell's sign is seen, strongly suggestive of large pulmonary embolism, RV free wall is severely hypokinetic. The apex is hyperdynamic and tricuspid annulus excursion is mildly reduced. The right ventricular size is severely enlarged.  No increase in right ventricular wall thickness. Right ventricular systolic function is moderately reduced. There is mildly elevated pulmonary artery systolic pressure. The tricuspid regurgitant velocity is 2.81 m/s, and with an assumed right atrial pressure of 10 mmHg, the estimated right ventricular systolic pressure is 96.7 mmHg. Left Atrium: Left atrial size was normal in size. Right Atrium: Right atrial size was mild to moderately dilated. Pericardium: There is no evidence of pericardial effusion. Mitral Valve: The mitral valve is normal in structure. There is mild thickening of the mitral valve leaflet(s). No evidence of mitral valve regurgitation. MV peak gradient, 3.0 mmHg. The mean mitral  valve gradient is 1.0 mmHg. Tricuspid Valve: The tricuspid valve is normal in structure. Tricuspid valve regurgitation is mild. Aortic Valve: The aortic valve is tricuspid. Aortic valve regurgitation is not visualized. No aortic stenosis is present. Aortic valve mean gradient measures 3.0 mmHg. Aortic valve peak gradient measures 6.1 mmHg. Aortic valve area, by VTI measures 3.82 cm. Pulmonic Valve: The pulmonic valve was not well visualized. Pulmonic valve regurgitation is not visualized. Aorta: The aortic root is normal in size and structure and aortic dilatation noted. There is mild dilatation of the ascending aorta, measuring 39 mm. Venous: The inferior vena cava was not well visualized. IAS/Shunts: The interatrial septum was not assessed.  LEFT VENTRICLE PLAX 2D LVIDd:         3.90 cm  Diastology LVIDs:         2.70 cm  LV e' medial:    6.31 cm/s LV PW:         1.50 cm  LV E/e' medial:  7.1 LV IVS:        1.20 cm  LV e' lateral:   7.29 cm/s LVOT diam:     2.20 cm  LV E/e' lateral: 6.2 LV SV:         71 LV SV Index:   30 LVOT Area:     3.80 cm  RIGHT VENTRICLE RV Basal diam:  3.65 cm RV Mid diam:    4.30 cm RV S prime:     10.80 cm/s TAPSE (M-mode): 1.8 cm LEFT ATRIUM             Index       RIGHT ATRIUM           Index LA diam:        3.60 cm 1.53 cm/m  RA Area:     22.10 cm LA Vol (A2C):   41.2 ml 17.50 ml/m RA Volume:   61.80 ml  26.25 ml/m LA Vol (A4C):   52.3 ml 22.22 ml/m LA Biplane Vol: 49.1  ml 20.86 ml/m  AORTIC VALVE AV Area (Vmax):    3.15 cm AV Area (Vmean):   3.12 cm AV Area (VTI):     3.82 cm AV Vmax:           123.00 cm/s AV Vmean:          83.600 cm/s AV VTI:            0.187 m AV Peak Grad:      6.1 mmHg AV Mean Grad:      3.0 mmHg LVOT Vmax:         102.00 cm/s LVOT Vmean:        68.700 cm/s LVOT VTI:          0.188 m LVOT/AV VTI ratio: 1.01  AORTA Ao Root diam: 3.70 cm Ao Asc diam:  3.90 cm MITRAL VALVE               TRICUSPID VALVE MV Area (PHT): 4.21 cm    TR Peak grad:   31.6 mmHg  MV Area VTI:   3.66 cm    TR Vmax:        281.00 cm/s MV Peak grad:  3.0 mmHg MV Mean grad:  1.0 mmHg    SHUNTS MV Vmax:       0.87 m/s    Systemic VTI:  0.19 m MV Vmean:      46.1 cm/s   Systemic Diam: 2.20 cm MV Decel Time: 180 msec MV E velocity: 45.00 cm/s MV A velocity: 78.40 cm/s MV E/A ratio:  0.57 Mihai Croitoru MD Electronically signed by Sanda Klein MD Signature Date/Time: 07/17/2020/10:15:10 AM    Final    VAS [place] LOWER EXTREMITY VENOUS (DVT)  Result Date: 07/17/2020  Lower Venous DVT Study Indications: Pulmonary embolism.  Comparison Study: no prior Performing Technologist: Abram Sander RVS  Examination Guidelines: A complete evaluation includes B-mode imaging, spectral Doppler, color Doppler, and power Doppler as needed of all accessible portions of each vessel. Bilateral testing is considered an integral part of a complete examination. Limited examinations for reoccurring indications may be performed as noted. The reflux portion of the exam is performed with the patient in reverse Trendelenburg.  +---------+---------------+---------+-----------+----------+--------------+ RIGHT    CompressibilityPhasicitySpontaneityPropertiesThrombus Aging +---------+---------------+---------+-----------+----------+--------------+ CFV      Full           Yes      Yes                                 +---------+---------------+---------+-----------+----------+--------------+ SFJ      Full                                                        +---------+---------------+---------+-----------+----------+--------------+ FV Prox  None                                                        +---------+---------------+---------+-----------+----------+--------------+ FV Mid   None                                                        +---------+---------------+---------+-----------+----------+--------------+  FV DistalNone                                                         +---------+---------------+---------+-----------+----------+--------------+ PFV      Full                                                        +---------+---------------+---------+-----------+----------+--------------+ POP      None           No       No                                  +---------+---------------+---------+-----------+----------+--------------+ PTV      None                                                        +---------+---------------+---------+-----------+----------+--------------+ PERO     None                                                        +---------+---------------+---------+-----------+----------+--------------+   +---------+---------------+---------+-----------+----------+--------------+ LEFT     CompressibilityPhasicitySpontaneityPropertiesThrombus Aging +---------+---------------+---------+-----------+----------+--------------+ CFV      Full           Yes      Yes                                 +---------+---------------+---------+-----------+----------+--------------+ SFJ      Full                                                        +---------+---------------+---------+-----------+----------+--------------+ FV Prox  Full                                                        +---------+---------------+---------+-----------+----------+--------------+ FV Mid   Full                                                        +---------+---------------+---------+-----------+----------+--------------+ FV DistalFull                                                        +---------+---------------+---------+-----------+----------+--------------+  PFV      Full                                                        +---------+---------------+---------+-----------+----------+--------------+ POP      Full           Yes      Yes                                  +---------+---------------+---------+-----------+----------+--------------+ PTV      Full                                                        +---------+---------------+---------+-----------+----------+--------------+ PERO     Full                                                        +---------+---------------+---------+-----------+----------+--------------+     Summary: RIGHT: - Findings consistent with age indeterminate deep vein thrombosis involving the right femoral vein, right popliteal vein, right posterior tibial veins, and right peroneal veins. - No cystic structure found in the popliteal fossa.  LEFT: - There is no evidence of deep vein thrombosis in the lower extremity.  - No cystic structure found in the popliteal fossa.  *See table(s) above for measurements and observations.    Preliminary         Scheduled Meds: . Chlorhexidine Gluconate Cloth  6 each Topical Q0600  . pantoprazole  40 mg Oral BID  . rosuvastatin  20 mg Oral Daily   Continuous Infusions: . heparin 1,700 Units/hr (07/18/20 0600)     LOS: 2 days    Time spent: 35 mins.More than 50% of that time was spent in counseling and/or coordination of care.      Shelly Coss, MD [facility] P2/16/2022, 7:11 AM

## 2020-07-18 NOTE — Plan of Care (Signed)

## 2020-07-18 NOTE — Progress Notes (Signed)
07/18/2020  I have seen and evaluated the patient for PE.  S:  Doing okay, needed 2L [place] for walking around unit.  O: Blood pressure 116/84, pulse 60, temperature 98.1 F (36.7 C), temperature source Oral, resp. rate (!) 21, height 6' (1.829 m), weight 107.7 kg, SpO2 91 %.  No acute distress Heart sounds normal Subtle increased swelling on RLE  A:  High risk submassive PE- see discussion yesterday.  Regarding other interventions, discussed catheter directed therapy vs. AC alone, the relative risks and benefits of each.  He would prefer to avoid any procedures if possible.  Hx VTE on full dose aspirin PTA  Acute hypoxemic respiratory failure related to PE, minimal O2 needs at present, hopefully can wean off prior to DC  P:  - He does not want cdTPA and I am hopeful we can get by with [facility] alone - Would keep in hospital until Thurs/Fri for O2 wean then send home on indefinite NoAC instead of full dose aspirin - If any decompensation please call us ASAP - A repeat echocardiogram in 3 months would be helpful as some of his clot seems chronic  07/18/2020 Erskine Emery MD

## 2020-07-18 NOTE — Progress Notes (Signed)
[place] for heparin  Indication: pulmonary embolus  Allergies  Allergen Reactions  . Nsaids     With high doses and extended period of time can cause colitis    Patient Measurements: Height: 6' (182.9 cm) Weight: 107.7 kg (237 lb 7 oz) IBW/kg (Calculated) : 77.6 Heparin Dosing Weight: 113 kg   Vital Signs: Temp: 98.1 F (36.7 C) (02/16 0757) Temp Source: Oral (02/16 0757) BP: 116/84 (02/16 1000) Pulse Rate: 60 (02/16 1000)  Labs: Recent Labs    07/16/20 1739 07/16/20 1935 07/17/20 0352 07/17/20 1046 07/18/20 0147  HGB 15.3  --  14.5  --  14.5  HCT 44.8  --  42.3  --  41.3  PLT 259  --  241  --  265  HEPARINUNFRC  --   --  0.70 0.72* 0.46  CREATININE 0.97  --   --   --   --   TROPONINIHS 820* 901*  --   --   --     Estimated Creatinine Clearance: 86 mL/min (by C-G formula based on SCr of 0.97 mg/dL).   Medical History: Past Medical History:  Diagnosis Date  . Allergy   . Asbestos exposure   . Basal cell carcinoma    skin of left ear  . BPH (benign prostatic hyperplasia)   . Cancer ([place])    basal cell CA (skin)  . Colitis 01/2011  . Colon polyp   . Deviated nasal septum    hx. of  . Diverticulosis   . DVT (deep venous thrombosis) ([place])   . GERD (gastroesophageal reflux disease)   . Hyperlipidemia   . Kidney stones   . Lipoma 12-24-11   multiple(present- lipoma 2-lt./4- rt arms/ back/ chest  . Liver cyst 10/11   4 simple cysts in liver  . 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 Prostate cancer ([place]) 12-24-11   dx. Prostate cancer after bx.  . Ulcerative colitis ([place])     Medications:  Medications Prior to Admission  Medication Sig Dispense Refill Last Dose  . Ascorbic Acid (VITAMIN C) 1000 MG tablet Take 1,000 mg by mouth daily.   07/16/2020 at Unknown time  . aspirin 325 MG tablet Take 325 mg by mouth daily.   07/16/2020 at Unknown time  . cholecalciferol (VITAMIN D3) 25 MCG (1000  UNIT) tablet Take 1,000 Units by mouth daily.   07/16/2020 at Unknown time  . co-enzyme Q-10 50 MG capsule Take 200 mg by mouth daily.   07/16/2020 at Unknown time  . Multiple Vitamins-Minerals (MULTIVITAMIN WITH MINERALS) tablet Take 1 tablet by mouth daily.   07/16/2020 at Unknown time  . omeprazole (PRILOSEC) 20 MG capsule TAKE 1 CAPSULE EVERY DAY (Patient taking differently: Take 20 mg by mouth daily.) 90 capsule 1 07/16/2020 at Unknown time  . rosuvastatin (CRESTOR) 10 MG tablet Take 1 tablet (10 mg total) by mouth daily. 90 tablet 3 07/16/2020 at Unknown time    Assessment: 77 YOM who presents with shortness of breath found to have acute PE with R heart strain.   Heparin level at goal on recheck this morning at 0.46. CBC stable overnight. No bleeding issues noted.   Goal of Therapy:  Heparin level 0.3-0.7 units/ml Monitor platelets by anticoagulation protocol: Yes   Plan:  Continue heparin at 1700 units/hr Monitor daily HL, CBC and s/s of bleeding  F/up transition to [facility] in am  Erin Hearing PharmD., BCPS Clinical Pharmacist  07/18/2020 11:23 AM

## 2020-07-19 LAB — CBC WITH DIFFERENTIAL/PLATELET
Abs Immature Granulocytes: 0.09 10*3/uL — ABNORMAL HIGH (ref 0.00–0.07)
Basophils Absolute: 0.1 10*3/uL (ref 0.0–0.1)
Basophils Relative: 1 %
Eosinophils Absolute: 0.4 10*3/uL (ref 0.0–0.5)
Eosinophils Relative: 4 %
HCT: 39.9 % (ref 39.0–52.0)
Hemoglobin: 13.8 g/dL (ref 13.0–17.0)
Immature Granulocytes: 1 %
Lymphocytes Relative: 14 %
Lymphs Abs: 1.2 10*3/uL (ref 0.7–4.0)
MCH: 29.1 pg (ref 26.0–34.0)
MCHC: 34.6 g/dL (ref 30.0–36.0)
MCV: 84.2 fL (ref 80.0–100.0)
Monocytes Absolute: 0.8 10*3/uL (ref 0.1–1.0)
Monocytes Relative: 9 %
Neutro Abs: 5.8 10*3/uL (ref 1.7–7.7)
Neutrophils Relative %: 71 %
Platelets: 261 10*3/uL (ref 150–400)
RBC: 4.74 MIL/uL (ref 4.22–5.81)
RDW: 13.3 % (ref 11.5–15.5)
WBC: 8.2 10*3/uL (ref 4.0–10.5)
nRBC: 0 % (ref 0.0–0.2)

## 2020-07-19 LAB — BASIC METABOLIC PANEL
Anion gap: 8 (ref 5–15)
BUN: 17 mg/dL (ref 8–23)
CO2: 25 mmol/L (ref 22–32)
Calcium: 8.8 mg/dL — ABNORMAL LOW (ref 8.9–10.3)
Chloride: 102 mmol/L (ref 98–111)
Creatinine, Ser: 1.1 mg/dL (ref 0.61–1.24)
GFR, Estimated: >60 mL/min (ref >60)
Glucose, Bld: 107 mg/dL — ABNORMAL HIGH (ref 70–99)
Potassium: 4.2 mmol/L (ref 3.5–5.1)
Sodium: 135 mmol/L (ref 135–145)

## 2020-07-19 LAB — HEPARIN LEVEL (UNFRACTIONATED): Heparin Unfractionated: 0.6 IU/mL (ref 0.30–0.70)

## 2020-07-19 MED ORDER — APIXABAN 5 MG PO TABS
10.0000 mg | ORAL_TABLET | Freq: Two times a day (BID) | ORAL | Status: DC
  Administered 2020-07-19 – 2020-07-20 (×3): 10 mg via ORAL
  Filled 2020-07-19 (×3): qty 2

## 2020-07-19 MED ORDER — APIXABAN 5 MG PO TABS: 10.0000 mg | ORAL_TABLET | Freq: Two times a day (BID) | ORAL | 0 refills | Status: DC

## 2020-07-19 MED ORDER — APIXABAN (ELIQUIS) EDUCATION KIT FOR DVT/PE PATIENTS
PACK | Freq: Once | Status: AC
  Filled 2020-07-19: qty 1

## 2020-07-19 MED ORDER — APIXABAN 5 MG PO TABS: 5.0000 mg | ORAL_TABLET | Freq: Two times a day (BID) | ORAL | Status: DC

## 2020-07-19 MED ORDER — APIXABAN 5 MG PO TABS: 5.0000 mg | ORAL_TABLET | Freq: Two times a day (BID) | ORAL | 0 refills | Status: DC

## 2020-07-19 NOTE — Progress Notes (Signed)
[place] for heparin >apixaban Indication: pulmonary embolus  Allergies  Allergen Reactions  . Nsaids     With high doses and extended period of time can cause colitis    Patient Measurements: Height: 6' (182.9 cm) Weight: 107.2 kg (236 lb 5.3 oz) IBW/kg (Calculated) : 77.6 Heparin Dosing Weight: 113 kg   Vital Signs: Temp: 98.9 F (37.2 C) (02/17 0811) Temp Source: Oral (02/17 0811) BP: 112/72 (02/17 0800) Pulse Rate: 69 (02/17 0800)  Labs: Recent Labs    07/16/20 1739 07/16/20 1739 07/16/20 1935 07/17/20 0352 07/17/20 1046 07/18/20 0147 07/19/20 0223  HGB 15.3  --   --  14.5  --  14.5 13.8  HCT 44.8  --   --  42.3  --  41.3 39.9  PLT 259  --   --  241  --  265 261  HEPARINUNFRC  --    < >  --  0.70 0.72* 0.46 0.60  CREATININE 0.97  --   --   --   --   --  1.10  TROPONINIHS 820*  --  901*  --   --   --   --    < > = values in this interval not displayed.    Estimated Creatinine Clearance: 75.6 mL/min (by C-G formula based on SCr of 1.1 mg/dL).   Medical History: Past Medical History:  Diagnosis Date  . Allergy   . Asbestos exposure   . Basal cell carcinoma    skin of left ear  . BPH (benign prostatic hyperplasia)   . Cancer ([place])    basal cell CA (skin)  . Colitis 01/2011  . Colon polyp   . Deviated nasal septum    hx. of  . Diverticulosis   . DVT (deep venous thrombosis) ([place])   . GERD (gastroesophageal reflux disease)   . Hyperlipidemia   . Kidney stones   . Lipoma 12-24-11   multiple(present- lipoma 2-lt./4- rt arms/ back/ chest  . Liver cyst 10/11   4 simple cysts in liver  . 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 Prostate cancer ([place]) 12-24-11   dx. Prostate cancer after bx.  . Ulcerative colitis ([place])     Medications:  Medications Prior to Admission  Medication Sig Dispense Refill Last Dose  . Ascorbic Acid (VITAMIN C) 1000 MG tablet Take 1,000 mg by mouth daily.    07/16/2020 at Unknown time  . aspirin 325 MG tablet Take 325 mg by mouth daily.   07/16/2020 at Unknown time  . cholecalciferol (VITAMIN D3) 25 MCG (1000 UNIT) tablet Take 1,000 Units by mouth daily.   07/16/2020 at Unknown time  . co-enzyme Q-10 50 MG capsule Take 200 mg by mouth daily.   07/16/2020 at Unknown time  . Multiple Vitamins-Minerals (MULTIVITAMIN WITH MINERALS) tablet Take 1 tablet by mouth daily.   07/16/2020 at Unknown time  . omeprazole (PRILOSEC) 20 MG capsule TAKE 1 CAPSULE EVERY DAY (Patient taking differently: Take 20 mg by mouth daily.) 90 capsule 1 07/16/2020 at Unknown time  . rosuvastatin (CRESTOR) 10 MG tablet Take 1 tablet (10 mg total) by mouth daily. 90 tablet 3 07/16/2020 at Unknown time    Assessment: 31 YOM who presents with shortness of breath found to have acute PE with R heart strain.   Heparin level at goal this morning. CBC stable overnight. No bleeding issues noted. New orders to transition to apixaban this morning  Goal of Therapy:  Heparin level 0.3-0.7 units/ml Monitor platelets by anticoagulation protocol: Yes   Plan:  Start apixaban 10mg  bid x 7 days then 5mg  bid Consult placed to [facility] to review copay Will provide education prior to d/c  Erin Hearing PharmD., BCPS Clinical Pharmacist 07/19/2020 9:18 AM

## 2020-07-19 NOTE — Discharge Summary (Signed)
Physician Discharge Summary  Nicholas Owen DOB: [birth year] DOA: 07/16/2020  PCP: Marin Olp, MD  Admit date: 07/16/2020 Discharge date: 07/19/2020  Admitted From: Home Disposition:  Home  Discharge Condition:Stable CODE STATUS:FULL Diet recommendation: Heart Healthy  Brief/Interim Summary:  Patient is a 75 year old male with history of DVT/PE, hyperlipidemia, prostate cancer S/P prostatectomy, nephrolithiasis who presented to the emergency department with complaints of acute shortness of breath while walking to the car.  On presentation he did not report any chest pain or palpitations.  He was taking Xarelto but he stopped about 6 years  ago.  Has been sedentary for the past year.  On presentation, he had elevated troponin, CT showed large saddle PE with right heart strain consistent with at least submassive PE.  Started on heparin drip which has been transitioned to Eliquis.  Most likely he will be on lifelong anticoagulation therapy.  Hemodynamically stable .  Following problems were addressed during his hospitalization:  Submassive PE with right ventricular strain: Presented with dyspnea of sudden onset.  History of DVT/PE but stopped taking Xarelto about 6 years ago.  Patient was admitted under PCCM service.PCCM did not recommend EKOS. He is ambulating without problem.  Continue heparin drip, transitioned  to Eliquis . He should be on lifelong anticoagulation.  Echo showed ejection fraction of 55 to 49%, grade 1 diastolic dysfunction, right ventricular pressure overload, severely hypokinetic right ventricle moderate reduction in the right ventricle system, severely enlarged right ventricular size. Patient reports that he has knee problems and is very sedentary, spending most of the time by sitting on the recliner up to 16 hours a day. We have recommended for regular exercise/ambulation.  Acute respiratory failure with hypoxia: Secondary to PE.  Oxygen saturation  in the range of 90 this morning.  Lungs are clear on auscultation.  Denies any shortness of breath or cough.  Elevated troponin: Secondary to PE.  Denies any chest pain.  No further work-up  Hyperlipidemia: Continue Crestor.Last LDL on goal   Discharge Diagnoses:  Active Problems:   Saddle pulmonary embolus ([place])   Pulmonary embolism [facility])    Discharge Instructions  Discharge Instructions    Diet - low sodium heart healthy   Complete by: As directed    Discharge instructions   Complete by: As directed    1)Please take the prescribed medication as instructed.   2)  Follow-up with your primary care physician in  a week   Increase activity slowly   Complete by: As directed      Allergies as of 07/19/2020      Reactions   Nsaids    With high doses and extended period of time can cause colitis      Medication List    STOP taking these medications   aspirin 325 MG tablet     TAKE these medications   apixaban 5 MG Tabs tablet Commonly known as: ELIQUIS Take 2 tablets (10 mg total) by mouth 2 (two) times daily for 6 days. Start taking on: July 20, 2020   apixaban 5 MG Tabs tablet Commonly known as: ELIQUIS Take 1 tablet (5 mg total) by mouth 2 (two) times daily. Start taking on: July 26, 2020   cholecalciferol 25 MCG (1000 UNIT) tablet Commonly known as: VITAMIN D3 Take 1,000 Units by mouth daily.   co-enzyme Q-10 50 MG capsule Take 200 mg by mouth daily.   multivitamin with minerals tablet Take 1 tablet by mouth daily.   omeprazole 20 MG  capsule Commonly known as: PRILOSEC TAKE 1 CAPSULE EVERY DAY   rosuvastatin 10 MG tablet Commonly known as: CRESTOR Take 1 tablet (10 mg total) by mouth daily.   vitamin C 1000 MG tablet Take 1,000 mg by mouth daily.       Follow-up Information    Marin Olp, MD. Schedule an appointment as soon as possible for a visit in 1 week(s).   Specialty: Family Medicine Contact information: [place] [address] [phone]              Allergies  Allergen Reactions  . Nsaids     With high doses and extended period of time can cause colitis    Consultations:  PCCM   Procedures/Studies: DG Chest 2 View  Result Date: 07/16/2020 CLINICAL DATA:  Shortness of breath for 1 day.  Hypoxia. EXAM: CHEST - 2 VIEW COMPARISON:  Chest radiograph 09/15/2019.  Chest CT 11/18/2012 FINDINGS: Stable heart size and mediastinal contours. Retrocardiac hiatal hernia. Multiple calcified granuloma at the right lung base, chronic and unchanged. Smoothly marginated pleural based lesion in the periphery of the right mid lung corresponds to lipoma on chest CT, benign and unchanged. No acute or focal airspace disease. No pulmonary edema, pneumothorax or pleural effusion. No acute osseous abnormalities are seen. IMPRESSION: 1. No acute chest findings. 2. Retrocardiac hiatal hernia. 3. 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 Electronically Signed   By: Keith Rake M.D.   On: 07/16/2020 19:03   CT Angio Chest PE W/Cm &/Or Wo Cm  Result Date: 07/16/2020 CLINICAL DATA:  Shortness of breath for 1 day. Decreased oxygen saturation. EXAM: CT ANGIOGRAPHY CHEST WITH CONTRAST TECHNIQUE: Multidetector CT imaging of the chest was performed using the standard protocol during bolus administration of intravenous contrast. Multiplanar CT image reconstructions and MIPs were obtained to evaluate the vascular anatomy. CONTRAST:  153mL OMNIPAQUE IOHEXOL 350 MG/ML SOLN COMPARISON:  Plain film earlier today.  CT a chest 11/08/2012. FINDINGS: Cardiovascular: Large saddle type pulmonary emboli, including on 38/4. Right greater than left nearly occlusive lobar and segmental emboli to both lower lobes. Mild cardiomegaly.  The RV to LV ratio measures 5.6/4.0 = 1.4. Mediastinum/Nodes: Calcified mediastinal nodes are likely related to old granulomatous disease. No hilar adenopathy.  Moderate hiatal hernia. Lungs/Pleura: No pleural fluid. 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 Hyperattenuating densities within the anterior right lung base may represent clustered granulomas and were present on 11/18/2012. Upper Abdomen: Left hepatic lobe cyst is no longer identified. There is a caudate lobe 1.9 cm cyst. Normal imaged portions of the pancreas, adrenal glands. Old granulomatous disease in the spleen. A low-density right upper quadrant lesion on 100/4 is incompletely imaged, likely a cyst arising from the right kidney at 2.6 cm. Musculoskeletal: No acute osseous abnormality. Review of the MIP images confirms the above findings. IMPRESSION: Large, saddle type pulmonary embolism. Positive for acute PE with CT evidence of right heart strain (RV/LV Ratio = 1.4) consistent with at least submassive (intermediate risk) PE. The presence of right heart strain has been associated with an increased risk of morbidity and mortality. Please refer to the "PE Focused" order set in EPIC. Hiatal hernia. Critical test results telephoned to .David Stall. At the time of interpretation at 8:25 p.m.On . 07/16/2020. Electronically Signed   By: Abigail Miyamoto M.D.   On: 07/16/2020 20:25   ECHOCARDIOGRAM COMPLETE  Result Date: 07/17/2020    ECHOCARDIOGRAM REPORT  Patient Name:   TYDUS SANMIGUEL Date of Exam: 07/17/2020 Medical Rec #:  [id number]         Height:       72.0 in Accession #:    [id number]        Weight:       252.9 lb Date of Birth:  05/18/1946         BSA:          2.354 m Patient Age:    89 years          BP:           113/85 mmHg Patient Gender: M                 HR:           76 bpm. Exam Location:  Inpatient Procedure: 2D Echo, Cardiac Doppler, Color Doppler and Intracardiac            Opacification Agent Indications:    Pulmonary embolus  History:        Patient has prior history of Echocardiogram examinations, most                 recent 04/14/2012. Risk Factors:Dyslipidemia. GERD. H/O  DVT.  Sonographer:    [name] Lefort RDCS (AE) Referring Phys: [id number] [facility]  Sonographer Comments: No subcostal window. Image acquisition challenging due to patient body habitus. IMPRESSIONS  1. Left ventricular ejection fraction, by estimation, is 55 to 60%. The left ventricle has normal function. The left ventricle has no regional wall motion abnormalities. There is mild concentric left ventricular hypertrophy. Left ventricular diastolic parameters are consistent with Grade I diastolic dysfunction (impaired relaxation). There is the interventricular septum is flattened in systole, consistent with right ventricular pressure overload.  2. McConnell's sign is seen, strongly suggestive of large pulmonary embolism, RV free wall is severely hypokinetic. The apex is hyperdynamic and tricuspid annulus excursion is mildly reduced. Right ventricular systolic function is moderately reduced. The right ventricular size is severely enlarged. There is mildly elevated pulmonary artery systolic pressure. The estimated right ventricular systolic pressure is 78.6 mmHg.  3. Right atrial size was mild to moderately dilated.  4. The mitral valve is normal in structure. No evidence of mitral valve regurgitation.  5. The aortic valve is tricuspid. Aortic valve regurgitation is not visualized. No aortic stenosis is present.  6. Aortic dilatation noted. There is mild dilatation of the ascending aorta, measuring 39 mm. Comparison(s): Prior images unable to be directly viewed, comparison made by report only. FINDINGS  Left Ventricle: Left ventricular ejection fraction, by estimation, is 55 to 60%. The left ventricle has normal function. The left ventricle has no regional wall motion abnormalities. Definity contrast agent was given IV to delineate the left ventricular  endocardial borders. The left ventricular internal cavity size was normal in size. There is mild concentric left ventricular hypertrophy. The interventricular septum is  flattened in systole, consistent with right ventricular pressure overload. Left ventricular diastolic parameters are consistent with Grade I diastolic dysfunction (impaired relaxation). Normal left ventricular filling pressure. Right Ventricle: McConnell's sign is seen, strongly suggestive of large pulmonary embolism, RV free wall is severely hypokinetic. The apex is hyperdynamic and tricuspid annulus excursion is mildly reduced. The right ventricular size is severely enlarged.  No increase in right ventricular wall thickness. Right ventricular systolic function is moderately reduced. There is mildly elevated pulmonary artery systolic pressure. The tricuspid regurgitant velocity is 2.81 m/s, and with an assumed right  atrial pressure of 10 mmHg, the estimated right ventricular systolic pressure is 44.3 mmHg. Left Atrium: Left atrial size was normal in size. Right Atrium: Right atrial size was mild to moderately dilated. Pericardium: There is no evidence of pericardial effusion. Mitral Valve: The mitral valve is normal in structure. There is mild thickening of the mitral valve leaflet(s). No evidence of mitral valve regurgitation. MV peak gradient, 3.0 mmHg. The mean mitral valve gradient is 1.0 mmHg. Tricuspid Valve: The tricuspid valve is normal in structure. Tricuspid valve regurgitation is mild. Aortic Valve: The aortic valve is tricuspid. Aortic valve regurgitation is not visualized. No aortic stenosis is present. Aortic valve mean gradient measures 3.0 mmHg. Aortic valve peak gradient measures 6.1 mmHg. Aortic valve area, by VTI measures 3.82 cm. Pulmonic Valve: The pulmonic valve was not well visualized. Pulmonic valve regurgitation is not visualized. Aorta: The aortic root is normal in size and structure and aortic dilatation noted. There is mild dilatation of the ascending aorta, measuring 39 mm. Venous: The inferior vena cava was not well visualized. IAS/Shunts: The interatrial septum was not assessed.   LEFT VENTRICLE PLAX 2D LVIDd:         3.90 cm  Diastology LVIDs:         2.70 cm  LV e' medial:    6.31 cm/s LV PW:         1.50 cm  LV E/e' medial:  7.1 LV IVS:        1.20 cm  LV e' lateral:   7.29 cm/s LVOT diam:     2.20 cm  LV E/e' lateral: 6.2 LV SV:         71 LV SV Index:   30 LVOT Area:     3.80 cm  RIGHT VENTRICLE RV Basal diam:  3.65 cm RV Mid diam:    4.30 cm RV S prime:     10.80 cm/s TAPSE (M-mode): 1.8 cm LEFT ATRIUM             Index       RIGHT ATRIUM           Index LA diam:        3.60 cm 1.53 cm/m  RA Area:     22.10 cm LA Vol (A2C):   41.2 ml 17.50 ml/m RA Volume:   61.80 ml  26.25 ml/m LA Vol (A4C):   52.3 ml 22.22 ml/m LA Biplane Vol: 49.1 ml 20.86 ml/m  AORTIC VALVE AV Area (Vmax):    3.15 cm AV Area (Vmean):   3.12 cm AV Area (VTI):     3.82 cm AV Vmax:           123.00 cm/s AV Vmean:          83.600 cm/s AV VTI:            0.187 m AV Peak Grad:      6.1 mmHg AV Mean Grad:      3.0 mmHg LVOT Vmax:         102.00 cm/s LVOT Vmean:        68.700 cm/s LVOT VTI:          0.188 m LVOT/AV VTI ratio: 1.01  AORTA Ao Root diam: 3.70 cm Ao Asc diam:  3.90 cm MITRAL VALVE               TRICUSPID VALVE MV Area (PHT): 4.21 cm    TR Peak grad:   31.6 mmHg MV Area VTI:  3.66 cm    TR Vmax:        281.00 cm/s MV Peak grad:  3.0 mmHg MV Mean grad:  1.0 mmHg    SHUNTS MV Vmax:       0.87 m/s    Systemic VTI:  0.19 m MV Vmean:      46.1 cm/s   Systemic Diam: 2.20 cm MV Decel Time: 180 msec MV E velocity: 45.00 cm/s MV A velocity: 78.40 cm/s MV E/A ratio:  0.57 Mihai Croitoru MD Electronically signed by Sanda Klein MD Signature Date/Time: 07/17/2020/10:15:10 AM    Final    VAS [place] LOWER EXTREMITY VENOUS (DVT)  Result Date: 07/18/2020  Lower Venous DVT Study Indications: Pulmonary embolism.  Comparison Study: no prior Performing Technologist: Abram Sander RVS  Examination Guidelines: A complete evaluation includes B-mode imaging, spectral Doppler, color Doppler, and power Doppler as needed of  all accessible portions of each vessel. Bilateral testing is considered an integral part of a complete examination. Limited examinations for reoccurring indications may be performed as noted. The reflux portion of the exam is performed with the patient in reverse Trendelenburg.  +---------+---------------+---------+-----------+----------+--------------+ RIGHT    CompressibilityPhasicitySpontaneityPropertiesThrombus Aging +---------+---------------+---------+-----------+----------+--------------+ CFV      Full           Yes      Yes                                 +---------+---------------+---------+-----------+----------+--------------+ SFJ      Full                                                        +---------+---------------+---------+-----------+----------+--------------+ FV Prox  None                                                        +---------+---------------+---------+-----------+----------+--------------+ FV Mid   None                                                        +---------+---------------+---------+-----------+----------+--------------+ FV DistalNone                                                        +---------+---------------+---------+-----------+----------+--------------+ PFV      Full                                                        +---------+---------------+---------+-----------+----------+--------------+ POP      None           No       No                                  +---------+---------------+---------+-----------+----------+--------------+  PTV      None                                                        +---------+---------------+---------+-----------+----------+--------------+ PERO     None                                                        +---------+---------------+---------+-----------+----------+--------------+    +---------+---------------+---------+-----------+----------+--------------+ LEFT     CompressibilityPhasicitySpontaneityPropertiesThrombus Aging +---------+---------------+---------+-----------+----------+--------------+ CFV      Full           Yes      Yes                                 +---------+---------------+---------+-----------+----------+--------------+ SFJ      Full                                                        +---------+---------------+---------+-----------+----------+--------------+ FV Prox  Full                                                        +---------+---------------+---------+-----------+----------+--------------+ FV Mid   Full                                                        +---------+---------------+---------+-----------+----------+--------------+ FV DistalFull                                                        +---------+---------------+---------+-----------+----------+--------------+ PFV      Full                                                        +---------+---------------+---------+-----------+----------+--------------+ POP      Full           Yes      Yes                                 +---------+---------------+---------+-----------+----------+--------------+ PTV      Full                                                        +---------+---------------+---------+-----------+----------+--------------+  PERO     Full                                                        +---------+---------------+---------+-----------+----------+--------------+     Summary: RIGHT: - Findings consistent with age indeterminate deep vein thrombosis involving the right femoral vein, right popliteal vein, right posterior tibial veins, and right peroneal veins. - No cystic structure found in the popliteal fossa.  LEFT: - There is no evidence of deep vein thrombosis in the lower extremity.  - No cystic structure  found in the popliteal fossa.  *See table(s) above for measurements and observations. Electronically signed by Harold Barban MD on 07/18/2020 at 9:51:22 PM.    Final        Subjective: Patient seen and examined at the bedside this morning.  Remains comfortable.  Hemodynamically stable.   Discharge Exam: Vitals:   07/19/20 0900 07/19/20 1000  BP:  124/75  Pulse: 74 68  Resp: 16 18  Temp:    SpO2: 92% 92%   Vitals:   07/19/20 0800 07/19/20 0811 07/19/20 0900 07/19/20 1000  BP: 112/72   124/75  Pulse: 69  74 68  Resp: (!) 22  16 18   Temp:  98.9 F (37.2 C)    TempSrc:  Oral    SpO2: 92%  92% 92%  Weight:      Height:        General: Pt is alert, awake, not in acute distress,obese Cardiovascular: RRR, S1/S2 +, no rubs, no gallops Respiratory: CTA bilaterally, no wheezing, no rhonchi Abdominal: Soft, NT, ND, bowel sounds + Extremities: no edema, no cyanosis    The results of significant diagnostics from this hospitalization (including imaging, microbiology, ancillary and laboratory) are listed below for reference.     Microbiology: Recent Results (from the past 240 hour(s))  Resp Panel by RT-PCR (Flu A&B, Covid) Nasopharyngeal Swab     Status: None   Collection Time: 07/16/20  5:48 PM   Specimen: Nasopharyngeal Swab; Nasopharyngeal(NP) swabs in vial transport medium  Result Value Ref Range Status   SARS Coronavirus 2 by RT PCR NEGATIVE NEGATIVE Final    Comment: (NOTE) SARS-CoV-2 target nucleic acids are NOT DETECTED.  The SARS-CoV-2 RNA is generally detectable in upper respiratory specimens during the acute phase of infection. The lowest concentration of SARS-CoV-2 viral copies this assay can detect is 138 copies/mL. A negative result does not preclude SARS-Cov-2 infection and should not be used as the sole basis for treatment or other patient management decisions. A negative result may occur with  improper specimen collection/handling, submission of specimen  other than nasopharyngeal swab, presence of viral mutation(s) within the areas targeted by this assay, and inadequate number of viral copies(<138 copies/mL). A negative result must be combined with clinical observations, patient history, and epidemiological information. The expected result is Negative.  Fact Sheet for Patients:  EntrepreneurPulse.com.au  Fact Sheet for Healthcare Providers:  IncredibleEmployment.be  This test is no t yet approved or cleared by the [place] FDA and  has been authorized for detection and/or diagnosis of SARS-CoV-2 by FDA under an Emergency Use Authorization (EUA). This EUA will remain  in effect (meaning this test can be used) for the duration of the COVID-19 declaration under Section 564(b)(1) of the Act, 21 U.S.C.section 360bbb-3(b)(1), unless the authorization is terminated  or revoked sooner.       Influenza A by PCR NEGATIVE NEGATIVE Final   Influenza B by PCR NEGATIVE NEGATIVE Final    Comment: (NOTE) The Xpert Xpress SARS-CoV-2/FLU/RSV plus assay is intended as an aid in the diagnosis of influenza from Nasopharyngeal swab specimens and should not be used as a sole basis for treatment. Nasal washings and aspirates are unacceptable for Xpert Xpress SARS-CoV-2/FLU/RSV testing.  Fact Sheet for Patients: EntrepreneurPulse.com.au  Fact Sheet for Healthcare Providers: IncredibleEmployment.be  This test is not yet approved or cleared by the [place] FDA and has been authorized for detection and/or diagnosis of SARS-CoV-2 by FDA under an Emergency Use Authorization (EUA). This EUA will remain in effect (meaning this test can be used) for the duration of the COVID-19 declaration under Section 564(b)(1) of the Act, 21 U.S.C. section 360bbb-3(b)(1), unless the authorization is terminated or revoked.  Performed at [facility], [address]  [place], [place] [address]   MRSA PCR Screening     Status: None   Collection Time: 07/16/20 10:20 PM   Specimen: Nasopharyngeal  Result Value Ref Range Status   MRSA by PCR NEGATIVE NEGATIVE Final    Comment:        The GeneXpert MRSA Assay (FDA approved for NASAL specimens only), is one component of a comprehensive MRSA colonization surveillance program. It is not intended to diagnose MRSA infection nor to guide or monitor treatment for MRSA infections. Performed at [facility], [place] [address] [place], [place] [address]      Labs: BNP (last 3 results) Recent Labs    07/16/20 1739  BNP 54.6   Basic Metabolic Panel: Recent Labs  Lab 07/16/20 1739 07/19/20 0223  NA 137 135  K 4.3 4.2  CL 103 102  CO2 24 25  GLUCOSE 110* 107*  BUN 21 17  CREATININE 0.97 1.10  CALCIUM 9.0 8.8*   Liver Function Tests: Recent Labs  Lab 07/16/20 1739  AST 25  ALT 34  ALKPHOS 71  BILITOT 1.0  PROT 6.9  ALBUMIN 4.0   No results for input(s): LIPASE, AMYLASE in the last 168 hours. No results for input(s): AMMONIA in the last 168 hours. CBC: Recent Labs  Lab 07/16/20 1739 07/17/20 0352 07/18/20 0147 07/19/20 0223  WBC 9.9 9.1 8.5 8.2  NEUTROABS 7.4  --   --  5.8  HGB 15.3 14.5 14.5 13.8  HCT 44.8 42.3 41.3 39.9  MCV 85.5 85.3 84.8 84.2  PLT 259 241 265 261   Cardiac Enzymes: No results for input(s): CKTOTAL, CKMB, CKMBINDEX, TROPONINI in the last 168 hours. BNP: Invalid input(s): POCBNP CBG: Recent Labs  Lab 07/16/20 2343  GLUCAP 93   D-Dimer No results for input(s): DDIMER in the last 72 hours. Hgb A1c No results for input(s): HGBA1C in the last 72 hours. Lipid Profile No results for input(s): CHOL, HDL, LDLCALC, TRIG, CHOLHDL, LDLDIRECT in the last 72 hours. Thyroid function studies No results for input(s): TSH, T4TOTAL, T3FREE, THYROIDAB in the last 72 hours.  Invalid input(s): FREET3 Anemia work up No results for input(s): VITAMINB12, FOLATE,  FERRITIN, TIBC, IRON, RETICCTPCT in the last 72 hours. Urinalysis    Component Value Date/Time   COLORURINE YELLOW 09/14/2019 2143   APPEARANCEUR CLEAR 09/14/2019 2143   LABSPEC <1.005 (L) 09/14/2019 2143   PHURINE 6.0 09/14/2019 2143   GLUCOSEU NEGATIVE 09/14/2019 2143   HGBUR NEGATIVE 09/14/2019 2143   HGBUR trace-intact 02/18/2010 5681  BILIRUBINUR NEGATIVE 09/14/2019 2143   BILIRUBINUR Negative 04/20/2018 [place] 09/14/2019 2143   PROTEINUR NEGATIVE 09/14/2019 2143   UROBILINOGEN 0.2 04/20/2018 1530   UROBILINOGEN 1.0 11/17/2014 1750   NITRITE NEGATIVE 09/14/2019 2143   LEUKOCYTESUR NEGATIVE 09/14/2019 2143   Sepsis Labs Invalid input(s): PROCALCITONIN,  WBC,  LACTICIDVEN Microbiology Recent Results (from the past 240 hour(s))  Resp Panel by RT-PCR (Flu A&B, Covid) Nasopharyngeal Swab     Status: None   Collection Time: 07/16/20  5:48 PM   Specimen: Nasopharyngeal Swab; Nasopharyngeal(NP) swabs in vial transport medium  Result Value Ref Range Status   SARS Coronavirus 2 by RT PCR NEGATIVE NEGATIVE Final    Comment: (NOTE) SARS-CoV-2 target nucleic acids are NOT DETECTED.  The SARS-CoV-2 RNA is generally detectable in upper respiratory specimens during the acute phase of infection. The lowest concentration of SARS-CoV-2 viral copies this assay can detect is 138 copies/mL. A negative result does not preclude SARS-Cov-2 infection and should not be used as the sole basis for treatment or other patient management decisions. A negative result may occur with  improper specimen collection/handling, submission of specimen other than nasopharyngeal swab, presence of viral mutation(s) within the areas targeted by this assay, and inadequate number of viral copies(<138 copies/mL). A negative result must be combined with clinical observations, patient history, and epidemiological information. The expected result is Negative.  Fact Sheet for Patients:   EntrepreneurPulse.com.au  Fact Sheet for Healthcare Providers:  IncredibleEmployment.be  This test is no t yet approved or cleared by the [place] FDA and  has been authorized for detection and/or diagnosis of SARS-CoV-2 by FDA under an Emergency Use Authorization (EUA). This EUA will remain  in effect (meaning this test can be used) for the duration of the COVID-19 declaration under Section 564(b)(1) of the Act, 21 U.S.C.section 360bbb-3(b)(1), unless the authorization is terminated  or revoked sooner.       Influenza A by PCR NEGATIVE NEGATIVE Final   Influenza B by PCR NEGATIVE NEGATIVE Final    Comment: (NOTE) The Xpert Xpress SARS-CoV-2/FLU/RSV plus assay is intended as an aid in the diagnosis of influenza from Nasopharyngeal swab specimens and should not be used as a sole basis for treatment. Nasal washings and aspirates are unacceptable for Xpert Xpress SARS-CoV-2/FLU/RSV testing.  Fact Sheet for Patients: EntrepreneurPulse.com.au  Fact Sheet for Healthcare Providers: IncredibleEmployment.be  This test is not yet approved or cleared by the [place] FDA and has been authorized for detection and/or diagnosis of SARS-CoV-2 by FDA under an Emergency Use Authorization (EUA). This EUA will remain in effect (meaning this test can be used) for the duration of the COVID-19 declaration under Section 564(b)(1) of the Act, 21 U.S.C. section 360bbb-3(b)(1), unless the authorization is terminated or revoked.  Performed at [facility], [address] [place], [place] [address]   MRSA PCR Screening     Status: None   Collection Time: 07/16/20 10:20 PM   Specimen: Nasopharyngeal  Result Value Ref Range Status   MRSA by PCR NEGATIVE NEGATIVE Final    Comment:        The GeneXpert MRSA Assay (FDA approved for NASAL specimens only), is one component of a comprehensive MRSA  colonization surveillance program. It is not intended to diagnose MRSA infection nor to guide or monitor treatment for MRSA infections. Performed at [facility], [place] [address], [place], [place] [address]     Please note: You were cared for by  a hospitalist during your hospital stay. Once you are discharged, your primary care physician will handle any further medical issues. Please note that NO REFILLS for any discharge medications will be authorized once you are discharged, as it is imperative that you return to your primary care physician (or establish a relationship with a primary care physician if you do not have one) for your post hospital discharge needs so that they can reassess your need for medications and monitor your lab values.    Time coordinating discharge: 40 minutes  SIGNED:   Shelly Coss, MD  [facility] 07/19/2020, 10:27 AM Pager [phone]  If 7PM-7AM, please contact night-coverage [email or web address] Password [email or web address]

## 2020-07-19 NOTE — Progress Notes (Signed)
Patient brought to 4E from [place]. VSS stable. CHG bath  completed. Telemetry box applied, CCMD notified. Patient oriented to room and staff. Call bell in reach. Patient stated he has 0/10 pain scale.  Daymon Larsen, RN

## 2020-07-19 NOTE — Discharge Instructions (Signed)
Information on my medicine - ELIQUIS (apixaban) Why was Eliquis prescribed for you? Eliquis was prescribed to treat blood clots that may have been found in the veins of your legs (deep vein thrombosis) or in your lungs (pulmonary embolism) and to reduce the risk of them occurring again.  What do You need to know about Eliquis ? The starting dose is 10 mg (two 5 mg tablets) taken TWICE daily for the FIRST SEVEN (7) DAYS, then on 2/24  the dose is reduced to ONE 5 mg tablet taken TWICE daily.  Eliquis may be taken with or without food.   Try to take the dose about the same time in the morning and in the evening. If you have difficulty swallowing the tablet whole please discuss with your pharmacist how to take the medication safely.  Take Eliquis exactly as prescribed and DO NOT stop taking Eliquis without talking to the doctor who prescribed the medication.  Stopping may increase your risk of developing a new blood clot.  Refill your prescription before you run out.  After discharge, you should have regular check-up appointments with your healthcare provider that is prescribing your Eliquis.    What do you do if you miss a dose? If a dose of ELIQUIS is not taken at the scheduled time, take it as soon as possible on the same day and twice-daily administration should be resumed. The dose should not be doubled to make up for a missed dose.  Important Safety Information A possible side effect of Eliquis is bleeding. You should call your healthcare provider right away if you experience any of the following: ? Bleeding from an injury or your nose that does not stop. ? Unusual colored urine (red or dark brown) or unusual colored stools (red or black). ? Unusual bruising for unknown reasons. ? A serious fall or if you hit your head (even if there is no bleeding).  Some medicines may interact with Eliquis and might increase your risk of bleeding or clotting while on Eliquis. To help avoid  this, consult your healthcare provider or pharmacist prior to using any new prescription or non-prescription medications, including herbals, vitamins, non-steroidal anti-inflammatory drugs (NSAIDs) and supplements.  This website has more information on Eliquis (apixaban): http://www.eliquis.com/eliquis/home

## 2020-07-19 NOTE — TOC Benefit Eligibility Note (Signed)
Transition of Care [facility]) Benefit Eligibility Note    Patient Details  Name: Nicholas Owen MRN: [id number] Date of Birth: [birth year]   Medication/Dose: Arne Cleveland  5 MG BID  Covered?: Yes  Tier: 3 Drug  Prescription Coverage [facility]  [facility]  Spoke with Person/Company/Phone Number:: CIARRA  @  [employer] PP #  [phone]  Co-Pay: $ 45.00  Prior Approval: No  Deductible:  (NO DEDUCTIBLE WITH PLAN   and   OUT-OF-POCKET :UNMET)  Additional Notes: APIXABAN: NON-FORMULARY    Memory Argue Phone Number: 07/19/2020, 12:45 PM

## 2020-07-20 ENCOUNTER — Telehealth: Payer: Self-pay | Admitting: Internal Medicine

## 2020-07-20 ENCOUNTER — Other Ambulatory Visit: Payer: Self-pay | Admitting: Internal Medicine

## 2020-07-20 DIAGNOSIS — R0902 Hypoxemia: Secondary | ICD-10-CM

## 2020-07-20 LAB — CBC
HCT: 40.9 % (ref 39.0–52.0)
Hemoglobin: 13.8 g/dL (ref 13.0–17.0)
MCH: 28.8 pg (ref 26.0–34.0)
MCHC: 33.7 g/dL (ref 30.0–36.0)
MCV: 85.2 fL (ref 80.0–100.0)
Platelets: 280 10*3/uL (ref 150–400)
RBC: 4.8 MIL/uL (ref 4.22–5.81)
RDW: 13.1 % (ref 11.5–15.5)
WBC: 8.3 10*3/uL (ref 4.0–10.5)
nRBC: 0 % (ref 0.0–0.2)

## 2020-07-20 MED ORDER — APIXABAN 5 MG PO TABS: 5.0000 mg | ORAL_TABLET | Freq: Two times a day (BID) | ORAL | 3 refills | Status: DC

## 2020-07-20 MED ORDER — APIXABAN 5 MG PO TABS: 10.0000 mg | ORAL_TABLET | Freq: Two times a day (BID) | ORAL | 0 refills | Status: DC

## 2020-07-20 MED FILL — ELIQUIS 5 MG TABLET: 5 | 30 days supply | Qty: 74 | Fill #0

## 2020-07-20 NOTE — Care Management (Signed)
Ordered home oxygen with [name] with [facility]. They will bring a portable tank to room prior to discharge.     Magdalen Spatz RN

## 2020-07-20 NOTE — Telephone Encounter (Signed)
Staff message sent by Dr. Tamala Julian: 3 mo f/u with either MH or JD  Candee Furbish, MD  P Lbpu Triage Pool For PE f/u    Dr. Tamala Julian, just to check especially with pt just being in the hospital that you are wanting pt to be seen in office by either Dr. Silas Flood or Dr. Erin Fulling 3 months from now. Please advise.

## 2020-07-20 NOTE — Telephone Encounter (Signed)
Yep he being discharged today thanks

## 2020-07-20 NOTE — Progress Notes (Signed)
D/C instructions given to patient. Medications reviewed. IV removed, clean and intact. Wife to escort pt home.  Clyde Canterbury, RN

## 2020-07-20 NOTE — Care Management Important Message (Signed)
Important Message  Patient Details  Name: Nicholas Owen MRN: [id number] Date of Birth: 06/06/1945   Medicare Important Message Given:  Yes     Shelda Altes 07/20/2020, 9:06 AM

## 2020-07-20 NOTE — Telephone Encounter (Signed)
Due to schedules not being out for May yet, recall has been placed for pt to have new pt appt in 3 months with either Dr. Silas Flood or Dr. Erin Fulling. Nothing further needed.

## 2020-07-20 NOTE — Progress Notes (Addendum)
  Please briefly explain why patient needs home oxygen:SATURATION QUALIFICATIONS: (This note is used to comply with regulatory documentation for home oxygen)  Patient Saturations on Room Air at Rest = 92%  Patient Saturations on Room Air while Ambulating = 88%  Patient Saturations on 0 Liters of oxygen while Ambulating = 90%  After walking patient oxygen increased to 93% with a short rest break.

## 2020-07-20 NOTE — Evaluation (Signed)
Physical Therapy Evaluation Patient Details Name: Nicholas Owen MRN: [id number] DOB: [birth year] Today's Date: 07/20/2020   History of Present Illness  Pt is a 75 y/o male admitted secondary to increased SOB. Found to have saddle PE. PMH includes basal cell cancer, DVT, and prostate cancer.  Clinical Impression  Pt admitted secondary to problem above with deficits below. Pt requiring min guard A for mobility tasks and limited secondary to fatigue. Only mild SOB noted. SpO2 not getting a good read during ambulation, but lowest saturation noted was 88% on RA. Sats quickly returned to >90% with standing or seated rest. Notified RN. Educated about walking program to perform at home. Reports wife can assist as needed. Will continue to follow acutely.     Follow Up Recommendations No PT follow up    Equipment Recommendations  None recommended by PT    Recommendations for Other Services       Precautions / Restrictions Precautions Precautions: Fall Restrictions Weight Bearing Restrictions: No      Mobility  Bed Mobility               General bed mobility comments: In chair upon entry    Transfers Overall transfer level: Needs assistance Equipment used: None Transfers: Sit to/from Stand Sit to Stand: Min guard         General transfer comment: Min guard for safety.  Ambulation/Gait Ambulation/Gait assistance: Min guard Gait Distance (Feet): 120 Feet Assistive device: None Gait Pattern/deviations: Step-through pattern;Decreased stride length;Wide base of support Gait velocity: Decreased   General Gait Details: Mildly unsteady gait and pt reporting mild SOB. Limited distance secondary to fatigue. oxygen probe not getting good read, but lowest sat noted was 88% on RA. Pt quickly recovered to 93-94% on RA with standing or seated rest so unsure of accuracy of read. Notified Warden/ranger [place] (Stroke Patients Only)        Balance Overall balance assessment: Mild deficits observed, not formally tested                                           Pertinent Vitals/Pain Pain Assessment: Faces Faces Pain Scale: Hurts little more Pain Location: bilateral knees Pain Descriptors / Indicators: Aching Pain Intervention(s): Limited activity within patient's tolerance;Monitored during session;Repositioned    Home Living Family/patient expects to be discharged to:: Private residence Living Arrangements: Spouse/significant other Available Help at Discharge: Family;Available 24 hours/day Type of Home: House Home Access: Level entry     Home Layout: One level Home Equipment: Walker - 4 wheels;Cane - single point;Shower seat      Prior Function Level of Independence: Independent         Comments: Reports he is independent but lives a very sedentary life     Hand Dominance        Extremity/Trunk Assessment   Upper Extremity Assessment Upper Extremity Assessment: Overall WFL for tasks assessed    Lower Extremity Assessment Lower Extremity Assessment: Generalized weakness    Cervical / Trunk Assessment Cervical / Trunk Assessment: Normal  Communication   Communication: No difficulties  Cognition Arousal/Alertness: Awake/alert Behavior During Therapy: WFL for tasks assessed/performed Overall Cognitive Status: Within Functional Limits for tasks assessed  General Comments General comments (skin integrity, edema, etc.): Educated about generalized walking program to perform    Exercises     Assessment/Plan    PT Assessment Patient needs continued PT services  PT Problem List Cardiopulmonary status limiting activity;Decreased strength;Decreased activity tolerance;Decreased balance;Decreased mobility       PT Treatment Interventions Gait training;Functional mobility training;Therapeutic activities;Therapeutic  exercise;Balance training;Patient/family education    PT Goals (Current goals can be found in the Care Plan section)  Acute Rehab PT Goals Patient Stated Goal: to be able to breathe better PT Goal Formulation: With patient Time For Goal Achievement: 08/03/20 Potential to Achieve Goals: Good    Frequency Min 3X/week   Barriers to discharge        Co-evaluation               AM-PAC PT "6 Clicks" Mobility  Outcome Measure Help needed turning from your back to your side while in a flat bed without using bedrails?: None Help needed moving from lying on your back to sitting on the side of a flat bed without using bedrails?: None Help needed moving to and from a bed to a chair (including a wheelchair)?: A Little Help needed standing up from a chair using your arms (e.g., wheelchair or bedside chair)?: A Little Help needed to walk in hospital room?: A Little Help needed climbing 3-5 steps with a railing? : A Little 6 Click Score: 20    End of Session   Activity Tolerance: Patient limited by fatigue Patient left: in chair;with call bell/phone within reach Nurse Communication: Mobility status PT Visit Diagnosis: Unsteadiness on feet (R26.81);Muscle weakness (generalized) (M62.81);Difficulty in walking, not elsewhere classified (R26.2)    Time: 6759-1638 PT Time Calculation (min) (ACUTE ONLY): 16 min   Charges:   PT Evaluation $PT Eval Moderate Complexity: 1 Mod          Reuel Derby, PT, DPT  Acute Rehabilitation Services  Pager: [phone] Office: [phone]   Rudean Hitt 07/20/2020, 9:27 AM

## 2020-07-20 NOTE — Progress Notes (Signed)
Patient seen and examined. He is stable. He denies chest pain or worsening dyspnea. Oxygen sat drop to 88 on ambulation. He has been tolerating eliquis. Plan to arrange home oxygen. He will need follow up with Pulmonologist. He need repeat ECHO in 3 months. He is stable for discharge. Please see discharge summary done 2/17 by Dr Shelly Coss.  Lungs CTA, [facility]; S 1, S 2 RRR

## 2020-07-23 ENCOUNTER — Telehealth: Payer: Self-pay

## 2020-07-23 DIAGNOSIS — I2699 Other pulmonary embolism without acute cor pulmonale: Secondary | ICD-10-CM | POA: Diagnosis not present

## 2020-07-23 NOTE — Patient Instructions (Addendum)
No changes today   labwork next visit likely  Let us know if any changes in how you are feeling  Let me know if you think things over and want to do cardiology visit now or wait and chat at next visit or with pulmonology first  Recommended follow up: keep next scheduled visit

## 2020-07-23 NOTE — Progress Notes (Signed)
Phone [phone]   Subjective:  Nicholas Owen is a 75 y.o. year old very pleasant male patient who presents for transitional care management and hospital follow up for pulmonary embolism. Patient was hospitalized from 07/16/2020 to 07/19/2020. A TCM phone call was completed on 07/23/2020. Medical complexity moderate  75 year old male with history of DVT/PE who has been off Xarelto approximately 6 years who has been more sedentary in the last year and noted acute shortness of breath while walking to his car before presenting to the emergency room found to have elevated troponin and CT scan showing saddle pulmonary embolism with right heart strain consistent with at least submassive pulmonary embolism.  Patient was initially started on heparin drip due to severity of pulmonary embolism and later transitioned to Eliquis.  Hospital discharge was reviewed and summarized as noted here  #Submassive PE with right ventricular strain and patient with lupus anticoagulant disorder previously on aspirin 325 mg -as above patient initially on heparin drip but transition to Eliquis.Plan at this point is for lifelong anticoagulation with history of prior DVT/PE.  Echocardiogram showed ejection fraction of 55 to 60% with grade 1 diastolic dysfunction, right ventricular pressure overload, severely hypokinetic right ventricle with moderate reduction in the right ventricle system as well as severely enlarged right ventricular size. -Thought provoked as patient has had knee problems and has been rather sedentary spending most of his time sitting on the recliner up to 16 hours a day per discharge summary.  It was recommended for him to work on exercise and ambulation -found to have indeterminate age DVT right femoral vein, right popliteal vein, right posterior tibial veins, and right peroneal vein -Patient did have acute respiratory failure with hypoxia secondary to pulmonary embolism-he is on 2L at present maintaining  levels with activity.   -Patient with elevated troponin secondary to pulmonary embolism-no chest pain-no further work-up such as catheterization was required -pulmonology visit planned in 3 months -possible cardiology visit- they will think this over- plus may ask pulmonology  -doing some short walks at the house-breathing is certainly improving-has to use oxygen with these walls  -We discussed elevated troponin level was related to right heart strain from PE and not suggestive of coronary artery disease Has had occasional cough post hospital- worse when laying down- may only be for 5 minutes and then gets better. Wife states deep. Taking omeprazole still. Consider CXR if fails to improve or worsens -Handicap placard written when factoring in oxygen as well as worsening away in the knees  #Hyperlipidemia-patient was maintained on rosuvastatin 10 mg while hospitalized Lab Results  Component Value Date   CHOL 167 08/29/2019   HDL 38.30 (L) 08/29/2019   LDLCALC 110 (H) 08/29/2019   LDLDIRECT 78 02/29/2020   TRIG 95.0 08/29/2019   CHOLHDL 4 08/29/2019    # GERD S:Medication: Omeprazole 20 mg B12 levels related to PPI use: Has been in the 200s in the past March 2021   # OA knees- has been having significant issues- had a bad flare up after 5 mile bike ride at gym in recent months. We are going to do handicap placard. Had to have injections with [facility]  # Hyperglycemia/insulin resistance/prediabetes Noted on prior labs-too soon for repeat Lab Results  Component Value Date   HGBA1C 6.0 (H) 02/29/2020   HGBA1C 6.0 08/29/2019   HGBA1C 6.1 11/04/2016   Labs were reviewed prior to discharge-CBC normal with hemoglobin of 13.8, WBC normal at 8.3, platelets normal at 280.  BMP normal  with creatinine at 1.1.  Calcium slightly low-may have been dilutional -patient agreeable to recheck at follow-up  I personally reviewed imaging from chest x-ray report on 07/16/2020.  Airway normal, no bony  abnormalities, cardiac silhouette appears slightly enlarged on my view-not corroborated by radiology findings, diaphragm appears partially elevated on the right, calcified potential granulomas at right lung base-compared to prior imaging stable.  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   See problem oriented charting as well  Past Medical History-  Patient Active Problem List   Diagnosis Date Noted  . Lupus anticoagulant disorder ([place]) 04/30/2015    Priority: High  . History of DVT of lower extremity 08/31/2012    Priority: High  . Chronic pulmonary embolism ([place]) 04/13/2012    Priority: High  . History of prostate cancer 05/14/2011    Priority: High  . Diverticulitis 05/19/2018    Priority: Medium  . Urinary incontinence 09/20/2014    Priority: Medium  . Hyperglycemia 05/12/2012    Priority: Medium  . GERD 10/04/2007    Priority: Medium  . Hyperlipidemia 03/08/2007    Priority: Medium  . Diverticulosis 03/22/2014    Priority: Low  . Erectile dysfunction 03/22/2014    Priority: Low  . Personal history of colonic polyps 08/31/2012    Priority: Low  . Basal cell carcinoma     Priority: Low  . Obesity 10/08/2011    Priority: Low  . 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 Kidney stone 06/02/2010    Priority: Low  . Allergic rhinitis 10/04/2007    Priority: Low  . Acute appendicitis 09/15/2019    Medications- reviewed and updated  A medical reconciliation was performed comparing current medicines to hospital discharge medications. Current Outpatient Medications  Medication Sig Dispense Refill  . apixaban (ELIQUIS) 5 MG TABS tablet Take 2 tablets (10 mg total) by mouth 2 (two) times daily for 6 days. 24 tablet 0  . Ascorbic Acid (VITAMIN C) 1000 MG tablet Take 1,000 mg by mouth daily.    . cholecalciferol (VITAMIN D3) 25 MCG (1000  UNIT) tablet Take 1,000 Units by mouth daily.    Marland Kitchen co-enzyme Q-10 50 MG capsule Take 200 mg by mouth daily.    . Multiple Vitamins-Minerals (MULTIVITAMIN WITH MINERALS) tablet Take 1 tablet by mouth daily.    Marland Kitchen omeprazole (PRILOSEC) 20 MG capsule TAKE 1 CAPSULE EVERY DAY (Patient taking differently: Take 20 mg by mouth daily.) 90 capsule 1  . rosuvastatin (CRESTOR) 10 MG tablet Take 1 tablet (10 mg total) by mouth daily. 90 tablet 3  . [START ON 07/26/2020] apixaban (ELIQUIS) 5 MG TABS tablet Take 1 tablet (5 mg total) by mouth 2 (two) times daily. 180 tablet 3   No current facility-administered medications for this visit.   Objective  Objective:  BP 126/88   Pulse 77   Temp 99 F (37.2 C) (Temporal)   Ht 6' (1.829 m)   Wt 252 lb 6.4 oz (114.5 kg)   SpO2 98%   BMI 34.23 kg/m  Gen: NAD, resting comfortably with 2 L of oxygen CV: RRR no murmurs rubs or gallops Lungs: CTAB no crackles, wheeze, rhonchi Abdomen: soft/nontender/nondistended/normal bowel sounds. No rebound or guarding.  Ext: no edema Skin: warm, dry    Assessment and Plan:   #Pulmonary embolism likely related to lupus anticoagulant disorder along with sedentary activity-now requiring lifelong therapy after  second PE #Right heart strain due to submassive saddle pulmonary embolism #Respiratory failure -Patient will complete 7 days of Eliquis 10 mg twice daily and transition to 5 mg twice daily for lifelong therapy.  Stressed importance of long-term compliance-90-day supply sent to mail order to assist with compliance -At present patient still requiring oxygen-with activity he is now 92 to 94% with 2 L-he has a physical within 6 weeks and we will see if he can be weaned off at that time [facility] recommended 25-month follow-up with pulmonary-reinforced importance of calling to schedule this -With right heart strain patient/wife asked about possible cardiology referral-I offered to refer at this time they would like to see how  he is doing at follow-up and also discussed with pulmonary-if they change her mind they can certainly return prior to next visit with me -Slight cough in the evening since being home from hospital-he agrees to monitor this and if worsens we could consider chest x-ray-wife is worried about pneumonia-lungs clear on exam today.  With being in the evenings and with laying down could be reflux related but he is maintaining omeprazole 20 mg and I do not strongly suspect GERD as a primary driver  #Hyperlipidemia-lipids well controlled on rosuvastatin 10 mg on last check  #GERD-appears reasonably well controlled on current medication-continue omeprazole 20 mg.  Consider B12 check at follow-up-low normal last year  #Prediabetes/hyperglycemia-A1c previously elevated-we will need to repeat this at physical  Recommended follow up: We will keep April 1 physical-we discussed would need much closer follow-up if new or worsening symptoms.  Once again patient was encouraged to call pulmonology for follow-up Future Appointments  Date Time Provider [place]  08/31/2020 10:40 AM Marin Olp, MD [facility]    Lab/Order associations:   ICD-10-CM   1. Lupus anticoagulant disorder (HCC)  D68.62   2. 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 Hyperlipidemia, unspecified hyperlipidemia type  E78.5   4. Hyperglycemia  R73.9   5. Gastroesophageal reflux disease, unspecified whether esophagitis present  K21.9     Meds ordered this encounter  Medications  . apixaban (ELIQUIS) 5 MG TABS tablet    Sig: Take 1 tablet (5 mg total) by mouth 2 (two) times daily.    Dispense:  180 tablet    Refill:  3   Time Spent: 60 minutes of total time (8:59 AM-9:44 AM, 2:00 PM- 2:15 PM) was spent on the date of the encounter performing the following actions: chart review prior to seeing the patient, obtaining history, performing a medically necessary exam, counseling on  the treatment plan, placing orders, and documenting in our EHR.    Return precautions advised.  [name], MD

## 2020-07-23 NOTE — Telephone Encounter (Signed)
Im perfectly fine with wife coming. Team please call   Thanks for TCM call

## 2020-07-23 NOTE — Telephone Encounter (Cosign Needed)
Transition Care Management Follow-up Telephone Call  Date of discharge and from where: [facility] 07/20/20  How have you been since you were released from the hospital? ok  Any questions or concerns? No  Items Reviewed:  Did the pt receive and understand the discharge instructions provided? Yes   Medications obtained and verified? Yes   Other? No   Any new allergies since your discharge? Yes   Dietary orders reviewed? Yes  Do you have support at home? Yes   Home Care and Equipment/Supplies: Were home health services ordered? not applicable If so, what is the name of the agency?   Has the agency set up a time to come to the patient's home? not applicable Were any new equipment or medical supplies ordered?  No What is the name of the medical supply agency?  Were you able to get the supplies/equipment? not applicable Do you have any questions related to the use of the equipment or supplies? No  Functional Questionnaire: (I = Independent and D = Dependent) ADLs: I  Bathing/Dressing- I  Meal Prep- I  Eating- I  Maintaining continence- I  Transferring/Ambulation- I  Managing Meds- I  Follow up appointments reviewed:   PCP Hospital f/u appt confirmed? Yes  Scheduled to see Dr Yong Channel  on 07/25/20 @ 8:40 pt is requesting his wife come in with him to get complete understanding of visit   Are transportation arrangements needed? No   If their condition worsens, is the pt aware to call PCP or go to the Emergency Dept.? Yes  Was the patient provided with contact information for the PCP's office or ED? Yes  Was to pt encouraged to call back with questions or concerns? Yes

## 2020-07-24 ENCOUNTER — Ambulatory Visit: Payer: Medicare HMO | Admitting: *Deleted

## 2020-07-24 NOTE — Telephone Encounter (Signed)
Called and made pt aware ok to bring wife tomorrow.

## 2020-07-24 NOTE — Chronic Care Management (AMB) (Signed)
  Care Management     RN Outreach Note  07/24/2020 Name: Nicholas Owen MRN: [id number] DOB: 03/31/1946  Nicholas Owen is Owen 75 y.o. year old male who is Owen primary care patient of Nicholas Owen, Nicholas Owen, Nicholas Owen reached out to Nicholas Owen today by phone to discuss Nicholas Owen.   EMMI Owen Follow Up  Red on EMMI Owen General Discharge EMMI Day:  #1 Date: 07/22/20 Red Owen Reason:  Unfilled prescriptions? Yes; Able to fill today/tomorrow? No   Outreach Attempt:  Successful telephone outreach to patient for follow up on Nicholas Owen.  HIPAA verified with patient.  Patient answered discharge questions: Unfilled prescriptions? Yes; Able to fill today/tomorrow? No.  Patient stating there was miscommunication.  He actually got medications from pharmacy prior to leaving the hospital.  States he has been able to take medications as prescribed without difficulities.  Has follow up appointment with primary care scheduled tomorrow and states he will ask for prescription to be sent to [facility] order.  Denies any shortness of breath at this time and reports use of oxygen as needed.  Clinical Interventions:  Explained to the patient the nature of the call  Patient condition was assessed  Advised patient to contact primary care provider office for any questions or concerns.  Reviewed scheduled/upcoming provider appointments including:  Nicholas Owen, primary care on 07/25/20 at [place] if ELESTER APODACA receives an automated call again and the response to any of the questions are abnormal Nicholas Owen may get another call. Patient states that the call was appreciated.   Plan:  No further follow up needed at this time as patient has no questions or concerns.  Has medications prescribed and follow up appointment with primary care provider is scheduled.  Nicholas Azure RN, MSN RN Care Management Coordinator  [facility] [phone] [email or web address]

## 2020-07-25 ENCOUNTER — Other Ambulatory Visit: Payer: Self-pay

## 2020-07-25 ENCOUNTER — Encounter: Payer: Self-pay | Admitting: Family Medicine

## 2020-07-25 ENCOUNTER — Ambulatory Visit (INDEPENDENT_AMBULATORY_CARE_PROVIDER_SITE_OTHER): Payer: Medicare HMO | Admitting: Family Medicine

## 2020-07-25 VITALS — BP 126/88 | HR 77 | Temp 99.0°F | Ht 72.0 in | Wt 252.4 lb

## 2020-07-25 DIAGNOSIS — I2782 Chronic pulmonary embolism: Secondary | ICD-10-CM | POA: Diagnosis not present

## 2020-07-25 DIAGNOSIS — E785 Hyperlipidemia, unspecified: Secondary | ICD-10-CM

## 2020-07-25 DIAGNOSIS — R739 Hyperglycemia, unspecified: Secondary | ICD-10-CM

## 2020-07-25 DIAGNOSIS — J9611 Chronic respiratory failure with hypoxia: Secondary | ICD-10-CM | POA: Diagnosis not present

## 2020-07-25 DIAGNOSIS — D6862 Lupus anticoagulant syndrome: Secondary | ICD-10-CM

## 2020-07-25 DIAGNOSIS — K219 Gastro-esophageal reflux disease without esophagitis: Secondary | ICD-10-CM | POA: Diagnosis not present

## 2020-07-25 DIAGNOSIS — I2602 Saddle embolus of pulmonary artery with acute cor pulmonale: Secondary | ICD-10-CM

## 2020-07-25 MED ORDER — APIXABAN 5 MG PO TABS: 5.0000 mg | ORAL_TABLET | Freq: Two times a day (BID) | ORAL | 3 refills | Status: DC

## 2020-07-25 NOTE — Assessment & Plan Note (Signed)
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  Stressed importance of long-term compliance-90-day supply sent to mail order to assist with compliance -At present patient still requiring oxygen-with activity he is now 92 to 94% with 2 L-he has a physical within 6 weeks and we will see if he can be weaned off at that time [facility] recommended 4-month follow-up with pulmonary-reinforced importance of calling to schedule this -With right heart strain patient/wife asked about possible cardiology referral-I offered to refer at this time they would like to see how he is doing at follow-up and also discussed with pulmonary-if they change her mind they can certainly return prior to next visit with me

## 2020-08-01 ENCOUNTER — Other Ambulatory Visit: Payer: Self-pay | Admitting: Family Medicine

## 2020-08-04 ENCOUNTER — Encounter: Payer: Self-pay | Admitting: Family Medicine

## 2020-08-07 ENCOUNTER — Other Ambulatory Visit: Payer: Self-pay | Admitting: Family Medicine

## 2020-08-20 DIAGNOSIS — I2699 Other pulmonary embolism without acute cor pulmonale: Secondary | ICD-10-CM | POA: Diagnosis not present

## 2020-08-27 ENCOUNTER — Telehealth (INDEPENDENT_AMBULATORY_CARE_PROVIDER_SITE_OTHER): Payer: Medicare HMO | Admitting: Physician Assistant

## 2020-08-27 VITALS — Temp 98.6°F

## 2020-08-27 DIAGNOSIS — R059 Cough, unspecified: Secondary | ICD-10-CM | POA: Diagnosis not present

## 2020-08-27 MED ORDER — AMOXICILLIN-POT CLAVULANATE 875-125 MG PO TABS: 1.0000 | ORAL_TABLET | Freq: Two times a day (BID) | ORAL | 0 refills | Status: AC

## 2020-08-27 NOTE — Patient Instructions (Signed)
Someone will call to schedule COVID PCR test for tomorrow. Please go to the ED if any worsening symptoms or SpO2 drops below 90%.  Take the Augmentin as directed. Call with updates by Thursday this week.

## 2020-08-27 NOTE — Progress Notes (Signed)
Virtual Visit via Video Note  I connected with Nicholas Owen on 08/27/20 at  2:30 PM EDT by a video enabled telemedicine application and verified that I am speaking with the correct person using two identifiers.  Location: Patient: home  Provider: Therapist, music at [employer]   I discussed the limitations of evaluation and management by telemedicine and the availability of in person appointments. The patient expressed understanding and agreed to proceed.   Only the patient and myself were present for today's video call.   History of Present Illness: Chief complaint: Deep, hard, paroxysmal cough Symptom onset: 4 days ago Pertinent positives: Cough, fatigue, slight ST, occasional SOB (PE on 07/16/20 - feels better since then)  Pertinent negatives: Fever, chills, nasal congestion, SOB, hx of asthma, sneezing, CP Treatments tried: Taking Robitussin and getting some phlegm up now.  Vaccine status: None Sick exposure: None  Home test about 3 days ago was negative.    Observations/Objective: Very harsh paroxysmal cough while present SpO2 - 91% on room air   Gen: Awake, alert, no acute distress Resp: Breathing is even and non-labored Psych: calm/pleasant demeanor Neuro: Alert and Oriented x 3, + facial symmetry, speech is clear.   Assessment and Plan: 1. Cough Given the limitations of virtual health visits, from the sound of his cough and presentation today, I would be concerned for possible underlying bacterial etiology, ? Early pneumonia. Will start on Augmentin as directed. Mucinex plain OTC. Also advised COVID-19 PCR testing at our office tomorrow. He will monitor SpO2 at home. If lower than 90%, worsening symptoms, he will go to ED. Pt to call with updates.   Follow Up Instructions:    I discussed the assessment and treatment plan with the patient. The patient was provided an opportunity to ask questions and all were answered. The patient agreed with the plan  and demonstrated an understanding of the instructions.   The patient was advised to call back or seek an in-person evaluation if the symptoms worsen or if the condition fails to improve as anticipated.  Brando Taves M Marceline Napierala, PA-C

## 2020-08-28 ENCOUNTER — Ambulatory Visit: Payer: Medicare HMO | Attending: Internal Medicine

## 2020-08-28 DIAGNOSIS — Z20822 Contact with and (suspected) exposure to covid-19: Secondary | ICD-10-CM | POA: Diagnosis not present

## 2020-08-29 LAB — SARS-COV-2, NAA 2 DAY TAT

## 2020-08-29 LAB — NOVEL CORONAVIRUS, NAA: SARS-CoV-2, NAA: NOT DETECTED

## 2020-08-30 NOTE — Patient Instructions (Addendum)
Please stop by lab before you go If you have mychart- we will send your results within 3 business days of [place] receiving them.  If you do not have mychart- we will call you about results within 5 business days of [place] receiving them.  *please also note that you will see labs on mychart as soon as they post. I will later go in and write notes on them- will say "notes from Dr. Yong Channel"  Could consider prevnar 44 and shingrix at the pharmacy once you recover  Recommended follow up: 3-6 months or sooner if needed

## 2020-08-30 NOTE — Progress Notes (Signed)
Phone: [phone]   Subjective:  Patient presents today for their annual physical. Chief complaint-noted.   See problem oriented charting- ROS- full  review of systems was completed and negative  except for: some fatigue with recent illness, some sore throat with recent illness as well as cough and wheezing  The following were reviewed and entered/updated in epic: Past Medical History:  Diagnosis Date  . Allergy   . Asbestos exposure   . Basal cell carcinoma    skin of left ear  . BPH (benign prostatic hyperplasia)   . Cancer ([place])    basal cell CA (skin)  . Colitis 01/2011  . Colon polyp   . Deviated nasal septum    hx. of  . Diverticulosis   . DVT (deep venous thrombosis) ([place])   . GERD (gastroesophageal reflux disease)   . Hyperlipidemia   . Kidney stones   . Lipoma 12-24-11   multiple(present- lipoma 2-lt./4- rt arms/ back/ chest  . Liver cyst 10/11   4 simple cysts in liver  . 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 Prostate cancer ([place]) 12-24-11   dx. Prostate cancer after bx.  . Ulcerative colitis [facility])    Patient Active Problem List   Diagnosis Date Noted  . Lupus anticoagulant disorder ([place]) 04/30/2015    Priority: High  . History of DVT of lower extremity 08/31/2012    Priority: High  . Chronic pulmonary embolism ([place]) 04/13/2012    Priority: High  . History of prostate cancer 05/14/2011    Priority: High  . Diverticulitis 05/19/2018    Priority: Medium  . Urinary incontinence 09/20/2014    Priority: Medium  . Hyperglycemia 05/12/2012    Priority: Medium  . GERD 10/04/2007    Priority: Medium  . Hyperlipidemia 03/08/2007    Priority: Medium  . Diverticulosis 03/22/2014    Priority: Low  . Erectile dysfunction 03/22/2014    Priority: Low  . Personal history of colonic polyps 08/31/2012    Priority: Low  . Basal cell carcinoma     Priority: Low  . Obesity 10/08/2011    Priority: Low  . Saphenous vein  thrombophlebitis 03/21/2011    Priority: Low  . Kidney stone 06/02/2010    Priority: Low  . Allergic rhinitis 10/04/2007    Priority: Low  . Acute appendicitis 09/15/2019   Past Surgical History:  Procedure Laterality Date  . COLONOSCOPY    . CYSTOSCOPY  02/16/2012   Procedure: CYSTOSCOPY FLEXIBLE;  Surgeon: Bernestine Amass, MD;  [facility];  Service: Urology;  Laterality: N/A;  FLEX CYSTO WITH REMOVAL OF STAPLES   . INGUINAL HERNIA REPAIR  1990's   double  . KNEE SURGERY  2007   right knee scope  . LAPAROSCOPIC APPENDECTOMY N/A 09/15/2019   Procedure: APPENDECTOMY LAPAROSCOPIC;  Surgeon: Greer Pickerel, MD;  Location: [facility];  Service: General;  Laterality: N/A;  . lipoma removals  1990's   multiple and multiple remains now  . LYMPH NODE DISSECTION  12/31/2011   Procedure: LYMPH NODE DISSECTION;  Surgeon: Bernestine Amass, MD;  Location: [facility];  Service: Urology;  Laterality: Bilateral;  Bilateral  . POLYPECTOMY    . ROBOT ASSISTED LAPAROSCOPIC RADICAL PROSTATECTOMY  12/31/2011   Procedure: ROBOTIC ASSISTED LAPAROSCOPIC RADICAL PROSTATECTOMY;  Surgeon: Bernestine Amass, MD;  Location: [facility];  Service: Urology;  Laterality: N/A;      . TONSILLECTOMY      Family History  Problem Relation Age of Onset  . Colon cancer Mother 85  . Colon cancer Father 1  . Prostate cancer Paternal Grandfather   . Esophageal cancer Paternal Grandfather   . Stomach cancer Neg Hx   . Rectal cancer Neg Hx     Medications- reviewed and updated Current Outpatient Medications  Medication Sig Dispense Refill  . amoxicillin-clavulanate (AUGMENTIN) 875-125 MG tablet Take 1 tablet by mouth 2 (two) times daily for 7 days. 14 tablet 0  . apixaban (ELIQUIS) 5 MG TABS tablet Take 1 tablet (5 mg total) by mouth 2 (two) times daily. 180 tablet 3  . Ascorbic Acid (VITAMIN C) 1000 MG tablet Take 1,000 mg by mouth daily.    Marland Kitchen co-enzyme Q-10 50 MG capsule Take 200 mg by mouth daily.    .  ferrous sulfate 325 (65 FE) MG tablet Take 325 mg by mouth daily with breakfast.    . Multiple Vitamins-Minerals (MULTIVITAMIN WITH MINERALS) tablet Take 1 tablet by mouth daily.    Marland Kitchen omeprazole (PRILOSEC) 20 MG capsule TAKE 1 CAPSULE EVERY DAY 90 capsule 1  . rosuvastatin (CRESTOR) 10 MG tablet TAKE 1 TABLET EVERY DAY 90 tablet 3   No current facility-administered medications for this visit.    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       Hobbies: travels fair amount has timeshare and also travels more than that, enjoys Haematologist, home repair      Physician roster   Delfin Edis - gastroenterology in past   Burney Gauze - hematology (released around 2016)   Dr. Risa Grill - urology   Dr. Herbert Deaner - Ophthalmology   Dr. Nevada Crane- dermatology   Objective  Objective:  BP 112/84   Pulse 85   Temp 98 F (36.7 C) (Temporal)   Ht 6' (1.829 m)   Wt 255 lb 12.8 oz (116 kg)   SpO2 95%   BMI 34.69 kg/m  Gen: NAD, resting comfortably HEENT: Mucous membranes are moist. Oropharynx normal. Nasal turbinates with clear discharge and some swelling noted. Right Tm slightly red (but on augmentin) Neck: no thyromegaly CV: RRR no murmurs rubs or gallops Lungs: CTAB no crackles, wheeze, rhonchi Abdomen: soft/nontender/nondistended/normal bowel sounds. No rebound or guarding.  Ext: no edema Skin: warm, dry Neuro: grossly normal, moves all extremities, PERRLA   Assessment and Plan  75 y.o. male presenting for annual physical.  Health Maintenance counseling: 1. Anticipatory guidance: Patient counseled regarding regular dental exams - encouraged q6 months- goes at least yearly, eye exams -yearly,  avoiding smoking and second hand smoke , limiting alcohol to 2 beverages per day - 0 most weeks, rare social.   2. Risk factor reduction:  Advised patient  of need for regular exercise and diet rich and fruits and vegetables to reduce risk of heart attack and stroke. Exercise- limited by recent illness- had been doing some walking even despite PE. Diet-weight stbale form last CPE but had lost some weight and then regained- discussed working on dietary changes.  Wt Readings from Last 3 Encounters:  08/31/20 255 lb 12.8 oz (116 kg)  07/25/20 252 lb 6.4 oz (114.5 kg)  07/20/20 251 lb 8 oz (114.1 kg)  3. Immunizations/screenings/ancillary studies- declines covid 19 vaccine for now. Could consider prevnar 20 and shingrix at the pharmacy.  Immunization History  Administered Date(s) Administered  . Fluad Quad(high Dose  65+) 02/29/2020  . Influenza Split 03/19/2011, 03/26/2012  . Influenza Whole 03/15/2008, 02/22/2009, 02/25/2010  . Influenza, High Dose Seasonal PF 02/28/2015, 04/09/2016, 03/11/2017, 05/19/2018  . Influenza,inj,Quad PF,6+ Mos 03/18/2013, 03/07/2014  . Influenza,inj,quad, With Preservative 03/16/2020  . Pneumococcal Conjugate-13 04/30/2015  . Pneumococcal Polysaccharide-23 09/12/2013  . Pneumococcal-Unspecified 03/16/2020  . Td 11/01/2003, 09/12/2013  . Zoster 05/14/2011  4. Prostate cancer screening- prostatectomy after prior prostate cancer- still following with Dr. Louis Meckel regularly- on 12 month schedule- getting PSAs there  Lab Results  Component Value Date   PSA 0.015 10/18/2018   PSA 0.00 (L) 10/23/2015   PSA 0.00 Repeated and verified X2. (L) 09/05/2013   5. Colon cancer screening - October 2019 with 5 year follow up due to family history 6. Skin cancer screening- sees dermatology every year. advised regular sunscreen use. Denies worrisome, changing, or new skin lesions.  7. Never smoker 8. STD screening - only active with wife.   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 He uses this after he exerts himself or sometimes with activity itself- think she could probably do without it  -We discussed following up with pulmonary- plan was 3 months- patien twill call to schedule  -Last visit patient had a slight cough in the evening that went away-unfortunately a week ago developed new deep cough and recently patient had to be started on Augmentin through our office- Alyssa Allwardt, PA.  Significant improvement on augmentin- within 24 hours suggesting potential bacterial pneumonia  #hyperlipidemia S: Medication: Rosuvastatin 10Mg  Lab Results  Component Value Date   CHOL 167 08/29/2019   HDL 38.30 (L) 08/29/2019   LDLCALC 110 (H) 08/29/2019   LDLDIRECT 78 02/29/2020   TRIG 95.0 08/29/2019   CHOLHDL 4 08/29/2019   A/P: Hopefully stable with LDL at least below 80-update lipid panel with labs today   # Hyperglycemia/insulin resistance/prediabetes S:  Medication: none Exercise and diet- more sedentary after PE, trying to eat reasonably healthy- weight up a few lbs with sedentary activity. Hoping to get back to his walks after respiratory illness improves Lab Results  Component Value Date   HGBA1C 6.0 (H) 02/29/2020   HGBA1C 6.0 08/29/2019   HGBA1C 6.1 11/04/2016   A/P: Hopefully stable or improved-update A1c with labs today  # GERD S:Medication: Prilosec 20Mg  B12 levels related to PPI use: does not take b12 Lab Results  Component Value Date   VITAMINB12 244 08/29/2019  A/P: Reasonable control-continue current medication.  Update B12 level with labs today with long-term PPI use-if remains under 400 likely start supplement - does take MV with some b12  Recommended follow up: 3-6 months or sooner if  needed  Lab/Order associations: fasting   ICD-10-CM   1. Preventative health care  Z00.00   2. Gastroesophageal reflux disease, unspecified whether esophagitis present  K21.9   3. Hyperlipidemia, unspecified hyperlipidemia type  E78.5   4. Hyperglycemia  R73.9     No orders of the defined types were placed in this encounter.   Return precautions advised.  [name], MD

## 2020-08-31 ENCOUNTER — Encounter: Payer: Self-pay | Admitting: Family Medicine

## 2020-08-31 ENCOUNTER — Ambulatory Visit (INDEPENDENT_AMBULATORY_CARE_PROVIDER_SITE_OTHER): Payer: Medicare HMO | Admitting: Family Medicine

## 2020-08-31 ENCOUNTER — Other Ambulatory Visit: Payer: Self-pay

## 2020-08-31 VITALS — BP 112/84 | HR 85 | Temp 98.0°F | Ht 72.0 in | Wt 255.8 lb

## 2020-08-31 DIAGNOSIS — Z0000 Encounter for general adult medical examination without abnormal findings: Secondary | ICD-10-CM | POA: Diagnosis not present

## 2020-08-31 DIAGNOSIS — K219 Gastro-esophageal reflux disease without esophagitis: Secondary | ICD-10-CM

## 2020-08-31 DIAGNOSIS — I2782 Chronic pulmonary embolism: Secondary | ICD-10-CM | POA: Diagnosis not present

## 2020-08-31 DIAGNOSIS — D6862 Lupus anticoagulant syndrome: Secondary | ICD-10-CM

## 2020-08-31 DIAGNOSIS — R739 Hyperglycemia, unspecified: Secondary | ICD-10-CM | POA: Diagnosis not present

## 2020-08-31 DIAGNOSIS — Z79899 Other long term (current) drug therapy: Secondary | ICD-10-CM

## 2020-08-31 DIAGNOSIS — E785 Hyperlipidemia, unspecified: Secondary | ICD-10-CM | POA: Diagnosis not present

## 2020-08-31 DIAGNOSIS — I2602 Saddle embolus of pulmonary artery with acute cor pulmonale: Secondary | ICD-10-CM | POA: Diagnosis not present

## 2020-08-31 DIAGNOSIS — I2692 Saddle embolus of pulmonary artery without acute cor pulmonale: Secondary | ICD-10-CM

## 2020-08-31 LAB — COMPREHENSIVE METABOLIC PANEL
ALT: 36 U/L (ref 0–53)
AST: 20 U/L (ref 0–37)
Albumin: 4.2 g/dL (ref 3.5–5.2)
Alkaline Phosphatase: 79 U/L (ref 39–117)
BUN: 17 mg/dL (ref 6–23)
CO2: 26 mEq/L (ref 19–32)
Calcium: 9.5 mg/dL (ref 8.4–10.5)
Chloride: 102 mEq/L (ref 96–112)
Creatinine, Ser: 0.95 mg/dL (ref 0.40–1.50)
GFR: 79.59 mL/min (ref >60.00)
Glucose, Bld: 106 mg/dL — ABNORMAL HIGH (ref 70–99)
Potassium: 4.2 mEq/L (ref 3.5–5.1)
Sodium: 138 mEq/L (ref 135–145)
Total Bilirubin: 1.1 mg/dL (ref 0.2–1.2)
Total Protein: 6.4 g/dL (ref 6.0–8.3)

## 2020-08-31 LAB — CBC WITH DIFFERENTIAL/PLATELET
Basophils Absolute: 0.1 10*3/uL (ref 0.0–0.1)
Basophils Relative: 0.6 % (ref 0.0–3.0)
Eosinophils Absolute: 0.3 10*3/uL (ref 0.0–0.7)
Eosinophils Relative: 4.2 % (ref 0.0–5.0)
HCT: 44.9 % (ref 39.0–52.0)
Hemoglobin: 15.2 g/dL (ref 13.0–17.0)
Lymphocytes Relative: 12.4 % (ref 12.0–46.0)
Lymphs Abs: 1 10*3/uL (ref 0.7–4.0)
MCHC: 33.8 g/dL (ref 30.0–36.0)
MCV: 84.1 fl (ref 78.0–100.0)
Monocytes Absolute: 0.6 10*3/uL (ref 0.1–1.0)
Monocytes Relative: 7.2 % (ref 3.0–12.0)
Neutro Abs: 6.1 10*3/uL (ref 1.4–7.7)
Neutrophils Relative %: 75.6 % (ref 43.0–77.0)
Platelets: 356 10*3/uL (ref 150.0–400.0)
RBC: 5.34 Mil/uL (ref 4.22–5.81)
RDW: 15 % (ref 11.5–15.5)
WBC: 8 10*3/uL (ref 4.0–10.5)

## 2020-08-31 LAB — VITAMIN B12: Vitamin B-12: 449 pg/mL (ref 211–911)

## 2020-08-31 LAB — LIPID PANEL
Cholesterol: 144 mg/dL (ref 0–200)
HDL: 40.5 mg/dL (ref >39.00)
LDL Cholesterol: 82 mg/dL (ref 0–99)
NonHDL: 103.24
Total CHOL/HDL Ratio: 4
Triglycerides: 108 mg/dL (ref 0.0–149.0)
VLDL: 21.6 mg/dL (ref 0.0–40.0)

## 2020-08-31 LAB — HEMOGLOBIN A1C: Hgb A1c MFr Bld: 6.1 % (ref 4.6–6.5)

## 2020-09-08 ENCOUNTER — Encounter: Payer: Self-pay | Admitting: Family Medicine

## 2020-09-08 ENCOUNTER — Encounter: Payer: Self-pay | Admitting: Physician Assistant

## 2020-09-09 ENCOUNTER — Encounter: Payer: Self-pay | Admitting: Physician Assistant

## 2020-09-09 ENCOUNTER — Encounter: Payer: Self-pay | Admitting: Family Medicine

## 2020-09-10 ENCOUNTER — Other Ambulatory Visit: Payer: Self-pay

## 2020-09-10 DIAGNOSIS — J690 Pneumonitis due to inhalation of food and vomit: Secondary | ICD-10-CM

## 2020-09-11 ENCOUNTER — Ambulatory Visit (INDEPENDENT_AMBULATORY_CARE_PROVIDER_SITE_OTHER): Payer: Medicare HMO

## 2020-09-11 ENCOUNTER — Other Ambulatory Visit: Payer: Medicare HMO

## 2020-09-11 ENCOUNTER — Other Ambulatory Visit: Payer: Self-pay

## 2020-09-11 DIAGNOSIS — J690 Pneumonitis due to inhalation of food and vomit: Secondary | ICD-10-CM

## 2020-09-11 DIAGNOSIS — J189 Pneumonia, unspecified organism: Secondary | ICD-10-CM | POA: Diagnosis not present

## 2020-09-11 DIAGNOSIS — K449 Diaphragmatic hernia without obstruction or gangrene: Secondary | ICD-10-CM | POA: Diagnosis not present

## 2020-09-20 DIAGNOSIS — I2699 Other pulmonary embolism without acute cor pulmonale: Secondary | ICD-10-CM | POA: Diagnosis not present

## 2020-10-15 DIAGNOSIS — Z8546 Personal history of malignant neoplasm of prostate: Secondary | ICD-10-CM | POA: Diagnosis not present

## 2020-10-15 LAB — PSA: PSA: 0.021

## 2020-10-17 ENCOUNTER — Ambulatory Visit: Payer: Medicare HMO | Admitting: Pulmonary Disease

## 2020-10-17 ENCOUNTER — Other Ambulatory Visit: Payer: Self-pay

## 2020-10-17 ENCOUNTER — Encounter: Payer: Self-pay | Admitting: Pulmonary Disease

## 2020-10-17 ENCOUNTER — Telehealth: Payer: Self-pay | Admitting: Hematology and Oncology

## 2020-10-17 VITALS — BP 120/88 | HR 76 | Ht 72.0 in | Wt 257.0 lb

## 2020-10-17 DIAGNOSIS — D6862 Lupus anticoagulant syndrome: Secondary | ICD-10-CM | POA: Diagnosis not present

## 2020-10-17 DIAGNOSIS — I2602 Saddle embolus of pulmonary artery with acute cor pulmonale: Secondary | ICD-10-CM | POA: Diagnosis not present

## 2020-10-17 NOTE — Telephone Encounter (Signed)
Received a new hem referral from [facility] for pe. Nicholas Owen has been cld and scheduled to see Dr. Chryl Heck on 5/19 at [place] to arrive 20 minutes early.

## 2020-10-17 NOTE — Patient Instructions (Addendum)
Continue eliquis 5mg  twice daily.    We will refer you to the Hematology Clinic for further evaluation and discussion.   We will check an ECHO and a V/Q scan in August and have you follow up in September

## 2020-10-17 NOTE — Progress Notes (Signed)
Synopsis: Referred in May 2022 for hospital follow up of pulmonary embolism  Subjective:   PATIENT ID: Nicholas Owen GENDER: male DOB: 1945-10-02, MRN: [id number]   HPI  Chief Complaint  Patient presents with  . Hospitalization Follow-up    Hospital follow up from pulmonary emboism, 07/2020    Nicholas Owen is a 75 year old male, never smoker with history of lupus anticoagulant, pulmonary embolism in 2013 and recent admission 07/16/20 - 07/20/20 for saddle pulmonary embolus who is referred to pulmonary clinic for hospital follow up.   He is currently on eliquis 5mg  twice daily and feeling much better. His shortness of breath and chest pains are resolved. He lives a fairly sedentary life due to chronic right knee pain   He was previously followed by hematology for the DVT/PE in 2013 to 2016. He completed 2 years of xarelto in November 2015 and was transitioned to 325mg  aspirin daily. Lupus anticoagulant testing was performed on 10/01/2014 which was positived. He remained on 325mg  aspirin daily since his last visit with hematology in 2016.   Past Medical History:  Diagnosis Date  . Allergy   . Asbestos exposure   . Basal cell carcinoma    skin of left ear  . BPH (benign prostatic hyperplasia)   . Cancer ([place])    basal cell CA (skin)  . Colitis 01/2011  . Colon polyp   . Deviated nasal septum    hx. of  . Diverticulosis   . DVT (deep venous thrombosis) ([place])   . GERD (gastroesophageal reflux disease)   . Hyperlipidemia   . Kidney stones   . Lipoma 12-24-11   multiple(present- lipoma 2-lt./4- rt arms/ back/ chest  . Liver cyst 10/11   4 simple cysts in liver  . 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 Prostate cancer ([place]) 12-24-11   dx. Prostate cancer after bx.  . Ulcerative colitis ([place])      Family History  Problem Relation Age of Onset  . Colon cancer Mother 89  . Colon cancer Father 30  . Prostate cancer Paternal Grandfather   .  Esophageal cancer Paternal Grandfather   . Stomach cancer Neg Hx   . Rectal cancer Neg Hx      Social History   Socioeconomic History  . Marital status: Married    Spouse name: Not on file  . Number of children: Not on file  . Years of education: Not on file  . Highest education level: Not on file  Occupational History  . Occupation: Retired    Fish farm manager: RETIRED  Tobacco Use  . Smoking status: Never Smoker  . Smokeless tobacco: Never Used  . Tobacco comment: never used tobacco  Vaping Use  . Vaping Use: Never used  Substance and Sexual Activity  . Alcohol use: Yes    Comment: occasional   . Drug use: No  . Sexual activity: Not on file  Other Topics Concern  . 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       Hobbies: travels fair amount has timeshare and also travels more than that, enjoys Haematologist, home repair      Physician roster   Delfin Edis - gastroenterology in past   Burney Gauze - hematology (released around 2016)   Dr. Louis Meckel (prior [place]) - urology   Dr. Herbert Deaner - Ophthalmology  Dr. Nevada Crane- dermatology   Social Determinants of Health   Financial Resource Strain: Not on file  Food Insecurity: Not on file  Transportation Needs: Not on file  Physical Activity: Not on file  Stress: Not on file  Social Connections: Not on file  Intimate Partner Violence: Not on file     No Known Allergies   Outpatient Medications Prior to Visit  Medication Sig Dispense Refill  . apixaban (ELIQUIS) 5 MG TABS tablet TAKE 1 TABLET (5 MG TOTAL) BY MOUTH 2 (TWO) TIMES DAILY. 60 tablet 3  . Ascorbic Acid (VITAMIN C) 1000 MG tablet Take 1,000 mg by mouth daily.    Marland Kitchen co-enzyme Q-10 50 MG capsule Take 200 mg by mouth daily.    . ferrous sulfate 325 (65 FE) MG tablet Take 325 mg by mouth daily with breakfast.    . Multiple Vitamins-Minerals (MULTIVITAMIN WITH MINERALS) tablet Take  1 tablet by mouth daily.    Marland Kitchen omeprazole (PRILOSEC) 20 MG capsule TAKE 1 CAPSULE EVERY DAY 90 capsule 1  . rosuvastatin (CRESTOR) 10 MG tablet TAKE 1 TABLET EVERY DAY 90 tablet 3  . apixaban (ELIQUIS) 5 MG TABS tablet Take 1 tablet (5 mg total) by mouth 2 (two) times daily. 180 tablet 3  . apixaban (ELIQUIS) 5 MG TABS tablet TAKE 2 TABLETS (10 MG TOTAL) BY MOUTH TWO TIMES DAILY FOR 6 DAYS.THEN TAKE 1 TABLET BY MOUTH 2 TIMES DAILY 74 tablet 0   No facility-administered medications prior to visit.    Review of Systems  Constitutional: Negative for chills, fever, malaise/fatigue and weight loss.  HENT: Negative for congestion, sinus pain and sore throat.   Eyes: Negative.   Respiratory: Negative for cough, hemoptysis, sputum production, shortness of breath and wheezing.   Cardiovascular: Positive for leg swelling. Negative for chest pain, palpitations, orthopnea and claudication.  Gastrointestinal: Negative for abdominal pain, heartburn, nausea and vomiting.  Genitourinary: Negative.   Musculoskeletal: Positive for joint pain. Negative for myalgias.  Skin: Negative for rash.  Neurological: Negative for weakness.  Endo/Heme/Allergies: Negative.   Psychiatric/Behavioral: Negative.       Objective:   Vitals:   10/17/20 1132  BP: 120/88  Pulse: 76  SpO2: 96%  Weight: 257 lb (116.6 kg)  Height: 6' (1.829 m)     Physical Exam Constitutional:      General: He is not in acute distress.    Appearance: Normal appearance. He is obese.  HENT:     Head: Normocephalic and atraumatic.  Eyes:     Extraocular Movements: Extraocular movements intact.     Conjunctiva/sclera: Conjunctivae normal.     Pupils: Pupils are equal, round, and reactive to light.  Cardiovascular:     Rate and Rhythm: Normal rate and regular rhythm.     Pulses: Normal pulses.     Heart sounds: Normal heart sounds. No murmur heard.   Pulmonary:     Effort: Pulmonary effort is normal.     Breath sounds: Normal  breath sounds.  Abdominal:     General: Bowel sounds are normal.     Palpations: Abdomen is soft.  Musculoskeletal:     Right lower leg: Edema present.     Left lower leg: No edema.  Lymphadenopathy:     Cervical: No cervical adenopathy.  Skin:    General: Skin is warm and dry.  Neurological:     General: No focal deficit present.     Mental Status: He is alert.  Psychiatric:  Mood and Affect: Mood normal.        Behavior: Behavior normal.        Thought Content: Thought content normal.        Judgment: Judgment normal.       CBC    Component Value Date/Time   WBC 8.0 08/31/2020 1125   RBC 5.34 08/31/2020 1125   HGB 15.2 08/31/2020 1125   HGB 15.3 10/17/2014 0948   HCT 44.9 08/31/2020 1125   HCT 43.5 10/17/2014 0948   PLT 356.0 08/31/2020 1125   PLT 261 10/17/2014 0948   MCV 84.1 08/31/2020 1125   MCV 86 10/17/2014 0948   MCH 28.8 07/20/2020 0100   MCHC 33.8 08/31/2020 1125   RDW 15.0 08/31/2020 1125   RDW 13.4 10/17/2014 0948   LYMPHSABS 1.0 08/31/2020 1125   LYMPHSABS 1.2 10/17/2014 0948   MONOABS 0.6 08/31/2020 1125   EOSABS 0.3 08/31/2020 1125   EOSABS 0.8 (H) 10/17/2014 0948   BASOSABS 0.1 08/31/2020 1125   BASOSABS 0.0 10/17/2014 0948     Chest imaging: CTA Chest 07/16/20 Large, saddle type pulmonary embolism. Positive for acute PE with CT evidence of right heart strain (RV/LV Ratio = 1.4) consistent with at least submassive (intermediate risk) PE.   Hiatal hernia.  PFT: No flowsheet data found.  Labs: 10/17/2014 - Lupus anticoagulant detected (supposedly off anticoagulation at this time)  Path:  Echo 07/17/20: 1. Left ventricular ejection fraction, by estimation, is 55 to 60%. The  left ventricle has normal function. The left ventricle has no regional  wall motion abnormalities. There is mild concentric left ventricular  hypertrophy. Left ventricular diastolic  parameters are consistent with Grade I diastolic dysfunction (impaired   relaxation). There is the interventricular septum is flattened in systole,  consistent with right ventricular pressure overload.  2. McConnell's sign is seen, strongly suggestive of large pulmonary  embolism, RV free wall is severely hypokinetic. The apex is hyperdynamic  and tricuspid annulus excursion is mildly reduced. Right ventricular  systolic function is moderately reduced.  The right ventricular size is severely enlarged. There is mildly elevated  pulmonary artery systolic pressure. The estimated right ventricular  systolic pressure is 23.7 mmHg.  3. Right atrial size was mild to moderately dilated.  4. The mitral valve is normal in structure. No evidence of mitral valve  regurgitation.  5. The aortic valve is tricuspid. Aortic valve regurgitation is not  visualized. No aortic stenosis is present.  6. Aortic dilatation noted. There is mild dilatation of the ascending  aorta, measuring 39 mm.   Lower Extremity US 07/17/20 RIGHT:  - Findings consistent with age indeterminate deep vein thrombosis  involving the right femoral vein, right popliteal vein, right posterior  tibial veins, and right peroneal veins.  - No cystic structure found in the popliteal fossa.    LEFT:  - There is no evidence of deep vein thrombosis in the lower extremity.  - No cystic structure found in the popliteal fossa.  Assessment & Plan:   Acute saddle pulmonary embolism with acute cor pulmonale (HCC) - Plan: ECHOCARDIOGRAM COMPLETE, NM Pulmonary Per & Vent, Ambulatory referral to Hematology / Oncology  Lupus anticoagulant disorder [facility])  Discussion: Nicholas Owen is a 75 year old male, never smoker with history of lupus anticoagulant, pulmonary embolism in 2013 and recent admission 07/16/20 - 07/20/20 for saddle pulmonary embolus with acute cor pulmonale who is referred to pulmonary clinic for hospital follow up.   Patient is currently doing well on eliquis  5mg  twice daily with resolution of  his chest pains and shortness of breath related to the acute saddle pulmonary embolus.   We discussed therapy options moving forward. I recommended that he transitioned to warfarin therapy as this is the anticoagulant per the guidelines for thromboembolic disease in the setting of lupus anticoagulant disease. Eliquis would be the next best therapy.   I also recommended that he be referred to hematology for further discussion on his anticoagulation therapy which he is amenable to this, so referral has been placed.   He will continue eliquis therapy for now.   We will check an ECHO and V/Q scan in August of this year for follow up of his recent DVT/PE.  Follow up in 4 months.  >60 minutes was spent on this visit reviewing records, placing orders and completing documentation with 50% of the time involved with direct patent care.  Freda Jackson, MD [facility] Office: [phone]    Current Outpatient Medications:  .  apixaban (ELIQUIS) 5 MG TABS tablet, TAKE 1 TABLET (5 MG TOTAL) BY MOUTH 2 (TWO) TIMES DAILY., Disp: 60 tablet, Rfl: 3 .  Ascorbic Acid (VITAMIN C) 1000 MG tablet, Take 1,000 mg by mouth daily., Disp: , Rfl:  .  co-enzyme Q-10 50 MG capsule, Take 200 mg by mouth daily., Disp: , Rfl:  .  ferrous sulfate 325 (65 FE) MG tablet, Take 325 mg by mouth daily with breakfast., Disp: , Rfl:  .  Multiple Vitamins-Minerals (MULTIVITAMIN WITH MINERALS) tablet, Take 1 tablet by mouth daily., Disp: , Rfl:  .  omeprazole (PRILOSEC) 20 MG capsule, TAKE 1 CAPSULE EVERY DAY, Disp: 90 capsule, Rfl: 1 .  rosuvastatin (CRESTOR) 10 MG tablet, TAKE 1 TABLET EVERY DAY, Disp: 90 tablet, Rfl: 3

## 2020-10-18 ENCOUNTER — Encounter: Payer: Self-pay | Admitting: Hematology and Oncology

## 2020-10-18 ENCOUNTER — Telehealth: Payer: Self-pay

## 2020-10-18 ENCOUNTER — Inpatient Hospital Stay: Payer: Medicare HMO | Attending: Hematology and Oncology | Admitting: Hematology and Oncology

## 2020-10-18 ENCOUNTER — Encounter: Payer: Self-pay | Admitting: Pulmonary Disease

## 2020-10-18 ENCOUNTER — Inpatient Hospital Stay: Payer: Medicare HMO

## 2020-10-18 ENCOUNTER — Other Ambulatory Visit: Payer: Self-pay

## 2020-10-18 VITALS — BP 126/83 | HR 76 | Temp 97.6°F | Resp 19 | Ht 72.0 in | Wt 259.7 lb

## 2020-10-18 DIAGNOSIS — Z8546 Personal history of malignant neoplasm of prostate: Secondary | ICD-10-CM | POA: Insufficient documentation

## 2020-10-18 DIAGNOSIS — Z79899 Other long term (current) drug therapy: Secondary | ICD-10-CM | POA: Insufficient documentation

## 2020-10-18 DIAGNOSIS — Z7901 Long term (current) use of anticoagulants: Secondary | ICD-10-CM | POA: Diagnosis not present

## 2020-10-18 DIAGNOSIS — Z9079 Acquired absence of other genital organ(s): Secondary | ICD-10-CM | POA: Insufficient documentation

## 2020-10-18 DIAGNOSIS — I2602 Saddle embolus of pulmonary artery with acute cor pulmonale: Secondary | ICD-10-CM

## 2020-10-18 DIAGNOSIS — D6862 Lupus anticoagulant syndrome: Secondary | ICD-10-CM

## 2020-10-18 DIAGNOSIS — Z86711 Personal history of pulmonary embolism: Secondary | ICD-10-CM | POA: Insufficient documentation

## 2020-10-18 NOTE — Telephone Encounter (Signed)
Provided patient with instructions on setting up an appointment with Dr. Yong Channel and the coumadin clinic. Patient advised to continue on eliquis in the meantime. Patient will be followed by Dr. Yong Channel for bridging eliquis to coumadin and further anticoagulant therapy.  Patient verbalized an understanding of the instructions.

## 2020-10-18 NOTE — Assessment & Plan Note (Signed)
This is a very pleasant 75 year old male patient with a past medical history significant for SVT many years ago, PE back in 2013 and most recently saddle PE in 2022 and previously thought to have lupus anticoagulant now on Eliquis referred to hematology for anticoagulation recommendations.  Nicholas Owen is here for an initial visit with his wife.  He was apparently told by his pulmonologist that he may have to switch anticoagulants given concern for lupus anticoagulant in the past and hence referred to hematology.  He has been doing well on Eliquis, his shortness of breath has improved since his PE.  He has been having no issues with complications from the blood thinners. Physical examination today unremarkable except for obesity, post thrombotic changes in the right lower extremity. I have reviewed all pertinent medical records and it appears that he has had positive lupus anticoagulant testing at least once in January 2016 when he was off of Xarelto.  I cannot state with clarity if his prior lupus anticoagulant tests were done while on Xarelto or while he was on any other blood thinner.  One thing we have talked about for sure is that he has to remain on anticoagulation indefinitely at this point.  I have clearly explained to the patient that novel anticoagulants are inferior to Coumadin in the setting of antiphospholipid antibody syndrome and lupus anticoagulant testing while on Eliquis can lead to false positive results.  I have explained to him that he needs to transition to warfarin and he would like to retest the lupus anticoagulant labs during his next visit when he is on warfarin.  It is unlikely that warfarin interferes with lupus anticoagulant testing hence I agree with his plan that he should transition to warfarin, return to clinic for follow-up with [place] and we can redo the antiphospholipid antibody testing.  I have discussed with Dr. Yong Channel his primary care physician who graciously excepted a  request to monitor his Coumadin since the have a Coumadin clinic and patient will be informed to contact his primary care physician for further management of anticoagulation.  He should transition to Coumadin as recommended by the anticoagulation clinic.  He should return to clinic in 4 weeks with [place].

## 2020-10-18 NOTE — Assessment & Plan Note (Signed)
Continue annual FU per Urology. No concern for recurrence based on history today

## 2020-10-18 NOTE — Progress Notes (Signed)
[facility] NOTE  Patient Care Team: Nicholas Olp, MD as PCP - General (Family Medicine) Nicholas Hughs, MD as Consulting Physician (Urology)  CHIEF COMPLAINTS/PURPOSE OF CONSULTATION:  PE, anticoagulation recommendations, lupus anticoagulant.  ASSESSMENT & PLAN:   This is a very pleasant 75 year old male patient with a past medical history significant for SVT many years ago, PE back in 2013 and most recently saddle PE in 2022 and previously thought to have lupus anticoagulant now on Eliquis referred to hematology for anticoagulation recommendations.  [name] is here for an initial visit with his wife.  He was apparently told by his pulmonologist that he may have to switch anticoagulants given concern for lupus anticoagulant in the past and hence referred to hematology.  He has been doing well on Eliquis, his shortness of breath has improved since his PE.  He has been having no issues with complications from the blood thinners. Physical examination today unremarkable except for obesity, post thrombotic changes in the right lower extremity. I have reviewed all pertinent medical records and it appears that he has had positive lupus anticoagulant testing at least once in January 2016 when he was off of Xarelto.  I cannot state with clarity if his prior lupus anticoagulant tests were done while on Xarelto or while he was on any other blood thinner.  One thing we have talked about for sure is that he has to remain on anticoagulation indefinitely at this point.  I have clearly explained to the patient that novel anticoagulants are inferior to Coumadin in the setting of antiphospholipid antibody syndrome and lupus anticoagulant testing while on Eliquis can lead to false positive results.  I have explained to him that he needs to transition to warfarin and he would like to retest the lupus anticoagulant labs during his next visit when he is on warfarin.  It is unlikely  that warfarin interferes with lupus anticoagulant testing hence I agree with his plan that he should transition to warfarin, return to clinic for follow-up with [place] and we can redo the antiphospholipid antibody testing.  I have discussed with Dr. Yong Owen his primary care physician who graciously excepted a request to monitor his Coumadin since the have a Coumadin clinic and patient will be informed to contact his primary care physician for further management of anticoagulation.  He should transition to Coumadin as recommended by the anticoagulation clinic.  With regards to his prostate cancer, he can continue follow-up with his urologist annually per recommendations. He should also do all age-appropriate cancer screening  He should return to clinic in 4 weeks with [place].  HISTORY OF PRESENTING ILLNESS:  Nicholas Owen 75 y.o. male is here because of history of PE and lupus anticoagulant.  Patient is a decent historian.  He remembers that in the past, he may have had a blood clot cannot remember superficial versus deep after his right knee replacement.  Then in 2013 he was diagnosed with bilateral pulmonary embolism and was started on anticoagulation with Xarelto which he took for about 2 years until November 2015.  He followed up with Dr. Marin Owen during that time and was thought to have lupus anticoagulant.  Lupus anticoagulant testing was done in 2014 and in January 2016.  He may have been on the blood thinner in 2014 but he was clearly off of anticoagulation for at least 2 to 3 months before he was retested in January 2016 based on my review of records from Dr. Marin Owen and  Ms. Nicholas Owen who is Dr. Antonieta Owen physician assistant.  Most recently in February 2022 he presented with shortness of breath while walking to the car which was sudden onset.  He was only on full dose aspirin at the time of presentation.  He was found to have large saddle PE with right heart strain consistent with at least submassive  PE, started on heparin drip and transition to Eliquis.  He is now referred to hematology for anticoagulation recommendations since there was concern in the past that he had lupus anticoagulant  and if he needs to be transition to warfarin.  He has past medical history significant for prostate cancer status post robot-assisted laparoscopic radical prostatectomy in July 2013 with lymph node dissection. He is currently feeling well from shortness of breath standpoint.  He denies any ongoing chest pain.  He is taking his Eliquis as prescribed.  No bleeding issues.  He now follows up with urology once a year since he has been cancer free for several years.  He continues to do well with urinary incontinence issues since prostatectomy.  Rest of the pertinent 10 point ROS reviewed and negative.  REVIEW OF SYSTEMS:   Constitutional: Denies fevers, chills or abnormal night sweats Eyes: Denies blurriness of vision, double vision or watery eyes Ears, nose, mouth, throat, and face: Denies mucositis or sore throat Respiratory: As mentioned in the history of presenting illness  cardiovascular: Denies palpitation, chest discomfort  Gastrointestinal:  Denies nausea, heartburn or change in bowel habits Skin: Denies abnormal skin rashes Lymphatics: Denies new lymphadenopathy or easy bruising Neurological:Denies numbness, tingling or new weaknesses Behavioral/Psych: Mood is stable, no new changes  All other systems were reviewed with the patient and are negative.  MEDICAL HISTORY:  Past Medical History:  Diagnosis Date  . Allergy   . Asbestos exposure   . Basal cell carcinoma    skin of left ear  . BPH (benign prostatic hyperplasia)   . Cancer ([place])    basal cell CA (skin)  . Colitis 01/2011  . Colon polyp   . Deviated nasal septum    hx. of  . Diverticulosis   . DVT (deep venous thrombosis) ([place])   . GERD (gastroesophageal reflux disease)   . Hyperlipidemia   . Kidney stones   . Lipoma 12-24-11    multiple(present- lipoma 2-lt./4- rt arms/ back/ chest  . Liver cyst 10/11   4 simple cysts in liver  . Prostate cancer ([place]) 10/06/11   Gleason 3+4=7, vol 39 cc, PSA 5.15  . Prostate cancer ([place]) 12-24-11   dx. Prostate cancer after bx.  . Ulcerative colitis ([place])     SURGICAL HISTORY: Past Surgical History:  Procedure Laterality Date  . COLONOSCOPY    . CYSTOSCOPY  02/16/2012   Procedure: CYSTOSCOPY FLEXIBLE;  Surgeon: Bernestine Amass, MD;  Location: [facility];  Service: Urology;  Laterality: N/A;  FLEX CYSTO WITH REMOVAL OF STAPLES   . INGUINAL HERNIA REPAIR  1990's   double  . KNEE SURGERY  2007   right knee scope  . LAPAROSCOPIC APPENDECTOMY N/A 09/15/2019   Procedure: APPENDECTOMY LAPAROSCOPIC;  Surgeon: Greer Pickerel, MD;  Location: [facility];  Service: General;  Laterality: N/A;  . lipoma removals  1990's   multiple and multiple remains now  . LYMPH NODE DISSECTION  12/31/2011   Procedure: LYMPH NODE DISSECTION;  Surgeon: Bernestine Amass, MD;  Location: [facility];  Service: Urology;  Laterality: Bilateral;  Bilateral  . POLYPECTOMY    .  ROBOT ASSISTED LAPAROSCOPIC RADICAL PROSTATECTOMY  12/31/2011   Procedure: ROBOTIC ASSISTED LAPAROSCOPIC RADICAL PROSTATECTOMY;  Surgeon: Bernestine Amass, MD;  Location: [facility];  Service: Urology;  Laterality: N/A;      . TONSILLECTOMY      SOCIAL HISTORY: Social History   Socioeconomic History  . Marital status: Married    Spouse name: Not on file  . Number of children: Not on file  . Years of education: Not on file  . Highest education level: Not on file  Occupational History  . Occupation: Retired    Fish farm manager: RETIRED  Tobacco Use  . Smoking status: Never Smoker  . Smokeless tobacco: Never Used  . Tobacco comment: never used tobacco  Vaping Use  . Vaping Use: Never used  Substance and Sexual Activity  . Alcohol use: Yes    Comment: occasional   . Drug use: No  . Sexual activity: Not on file  Other Topics Concern  .  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       Hobbies: travels fair amount has timeshare and also travels more than that, enjoys Haematologist, home repair      Physician roster   Delfin Edis - gastroenterology in past   Burney Gauze - hematology (released around 2016)   Dr. Louis Meckel (prior [place]) - urology   Dr. Herbert Deaner - Ophthalmology   Dr. Nevada Crane- dermatology   Social Determinants of Health   Financial Resource Strain: Not on file  Food Insecurity: Not on file  Transportation Needs: Not on file  Physical Activity: Not on file  Stress: Not on file  Social Connections: Not on file  Intimate Partner Violence: Not on file    FAMILY HISTORY: Family History  Problem Relation Age of Onset  . Colon cancer Mother 53  . Colon cancer Father 44  . Prostate cancer Paternal Grandfather   . Esophageal cancer Paternal Grandfather   . Stomach cancer Neg Hx   . Rectal cancer Neg Hx     ALLERGIES:  has No Known Allergies.  MEDICATIONS:  Current Outpatient Medications  Medication Sig Dispense Refill  . apixaban (ELIQUIS) 5 MG TABS tablet TAKE 1 TABLET (5 MG TOTAL) BY MOUTH 2 (TWO) TIMES DAILY. 60 tablet 3  . co-enzyme Q-10 50 MG capsule Take 200 mg by mouth daily.    Marland Kitchen loratadine (CLARITIN) 10 MG tablet Take 10 mg by mouth daily as needed for allergies.    . Multiple Vitamins-Minerals (MULTIVITAMIN WITH MINERALS) tablet Take 1 tablet by mouth daily.    . Multiple Vitamins-Minerals (VITAMIN D3 COMPLETE) TABS Take 1 tablet by mouth.    . Multiple Vitamins-Minerals (ZINC PO) Take 1 tablet by mouth 1 day or 1 dose.    Marland Kitchen omeprazole (PRILOSEC) 20 MG capsule TAKE 1 CAPSULE EVERY DAY 90 capsule 1  . rosuvastatin (CRESTOR) 10 MG tablet TAKE 1 TABLET EVERY DAY 90 tablet 3  . Ascorbic Acid (VITAMIN C) 1000 MG tablet Take 1,000 mg by mouth daily. (Patient not taking: Reported on 10/18/2020)    . ferrous  sulfate 325 (65 FE) MG tablet Take 325 mg by mouth daily with breakfast. (Patient not taking: Reported on 10/18/2020)     No current facility-administered medications for this visit.    PHYSICAL EXAMINATION: ECOG PERFORMANCE STATUS: 0 - Asymptomatic  Vitals:   10/18/20 1004  BP: 126/83  Pulse: 76  Resp: 19  Temp:  97.6 F (36.4 C)  SpO2: 95%   Filed Weights   10/18/20 1004  Weight: 259 lb 11.2 oz (117.8 kg)    GENERAL:alert, no distress and comfortable SKIN: skin color, texture, turgor are normal, no rashes or significant lesions EYES: normal, conjunctiva are pink and non-injected, sclera clear OROPHARYNX:no exudate, no erythema and lips, buccal mucosa, and tongue normal  NECK: supple, thyroid normal size, non-tender, without nodularity LYMPH:  no palpable lymphadenopathy in the cervical, axillary or inguinal LUNGS: clear to auscultation and percussion with normal breathing effort HEART: regular rate & rhythm and no murmurs and no lower extremity edema ABDOMEN:abdomen soft, non-tender and normal bowel sounds Musculoskeletal:no cyanosis of digits and no clubbing  PSYCH: alert & oriented x 3 with fluent speech NEURO: no focal motor/sensory deficits  LABORATORY DATA:  I have reviewed the data as listed Lab Results  Component Value Date   WBC 8.0 08/31/2020   HGB 15.2 08/31/2020   HCT 44.9 08/31/2020   MCV 84.1 08/31/2020   PLT 356.0 08/31/2020     Chemistry      Component Value Date/Time   NA 138 08/31/2020 1125   NA 139 06/16/2014 1206   K 4.2 08/31/2020 1125   K 4.4 06/16/2014 1206   CL 102 08/31/2020 1125   CO2 26 08/31/2020 1125   CO2 24 06/16/2014 1206   BUN 17 08/31/2020 1125   BUN 17.2 06/16/2014 1206   CREATININE 0.95 08/31/2020 1125   CREATININE 0.98 02/29/2020 1140   CREATININE 0.9 06/16/2014 1206      Component Value Date/Time   CALCIUM 9.5 08/31/2020 1125   CALCIUM 8.9 06/16/2014 1206   ALKPHOS 79 08/31/2020 1125   ALKPHOS 74 06/16/2014 1206    AST 20 08/31/2020 1125   AST 19 06/16/2014 1206   ALT 36 08/31/2020 1125   ALT 22 06/16/2014 1206   BILITOT 1.1 08/31/2020 1125   BILITOT 0.92 06/16/2014 1206      RADIOGRAPHIC STUDIES: I have personally reviewed the radiological images as listed and agreed with the findings in the report. No results found.  All questions were answered. The patient knows to call the clinic with any problems, questions or concerns. I spent 60 minutes in the care of this patient including H and P, review of records, counseling and coordination of care. I have reviewed pertinent records from 2013, reviewed multiple imaging reports, pertinent labs, discussed about lupus anticoagulant and antiphospholipid antibody syndrome recommended anticoagulant and duration of anticoagulation in detail with the patient.    Benay Pike, MD 10/18/2020 11:55 AM

## 2020-10-22 DIAGNOSIS — N393 Stress incontinence (female) (male): Secondary | ICD-10-CM | POA: Diagnosis not present

## 2020-10-22 DIAGNOSIS — N5231 Erectile dysfunction following radical prostatectomy: Secondary | ICD-10-CM | POA: Diagnosis not present

## 2020-10-22 DIAGNOSIS — Z8546 Personal history of malignant neoplasm of prostate: Secondary | ICD-10-CM | POA: Diagnosis not present

## 2020-10-25 ENCOUNTER — Encounter: Payer: Self-pay | Admitting: Family Medicine

## 2020-10-31 ENCOUNTER — Other Ambulatory Visit: Payer: Self-pay

## 2020-10-31 ENCOUNTER — Ambulatory Visit (INDEPENDENT_AMBULATORY_CARE_PROVIDER_SITE_OTHER): Payer: Medicare HMO | Admitting: General Practice

## 2020-10-31 DIAGNOSIS — Z7901 Long term (current) use of anticoagulants: Secondary | ICD-10-CM

## 2020-10-31 DIAGNOSIS — D6859 Other primary thrombophilia: Secondary | ICD-10-CM | POA: Insufficient documentation

## 2020-10-31 MED ORDER — WARFARIN SODIUM 5 MG PO TABS: ORAL_TABLET | ORAL | 1 refills | Status: DC

## 2020-10-31 NOTE — Patient Instructions (Signed)
Pre visit review using our clinic review tool, if applicable. No additional management support is needed unless otherwise documented below in the visit note.  Please take Eliquis and 1 tablet of warfarin today, tomorrow and Friday (6/1, 6/2 and 6/3).  On Saturday (6/4) stop the Eliquis and continue to take 1 (5 mg) tablet of warfarin daily.  Re-check on Tuesday 6/7 at the [facility] office ([address], 2nd floor).   A full discussion of the nature of anticoagulants has been carried out.  A benefit risk analysis has been presented to the patient, so that they understand the justification for choosing anticoagulation at this time. The need for frequent and regular monitoring, precise dosage adjustment and compliance is stressed.  Side effects of potential bleeding are discussed.  The patient should avoid any OTC items containing aspirin or ibuprofen, and should avoid great swings in general diet.  Avoid alcohol consumption.  Call if any signs of abnormal bleeding.

## 2020-10-31 NOTE — Progress Notes (Signed)
I have reviewed and agree with note, evaluation, plan.   Katena Petitjean, MD  

## 2020-11-06 ENCOUNTER — Other Ambulatory Visit: Payer: Self-pay

## 2020-11-06 ENCOUNTER — Ambulatory Visit (INDEPENDENT_AMBULATORY_CARE_PROVIDER_SITE_OTHER): Payer: Medicare HMO | Admitting: General Practice

## 2020-11-06 DIAGNOSIS — Z7901 Long term (current) use of anticoagulants: Secondary | ICD-10-CM

## 2020-11-06 DIAGNOSIS — D6859 Other primary thrombophilia: Secondary | ICD-10-CM

## 2020-11-06 LAB — POCT INR: INR: 2 (ref 2.0–3.0)

## 2020-11-06 NOTE — Patient Instructions (Signed)
Pre visit review using our clinic review tool, if applicable. No additional management support is needed unless otherwise documented below in the visit note.  Take 1 1/2 tablets (7.5 mg) today and then take 1 tablet daily until checked next Tuesday the 14 th at the [facility] office ([address] 2nd floor).

## 2020-11-06 NOTE — Progress Notes (Signed)
Medical screening examination/treatment/procedure(s) were performed by non-physician practitioner and as supervising physician I was immediately available for consultation/collaboration. I agree with above. Liel Rudden, MD   

## 2020-11-09 ENCOUNTER — Ambulatory Visit (INDEPENDENT_AMBULATORY_CARE_PROVIDER_SITE_OTHER): Payer: Medicare HMO

## 2020-11-09 ENCOUNTER — Other Ambulatory Visit: Payer: Self-pay

## 2020-11-09 DIAGNOSIS — Z0000 Encounter for general adult medical examination without abnormal findings: Secondary | ICD-10-CM

## 2020-11-09 NOTE — Patient Instructions (Signed)
Nicholas Owen , Thank you for taking time to come for your Medicare Wellness Visit. I appreciate your ongoing commitment to your health goals. Please review the following plan we discussed and let me know if I can assist you in the future.   Screening recommendations/referrals: Colonoscopy: Done 03/05/18 Recommended yearly ophthalmology/optometry visit for glaucoma screening and checkup Recommended yearly dental visit for hygiene and checkup  Vaccinations: Influenza vaccine: Up to date/ Due 12/31/20 Pneumococcal vaccine: Completed  Tdap vaccine: Up to date next due 09/13/23 Shingles vaccine: Shingrix discussed. Please contact your pharmacy for coverage information.    Covid-19: Declined per  pt preference   Advanced directives: Advance directive discussed with you today. Even though you declined this today please call our office should you change your mind and we can give you the proper paperwork for you to fill out.  Conditions/risks identified: None at this time   Next appointment: Follow up in one year for your annual wellness visit.   Preventive Care 59 Years and Older, Male Preventive care refers to lifestyle choices and visits with your health care provider that can promote health and wellness. What does preventive care include? A yearly physical exam. This is also called an annual well check. Dental exams once or twice a year. Routine eye exams. Ask your health care provider how often you should have your eyes checked. Personal lifestyle choices, including: Daily care of your teeth and gums. Regular physical activity. Eating a healthy diet. Avoiding tobacco and drug use. Limiting alcohol use. Practicing safe sex. Taking low doses of aspirin every day. Taking vitamin and mineral supplements as recommended by your health care provider. What happens during an annual well check? The services and screenings done by your health care provider during your annual well check will depend  on your age, overall health, lifestyle risk factors, and family history of disease. Counseling  Your health care provider may ask you questions about your: Alcohol use. Tobacco use. Drug use. Emotional well-being. Home and relationship well-being. Sexual activity. Eating habits. History of falls. Memory and ability to understand (cognition). Work and work Statistician. Screening  You may have the following tests or measurements: Height, weight, and BMI. Blood pressure. Lipid and cholesterol levels. These may be checked every 5 years, or more frequently if you are over 60 years old. Skin check. Lung cancer screening. You may have this screening every year starting at age 8 if you have a 30-pack-year history of smoking and currently smoke or have quit within the past 15 years. Fecal occult blood test (FOBT) of the stool. You may have this test every year starting at age 79. Flexible sigmoidoscopy or colonoscopy. You may have a sigmoidoscopy every 5 years or a colonoscopy every 10 years starting at age 18. Prostate cancer screening. Recommendations will vary depending on your family history and other risks. Hepatitis C blood test. Hepatitis B blood test. Sexually transmitted disease (STD) testing. Diabetes screening. This is done by checking your blood sugar (glucose) after you have not eaten for a while (fasting). You may have this done every 1-3 years. Abdominal aortic aneurysm (AAA) screening. You may need this if you are a current or former smoker. Osteoporosis. You may be screened starting at age 77 if you are at high risk. Talk with your health care provider about your test results, treatment options, and if necessary, the need for more tests. Vaccines  Your health care provider may recommend certain vaccines, such as: Influenza vaccine. This is recommended every  year. Tetanus, diphtheria, and acellular pertussis (Tdap, Td) vaccine. You may need a Td booster every 10  years. Zoster vaccine. You may need this after age 71. Pneumococcal 13-valent conjugate (PCV13) vaccine. One dose is recommended after age 40. Pneumococcal polysaccharide (PPSV23) vaccine. One dose is recommended after age 3. Talk to your health care provider about which screenings and vaccines you need and how often you need them. This information is not intended to replace advice given to you by your health care provider. Make sure you discuss any questions you have with your health care provider. Document Released: 06/15/2015 Document Revised: 02/06/2016 Document Reviewed: 03/20/2015 Elsevier Interactive Patient Education  2017 [place] Prevention in the Home Falls can cause injuries. They can happen to people of all ages. There are many things you can do to make your home safe and to help prevent falls. What can I do on the outside of my home? Regularly fix the edges of walkways and driveways and fix any cracks. Remove anything that might make you trip as you walk through a door, such as a raised step or threshold. Trim any bushes or trees on the path to your home. Use bright outdoor lighting. Clear any walking paths of anything that might make someone trip, such as rocks or tools. Regularly check to see if handrails are loose or broken. Make sure that both sides of any steps have handrails. Any raised decks and porches should have guardrails on the edges. Have any leaves, snow, or ice cleared regularly. Use sand or salt on walking paths during winter. Clean up any spills in your garage right away. This includes oil or grease spills. What can I do in the bathroom? Use night lights. Install grab bars by the toilet and in the tub and shower. Do not use towel bars as grab bars. Use non-skid mats or decals in the tub or shower. If you need to sit down in the shower, use a plastic, non-slip stool. Keep the floor dry. Clean up any water that spills on the floor as soon as it  happens. Remove soap buildup in the tub or shower regularly. Attach bath mats securely with double-sided non-slip rug tape. Do not have throw rugs and other things on the floor that can make you trip. What can I do in the bedroom? Use night lights. Make sure that you have a light by your bed that is easy to reach. Do not use any sheets or blankets that are too big for your bed. They should not hang down onto the floor. Have a firm chair that has side arms. You can use this for support while you get dressed. Do not have throw rugs and other things on the floor that can make you trip. What can I do in the kitchen? Clean up any spills right away. Avoid walking on wet floors. Keep items that you use a lot in easy-to-reach places. If you need to reach something above you, use a strong step stool that has a grab bar. Keep electrical cords out of the way. Do not use floor polish or wax that makes floors slippery. If you must use wax, use non-skid floor wax. Do not have throw rugs and other things on the floor that can make you trip. What can I do with my stairs? Do not leave any items on the stairs. Make sure that there are handrails on both sides of the stairs and use them. Fix handrails that are broken or  loose. Make sure that handrails are as long as the stairways. Check any carpeting to make sure that it is firmly attached to the stairs. Fix any carpet that is loose or worn. Avoid having throw rugs at the top or bottom of the stairs. If you do have throw rugs, attach them to the floor with carpet tape. Make sure that you have a light switch at the top of the stairs and the bottom of the stairs. If you do not have them, ask someone to add them for you. What else can I do to help prevent falls? Wear shoes that: Do not have high heels. Have rubber bottoms. Are comfortable and fit you well. Are closed at the toe. Do not wear sandals. If you use a stepladder: Make sure that it is fully opened.  Do not climb a closed stepladder. Make sure that both sides of the stepladder are locked into place. Ask someone to hold it for you, if possible. Clearly mark and make sure that you can see: Any grab bars or handrails. First and last steps. Where the edge of each step is. Use tools that help you move around (mobility aids) if they are needed. These include: Canes. Walkers. Scooters. Crutches. Turn on the lights when you go into a dark area. Replace any light bulbs as soon as they burn out. Set up your furniture so you have a clear path. Avoid moving your furniture around. If any of your floors are uneven, fix them. If there are any pets around you, be aware of where they are. Review your medicines with your doctor. Some medicines can make you feel dizzy. This can increase your chance of falling. Ask your doctor what other things that you can do to help prevent falls. This information is not intended to replace advice given to you by your health care provider. Make sure you discuss any questions you have with your health care provider. Document Released: 03/15/2009 Document Revised: 10/25/2015 Document Reviewed: 06/23/2014 Elsevier Interactive Patient Education  2017 [employer].

## 2020-11-09 NOTE — Progress Notes (Signed)
Virtual Visit via Telephone Note  I connected with  Magnus Sinning on 11/09/20 at 11:45 AM EDT by telephone and verified that I am speaking with the correct person using two identifiers.  Medicare Annual Wellness visit completed telephonically due to Covid-19 pandemic.   Persons participating in this call: This Health Coach and this patient.   Location: Patient: Home Provider: Office   I discussed the limitations, risks, security and privacy concerns of performing an evaluation and management service by telephone and the availability of in person appointments. The patient expressed understanding and agreed to proceed.  Unable to perform video visit due to video visit attempted and failed and/or patient does not have video capability.   Some vital signs may be absent or patient reported.   Willette Brace, LPN   Subjective:   TRIPP GOINS is a 75 y.o. male who presents for Medicare Annual/Subsequent preventive examination.  Review of Systems     Cardiac Risk Factors include: advanced age (>43men, >33 women);dyslipidemia;male gender;obesity (BMI >30kg/m2)     Objective:    There were no vitals filed for this visit. There is no height or weight on file to calculate BMI.  Advanced Directives 11/09/2020 10/18/2020 07/16/2020 07/16/2020 09/15/2019 09/15/2019 09/14/2019  Does Patient Have a Medical Advance Directive? No No No No No - No  Would patient like information on creating a medical advance directive? No - Patient declined No - Patient declined No - Patient declined - No - Patient declined No - Patient declined -  Pre-existing out of facility DNR order (yellow form or pink MOST form) - - - - - - -    Current Medications (verified) Outpatient Encounter Medications as of 11/09/2020  Medication Sig   co-enzyme Q-10 50 MG capsule Take 200 mg by mouth daily.   Multiple Vitamins-Minerals (MULTIVITAMIN WITH MINERALS) tablet Take 1 tablet by mouth daily.   Multiple  Vitamins-Minerals (ZINC PO) Take 1 tablet by mouth 1 day or 1 dose.   omeprazole (PRILOSEC) 20 MG capsule TAKE 1 CAPSULE EVERY DAY   rosuvastatin (CRESTOR) 10 MG tablet TAKE 1 TABLET EVERY DAY   warfarin (COUMADIN) 5 MG tablet Take 1 tablet by mouth daily or take as directed by anticoagulation clinic   loratadine (CLARITIN) 10 MG tablet Take 10 mg by mouth daily as needed for allergies. (Patient not taking: Reported on 11/09/2020)   [DISCONTINUED] Ascorbic Acid (VITAMIN C) 1000 MG tablet Take 1,000 mg by mouth daily. (Patient not taking: No sig reported)   [DISCONTINUED] ferrous sulfate 325 (65 FE) MG tablet Take 325 mg by mouth daily with breakfast. (Patient not taking: No sig reported)   [DISCONTINUED] Multiple Vitamins-Minerals (VITAMIN D3 COMPLETE) TABS Take 1 tablet by mouth. (Patient not taking: Reported on 11/09/2020)   No facility-administered encounter medications on file as of 11/09/2020.    Allergies (verified) Patient has no known allergies.   History: Past Medical History:  Diagnosis Date   Allergy    Asbestos exposure    Basal cell carcinoma    skin of left ear   BPH (benign prostatic hyperplasia)    Cancer (HCC)    basal cell CA (skin)   Colitis 01/2011   Colon polyp    Deviated nasal septum    hx. 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 Prostate cancer after bx.   Ulcerative colitis [facility])    Past Surgical History:  Procedure Laterality Date   COLONOSCOPY     CYSTOSCOPY  02/16/2012   Procedure: CYSTOSCOPY FLEXIBLE;  Surgeon: Bernestine Amass, MD;  Location[facility];  Service: Urology;  Laterality: N/A;  FLEX CYSTO WITH REMOVAL  OF STAPLES    INGUINAL HERNIA REPAIR  1990's   double   KNEE SURGERY  2007   right knee scope   LAPAROSCOPIC APPENDECTOMY N/A 09/15/2019   Procedure: APPENDECTOMY LAPAROSCOPIC;  Surgeon: Greer Pickerel, MD;  Location: [facility];  Service: General;  Laterality: N/A;   lipoma removals  1990's   multiple and multiple remains now   LYMPH NODE DISSECTION  12/31/2011   Procedure: LYMPH NODE DISSECTION;  Surgeon: Bernestine Amass, MD;  Location: [facility];  Service: Urology;  Laterality: Bilateral;  Bilateral   POLYPECTOMY     ROBOT ASSISTED LAPAROSCOPIC RADICAL PROSTATECTOMY  12/31/2011   Procedure: ROBOTIC ASSISTED LAPAROSCOPIC RADICAL PROSTATECTOMY;  Surgeon: Bernestine Amass, MD;  Location: [facility];  Service: Urology;  Laterality: N/A;       TONSILLECTOMY     Family History  Problem Relation Age of Onset   Colon cancer Mother 4   Colon cancer Father 79   Prostate cancer Paternal Grandfather    Esophageal cancer Paternal Grandfather    Stomach cancer Neg Hx    Rectal cancer Neg Hx    Social History   Socioeconomic History   Marital status: Married    Spouse name: Not on file   Number of children: Not on file   Years of education: Not on file   Highest education level: Not on file  Occupational History   Occupation: Retired    Fish farm manager: RETIRED  Tobacco Use   Smoking status: Never   Smokeless tobacco: Never   Tobacco comments:    never used tobacco  Vaping Use   Vaping Use: Never used  Substance and Sexual Activity   Alcohol use: Yes    Comment: occasional    Drug use: No   Sexual activity: Not on file  Other Topics Concern   Not on file  Social History Narrative   Married, 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       Hobbies: travels fair amount has timeshare and also travels more than that, enjoys Haematologist, home repair      Physician roster   Delfin Edis - gastroenterology in past   Burney Gauze - hematology (released  around 2016)   Dr. Louis Meckel (prior [place]) - urology   Dr. Herbert Deaner - Ophthalmology   Dr. [place] Crane- dermatology   Social Determinants of Health   Financial Resource Strain: Low Risk    Difficulty of Paying Living Expenses: Not hard at all  Food Insecurity: No Food Insecurity   Worried About [place] in the Last Year: Never true   [place] in the Last Year: Never true  Transportation Needs: No Transportation Needs   Lack of Transportation (Medical): No   Lack of Transportation (Non-Medical): No  Physical Activity: Sufficiently Active   Days of Exercise per Week: 7 days   Minutes of Exercise per Session: 40  min  Stress: No Stress Concern Present   Feeling of Stress : Not at all  Social Connections: Moderately Integrated   Frequency of Communication with Friends and Family: More than three times a week   Frequency of Social Gatherings with Friends and Family: More than three times a week   Attends Religious Services: More than 4 times per year   Active Member of [employer] or Organizations: No   Attends Music therapist: Never   Marital Status: Married    Tobacco Counseling Counseling given: Not Answered Tobacco comments: never used tobacco   Clinical Intake:  Pre-visit preparation completed: Yes  Pain : No/denies pain     BMI - recorded: 34.6 Nutritional Status: BMI > 30  Obese Nutritional Risks: None Diabetes: No  How often do you need to have someone help you when you read instructions, pamphlets, or other written materials from your doctor or pharmacy?: 1 - Never  Diabetic?No  Interpreter Needed?: No  Information entered by :: Charlott Rakes, LPN   Activities of Daily Living In your present state of health, do you have any difficulty performing the following activities: 11/09/2020 07/16/2020  Hearing? N N  Vision? N N  Difficulty concentrating or making decisions? N N  Walking or climbing stairs? Y Y  Comment with history of knee -   Dressing or bathing? N N  Doing errands, shopping? N N  Preparing Food and eating ? N -  Using the Toilet? N -  In the past six months, have you accidently leaked urine? Y -  Comment wears a pad -  Do you have problems with loss of bowel control? N -  Managing your Medications? N -  Managing your Finances? N -  Housekeeping or managing your Housekeeping? N -  Some recent data might be hidden    Patient Care Team: Marin Olp, MD as PCP - General (Family Medicine) Ardis Hughs, MD as Consulting Physician (Urology)  Indicate any recent Medical Services you may have received from other than [facility] providers in the past year (date may be approximate).     Assessment:   This is a routine wellness examination for [place].  Hearing/Vision screen Hearing Screening - Comments:: Pt denies any hearing issues  Vision Screening - Comments:: Pt follows up with annual eye exams with Dr  Prudencio Burly   Dietary issues and exercise activities discussed: Current Exercise Habits: Home exercise routine, Type of exercise: Other - see comments (cardio), Time (Minutes): 35, Frequency (Times/Week): 7, Weekly Exercise (Minutes/Week): 245   Goals Addressed             This Visit's Progress    Patient Stated       None at this time         Depression Screen PHQ 2/9 Scores 11/09/2020 08/31/2020 07/25/2020 08/29/2019 02/23/2018 05/08/2017 11/04/2016  PHQ - 2 Score 0 0 0 0 0 0 0  PHQ- 9 Score - - - 0 - 0 -    Fall Risk Fall Risk  11/09/2020 08/31/2020 07/25/2020 08/29/2019 02/23/2018  Falls in the past year? 0 0 0 0 No  Number falls in past yr: 0 0 0 0 -  Injury with Fall? 0 0 0 0 -  Risk for fall due to : Impaired vision;Impaired mobility - - - -  Follow up Falls prevention discussed - - - -    FALL RISK PREVENTION PERTAINING TO THE HOME:  Any stairs in or around the home? No  If so, are there any without handrails? No  Home free of loose throw rugs in walkways, pet beds, electrical cords,  etc? Yes  Adequate lighting in your home to reduce risk of falls? Yes   ASSISTIVE DEVICES UTILIZED TO PREVENT FALLS:  Life alert? No  Use of a cane, walker or w/c? No  Grab bars in the bathroom? Yes  Shower chair or bench in shower? Yes  Elevated toilet seat or a handicapped toilet? Yes   TIMED UP AND GO:  Was the test performed? No      Cognitive Function:     6CIT Screen 11/09/2020  What Year? 0 points  What month? 0 points  What time? 0 points  Count back from 20 0 points  Months in reverse 0 points  Repeat phrase 0 points  Total Score 0    Immunizations Immunization History  Administered Date(s) Administered   Fluad Quad(high Dose 65+) 02/29/2020   Influenza Split 03/19/2011, 03/26/2012   Influenza Whole 03/15/2008, 02/22/2009, 02/25/2010   Influenza, High Dose Seasonal PF 02/28/2015, 04/09/2016, 03/11/2017, 05/19/2018   Influenza,inj,Quad PF,6+ Mos 03/18/2013, 03/07/2014   Influenza,inj,quad, With Preservative 03/16/2020   Pneumococcal Conjugate-13 04/30/2015   Pneumococcal Polysaccharide-23 09/12/2013   Pneumococcal-Unspecified 03/16/2020   Td 11/01/2003, 09/12/2013   Zoster, Live 05/14/2011    TDAP status: Up to date  Flu Vaccine status: Up to date  Pneumococcal vaccine status: Up to date  Covid-19 vaccine status: Declined, Education has been provided regarding the importance of this vaccine but patient still declined. Advised may receive this vaccine at local pharmacy or Health Dept.or vaccine clinic. Aware to provide a copy of the vaccination record if obtained from local pharmacy or Health Dept. Verbalized acceptance and understanding.   Qualifies for Shingles Vaccine? Yes   Zostavax completed Yes   Shingrix Completed?: No.    Education has been provided regarding the importance of this vaccine. Patient has been advised to call insurance company to determine out of pocket expense if they have not yet received this vaccine. Advised may also receive  vaccine at local pharmacy or Health Dept. Verbalized acceptance and understanding.  Screening Tests Health Maintenance  Topic Date Due   Zoster Vaccines- Shingrix (1 of 2) Never done   INFLUENZA VACCINE  12/31/2020   COLONOSCOPY (Pts 45-51yrs Insurance coverage will need to be confirmed)  03/06/2023   TETANUS/TDAP  09/13/2023   Hepatitis C Screening  Completed   PNA vac Low Risk Adult  Completed   HPV VACCINES  Aged Out   COVID-19 Vaccine  Discontinued    Health Maintenance  Health Maintenance Due  Topic Date Due   Zoster Vaccines- Shingrix (1 of 2) Never done    Colorectal cancer screening: Type of screening: Colonoscopy. Completed 03/05/18. Repeat every 5 years   Additional Screening:  Hepatitis C Screening: Completed 11/04/16  Vision Screening: Recommended annual ophthalmology exams for early detection of glaucoma and other disorders of the eye. Is the patient up to date with their annual eye exam?  Yes  Who is the provider or what is the name of the office in which the patient attends annual eye exams? Dr Prudencio Burly If pt is not established with a provider, would they like to be referred to a provider to establish care? No .   Dental Screening: Recommended annual dental exams for proper oral hygiene  Community Resource Referral / Chronic Care Management: CRR required this visit?  No   CCM required this visit?  No  Plan:     I have personally reviewed and noted the following in the patient's chart:   Medical and social history Use of alcohol, tobacco or illicit drugs  Current medications and supplements including opioid prescriptions. Patient is not currently taking opioid prescriptions. Functional ability and status Nutritional status Physical activity Advanced directives List of other physicians Hospitalizations, surgeries, and ER visits in previous 12 months Vitals Screenings to include cognitive, depression, and falls Referrals and appointments  In  addition, I have reviewed and discussed with patient certain preventive protocols, quality metrics, and best practice recommendations. A written personalized care plan for preventive services as well as general preventive health recommendations were provided to patient.     Willette Brace, LPN   1/76/1607   Nurse Notes: None

## 2020-11-13 ENCOUNTER — Ambulatory Visit (INDEPENDENT_AMBULATORY_CARE_PROVIDER_SITE_OTHER): Payer: Medicare HMO | Admitting: General Practice

## 2020-11-13 ENCOUNTER — Other Ambulatory Visit: Payer: Self-pay

## 2020-11-13 DIAGNOSIS — D6859 Other primary thrombophilia: Secondary | ICD-10-CM

## 2020-11-13 DIAGNOSIS — Z7901 Long term (current) use of anticoagulants: Secondary | ICD-10-CM | POA: Diagnosis not present

## 2020-11-13 LAB — POCT INR: INR: 3.2 — AB (ref 2.0–3.0)

## 2020-11-13 NOTE — Progress Notes (Signed)
[facility] NOTE  Patient Care Team: Marin Olp, MD as PCP - General (Family Medicine) Ardis Hughs, MD as Consulting Physician (Urology)  CHIEF COMPLAINTS/PURPOSE OF CONSULTATION:  PE, anticoagulation recommendations, lupus anticoagulant.  ASSESSMENT & PLAN:   This is a very pleasant 75 year old male patient with a past medical history significant for SVT many years ago, PE back in 2013 and most recently saddle PE in 2022 and previously thought to have lupus anticoagulant now on Eliquis referred to hematology for anticoagulation recommendations.  During his last visit, I have recommended transition to coumadin since DOAC's are inferior to coumadin in APLS He was able to successfully transition to Coumadin with his primary care physician, last INR was 3.2.  His Coumadin dose is currently being titrated.  He does not have any symptoms concerning for progression of the PE or DVT.  I recommended that he continue Coumadin for anticoagulation and we will repeat the lupus anticoagulant testing per patient's request.  This was abnormal at least on 2 occasions in the past .  With regards to his mild elevation of PSA, he is very nervous about possible prostate cancer recurrence and wanted [place] to repeat his PSA today.  This has been ordered according to patient's request.  He will continue to follow-up with his urologist for further recommendations. He was informed of the symptoms and signs to watch for in case of new PE/DVT or worsening of the current PE.  He was instructed to go to the nearest hospital with any worsening chest pain, shortness of breath, asymmetrical lower extremity swelling.  He expressed understanding.  He can return to clinic with [place] in 1 year.  HISTORY OF PRESENTING ILLNESS:  Nicholas Owen 75 y.o. male is here because of history of PE and lupus anticoagulant.  Patient is a decent historian.  He remembers that in the past, he may have had a  blood clot cannot remember superficial versus deep after his right knee replacement.  Then in 2013 he was diagnosed with bilateral pulmonary embolism and was started on anticoagulation with Xarelto which he took for about 2 years until November 2015.  He followed up with Dr. Marin Olp during that time and was thought to have lupus anticoagulant.  Lupus anticoagulant testing was done in 2014 and in January 2016.  He may have been on the blood thinner in 2014 but he was clearly off of anticoagulation for at least 2 to 3 months before he was retested in January 2016 based on my review of records from Dr. Marin Olp and Ms. Elkton who is Dr. Antonieta Pert physician assistant.  Most recently in February 2022 he presented with shortness of breath while walking to the car which was sudden onset.  He was only on full dose aspirin at the time of presentation.  He was found to have large saddle PE with right heart strain consistent with at least submassive PE, started on heparin drip and transition to Eliquis.  He is now referred to hematology for anticoagulation recommendations since there was concern in the past that he had lupus anticoagulant  and if he needs to be transition to warfarin.  He has past medical history significant for prostate cancer status post robot-assisted laparoscopic radical prostatectomy in July 2013 with lymph node dissection.  Interval history  Mr. Ertl is here for a follow-up by himself.  Since his last visit, he has been on Coumadin for at least 2 to 3 weeks now and his last INR  was 3.2.  He is PCP is assisting with Coumadin management.  He denies any worsening shortness of breath, going to the gym every day and is able to do a bit more activity.  Right lower extremity is swollen the left lower extremity at baseline, no changes.  No bleeding complaints with Coumadin.  He is understandably worried about having to do the INR so many times.  Also his PSA showed a mild spike hence he is worried  about prostate cancer recurrence.  No other complaints.  Rest of the pertinent 10 point ROS reviewed and negative   MEDICAL HISTORY:  Past Medical History:  Diagnosis Date   Allergy    Asbestos exposure    Basal cell carcinoma    skin of left ear   BPH (benign prostatic hyperplasia)    Cancer ([place])    basal cell CA (skin)   Colitis 01/2011   Colon polyp    Deviated nasal septum    hx. of   Diverticulosis    DVT (deep venous thrombosis) (HCC)    GERD (gastroesophageal reflux disease)    Hyperlipidemia    Kidney stones    Lipoma 12-24-11   multiple(present- lipoma 2-lt./4- rt arms/ back/ chest   Liver cyst 10/11   4 simple cysts in liver   Prostate cancer ([place]) 10/06/11   Gleason 3+4=7, vol 39 cc, PSA 5.15   Prostate cancer ([place]) 12-24-11   dx. Prostate cancer after bx.   Ulcerative colitis ([place])     SURGICAL HISTORY: Past Surgical History:  Procedure Laterality Date   COLONOSCOPY     CYSTOSCOPY  02/16/2012   Procedure: CYSTOSCOPY FLEXIBLE;  Surgeon: Bernestine Amass, MD;  Location[facility];  Service: Urology;  Laterality: N/A;  FLEX CYSTO WITH REMOVAL OF STAPLES    INGUINAL HERNIA REPAIR  1990's   double   KNEE SURGERY  2007   right knee scope   LAPAROSCOPIC APPENDECTOMY N/A 09/15/2019   Procedure: APPENDECTOMY LAPAROSCOPIC;  Surgeon: Greer Pickerel, MD;  Location: [facility];  Service: General;  Laterality: N/A;   lipoma removals  1990's   multiple and multiple remains now   LYMPH NODE DISSECTION  12/31/2011   Procedure: LYMPH NODE DISSECTION;  Surgeon: Bernestine Amass, MD;  Location: [facility];  Service: Urology;  Laterality: Bilateral;  Bilateral   POLYPECTOMY     ROBOT ASSISTED LAPAROSCOPIC RADICAL PROSTATECTOMY  12/31/2011   Procedure: ROBOTIC ASSISTED LAPAROSCOPIC RADICAL PROSTATECTOMY;  Surgeon: Bernestine Amass, MD;  Location: [facility];  Service: Urology;  Laterality: N/A;       TONSILLECTOMY      SOCIAL HISTORY: Social History   Socioeconomic History    Marital status: Married    Spouse name: Not on file   Number of children: Not on file   Years of education: Not on file   Highest education level: Not on file  Occupational History   Occupation: Retired    Fish farm manager: RETIRED  Tobacco Use   Smoking status: Never   Smokeless tobacco: Never   Tobacco comments:    never used tobacco  Vaping Use   Vaping Use: Never used  Substance and Sexual Activity   Alcohol use: Yes    Comment: occasional    Drug use: No   Sexual activity: Not on file  Other Topics Concern   Not on file  Social History Narrative   Married, 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  Hobbies: travels fair amount has timeshare and also travels more than that, enjoys Haematologist, home repair      Physician roster   Delfin Edis - gastroenterology in past   Burney Gauze - hematology (released around 2016)   Dr. Louis Meckel (prior [place]) - urology   Dr. Herbert Deaner - Ophthalmology   Dr. [place] Crane- dermatology   Social Determinants of Health   Financial Resource Strain: Low Risk    Difficulty of Paying Living Expenses: Not hard at all  Food Insecurity: No Food Insecurity   Worried About [place] in the Last Year: Never true   [place] in the Last Year: Never true  Transportation Needs: No Transportation Needs   Lack of Transportation (Medical): No   Lack of Transportation (Non-Medical): No  Physical Activity: Sufficiently Active   Days of Exercise per Week: 7 days   Minutes of Exercise per Session: 40 min  Stress: No Stress Concern Present   Feeling of Stress : Not at all  Social Connections: Moderately Integrated   Frequency of Communication with Friends and Family: More than three times a week   Frequency of Social Gatherings with Friends and Family: More than three times a week   Attends Religious Services: More than 4 times per year   Active Member of [employer] or Organizations: No   Attends Programme researcher, broadcasting/film/video: Never   Marital Status: Married  Human resources officer Violence: Not At Risk   Fear of Current or Ex-Partner: No   Emotionally Abused: No   Physically Abused: No   Sexually Abused: No    FAMILY HISTORY: Family History  Problem Relation Age of Onset   Colon cancer Mother 70   Colon cancer Father 32   Prostate cancer Paternal Grandfather    Esophageal cancer Paternal Grandfather    Stomach cancer Neg Hx    Rectal cancer Neg Hx     ALLERGIES:  has No Known Allergies.  MEDICATIONS:  Current Outpatient Medications  Medication Sig Dispense Refill   co-enzyme Q-10 50 MG capsule Take 200 mg by mouth daily.     Multiple Vitamins-Minerals (MULTIVITAMIN WITH MINERALS) tablet Take 1 tablet by mouth daily.     Multiple Vitamins-Minerals (ZINC PO) Take 1 tablet by mouth 1 day or 1 dose.     omeprazole (PRILOSEC) 20 MG capsule TAKE 1 CAPSULE EVERY DAY 90 capsule 1   rosuvastatin (CRESTOR) 10 MG tablet TAKE 1 TABLET EVERY DAY 90 tablet 3   warfarin (COUMADIN) 5 MG tablet Take 1 tablet by mouth daily or take as directed by anticoagulation clinic 30 tablet 1   loratadine (CLARITIN) 10 MG tablet Take 10 mg by mouth daily as needed for allergies. (Patient not taking: Reported on 11/09/2020)     No current facility-administered medications for this visit.    PHYSICAL EXAMINATION: ECOG PERFORMANCE STATUS: 0 - Asymptomatic  Vitals:   11/14/20 1114  BP: 127/85  Pulse: 66  Resp: 18  Temp: (!) 97.5 F (36.4 C)  SpO2: 97%    Filed Weights   11/14/20 1114  Weight: 264 lb 6.4 oz (119.9 kg)   GENERAL:alert, no distress and comfortable SKIN: skin color, texture, turgor are normal, no rashes or significant lesions EYES: normal, conjunctiva are pink and non-injected, sclera clear OROPHARYNX:no exudate, no erythema and lips, buccal mucosa, and tongue normal  NECK: supple, thyroid normal size, non-tender, without nodularity LYMPH:  no palpable lymphadenopathy in the cervical,  axillary or inguinal LUNGS: clear to auscultation  and percussion with normal breathing effort HEART: regular rate & rhythm and no murmurs and no lower extremity edema ABDOMEN:abdomen soft, non-tender and normal bowel sounds Musculoskeletal:no cyanosis of digits and no clubbing  PSYCH: alert & oriented x 3 with fluent speech NEURO: no focal motor/sensory deficits  LABORATORY DATA:  I have reviewed the data as listed Lab Results  Component Value Date   WBC 8.0 08/31/2020   HGB 15.2 08/31/2020   HCT 44.9 08/31/2020   MCV 84.1 08/31/2020   PLT 356.0 08/31/2020     Chemistry      Component Value Date/Time   NA 138 08/31/2020 1125   NA 139 06/16/2014 1206   K 4.2 08/31/2020 1125   K 4.4 06/16/2014 1206   CL 102 08/31/2020 1125   CO2 26 08/31/2020 1125   CO2 24 06/16/2014 1206   BUN 17 08/31/2020 1125   BUN 17.2 06/16/2014 1206   CREATININE 0.95 08/31/2020 1125   CREATININE 0.98 02/29/2020 1140   CREATININE 0.9 06/16/2014 1206      Component Value Date/Time   CALCIUM 9.5 08/31/2020 1125   CALCIUM 8.9 06/16/2014 1206   ALKPHOS 79 08/31/2020 1125   ALKPHOS 74 06/16/2014 1206   AST 20 08/31/2020 1125   AST 19 06/16/2014 1206   ALT 36 08/31/2020 1125   ALT 22 06/16/2014 1206   BILITOT 1.1 08/31/2020 1125   BILITOT 0.92 06/16/2014 1206      Have reviewed pertinent records.  Labs from today are pending.  RADIOGRAPHIC STUDIES: I have personally reviewed the radiological images as listed and agreed with the findings in the report. No results found.  All questions were answered. The patient knows to call the clinic with any problems, questions or concerns.   Benay Pike, MD 11/14/2020 12:04 PM

## 2020-11-13 NOTE — Progress Notes (Signed)
Medical screening examination/treatment/procedure(s) were performed by non-physician practitioner and as supervising physician I was immediately available for consultation/collaboration. I agree with above. Luian Schumpert, MD   

## 2020-11-13 NOTE — Patient Instructions (Signed)
Pre visit review using our clinic review tool, if applicable. No additional management support is needed unless otherwise documented below in the visit note.  Take 1/2 tablets (2.5 mg) today and then take 1 tablet daily until checked next Tuesday the 21 st at the [facility] office ([address], [address]nd floor).

## 2020-11-14 ENCOUNTER — Telehealth: Payer: Self-pay | Admitting: Hematology and Oncology

## 2020-11-14 ENCOUNTER — Other Ambulatory Visit: Payer: Self-pay

## 2020-11-14 ENCOUNTER — Inpatient Hospital Stay: Payer: Medicare HMO | Attending: Hematology and Oncology

## 2020-11-14 ENCOUNTER — Inpatient Hospital Stay (HOSPITAL_BASED_OUTPATIENT_CLINIC_OR_DEPARTMENT_OTHER): Payer: Medicare HMO | Admitting: Hematology and Oncology

## 2020-11-14 ENCOUNTER — Encounter: Payer: Self-pay | Admitting: Hematology and Oncology

## 2020-11-14 VITALS — BP 127/85 | HR 66 | Temp 97.5°F | Resp 18 | Wt 264.4 lb

## 2020-11-14 DIAGNOSIS — Z7901 Long term (current) use of anticoagulants: Secondary | ICD-10-CM | POA: Diagnosis not present

## 2020-11-14 DIAGNOSIS — Z85828 Personal history of other malignant neoplasm of skin: Secondary | ICD-10-CM | POA: Insufficient documentation

## 2020-11-14 DIAGNOSIS — D6861 Antiphospholipid syndrome: Secondary | ICD-10-CM | POA: Diagnosis not present

## 2020-11-14 DIAGNOSIS — E785 Hyperlipidemia, unspecified: Secondary | ICD-10-CM | POA: Insufficient documentation

## 2020-11-14 DIAGNOSIS — Z79899 Other long term (current) drug therapy: Secondary | ICD-10-CM | POA: Insufficient documentation

## 2020-11-14 DIAGNOSIS — D6862 Lupus anticoagulant syndrome: Secondary | ICD-10-CM

## 2020-11-14 DIAGNOSIS — K219 Gastro-esophageal reflux disease without esophagitis: Secondary | ICD-10-CM | POA: Insufficient documentation

## 2020-11-14 DIAGNOSIS — Z8546 Personal history of malignant neoplasm of prostate: Secondary | ICD-10-CM | POA: Insufficient documentation

## 2020-11-14 DIAGNOSIS — Z86711 Personal history of pulmonary embolism: Secondary | ICD-10-CM | POA: Diagnosis not present

## 2020-11-14 DIAGNOSIS — Z86718 Personal history of other venous thrombosis and embolism: Secondary | ICD-10-CM | POA: Diagnosis not present

## 2020-11-14 DIAGNOSIS — Z9079 Acquired absence of other genital organ(s): Secondary | ICD-10-CM | POA: Insufficient documentation

## 2020-11-14 DIAGNOSIS — N400 Enlarged prostate without lower urinary tract symptoms: Secondary | ICD-10-CM | POA: Insufficient documentation

## 2020-11-14 NOTE — Telephone Encounter (Signed)
Scheduled per los. Gave avs and calendar  

## 2020-11-15 ENCOUNTER — Other Ambulatory Visit: Payer: Medicare HMO

## 2020-11-15 ENCOUNTER — Ambulatory Visit: Payer: Medicare HMO | Admitting: Hematology and Oncology

## 2020-11-15 LAB — CARDIOLIPIN ANTIBODIES, IGG, IGM, IGA
Anticardiolipin IgA: <9 APL U/mL (ref 0–11)
Anticardiolipin IgG: 10 GPL U/mL (ref 0–14)
Anticardiolipin IgM: <9 MPL U/mL (ref 0–12)

## 2020-11-15 LAB — PSA, TOTAL AND FREE
PSA, Free Pct: UNDETERMINED %
PSA, Free: <0.01 ng/mL
Prostate Specific Ag, Serum: <0.1 ng/mL (ref 0.0–4.0)

## 2020-11-16 LAB — BETA-2-GLYCOPROTEIN I ABS, IGG/M/A
Beta-2 Glyco I IgG: <9 GPI IgG units (ref 0–20)
Beta-2-Glycoprotein I IgA: <9 GPI IgA units (ref 0–25)
Beta-2-Glycoprotein I IgM: <9 GPI IgM units (ref 0–32)

## 2020-11-16 LAB — HEXAGONAL PHASE PHOSPHOLIPID: Hexagonal Phase Phospholipid: 22 s — ABNORMAL HIGH (ref 0–11)

## 2020-11-16 LAB — DRVVT CONFIRM: dRVVT Confirm: 1.2 ratio (ref 0.8–1.2)

## 2020-11-16 LAB — LUPUS ANTICOAGULANT PANEL
DRVVT: 58.7 s — ABNORMAL HIGH (ref 0.0–47.0)
PTT Lupus Anticoagulant: 76.7 s — ABNORMAL HIGH (ref 0.0–51.9)

## 2020-11-16 LAB — PTT-LA MIX: PTT-LA Mix: 65.6 s — ABNORMAL HIGH (ref 0.0–48.9)

## 2020-11-16 LAB — DRVVT MIX: dRVVT Mix: 50.2 s — ABNORMAL HIGH (ref 0.0–40.4)

## 2020-11-20 ENCOUNTER — Other Ambulatory Visit: Payer: Self-pay | Admitting: General Practice

## 2020-11-20 ENCOUNTER — Other Ambulatory Visit: Payer: Self-pay

## 2020-11-20 ENCOUNTER — Ambulatory Visit (INDEPENDENT_AMBULATORY_CARE_PROVIDER_SITE_OTHER): Payer: Medicare HMO | Admitting: General Practice

## 2020-11-20 DIAGNOSIS — D6859 Other primary thrombophilia: Secondary | ICD-10-CM

## 2020-11-20 DIAGNOSIS — Z7901 Long term (current) use of anticoagulants: Secondary | ICD-10-CM

## 2020-11-20 LAB — POCT INR: INR: 3 (ref 2.0–3.0)

## 2020-11-20 MED ORDER — WARFARIN SODIUM 5 MG PO TABS: ORAL_TABLET | ORAL | 0 refills | Status: DC

## 2020-11-20 MED ORDER — WARFARIN SODIUM 5 MG PO TABS: ORAL_TABLET | ORAL | 1 refills | Status: DC

## 2020-11-20 NOTE — Progress Notes (Signed)
Medical screening examination/treatment/procedure(s) were performed by non-physician practitioner and as supervising physician I was immediately available for consultation/collaboration. I agree with above. Tionne Dayhoff, MD   

## 2020-11-20 NOTE — Patient Instructions (Addendum)
Pre visit review using our clinic review tool, if applicable. No additional management support is needed unless otherwise documented below in the visit note.  Change overall dosage to 1 tablet daily  except 1/2 tablet on Wednesdays.

## 2020-11-27 ENCOUNTER — Other Ambulatory Visit: Payer: Self-pay

## 2020-11-27 ENCOUNTER — Ambulatory Visit (INDEPENDENT_AMBULATORY_CARE_PROVIDER_SITE_OTHER): Payer: Medicare HMO | Admitting: General Practice

## 2020-11-27 DIAGNOSIS — D6859 Other primary thrombophilia: Secondary | ICD-10-CM | POA: Diagnosis not present

## 2020-11-27 DIAGNOSIS — Z7901 Long term (current) use of anticoagulants: Secondary | ICD-10-CM

## 2020-11-27 LAB — POCT INR: INR: 2.9 (ref 2.0–3.0)

## 2020-11-27 NOTE — Patient Instructions (Signed)
Pre visit review using our clinic review tool, if applicable. No additional management support is needed unless otherwise documented below in the visit note.  Continue to take 1 tablet daily  except 1/2 tablet on Wednesdays.

## 2020-11-27 NOTE — Progress Notes (Signed)
Medical screening examination/treatment/procedure(s) were performed by non-physician practitioner and as supervising physician I was immediately available for consultation/collaboration. I agree with above. Ruble Pumphrey, MD   

## 2020-12-10 ENCOUNTER — Encounter: Payer: Self-pay | Admitting: Family Medicine

## 2020-12-11 ENCOUNTER — Other Ambulatory Visit: Payer: Self-pay

## 2020-12-11 ENCOUNTER — Ambulatory Visit (INDEPENDENT_AMBULATORY_CARE_PROVIDER_SITE_OTHER): Payer: Medicare HMO | Admitting: General Practice

## 2020-12-11 DIAGNOSIS — Z7901 Long term (current) use of anticoagulants: Secondary | ICD-10-CM

## 2020-12-11 DIAGNOSIS — D6859 Other primary thrombophilia: Secondary | ICD-10-CM

## 2020-12-11 LAB — POCT INR: INR: 2.8 (ref 2.0–3.0)

## 2020-12-11 NOTE — Patient Instructions (Signed)
Pre visit review using our clinic review tool, if applicable. No additional management support is needed unless otherwise documented below in the visit note.  Continue to take 1 tablet daily  except 1/2 tablet on Wednesdays.

## 2020-12-11 NOTE — Progress Notes (Signed)
Medical screening examination/treatment/procedure(s) were performed by non-physician practitioner and as supervising physician I was immediately available for consultation/collaboration. I agree with above. Gagandeep Kossman, MD   

## 2020-12-17 ENCOUNTER — Other Ambulatory Visit: Payer: Self-pay | Admitting: *Deleted

## 2020-12-17 ENCOUNTER — Other Ambulatory Visit: Payer: Self-pay | Admitting: Pulmonary Disease

## 2020-12-17 DIAGNOSIS — I2782 Chronic pulmonary embolism: Secondary | ICD-10-CM

## 2020-12-17 NOTE — Progress Notes (Signed)
Protocol before VQ Scan to have a chest xray prior to procedure.

## 2021-01-01 ENCOUNTER — Other Ambulatory Visit: Payer: Self-pay

## 2021-01-01 ENCOUNTER — Ambulatory Visit (INDEPENDENT_AMBULATORY_CARE_PROVIDER_SITE_OTHER): Payer: Medicare HMO | Admitting: General Practice

## 2021-01-01 DIAGNOSIS — D6859 Other primary thrombophilia: Secondary | ICD-10-CM | POA: Diagnosis not present

## 2021-01-01 DIAGNOSIS — Z7901 Long term (current) use of anticoagulants: Secondary | ICD-10-CM | POA: Diagnosis not present

## 2021-01-01 LAB — POCT INR: INR: 2.7 (ref 2.0–3.0)

## 2021-01-01 NOTE — Progress Notes (Signed)
Medical screening examination/treatment/procedure(s) were performed by non-physician practitioner and as supervising physician I was immediately available for consultation/collaboration. I agree with above. Stehanie Ekstrom, MD   

## 2021-01-01 NOTE — Patient Instructions (Addendum)
Pre visit review using our clinic review tool, if applicable. No additional management support is needed unless otherwise documented below in the visit note.  Continue to take 1 tablet daily  except 1/2 tablet on Wednesdays.  Re-check in 4 weeks.

## 2021-01-04 ENCOUNTER — Ambulatory Visit (HOSPITAL_COMMUNITY)
Admission: RE | Admit: 2021-01-04 | Discharge: 2021-01-04 | Disposition: A | Discharge status: Home / Self Care | Payer: Medicare HMO | Source: Ambulatory Visit | Attending: Pulmonary Disease | Admitting: Pulmonary Disease

## 2021-01-04 ENCOUNTER — Other Ambulatory Visit: Payer: Self-pay

## 2021-01-04 DIAGNOSIS — E785 Hyperlipidemia, unspecified: Secondary | ICD-10-CM | POA: Diagnosis not present

## 2021-01-04 DIAGNOSIS — I517 Cardiomegaly: Secondary | ICD-10-CM | POA: Insufficient documentation

## 2021-01-04 DIAGNOSIS — M329 Systemic lupus erythematosus, unspecified: Secondary | ICD-10-CM | POA: Diagnosis not present

## 2021-01-04 DIAGNOSIS — I2602 Saddle embolus of pulmonary artery with acute cor pulmonale: Secondary | ICD-10-CM | POA: Insufficient documentation

## 2021-01-04 DIAGNOSIS — E669 Obesity, unspecified: Secondary | ICD-10-CM | POA: Insufficient documentation

## 2021-01-04 DIAGNOSIS — R0600 Dyspnea, unspecified: Secondary | ICD-10-CM | POA: Insufficient documentation

## 2021-01-04 LAB — ECHOCARDIOGRAM COMPLETE
Area-P 1/2: 5.62 cm2
S' Lateral: 3.4 cm
Single Plane A4C EF: 57.3 %

## 2021-01-04 NOTE — Progress Notes (Signed)
  Echocardiogram 2D Echocardiogram has been performed.  Nicholas Owen 01/04/2021, 9:28 AM

## 2021-01-14 ENCOUNTER — Other Ambulatory Visit: Payer: Self-pay | Admitting: Pulmonary Disease

## 2021-01-14 ENCOUNTER — Other Ambulatory Visit: Payer: Self-pay

## 2021-01-14 ENCOUNTER — Ambulatory Visit (HOSPITAL_COMMUNITY)
Admission: RE | Admit: 2021-01-14 | Discharge: 2021-01-14 | Disposition: A | Discharge status: Home / Self Care | Payer: Medicare HMO | Source: Ambulatory Visit | Attending: Pulmonary Disease | Admitting: Pulmonary Disease

## 2021-01-14 DIAGNOSIS — I2782 Chronic pulmonary embolism: Secondary | ICD-10-CM

## 2021-01-14 DIAGNOSIS — D179 Benign lipomatous neoplasm, unspecified: Secondary | ICD-10-CM | POA: Diagnosis not present

## 2021-01-14 DIAGNOSIS — I2699 Other pulmonary embolism without acute cor pulmonale: Secondary | ICD-10-CM | POA: Diagnosis not present

## 2021-01-14 DIAGNOSIS — I2602 Saddle embolus of pulmonary artery with acute cor pulmonale: Secondary | ICD-10-CM | POA: Diagnosis not present

## 2021-01-14 DIAGNOSIS — K449 Diaphragmatic hernia without obstruction or gangrene: Secondary | ICD-10-CM | POA: Diagnosis not present

## 2021-01-14 MED ORDER — TECHNETIUM TO 99M ALBUMIN AGGREGATED
4.2000 | Freq: Once | INTRAVENOUS | Status: AC | PRN
  Administered 2021-01-14: 4.2 via INTRAVENOUS

## 2021-01-29 ENCOUNTER — Other Ambulatory Visit: Payer: Self-pay

## 2021-01-29 ENCOUNTER — Ambulatory Visit (INDEPENDENT_AMBULATORY_CARE_PROVIDER_SITE_OTHER): Payer: Medicare HMO

## 2021-01-29 DIAGNOSIS — Z7901 Long term (current) use of anticoagulants: Secondary | ICD-10-CM

## 2021-01-29 LAB — POCT INR: INR: 2.9 (ref 2.0–3.0)

## 2021-01-29 NOTE — Progress Notes (Signed)
Medical screening examination/treatment/procedure(s) were performed by non-physician practitioner and as supervising physician I was immediately available for consultation/collaboration. I agree with above. Reon Hunley, MD   

## 2021-01-29 NOTE — Patient Instructions (Addendum)
Pre visit review using our clinic review tool, if applicable. No additional management support is needed unless otherwise documented below in the visit note.  Continue to take 1 tablet daily  except 1/2 tablet on Wednesdays.  Re-check in 4 weeks.

## 2021-02-11 ENCOUNTER — Ambulatory Visit: Payer: Medicare HMO | Admitting: Pulmonary Disease

## 2021-02-11 ENCOUNTER — Encounter: Payer: Self-pay | Admitting: Pulmonary Disease

## 2021-02-11 ENCOUNTER — Other Ambulatory Visit: Payer: Self-pay

## 2021-02-11 VITALS — BP 128/70 | HR 70 | Ht 72.0 in | Wt 257.4 lb

## 2021-02-11 DIAGNOSIS — D6862 Lupus anticoagulant syndrome: Secondary | ICD-10-CM | POA: Diagnosis not present

## 2021-02-11 DIAGNOSIS — I2602 Saddle embolus of pulmonary artery with acute cor pulmonale: Secondary | ICD-10-CM

## 2021-02-11 DIAGNOSIS — R9721 Rising PSA following treatment for malignant neoplasm of prostate: Secondary | ICD-10-CM | POA: Diagnosis not present

## 2021-02-11 NOTE — Progress Notes (Signed)
Synopsis: Referred in May 2022 for hospital follow up of pulmonary embolism  Subjective:   PATIENT ID: Nicholas Owen GENDER: male DOB: 15-Jun-1945, MRN: [id number]    HPI  Chief Complaint  Patient presents with   Follow-up    4 mo f/u. States his breathing has returned back to a more normal pattern since May 2022. Denies seeing an additional swelling or pain his legs or arms.    Nicholas Owen is a 75 year old male, never smoker with history of lupus anticoagulant, pulmonary embolism in 2013 and admission 07/16/20 - 07/20/20 for saddle pulmonary embolus who returns to pulmonary clinic for follow up.   Patient was seen by Hematology 10/18/2020 and 11/14/20, notes were personally reviewed.  He was transition to Coumadin therapy from Eliquis and is undergoing monthly monitoring of his INR.  He denies any issues with shortness of breath or chest pain.  He has started going to the gym and is doing a mix of cardio and weightlifting exercises.  He denies any hemoptysis or gastrointestinal bleeding.  He does report excessive bleeding with cuts.  OV 09/2020 He is currently on eliquis '5mg'$  twice daily and feeling much better. His shortness of breath and chest pains are resolved. He lives a fairly sedentary life due to chronic right knee pain   He was previously followed by hematology for the DVT/PE in 2013 to 2016. He completed 2 years of xarelto in November 2015 and was transitioned to '325mg'$  aspirin daily. Lupus anticoagulant testing was performed on 10/01/2014 which was positived. He remained on '325mg'$  aspirin daily since his last visit with hematology in 2016.   Past Medical History:  Diagnosis Date   Allergy    Asbestos exposure    Basal cell carcinoma    skin of left ear   BPH (benign prostatic hyperplasia)    Cancer (HCC)    basal cell CA (skin)   Colitis 01/2011   Colon polyp    Deviated nasal septum    hx. of   Diverticulosis    DVT (deep venous thrombosis) (HCC)    GERD  (gastroesophageal reflux disease)    Hyperlipidemia    Kidney stones    Lipoma 12-24-11   multiple(present- lipoma 2-lt./4- rt arms/ back/ chest   Liver cyst 10/11   4 simple cysts in liver   Prostate cancer ([facility]) 10/06/11   Gleason 3+4=7, vol 39 cc, PSA 5.15   Prostate cancer ([place]) 12-24-11   dx. Prostate cancer after bx.   Ulcerative colitis ([place])      Family History  Problem Relation Age of Onset   Colon cancer Mother 20   Colon cancer Father 30   Prostate cancer Paternal Grandfather    Esophageal cancer Paternal Grandfather    Stomach cancer Neg Hx    Rectal cancer Neg Hx      Social History   Socioeconomic History   Marital status: Married    Spouse name: Not on file   Number of children: Not on file   Years of education: Not on file   Highest education level: Not on file  Occupational History   Occupation: Retired    Fish farm manager: RETIRED  Tobacco Use   Smoking status: Never   Smokeless tobacco: Never   Tobacco comments:    never used tobacco  Vaping Use   Vaping Use: Never used  Substance and Sexual Activity   Alcohol use: Yes    Comment: occasional    Drug use: No  Sexual activity: Not on file  Other Topics Concern   Not on file  Social History Narrative   Married, 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       Hobbies: travels fair amount has timeshare and also travels more than that, enjoys Haematologist, home repair      Physician roster   Delfin Edis - gastroenterology in past   Burney Gauze - hematology (released around 2016)   Dr. Louis Meckel (prior [place]) - urology   Dr. Herbert Deaner - Ophthalmology   Dr. [place] Crane- dermatology   Social Determinants of Health   Financial Resource Strain: Low Risk    Difficulty of Paying Living Expenses: Not hard at all  Food Insecurity: No Food Insecurity   Worried About [place] in the Last Year: Never true   [place] in the Last Year: Never true   Transportation Needs: No Transportation Needs   Lack of Transportation (Medical): No   Lack of Transportation (Non-Medical): No  Physical Activity: Sufficiently Active   Days of Exercise per Week: 7 days   Minutes of Exercise per Session: 40 min  Stress: No Stress Concern Present   Feeling of Stress : Not at all  Social Connections: Moderately Integrated   Frequency of Communication with Friends and Family: More than three times a week   Frequency of Social Gatherings with Friends and Family: More than three times a week   Attends Religious Services: More than 4 times per year   Active Member of [employer] or Organizations: No   Attends Archivist Meetings: Never   Marital Status: Married  Human resources officer Violence: Not At Risk   Fear of Current or Ex-Partner: No   Emotionally Abused: No   Physically Abused: No   Sexually Abused: No     No Known Allergies   Outpatient Medications Prior to Visit  Medication Sig Dispense Refill   co-enzyme Q-10 50 MG capsule Take 200 mg by mouth daily.     Multiple Vitamins-Minerals (MULTIVITAMIN WITH MINERALS) tablet Take 1 tablet by mouth daily.     Multiple Vitamins-Minerals (ZINC PO) Take 1 tablet by mouth 1 day or 1 dose.     omeprazole (PRILOSEC) 20 MG capsule TAKE 1 CAPSULE EVERY DAY 90 capsule 1   rosuvastatin (CRESTOR) 10 MG tablet TAKE 1 TABLET EVERY DAY 90 tablet 3   warfarin (COUMADIN) 5 MG tablet Take 1 tablet by mouth daily or take as directed by anticoagulation clinic 90 tablet 1   loratadine (CLARITIN) 10 MG tablet Take 10 mg by mouth daily as needed for allergies. (Patient not taking: Reported on 11/09/2020)     No facility-administered medications prior to visit.    Review of Systems  Constitutional:  Negative for chills, fever, malaise/fatigue and weight loss.  HENT:  Negative for congestion, sinus pain and sore throat.   Eyes: Negative.   Respiratory:  Negative for cough, hemoptysis, sputum production, shortness of  breath and wheezing.   Cardiovascular:  Negative for chest pain, palpitations, orthopnea, claudication and leg swelling.  Gastrointestinal:  Negative for abdominal pain, heartburn, nausea and vomiting.  Genitourinary: Negative.   Musculoskeletal:  Positive for joint pain. Negative for myalgias.  Skin:  Negative for rash.  Neurological:  Negative for weakness.  Endo/Heme/Allergies: Negative.   Psychiatric/Behavioral: Negative.      Objective:   Vitals:   02/11/21 1353  BP: 128/70  Pulse: 70  SpO2: 95%  Weight: 257  lb 6.4 oz (116.8 kg)  Height: 6' (1.829 m)    Physical Exam Constitutional:      General: He is not in acute distress.    Appearance: Normal appearance. He is obese.  HENT:     Head: Normocephalic and atraumatic.  Eyes:     Extraocular Movements: Extraocular movements intact.     Conjunctiva/sclera: Conjunctivae normal.     Pupils: Pupils are equal, round, and reactive to light.  Cardiovascular:     Rate and Rhythm: Normal rate and regular rhythm.     Pulses: Normal pulses.     Heart sounds: Normal heart sounds. No murmur heard. Pulmonary:     Effort: Pulmonary effort is normal.     Breath sounds: Rales (mild, bibasilar bases) present.  Abdominal:     General: Bowel sounds are normal.     Palpations: Abdomen is soft.  Musculoskeletal:     Right lower leg: No edema.     Left lower leg: No edema.  Skin:    General: Skin is warm and dry.  Neurological:     General: No focal deficit present.     Mental Status: He is alert.    CBC    Component Value Date/Time   WBC 8.0 08/31/2020 1125   RBC 5.34 08/31/2020 1125   HGB 15.2 08/31/2020 1125   HGB 15.3 10/17/2014 0948   HCT 44.9 08/31/2020 1125   HCT 43.5 10/17/2014 0948   PLT 356.0 08/31/2020 1125   PLT 261 10/17/2014 0948   MCV 84.1 08/31/2020 1125   MCV 86 10/17/2014 0948   MCH 28.8 07/20/2020 0100   MCHC 33.8 08/31/2020 1125   RDW 15.0 08/31/2020 1125   RDW 13.4 10/17/2014 0948   LYMPHSABS 1.0  08/31/2020 1125   LYMPHSABS 1.2 10/17/2014 0948   MONOABS 0.6 08/31/2020 1125   EOSABS 0.3 08/31/2020 1125   EOSABS 0.8 (H) 10/17/2014 0948   BASOSABS 0.1 08/31/2020 1125   BASOSABS 0.0 10/17/2014 0948    Chest imaging: NM Pulmonary Perfusion 01/14/21 There is a perfusion defect in the periphery of the right mid lung. This is at the same site as the known lipoma. A few small defects are seen at the right apex on the lateral view. No other perfusion abnormalities.  CTA Chest 07/16/20 Large, saddle type pulmonary embolism. Positive for acute PE with CT evidence of right heart strain (RV/LV Ratio = 1.4) consistent with at least submassive (intermediate risk) PE.    Hiatal hernia.  PFT: No flowsheet data found.  Labs: 10/17/2014 - Lupus anticoagulant detected (supposedly off anticoagulation at this time)  Path:  Echo 07/17/20: LV EF 55-60%. Mild concentric left hypertrophy. Grade I diastolic dysfunction. There is interventricular septum flattening in systole, consistent with RV overload. McConnell's sign noted. RV free wall is severely hypokinetic. RV systolic function is moderately reduced. RV size is moderately reduced.  Echo 01/04/21 LV EF 55-60%. Grade I diastolic dysfunction. RV systolic function is normal. Normal PASP.   Lower Extremity US 07/17/20 RIGHT:  - Findings consistent with age indeterminate deep vein thrombosis  involving the right femoral vein, right popliteal vein, right posterior  tibial veins, and right peroneal veins.  - No cystic structure found in the popliteal fossa.     LEFT:  - There is no evidence of deep vein thrombosis in the lower extremity.  - No cystic structure found in the popliteal fossa.  Assessment & Plan:   Acute saddle pulmonary embolism with acute cor pulmonale (HCC)  Lupus anticoagulant disorder (HCC)  Discussion: Nicholas Owen is a 75 year old male, never smoker with history of lupus anticoagulant, pulmonary embolism in 2013 and  admission 07/16/20 - 07/20/20 for saddle pulmonary embolus who returns to pulmonary clinic for follow up.   He is to continue coumadin therapy for his history of pulmonary emboli and DVT with lupus anticoagulant disorder. He is followed by hematology.   His V/Q scan does not indicate signs of CTEPH and his TTE shows normal RV function compared to TTE 07/17/20. These studies are reassuring for resolution of his previously noted clot burden.  No further workup required at this time from a pulmonary perspective. We discussed the need for lovenox bridge if he were to require a procedure/surgery in the future.   Follow up as needed.  Freda Jackson, MD [facility] Office: [phone]    Current Outpatient Medications:    co-enzyme Q-10 50 MG capsule, Take 200 mg by mouth daily., Disp: , Rfl:    Multiple Vitamins-Minerals (MULTIVITAMIN WITH MINERALS) tablet, Take 1 tablet by mouth daily., Disp: , Rfl:    Multiple Vitamins-Minerals (ZINC PO), Take 1 tablet by mouth 1 day or 1 dose., Disp: , Rfl:    omeprazole (PRILOSEC) 20 MG capsule, TAKE 1 CAPSULE EVERY DAY, Disp: 90 capsule, Rfl: 1   rosuvastatin (CRESTOR) 10 MG tablet, TAKE 1 TABLET EVERY DAY, Disp: 90 tablet, Rfl: 3   warfarin (COUMADIN) 5 MG tablet, Take 1 tablet by mouth daily or take as directed by anticoagulation clinic, Disp: 90 tablet, Rfl: 1

## 2021-02-11 NOTE — Patient Instructions (Addendum)
The V/Q scan shows resolution of the pulmonary emboli and no signs of chronic clots.  Your cardiac echo show return of normal right heart function.   Both of these studies are reassuring that the clots have resolved.   Continue coumadin per hematology. You will need lovenox bridging when having a procedure/surgery performed.   Please call if you have questions or worsening respiratory symptoms.

## 2021-02-18 DIAGNOSIS — N5231 Erectile dysfunction following radical prostatectomy: Secondary | ICD-10-CM | POA: Diagnosis not present

## 2021-02-18 DIAGNOSIS — N393 Stress incontinence (female) (male): Secondary | ICD-10-CM | POA: Diagnosis not present

## 2021-02-26 ENCOUNTER — Ambulatory Visit: Payer: Medicare HMO

## 2021-03-05 ENCOUNTER — Encounter: Payer: Self-pay | Admitting: Family Medicine

## 2021-03-05 ENCOUNTER — Other Ambulatory Visit: Payer: Self-pay

## 2021-03-05 ENCOUNTER — Ambulatory Visit (INDEPENDENT_AMBULATORY_CARE_PROVIDER_SITE_OTHER): Payer: Medicare HMO | Admitting: Family Medicine

## 2021-03-05 VITALS — BP 106/70 | HR 84 | Temp 97.6°F | Ht 72.0 in | Wt 259.2 lb

## 2021-03-05 DIAGNOSIS — R739 Hyperglycemia, unspecified: Secondary | ICD-10-CM | POA: Diagnosis not present

## 2021-03-05 DIAGNOSIS — I2692 Saddle embolus of pulmonary artery without acute cor pulmonale: Secondary | ICD-10-CM | POA: Diagnosis not present

## 2021-03-05 DIAGNOSIS — K219 Gastro-esophageal reflux disease without esophagitis: Secondary | ICD-10-CM | POA: Diagnosis not present

## 2021-03-05 DIAGNOSIS — D6862 Lupus anticoagulant syndrome: Secondary | ICD-10-CM | POA: Diagnosis not present

## 2021-03-05 DIAGNOSIS — E785 Hyperlipidemia, unspecified: Secondary | ICD-10-CM | POA: Diagnosis not present

## 2021-03-05 DIAGNOSIS — I2782 Chronic pulmonary embolism: Secondary | ICD-10-CM | POA: Diagnosis not present

## 2021-03-05 DIAGNOSIS — Z23 Encounter for immunization: Secondary | ICD-10-CM | POA: Diagnosis not present

## 2021-03-05 LAB — COMPREHENSIVE METABOLIC PANEL
ALT: 28 U/L (ref 0–53)
AST: 22 U/L (ref 0–37)
Albumin: 4.2 g/dL (ref 3.5–5.2)
Alkaline Phosphatase: 62 U/L (ref 39–117)
BUN: 14 mg/dL (ref 6–23)
CO2: 24 mEq/L (ref 19–32)
Calcium: 9.4 mg/dL (ref 8.4–10.5)
Chloride: 104 mEq/L (ref 96–112)
Creatinine, Ser: 0.95 mg/dL (ref 0.40–1.50)
GFR: 79.31 mL/min (ref >60.00)
Glucose, Bld: 97 mg/dL (ref 70–99)
Potassium: 4.1 mEq/L (ref 3.5–5.1)
Sodium: 137 mEq/L (ref 135–145)
Total Bilirubin: 1 mg/dL (ref 0.2–1.2)
Total Protein: 6.6 g/dL (ref 6.0–8.3)

## 2021-03-05 LAB — TSH: TSH: 3.19 u[IU]/mL (ref 0.35–5.50)

## 2021-03-05 LAB — HEMOGLOBIN A1C: Hgb A1c MFr Bld: 6.2 % (ref 4.6–6.5)

## 2021-03-05 NOTE — Patient Instructions (Addendum)
Health Maintenance Due  Topic Date Due   INFLUENZA VACCINE Done today in office.  12/31/2020   Please stop by lab before you go If you have mychart- we will send your results within 3 business days of [place] receiving them.  If you do not have mychart- we will call you about results within 5 business days of [place] receiving them.  *please also note that you will see labs on mychart as soon as they post. I will later go in and write notes on them- will say "notes from Dr. Yong Channel"  I am glad to hear that you are exercising regularly - keep up he good work!  I encourage you to try changing to a reasonably healthier diet to help assist you with weight loss. I also recommend you try calorie counting through the "MyFitnessPal" app to help keep trak of your caloric intake.   Another option would be the half plate method - cut your meals in half and eat one half of the and wait 20 minutes to see if you are still hungry - if you are you can finish the other half of your meal.  Recommended follow up: Return in about 6 months (around 09/03/2021) for a physical or sooner if needed.

## 2021-03-05 NOTE — Progress Notes (Signed)
Phone [phone] In person visit   Subjective:   Nicholas Owen is a 75 y.o. year old very pleasant male patient who presents for/with See problem oriented charting Chief Complaint  Patient presents with   Hyperglycemia   Hyperlipidemia   Gastroesophageal Reflux   This visit occurred during the SARS-CoV-2 public health emergency.  Safety protocols were in place, including screening questions prior to the visit, additional usage of staff PPE, and extensive cleaning of exam room while observing appropriate contact time as indicated for disinfecting solutions.   Past Medical History-  Patient Active Problem List   Diagnosis Date Noted   Lupus anticoagulant disorder ([place]) 04/30/2015    Priority: 1.   History of DVT of lower extremity 08/31/2012    Priority: 1.   Chronic pulmonary embolism ([place]) 04/13/2012    Priority: 1.   History of prostate cancer 05/14/2011    Priority: 1.   Diverticulitis 05/19/2018    Priority: 2.   Urinary incontinence 09/20/2014    Priority: 2.   Hyperglycemia 05/12/2012    Priority: 2.   GERD 10/04/2007    Priority: 2.   Hyperlipidemia 03/08/2007    Priority: 2.   Diverticulosis 03/22/2014    Priority: 3.   Erectile dysfunction 03/22/2014    Priority: 3.   Personal history of colonic polyps 08/31/2012    Priority: 3.   Basal cell carcinoma     Priority: 3.   Obesity 10/08/2011    Priority: 3.   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   Kidney stone 06/02/2010    Priority: 3.   Allergic rhinitis 10/04/2007    Priority: 3.   Long term (current) use of anticoagulants 10/31/2020   Primary hypercoagulable state ([place]) 10/31/2020   Pain in joint of left knee 06/15/2020   Acute appendicitis 09/15/2019    Medications- reviewed and updated Current Outpatient Medications  Medication Sig Dispense Refill   co-enzyme Q-10 50 MG capsule Take 200 mg by mouth daily.     Multiple Vitamins-Minerals (MULTIVITAMIN WITH MINERALS)  tablet Take 1 tablet by mouth daily.     Multiple Vitamins-Minerals (ZINC PO) Take 1 tablet by mouth 1 day or 1 dose.     omeprazole (PRILOSEC) 20 MG capsule TAKE 1 CAPSULE EVERY DAY 90 capsule 1   OVER THE COUNTER MEDICATION Release Dietary Supplement     rosuvastatin (CRESTOR) 10 MG tablet TAKE 1 TABLET EVERY DAY 90 tablet 3   warfarin (COUMADIN) 5 MG tablet Take 1 tablet by mouth daily or take as directed by anticoagulation clinic 90 tablet 1   No current facility-administered medications for this visit.     Objective:  BP 106/70   Pulse 84   Temp 97.6 F (36.4 C) (Temporal)   Ht 6' (1.829 m)   Wt 259 lb 3.2 oz (117.6 kg)   SpO2 95%   BMI 35.15 kg/m  Gen: NAD, resting comfortably CV: RRR no murmurs rubs or gallops Lungs: CTAB no crackles, wheeze, rhonchi Abdomen: soft/nontender/nondistended/normal bowel sounds. No rebound or guarding.  Ext: trace edema. No calf pain Skin: warm, dry    Assessment and Plan   #HM- flu shot high dose today  #Pulmonary embolism/DVT likely related to lupus anticoagulant disorder along with sedentary activity-Now requiring lifelong therapy after second PE- had clot on aspirin 325 mg #Right heart strain due to submassive saddle pulmonary embolism previously- echo improved substantially suggesting resolution along with VQ scan S:-Patient was on Eliquis 5 mg twice daily for  lifelong therapy - off of this medication now as he was transitioned to Coumadin due to cost.  Previously on oxygen therapy but was able to be weaned off prior to last visit. -We discussed in last visit for him to schedule a pulmonology follow-up and patient was seen on 02/11/2021 by Dr. Erin Fulling from Pulmonology - instructed to continue coumadin therapy for history of pulmonary emboli and DVT with lupus anticoagulant disorder - follows with hematology. V/Q scan overall reassuring-and TTE shows normal RV function compared to TTE 07/17/20 - this was reassuring for resolution of his  previously noted clot burden. No further workup was required from a pulmonary perspective and he also discussed the need for lovenox bridge if patient were to require a procedure/surgery in the future A/P: Pulmonary embolism due to lupus anticoagulant disorder-chronic issue on long-term Coumadin.  Per pulmonology would need a Lovenox bridge if were to require procedure/surgery in the future.  He has been released by pulmonology at this time.  #hyperlipidemia S: Medication: Rosuvastatin 10 mg daily  Lab Results  Component Value Date   CHOL 144 08/31/2020   HDL 40.50 08/31/2020   LDLCALC 82 08/31/2020   LDLDIRECT 78 02/29/2020   TRIG 108.0 08/31/2020   CHOLHDL 4 08/31/2020   A/P: Hesitant to increase dose of statin due to prediabetes risk-continue current medication along with LDL at least below 100  # Hyperglycemia/insulin resistance/prediabetes S:  Medication: None. Exercise and diet- last visit was more sedentary due to pulmonary embolism.  Unfortunately has gained another 4 pounds . [employer] 7 days a week now. Tried golo product that was not helpful.  -in retirement villa now so harder on lifestyle- not as active Lab Results  Component Value Date   HGBA1C 6.1 08/31/2020   HGBA1C 6.0 (H) 02/29/2020   HGBA1C 6.0 08/29/2019  A/P: Hopefully stable A1c-update with labs today.  Discussed working on lifestyle changes - difficulty losing weight and has HLD- check TSH  # GERD S:Medication: Prilosec 20 mg daily A/P: Controlled. Continue current medications.  -offered trial of pepcid but he wants to hold off  #history of prostate cancer- states PSA creeping up- following with urology and monitoring every 3-4 months with PSA   Recommended follow up: Return in about 6 months (around 09/03/2021) for a physical or sooner if needed. Future Appointments  Date Time Provider [place]  03/07/2021 10:00 AM [facility] None  11/13/2021  1:40 PM Benay Pike, MD  [facility] None  11/14/2021 11:45 AM [facility]   Lab/Order associations:   ICD-10-CM   1. Lupus anticoagulant disorder (HCC)  D68.62     2. 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 Hyperlipidemia, unspecified hyperlipidemia type  E78.5     4. Hyperglycemia  R73.9     5. Gastroesophageal reflux disease, unspecified whether esophagitis present  K21.9       No orders of the defined types were placed in this encounter.  I,Harris Phan,acting as a Education administrator for [name], MD.,have documented all relevant documentation on the behalf of [name], MD,as directed by  [name], MD while in the presence of [name], MD.  I, [name], MD, have reviewed all documentation for this visit. The documentation on 03/05/21 for the exam, diagnosis, procedures, and orders are all accurate and complete.   Return precautions advised.  [name], MD

## 2021-03-07 ENCOUNTER — Ambulatory Visit (INDEPENDENT_AMBULATORY_CARE_PROVIDER_SITE_OTHER): Payer: Medicare HMO

## 2021-03-07 ENCOUNTER — Other Ambulatory Visit: Payer: Self-pay

## 2021-03-07 DIAGNOSIS — Z7901 Long term (current) use of anticoagulants: Secondary | ICD-10-CM | POA: Diagnosis not present

## 2021-03-07 LAB — POCT INR: INR: 2.4 (ref 2.0–3.0)

## 2021-03-07 NOTE — Progress Notes (Signed)
I have reviewed and agree with note, evaluation, plan.   Bernetta Sutley, MD  

## 2021-03-07 NOTE — Patient Instructions (Addendum)
Pre visit review using our clinic review tool, if applicable. No additional management support is needed unless otherwise documented below in the visit note.  Continue to take 1 tablet daily  except 1/2 tablet on Wednesdays.  Re-check in 4 weeks.

## 2021-03-11 DIAGNOSIS — H5203 Hypermetropia, bilateral: Secondary | ICD-10-CM | POA: Diagnosis not present

## 2021-03-11 DIAGNOSIS — H2513 Age-related nuclear cataract, bilateral: Secondary | ICD-10-CM | POA: Diagnosis not present

## 2021-03-11 DIAGNOSIS — H349 Unspecified retinal vascular occlusion: Secondary | ICD-10-CM | POA: Diagnosis not present

## 2021-03-19 ENCOUNTER — Other Ambulatory Visit: Payer: Self-pay | Admitting: Family Medicine

## 2021-04-04 ENCOUNTER — Other Ambulatory Visit: Payer: Self-pay

## 2021-04-04 ENCOUNTER — Ambulatory Visit (INDEPENDENT_AMBULATORY_CARE_PROVIDER_SITE_OTHER): Payer: Medicare HMO

## 2021-04-04 DIAGNOSIS — Z7901 Long term (current) use of anticoagulants: Secondary | ICD-10-CM

## 2021-04-04 LAB — POCT INR: INR: 3.1 — AB (ref 2.0–3.0)

## 2021-04-04 NOTE — Progress Notes (Signed)
Patient ID: Nicholas Owen, male   DOB: Dec 05, 1945, 75 y.o.   MRN: [id number]  Medical screening examination/treatment/procedure(s) were performed by non-physician practitioner and as supervising physician I was immediately available for consultation/collaboration.  I agree with above. Cathlean Cower, MD

## 2021-04-04 NOTE — Patient Instructions (Addendum)
Pre visit review using our clinic review tool, if applicable. No additional management support is needed unless otherwise documented below in the visit note.  Take only 1/2 tablet tomorrow and then continue to take 1 tablet daily  except 1/2 tablet on Wednesdays.  Re-check in 4 weeks.

## 2021-04-04 NOTE — Progress Notes (Signed)
Take only 1/2 tablet tomorrow and then continue to take 1 tablet daily  except 1/2 tablet on Wednesdays.  Re-check in 4 weeks.

## 2021-04-08 ENCOUNTER — Other Ambulatory Visit: Payer: Self-pay | Admitting: Family Medicine

## 2021-04-08 DIAGNOSIS — Z7901 Long term (current) use of anticoagulants: Secondary | ICD-10-CM

## 2021-05-07 ENCOUNTER — Ambulatory Visit (INDEPENDENT_AMBULATORY_CARE_PROVIDER_SITE_OTHER): Payer: Medicare HMO

## 2021-05-07 ENCOUNTER — Other Ambulatory Visit: Payer: Self-pay

## 2021-05-07 DIAGNOSIS — Z7901 Long term (current) use of anticoagulants: Secondary | ICD-10-CM

## 2021-05-07 LAB — POCT INR: INR: 2.8 (ref 2.0–3.0)

## 2021-05-07 NOTE — Progress Notes (Signed)
Continue to take 1 tablet daily  except 1/2 tablet on Wednesdays.  Re-check in 5 weeks.

## 2021-05-07 NOTE — Patient Instructions (Addendum)
Pre visit review using our clinic review tool, if applicable. No additional management support is needed unless otherwise documented below in the visit note.  Continue to take 1 tablet daily  except 1/2 tablet on Wednesdays.  Re-check in 5 weeks.

## 2021-06-04 DIAGNOSIS — Z8546 Personal history of malignant neoplasm of prostate: Secondary | ICD-10-CM | POA: Diagnosis not present

## 2021-06-10 DIAGNOSIS — C61 Malignant neoplasm of prostate: Secondary | ICD-10-CM | POA: Diagnosis not present

## 2021-06-11 ENCOUNTER — Other Ambulatory Visit: Payer: Self-pay

## 2021-06-11 ENCOUNTER — Ambulatory Visit (INDEPENDENT_AMBULATORY_CARE_PROVIDER_SITE_OTHER): Payer: Medicare HMO

## 2021-06-11 DIAGNOSIS — Z7901 Long term (current) use of anticoagulants: Secondary | ICD-10-CM

## 2021-06-11 LAB — POCT INR: INR: 2.6 (ref 2.0–3.0)

## 2021-06-11 NOTE — Progress Notes (Signed)
Medical treatment/procedure(s) were performed by non-physician practitioner and as supervising physician I was immediately available for consultation/collaboration. I agree with above. Charity Tessier A Jacquel Mccamish, MD  

## 2021-06-11 NOTE — Progress Notes (Signed)
Continue to take 1 tablet daily  except 1/2 tablet on Wednesdays.  Re-check in 6 weeks.

## 2021-06-11 NOTE — Patient Instructions (Addendum)
Pre visit review using our clinic review tool, if applicable. No additional management support is needed unless otherwise documented below in the visit note.  Continue to take 1 tablet daily  except 1/2 tablet on Wednesdays.  Re-check in 6 weeks.

## 2021-06-17 ENCOUNTER — Other Ambulatory Visit: Payer: Self-pay | Admitting: Family Medicine

## 2021-06-17 ENCOUNTER — Ambulatory Visit (INDEPENDENT_AMBULATORY_CARE_PROVIDER_SITE_OTHER): Payer: Medicare HMO | Admitting: Family Medicine

## 2021-06-17 ENCOUNTER — Other Ambulatory Visit: Payer: Self-pay

## 2021-06-17 ENCOUNTER — Encounter: Payer: Self-pay | Admitting: Family Medicine

## 2021-06-17 VITALS — BP 128/86 | HR 85 | Temp 100.2°F | Ht 72.0 in | Wt 259.0 lb

## 2021-06-17 DIAGNOSIS — I2782 Chronic pulmonary embolism: Secondary | ICD-10-CM | POA: Diagnosis not present

## 2021-06-17 DIAGNOSIS — E785 Hyperlipidemia, unspecified: Secondary | ICD-10-CM | POA: Diagnosis not present

## 2021-06-17 DIAGNOSIS — R059 Cough, unspecified: Secondary | ICD-10-CM | POA: Diagnosis not present

## 2021-06-17 DIAGNOSIS — I2692 Saddle embolus of pulmonary artery without acute cor pulmonale: Secondary | ICD-10-CM

## 2021-06-17 MED ORDER — NIRMATRELVIR/RITONAVIR (PAXLOVID)TABLET: 3.0000 | ORAL_TABLET | Freq: Two times a day (BID) | ORAL | 0 refills | Status: AC

## 2021-06-17 NOTE — Progress Notes (Signed)
° °  Nicholas Owen is a 76 y.o. male who presents today for an office visit.  Assessment/Plan:  New/Acute Problems: Cough  High pretest probability at this point due to known sick contact and developed symptoms.  We discussed empiric treatment and he would like to proceed with this.  We will start paxlovid.  Advised patient to hold his Crestor while on this medication.  He appears well with normal respiratory exam today.  He can continue over-the-counter meds.  Discussed reasons to return to care and seek emergent care.  Chronic Problems Addressed Today: Pulmonary embolism Anticoagulated on warfarin.  Advised patient to have INR checked soon as we are starting paxlovid as above  Dyslipidemia Advised patient to hold Crestor while on paxlovid.    Subjective:  HPI:  Patient here with cough and fever. He had known covid exposure 5 days ago. Started developing symptoms 3 days ago.  Symptoms include cough and malaise.  Feels like a deep cough.  No shortness of breath.  No chest Owen.  Has tried taking Tylenol at home.  He has had a close sick contact at a lunch 5 days ago.  His friend was started on an antiviral medication.       Objective:  Physical Exam: BP 128/86    Pulse 85    Temp 100.2 F (37.9 C) (Temporal)    Ht 6' (1.829 m)    Wt 259 lb (117.5 kg)    SpO2 94%    BMI 35.13 kg/m   Gen: No acute distress, resting comfortably CV: Regular rate and rhythm with no murmurs appreciated Pulm: Normal work of breathing, clear to auscultation bilaterally with no crackles, wheezes, or rhonchi Neuro: Grossly normal, moves all extremities Psych: Normal affect and thought content      Nicholas Melfi M. Jerline Pain, MD 06/17/2021 1:28 PM

## 2021-06-17 NOTE — Patient Instructions (Signed)
It was very nice to see you today!  Please start the paxlovid.   Let us know if you symptoms are not improving.   Take care, Dr Jerline Pain  PLEASE NOTE:  If you had any lab tests please let us know if you have not heard back within a few days. You may see your results on mychart before we have a chance to review them but we will give you a call once they are reviewed by [place]. If we ordered any referrals today, please let us know if you have not heard from their office within the next week.   Please try these tips to maintain a healthy lifestyle:  Eat at least 3 REAL meals and 1-2 snacks per day.  Aim for no more than 5 hours between eating.  If you eat breakfast, please do so within one hour of getting up.   Each meal should contain half fruits/vegetables, one quarter protein, and one quarter carbs (no bigger than a computer mouse)  Cut down on sweet beverages. This includes juice, soda, and sweet tea.   Drink at least 1 glass of water with each meal and aim for at least 8 glasses per day  Exercise at least 150 minutes every week.

## 2021-06-28 ENCOUNTER — Encounter: Payer: Self-pay | Admitting: Family Medicine

## 2021-06-28 ENCOUNTER — Telehealth: Payer: Self-pay | Admitting: Family Medicine

## 2021-06-28 MED ORDER — BENZONATATE 100 MG PO CAPS: 100.0000 mg | ORAL_CAPSULE | Freq: Two times a day (BID) | ORAL | 0 refills | Status: DC | PRN

## 2021-06-28 NOTE — Telephone Encounter (Signed)
Patient calling back and states his cough is very dry and is going out of town wondering if anything could be sent in.

## 2021-06-28 NOTE — Telephone Encounter (Signed)
Pt would like a refill of Paxlovid. Pt states that he has gotten better from [place], but had a resurgence of his coughing. Pt states that his cough is a brutal dry cough. Dr. Jerline Pain prescribed the original rx for Paxlovid. Pt leaving tomorrow morning for a trip to [place]. Pt wants a call back today.   [facility].

## 2021-06-28 NOTE — Telephone Encounter (Signed)
Rx sent in- mychart sent

## 2021-06-28 NOTE — Telephone Encounter (Signed)
Informed cannot have another round of Paxlovid.   He states he continues to have dry cough non productive cough only. Coughing is keeping him awake, wants something for cough preferably tablets/capsules.  Is taking Robitussin DM right now OTC and is not effective. Denies any other S/S, please send to [facility] on [address]

## 2021-07-23 ENCOUNTER — Ambulatory Visit (INDEPENDENT_AMBULATORY_CARE_PROVIDER_SITE_OTHER): Payer: Medicare HMO

## 2021-07-23 ENCOUNTER — Telehealth: Payer: Self-pay | Admitting: Family Medicine

## 2021-07-23 ENCOUNTER — Other Ambulatory Visit: Payer: Self-pay

## 2021-07-23 DIAGNOSIS — Z7901 Long term (current) use of anticoagulants: Secondary | ICD-10-CM

## 2021-07-23 LAB — POCT INR: INR: 3.1 — AB (ref 2.0–3.0)

## 2021-07-23 NOTE — Patient Instructions (Addendum)
Pre visit review using our clinic review tool, if applicable. No additional management support is needed unless otherwise documented below in the visit note.  Hold dose tomorrow and then continue to take 1 tablet daily  except 1/2 tablet on Wednesdays.  Re-check in 4 weeks.

## 2021-07-23 NOTE — Progress Notes (Signed)
Patient ID: Nicholas Owen, male   DOB: 25-Mar-1946, 76 y.o.   MRN: [id number]  Medical screening examination/treatment/procedure(s) were performed by non-physician practitioner and as supervising physician I was immediately available for consultation/collaboration.  I agree with above. Cathlean Cower, MD

## 2021-07-23 NOTE — Chronic Care Management (AMB) (Signed)
°  Chronic Care Management   Note  07/23/2021 Name: Nicholas Owen MRN: [id number] DOB: 1945-12-30  Nicholas Owen is a 76 y.o. year old male who is a primary care patient of Marin Olp, MD. I reached out to Magnus Sinning by phone today in response to a referral sent by Mr. Sharrod Achille Dexter's PCP, Marin Olp, MD.   [name] was given information about Chronic Care Management services today including:  CCM service includes personalized support from designated clinical staff supervised by his physician, including individualized plan of care and coordination with other care providers 24/7 contact phone numbers for assistance for urgent and routine care needs. Service will only be billed when office clinical staff spend 20 minutes or more in a month to coordinate care. Only one practitioner may furnish and bill the service in a calendar month. The patient may stop CCM services at any time (effective at the end of the month) by phone call to the office staff.   Patient agreed to services and verbal consent obtained.   Follow up plan:   Tatjana Secretary/administrator

## 2021-07-23 NOTE — Progress Notes (Signed)
Pt reports he thinks he may have had covid about 4 weeks ago and has had a cough. He was treated with tessalon pearls and paxlovid. He thinks these have caused his INR to be elevated today.  Pt already took dose today. Hold dose tomorrow and then continue to take 1 tablet daily  except 1/2 tablet on Wednesdays.  Re-check in 4 weeks.

## 2021-08-06 ENCOUNTER — Telehealth: Payer: Self-pay | Admitting: Pharmacist

## 2021-08-06 NOTE — Progress Notes (Signed)
? ? ?Chronic Care Management ?Pharmacy Assistant  ? ?Name: Nicholas Owen  MRN: [id number] DOB: [birth year] ? ? ?Reason for Encounter: Chart Review For Initial Visit With Clinical Pharmacist ?  ?Conditions to be addressed/monitored: ?Lupus anticoagulant disorder, Hyperglycemia, HLD, GERD ? ?Primary concerns for visit include: ?Lupus anticoagulant disorder, HLD  ? ?Recent office visits:  ?06/17/2021 [facility] (Family Medicine) Vivi Barrack, MD;  We will start paxlovid.  Advised patient to hold his Crestor while on this medication. ? ?03/05/2021 [facility] (PCP) Marin Olp, MD; no medication changes indicated ? ?Recent consult visits:  ?02/11/2021 OV (Pulmonology) Freddi Starr, MD; no medication changes indicated.  ? ?Hospital visits:  ?None in previous 6 months ? ?Medications: ?Outpatient Encounter Medications as of 08/06/2021  ?Medication Sig  ? benzonatate (TESSALON) 100 MG capsule Take 1 capsule (100 mg total) by mouth 2 (two) times daily as needed for cough.  ? co-enzyme Q-10 50 MG capsule Take 200 mg by mouth daily.  ? Multiple Vitamins-Minerals (MULTIVITAMIN WITH MINERALS) tablet Take 1 tablet by mouth daily.  ? Multiple Vitamins-Minerals (ZINC PO) Take 1 tablet by mouth 1 day or 1 dose.  ? omeprazole (PRILOSEC) 20 MG capsule TAKE 1 CAPSULE EVERY DAY  ? OVER THE COUNTER MEDICATION Release Dietary Supplement  ? rosuvastatin (CRESTOR) 10 MG tablet TAKE 1 TABLET EVERY DAY  ? warfarin (COUMADIN) 5 MG tablet TAKE 1 TABLET BY MOUTH DAILY OR TAKE AS DIRECTED BY ANTICOAGULATION CLINIC  ? ?No facility-administered encounter medications on file as of 08/06/2021.  ? ?Current Medications: ?Benzonatate 100 mg last filled 06/28/2021 10 DS ?Rosuvastatin 10 mg last filled 06/18/2021 90 DS ?Warfarin 5 mg last filled 06/28/2021 90 DS ?Omeprazole 20 mg last filled 06/28/2021 90 DS ?Multiple Vitamins-Minerals (ZINC PO) ?Multiple Vitamins-Minerals ?Co-enzyme Q-10 50 mg takes daily ? ?Patient Questions: ?Any changes in your  medications or health? ?Patient states he hasn't had any changes in his medications or health recently. ? ?Any side effects from any medications?  ?Patient denies having any side effects from his medications. ? ?Do you have any symptoms or problems not managed by your medications? ?Patient denies having any symptoms or problems that is not currently managed by his medications. ? ?Any concerns about your health right now? ?Patient states he has his prostate removed a few years ago. He states he does have some sort of cancer but it's undetectable. ? ?Has your provider asked that you check blood pressure, blood sugar, or follow special diet at home? ?Patient does not check blood pressure or blood sugar. He does not follow any special diet. ? ?Do you get any type of exercise on a regular basis? ?Patient states he exercises at [employer] 3-4 days a week. ? ?Can you think of a goal you would like to reach for your health? ?"I would like to lose weight." ? ?Do you have any problems getting your medications? ?Eliquis was too expensive so is currently on warfarin. Patient states he would rather stay on warfarin as he believes it's a better drug for his medical condition. ? ?Is there anything that you would like to discuss during the appointment?  ?Patient states he does not have anything he would like to discuss during the appointment. ? ?Please bring medications and supplements to appointment ? ?Care Gaps: ?Medicare Annual Wellness: Completed 11/09/2020 ?Hemoglobin A1C: 6.2% on 03/05/2021 ?Colonoscopy: Next due on 03/06/2023 ? ?Future Appointments  ?Date Time Provider [place]  ?08/09/2021 11:00 AM [facility] CCM PHARMACIST [facility]  ?08/20/2021  1:00 PM [facility] None  ?11/13/2021  1:40 PM Benay Pike, MD [facility] None  ?11/14/2021 11:45 AM [facility] HEALTH COACH [facility]  ? ?Star Rating Drugs: ?Rosuvastatin 10 mg last filled 06/18/2021 90 DS ? ?April D Calhoun, [place] ?Clinical  Pharmacist Assistant ?[phone]  ?

## 2021-08-07 NOTE — Progress Notes (Signed)
Chronic Care Management Pharmacy Note  08/09/2021 Name:  Nicholas Owen MRN:  [id number] DOB:  December 27, 1945  Summary: Initial visit with PharmD.  PPI therapy > 20 years.   Currently on warfarin due to cost of Eliquis.  Therapeutic INR.  Patient aware of diet consistency requirements.    Recommendations/Changes made from today's visit: Consider DEXA due to long term PPI  Plan: FU as needed   Subjective: Nicholas Owen is an 76 y.o. year old male who is a primary patient of Hunter, Brayton Mars, MD.  The CCM team was consulted for assistance with disease management and care coordination needs.    Engaged with patient by telephone for initial visit in response to provider referral for pharmacy case management and/or care coordination services.   Consent to Services:  The patient was given the following information about Chronic Care Management services today, agreed to services, and gave verbal consent: 1. CCM service includes personalized support from designated clinical staff supervised by the primary care provider, including individualized plan of care and coordination with other care providers 2. 24/7 contact phone numbers for assistance for urgent and routine care needs. 3. Service will only be billed when office clinical staff spend 20 minutes or more in a month to coordinate care. 4. Only one practitioner may furnish and bill the service in a calendar month. 5.The patient may stop CCM services at any time (effective at the end of the month) by phone call to the office staff. 6. The patient will be responsible for cost sharing (co-pay) of up to 20% of the service fee (after annual deductible is met). Patient agreed to services and consent obtained.  Patient Care Team: Marin Olp, MD as PCP - General (Family Medicine) Ardis Hughs, MD as Consulting Physician (Urology) Edythe Clarity, [facility] as Pharmacist (Pharmacist)  Recent office visits:  06/17/2021 OV (Family  Medicine) Vivi Barrack, MD;  We will start paxlovid.  Advised patient to hold his Crestor while on this medication.   03/05/2021 [facility] (PCP) Marin Olp, MD; no medication changes indicated   Recent consult visits:  02/11/2021 OV (Pulmonology) Freddi Starr, MD; no medication changes indicated.    Hospital visits:  None in previous 6 months   Objective:  Lab Results  Component Value Date   CREATININE 0.95 03/05/2021   BUN 14 03/05/2021   GFR 79.31 03/05/2021   EGFR 84 (L) 06/16/2014   GFRNONAA >60 07/19/2020   GFRAA 89 02/29/2020   NA 137 03/05/2021   K 4.1 03/05/2021   CALCIUM 9.4 03/05/2021   CO2 24 03/05/2021   GLUCOSE 97 03/05/2021    Lab Results  Component Value Date/Time   HGBA1C 6.2 03/05/2021 10:33 AM   HGBA1C 6.1 08/31/2020 11:25 AM   GFR 79.31 03/05/2021 10:33 AM   GFR 79.59 08/31/2020 11:25 AM    Last diabetic Eye exam: No results found for: HMDIABEYEEXA  Last diabetic Foot exam: No results found for: HMDIABFOOTEX   Lab Results  Component Value Date   CHOL 144 08/31/2020   HDL 40.50 08/31/2020   LDLCALC 82 08/31/2020   LDLDIRECT 78 02/29/2020   TRIG 108.0 08/31/2020   CHOLHDL 4 08/31/2020    Hepatic Function Latest Ref Rng & Units 03/05/2021 08/31/2020 07/16/2020  Total Protein 6.0 - 8.3 g/dL 6.6 6.4 6.9  Albumin 3.5 - 5.2 g/dL 4.2 4.2 4.0  AST 0 - 37 U/L _0 ALT 0 - 53 U/L 28 36 34  Alk Phosphatase 39 - 117 U/L 62 79 71  Total Bilirubin 0.2 - 1.2 mg/dL 1.0 1.1 1.0  Bilirubin, Direct 0.0 - 0.3 mg/dL - - -    Lab Results  Component Value Date/Time   TSH 3.19 03/05/2021 10:33 AM   TSH 3.61 10/23/2015 09:23 AM    CBC Latest Ref Rng & Units 08/31/2020 07/20/2020 07/19/2020  WBC 4.0 - 10.5 K/uL 8.0 8.3 8.2  Hemoglobin 13.0 - 17.0 g/dL 15.2 13.8 13.8  Hematocrit 39.0 - 52.0 % 44.9 40.9 39.9  Platelets 150.0 - 400.0 K/uL 356.0 280 261    No results found for: VD25OH  Clinical ASCVD: No  The 10-year ASCVD risk score (Arnett DK, et  al., 2019) is: 22.4%   Values used to calculate the score:     Age: 25 years     Sex: Male     Is Non-Hispanic African American: No     Diabetic: No     Tobacco smoker: No     Systolic Blood Pressure: 093 mmHg     Is BP treated: No     HDL Cholesterol: 40.5 mg/dL     Total Cholesterol: 144 mg/dL    Depression screen [facility] 2/9 03/05/2021 11/09/2020 08/31/2020  Decreased Interest 0 0 0  Down, Depressed, Hopeless 0 0 0  PHQ - 2 Score 0 0 0  Altered sleeping - - -  Tired, decreased energy - - -  Change in appetite - - -  Feeling bad or failure about yourself  - - -  Trouble concentrating - - -  Moving slowly or fidgety/restless - - -  Suicidal thoughts - - -  PHQ-9 Score - - -  Difficult doing work/chores - - -     Social History   Tobacco Use  Smoking Status Never  Smokeless Tobacco Never  Tobacco Comments   never used tobacco   BP Readings from Last 3 Encounters:  06/17/21 128/86  03/05/21 106/70  02/11/21 128/70   Pulse Readings from Last 3 Encounters:  06/17/21 85  03/05/21 84  02/11/21 70   Wt Readings from Last 3 Encounters:  06/17/21 259 lb (117.5 kg)  03/05/21 259 lb 3.2 oz (117.6 kg)  02/11/21 257 lb 6.4 oz (116.8 kg)   BMI Readings from Last 3 Encounters:  06/17/21 35.13 kg/m  03/05/21 35.15 kg/m  02/11/21 34.91 kg/m    Assessment/Interventions: Review of patient past medical history, allergies, medications, health status, including review of consultants reports, laboratory and other test data, was performed as part of comprehensive evaluation and provision of chronic care management services.   SDOH:  (Social Determinants of Health) assessments and interventions performed: Yes  Financial Resource Strain: Low Risk    Difficulty of Paying Living Expenses: Not hard at all   Food Insecurity: No Food Insecurity   Worried About Charity fundraiser in the Last Year: Never true   Ran Out of Food in the Last Year: Never true    SDOH Screenings    Alcohol Screen: Not on file  Depression (PHQ2-9): Low Risk    PHQ-2 Score: 0  Financial Resource Strain: Low Risk    Difficulty of Paying Living Expenses: Not hard at all  Food Insecurity: No Food Insecurity   Worried About Charity fundraiser in the Last Year: Never true   Ran Out of Food in the Last Year: Never true  Housing: Low Risk    Last Housing Risk Score: 0  Physical Activity: Sufficiently Active  Days of Exercise per Week: 7 days   Minutes of Exercise per Session: 40 min  Social Connections: Moderately Integrated   Frequency of Communication with Friends and Family: More than three times a week   Frequency of Social Gatherings with Friends and Family: More than three times a week   Attends Religious Services: More than 4 times per year   Active Member of [employer] or Organizations: No   Attends Music therapist: Never   Marital Status: Married  Stress: No Stress Concern Present   Feeling of Stress : Not at all  Tobacco Use: Low Risk    Smoking Tobacco Use: Never   Smokeless Tobacco Use: Never   Passive Exposure: Not on file  Transportation Needs: No Transportation Needs   Lack of Transportation (Medical): No   Lack of Transportation (Non-Medical): No    CCM Care Plan  No Known Allergies  Medications Reviewed Today     Reviewed by Edythe Clarity, [facility] (Pharmacist) on 08/09/21 at 1137  Med List Status: <None>   Medication Order Taking? Sig Documenting Provider Last Dose Status Informant  cholecalciferol (VITAMIN D3) 25 MCG (1000 UNIT) tablet [id number] Yes Take 2,000 Units by mouth daily. [provider]  Active   co-enzyme Q-10 50 MG capsule [id number] Yes Take 200 mg by mouth daily. [provider] Taking Active Self  Multiple Vitamins-Minerals (MULTIVITAMIN WITH MINERALS) tablet [id number] Yes Take 1 tablet by mouth daily. [provider] Taking Active Self  Multiple Vitamins-Minerals (ZINC PO) [id number] Yes Take 1 tablet  by mouth 1 day or 1 dose. [provider] Taking Active Self  omeprazole (PRILOSEC) 20 MG capsule [id number] Yes TAKE 1 CAPSULE EVERY DAY Marin Olp, MD Taking Active   rosuvastatin (CRESTOR) 10 MG tablet [id number] Yes TAKE 1 TABLET EVERY DAY Marin Olp, MD Taking Active   warfarin (COUMADIN) 5 MG tablet [id number] Yes TAKE 1 TABLET BY MOUTH DAILY OR TAKE AS DIRECTED BY ANTICOAGULATION CLINIC Marin Olp, MD Taking Active             Patient Active Problem List   Diagnosis Date Noted   Long term (current) use of anticoagulants 10/31/2020   Primary hypercoagulable state ([place]) 10/31/2020   Pain in joint of left knee 06/15/2020   Diverticulitis 05/19/2018   Lupus anticoagulant disorder ([place]) 04/30/2015   Urinary incontinence 09/20/2014   Diverticulosis 03/22/2014   Erectile dysfunction 03/22/2014   History of DVT of lower extremity 08/31/2012   Personal history of colonic polyps 08/31/2012   Hyperglycemia 05/12/2012   Chronic pulmonary embolism ([place]) 04/13/2012   Basal cell carcinoma    Obesity 10/08/2011   History of prostate cancer 05/14/2011   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   Kidney stone 06/02/2010   Allergic rhinitis 10/04/2007   GERD 10/04/2007   Hyperlipidemia 03/08/2007    Immunization History  Administered Date(s) Administered   Fluad Quad(high Dose 65+) 02/29/2020, 03/05/2021   Influenza Split 03/19/2011, 03/26/2012   Influenza Whole 03/15/2008, 02/22/2009, 02/25/2010   Influenza, High Dose Seasonal PF 02/28/2015, 04/09/2016, 03/11/2017, 05/19/2018   Influenza,inj,Quad PF,6+ Mos 03/18/2013, 03/07/2014   Influenza,inj,quad, With Preservative 03/16/2020   Pneumococcal Conjugate-13 04/30/2015   Pneumococcal Polysaccharide-23 09/12/2013   Pneumococcal-Unspecified 03/16/2020   Td 11/01/2003, 09/12/2013   Zoster, Live 05/14/2011    Conditions to be addressed/monitored:  Allergic rhinitis, GERD, Hx of PE, HLD  Care Plan :  General Pharmacy (Adult)  Updates made by Edythe Clarity, RPH since 08/09/2021 12:00 AM  Problem: GERD, Hx of PE, HLD   Priority: High  Onset Date: 08/09/2021     Long-Range Goal: Patient-Specific Goal   Start Date: 08/09/2021  Expected End Date: 02/09/2022  This Visit's Progress: On track  Priority: High  Note:   Current Barriers:  Long term PPI therapy  Pharmacist Clinical Goal(s):  Patient will maintain control of lipids as evidenced by labs  through collaboration with PharmD and provider.   Interventions: 1:1 collaboration with Marin Olp, MD regarding development and update of comprehensive plan of care as evidenced by provider attestation and co-signature Inter-disciplinary care team collaboration (see longitudinal plan of care) Comprehensive medication review performed; medication list updated in electronic medical record  Hyperlipidemia: (LDL goal < 100) -Controlled -Current treatment: Rosuvastatin 29m daily Appropriate, Effective, Safe, Accessible CoQ10 2047mAppropriate, Effective, Safe, Accessible -Medications previously tried: none noted  -Current exercise habits: works out at [employer]-4 times per week cardio and weights -Educated on Cholesterol goals;  Benefits of statin for ASCVD risk reduction; Exercise goal of 150 minutes per week; -Recommended to continue current medication Continue routine lipid screening, continue active lifestyle   Hx of PE/DVT (Goal: Minimize clotting risk) -Controlled -Current treatment  Warfarin 36m736maily except 2.36mg92m Wednesdays Appropriate, Effective, Safe, Accessible -Medications previously tried: Eliquis ($) -Taking warfarin now with no concerns.  INR is almost always therapeutic.  Once or twice it has been 3.1.  Well educated on diet consistency, etc. Required for warfarin.  No changes at this time he prefers to stay on current therapy.  Goes every 4-6 weeks for INR checks.  -Recommended to continue current  medication  GERD (Goal: Minimize symptoms) -Controlled -Current treatment  Omeprazole 20mg70mery Appropriate,,  -Medications previously tried: none noted - He reports he has been taking the for about 20 years.  Previously had really intense symptoms causing nasuea/vomiting.  He does not really want to stop taking medication.  No DEXA on file.  Would consider due to long term PPI therapy.  -Recommended DEXA, consider step down and treatment with OTC Pepcid if patient agreeable.  Patient Goals/Self-Care Activities Patient will:  - focus on medication adherence by pill counts target a minimum of 150 minutes of moderate intensity exercise weekly  Follow Up Plan: The patient has been provided with contact information for the care management team and has been advised to call with any health related questions or concerns.       Medication Assistance: None required.  Patient affirms current coverage meets needs.  Compliance/Adherence/Medication fill history: Care Gaps: DEXA  Star-Rating Drugs: Rosuvastatin 10 mg last filled 06/18/2021 90 DS  [facility] [facility]:  [facility] [id number]: [phone]: [phone] pill box? No - few meds Pt endorses 100% compliance  We discussed: Benefits of medication synchronization, packaging and delivery as well as enhanced pharmacist oversight with [employer]. Patient decided to: Continue current medication management strategy  Care Plan and Follow Up Patient Decision:  Patient agrees to Care Plan and Follow-up.  Plan: The patient has been provided with contact information for the care management team and has been advised to call with any health related questions or concerns.    ChrisBeverly MilchrmD Clinical Pharmacist  [facility])[phone]

## 2021-08-08 ENCOUNTER — Telehealth: Payer: Self-pay | Admitting: Hematology and Oncology

## 2021-08-08 NOTE — Telephone Encounter (Signed)
Rescheduled appointment per providers. Patient aware.  ? ?

## 2021-08-09 ENCOUNTER — Ambulatory Visit (INDEPENDENT_AMBULATORY_CARE_PROVIDER_SITE_OTHER): Payer: Medicare HMO | Admitting: Pharmacist

## 2021-08-09 DIAGNOSIS — E785 Hyperlipidemia, unspecified: Secondary | ICD-10-CM

## 2021-08-09 DIAGNOSIS — I2782 Chronic pulmonary embolism: Secondary | ICD-10-CM

## 2021-08-09 DIAGNOSIS — Z86718 Personal history of other venous thrombosis and embolism: Secondary | ICD-10-CM

## 2021-08-09 DIAGNOSIS — I2692 Saddle embolus of pulmonary artery without acute cor pulmonale: Secondary | ICD-10-CM

## 2021-08-09 DIAGNOSIS — K219 Gastro-esophageal reflux disease without esophagitis: Secondary | ICD-10-CM

## 2021-08-09 NOTE — Patient Instructions (Addendum)
Visit Information ? ? Goals Addressed   ? ?  ?  ?  ?  ? This Visit's Progress  ?  Manage My Medicine     ?  Timeframe:  Long-Range Goal ?Priority:  High ?Start Date:  08/09/21                           ?Expected End Date:  02/09/22                    ? ?Follow Up Date 11/09/21  ?  ?Continue to be adherent with medications  ?  ?Why is this important?   ?These steps will help you keep on track with your medicines. ?  ?Notes:  ?  ? ?  ? ?Patient Care Plan: General Pharmacy (Adult)  ?  ? ?Problem Identified: GERD, Hx of PE, HLD   ?Priority: High  ?Onset Date: 08/09/2021  ?  ? ?Long-Range Goal: Patient-Specific Goal   ?Start Date: 08/09/2021  ?Expected End Date: 02/09/2022  ?This Visit's Progress: On track  ?Priority: High  ?Note:   ?Current Barriers:  ?Long term PPI therapy ? ?Pharmacist Clinical Goal(s):  ?Patient will maintain control of lipids as evidenced by labs  through collaboration with PharmD and provider.  ? ?Interventions: ?1:1 collaboration with Marin Olp, MD regarding development and update of comprehensive plan of care as evidenced by provider attestation and co-signature ?Inter-disciplinary care team collaboration (see longitudinal plan of care) ?Comprehensive medication review performed; medication list updated in electronic medical record ? ?Hyperlipidemia: (LDL goal < 100) ?-Controlled ?-Current treatment: ?Rosuvastatin '10mg'$  daily Appropriate, Effective, Safe, Accessible ?CoQ10 '200mg'$  Appropriate, Effective, Safe, Accessible ?-Medications previously tried: none noted  ?-Current exercise habits: works out at [employer] 3-4 times per week cardio and weights ?-Educated on Cholesterol goals;  ?Benefits of statin for ASCVD risk reduction; ?Exercise goal of 150 minutes per week; ?-Recommended to continue current medication ?Continue routine lipid screening, continue active lifestyle ? ? 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  INR is almost always therapeutic.  Once or twice it has been 3.1.  Well educated on diet consistency, etc. Required for warfarin.  No changes at this time he prefers to stay on current therapy.  Goes every 4-6 weeks for INR checks.  ?-Recommended to continue current medication ? ?GERD (Goal: Minimize symptoms) ?-Controlled ?-Current treatment  ?Omeprazole '20mg'$   Query Appropriate,,  ?-Medications previously tried: none noted ?- He reports he has been taking the for about 20 years.  Previously had really intense symptoms causing nasuea/vomiting.  He does not really want to stop taking medication.  No DEXA on file.  Would consider due to long term PPI therapy.  ?-Recommended DEXA, consider step down and treatment with OTC Pepcid if patient agreeable. ? ?Patient Goals/Self-Care Activities ?Patient will:  ?- focus on medication adherence by pill counts ?target a minimum of 150 minutes of moderate intensity exercise weekly ? ?Follow Up Plan: The patient has been provided with contact information for the care management team and has been advised to call with any health related questions or concerns.  ?  ? ? ?Nicholas Owen was given information about Chronic Care Management services today including:  ?CCM service includes personalized support from designated clinical staff supervised by his physician, including individualized plan of care  and coordination with other care providers ?24/7 contact phone numbers for assistance for urgent and routine care needs. ?Standard insurance, coinsurance, copays and deductibles apply for chronic care management only during months in which we provide at least 20 minutes of these services. Most insurances cover these services at 100%, however patients may be responsible for any copay, coinsurance and/or deductible if applicable. This service may help you  avoid the need for more expensive face-to-face services. ?Only one practitioner may furnish and bill the service in a calendar month. ?The patient may stop CCM services at any time (effective at the end of the month) by phone call to the office staff. ? ?Patient agreed to services and verbal consent obtained.  ? ?The patient verbalized understanding of instructions, educational materials, and care plan provided today and agreed to receive a mailed copy of patient instructions, educational materials, and care plan.  ?Telephone follow up appointment with pharmacy team member scheduled for: As needed follow ups per patient ? ?Edythe Clarity, [facility]  ?Beverly Milch, PharmD ?Clinical Pharmacist  ?Nicholas Owen ?([phone] ? ?

## 2021-08-20 ENCOUNTER — Ambulatory Visit (INDEPENDENT_AMBULATORY_CARE_PROVIDER_SITE_OTHER): Payer: Medicare HMO

## 2021-08-20 ENCOUNTER — Other Ambulatory Visit: Payer: Self-pay

## 2021-08-20 DIAGNOSIS — Z7901 Long term (current) use of anticoagulants: Secondary | ICD-10-CM

## 2021-08-20 LAB — POCT INR: INR: 2.6 (ref 2.0–3.0)

## 2021-08-20 NOTE — Progress Notes (Signed)
Continue to take 1 tablet daily  except 1/2 tablet on Wednesdays.  Re-check in 6 weeks. ?

## 2021-08-20 NOTE — Patient Instructions (Addendum)
Pre visit review using our clinic review tool, if applicable. No additional management support is needed unless otherwise documented below in the visit note. ? ?Continue to take 1 tablet daily  except 1/2 tablet on Wednesdays.  Re-check in 6 weeks. ?

## 2021-08-30 DIAGNOSIS — E785 Hyperlipidemia, unspecified: Secondary | ICD-10-CM

## 2021-09-24 ENCOUNTER — Telehealth: Payer: Self-pay | Admitting: Hematology and Oncology

## 2021-09-24 NOTE — Telephone Encounter (Signed)
Rescheduled appointment per providers. Patient aware.  ? ?

## 2021-10-01 ENCOUNTER — Ambulatory Visit (INDEPENDENT_AMBULATORY_CARE_PROVIDER_SITE_OTHER): Payer: Medicare HMO

## 2021-10-01 DIAGNOSIS — Z7901 Long term (current) use of anticoagulants: Secondary | ICD-10-CM

## 2021-10-01 LAB — POCT INR: INR: 3.5 — AB (ref 2.0–3.0)

## 2021-10-01 NOTE — Patient Instructions (Addendum)
Pre visit review using our clinic review tool, if applicable. No additional management support is needed unless otherwise documented below in the visit note. ? ?Hold dose tomorrow and then change weekly dose to take 1 tablet daily except take 1/2 on Wednesdays and Sundays. Recheck in 3 weeks.  ?

## 2021-10-01 NOTE — Progress Notes (Signed)
Hold dose tomorrow and then change weekly dose to take 1 tablet daily except take 1/2 on Wednesdays and Sundays. Recheck in 3 weeks.  ?

## 2021-10-22 ENCOUNTER — Ambulatory Visit (INDEPENDENT_AMBULATORY_CARE_PROVIDER_SITE_OTHER): Payer: Medicare HMO

## 2021-10-22 DIAGNOSIS — Z7901 Long term (current) use of anticoagulants: Secondary | ICD-10-CM

## 2021-10-22 LAB — POCT INR: INR: 2.1 (ref 2.0–3.0)

## 2021-10-22 NOTE — Patient Instructions (Addendum)
Pre visit review using our clinic review tool, if applicable. No additional management support is needed unless otherwise documented below in the visit note.  Continue 1 tablet daily except take 1/2 on Wednesdays and Sundays. Recheck in 5 weeks.

## 2021-10-22 NOTE — Progress Notes (Cosign Needed)
Continue 1 tablet daily except take 1/2 on Wednesdays and Sundays. Recheck in 5 weeks.

## 2021-11-08 ENCOUNTER — Inpatient Hospital Stay: Payer: Medicare HMO | Attending: Hematology and Oncology | Admitting: Hematology and Oncology

## 2021-11-08 ENCOUNTER — Encounter: Payer: Self-pay | Admitting: Hematology and Oncology

## 2021-11-08 ENCOUNTER — Other Ambulatory Visit: Payer: Self-pay

## 2021-11-08 VITALS — BP 130/84 | HR 64 | Temp 98.1°F | Resp 16 | Ht 72.0 in | Wt 257.3 lb

## 2021-11-08 DIAGNOSIS — Z86718 Personal history of other venous thrombosis and embolism: Secondary | ICD-10-CM | POA: Insufficient documentation

## 2021-11-08 DIAGNOSIS — I2602 Saddle embolus of pulmonary artery with acute cor pulmonale: Secondary | ICD-10-CM | POA: Diagnosis not present

## 2021-11-08 DIAGNOSIS — Z79899 Other long term (current) drug therapy: Secondary | ICD-10-CM | POA: Insufficient documentation

## 2021-11-08 DIAGNOSIS — Z8546 Personal history of malignant neoplasm of prostate: Secondary | ICD-10-CM | POA: Diagnosis not present

## 2021-11-08 DIAGNOSIS — D6862 Lupus anticoagulant syndrome: Secondary | ICD-10-CM

## 2021-11-08 DIAGNOSIS — Z86711 Personal history of pulmonary embolism: Secondary | ICD-10-CM | POA: Insufficient documentation

## 2021-11-08 DIAGNOSIS — Z7901 Long term (current) use of anticoagulants: Secondary | ICD-10-CM | POA: Insufficient documentation

## 2021-11-08 NOTE — Progress Notes (Signed)
[place] NOTE  Patient Care Team: Nicholas Olp, MD as PCP - General (Family Medicine) Nicholas Hughs, MD as Consulting Physician (Urology) Nicholas Owen, [facility] as Pharmacist (Pharmacist)  CHIEF COMPLAINTS/PURPOSE OF CONSULTATION:  PE, anticoagulation recommendations, lupus anticoagulant.  ASSESSMENT & PLAN:   This is a very pleasant 76 year old male patient with a past medical history significant for SVT many years ago, PE back in 2013 and most recently saddle PE in 2022 referred to hematology for anticoagulation recommendations We recommended indefinite anticoagulation with Coumadin.He had lupus anticoagulant testing on multiple occasions which suggested presence of antiphospholipid antibody syndrome. He is here for follow up, he is doing well. No concerning ROS PE findings unremarkable, no lymphadenopathy or HS megaly. At this time, he didn't report any new concerns for DVT/PE. He seems to be therapeutic with his INR for the most part. He is agreeable to continuing indefinite anticoagulation.  With regards to his elevated PSA, it looks like he is describing concerns for biochemical recurrence. We discussed that there are many options for treatment in case he recurs.  With regards to pre DM, he can FU with his PCP and can be monitored every 3/4 months. He seems to be really concerned about this. He would like to follow up with [place] [facility].  HISTORY OF PRESENTING ILLNESS:  Nicholas Owen 76 y.o. male is here because of history of PE and lupus anticoagulant. Please to refer to my initial consult for full history  He has past medical history significant for prostate cancer status post robot-assisted laparoscopic radical prostatectomy in July 2013 with lymph node dissection.  Interval history  Nicholas Owen is here for a follow-up with his wife. He is doing well, coumadin monitoring is going well. He gets INR check every 4-6 weeks, mostly with in  therapeutic range. He denies any change in breathing, leg swelling. He has some baseline leg swelling. He was concerned about rising PSA and prediabetes. No bleeding issues. Rest of the pertinent 10 point ROS reviewed and negative.  MEDICAL HISTORY:  Past Medical History:  Diagnosis Date   Allergy    Asbestos exposure    Basal cell carcinoma    skin of left ear   BPH (benign prostatic hyperplasia)    Cancer (HCC)    basal cell CA (skin)   Colitis 01/2011   Colon polyp    Deviated nasal septum    hx. of   Diverticulosis    DVT (deep venous thrombosis) (HCC)    GERD (gastroesophageal reflux disease)    Hyperlipidemia    Kidney stones    Lipoma 12-24-11   multiple(present- lipoma 2-lt./4- rt arms/ back/ chest   Liver cyst 10/11   4 simple cysts in liver   Prostate cancer ([place]) 10/06/11   Gleason 3+4=7, vol 39 cc, PSA 5.15   Prostate cancer ([place]) 12-24-11   dx. Prostate cancer after bx.   Ulcerative colitis ([place])     SURGICAL HISTORY: Past Surgical History:  Procedure Laterality Date   COLONOSCOPY     CYSTOSCOPY  02/16/2012   Procedure: CYSTOSCOPY FLEXIBLE;  Surgeon: Nicholas Amass, MD;  Location: [facility];  Service: Urology;  Laterality: N/A;  FLEX CYSTO WITH REMOVAL OF STAPLES    INGUINAL HERNIA REPAIR  1990's   double   KNEE SURGERY  2007   right knee scope   LAPAROSCOPIC APPENDECTOMY N/A 09/15/2019   Procedure: APPENDECTOMY LAPAROSCOPIC;  Surgeon: Nicholas Pickerel, MD;  Location: [facility];  Service: General;  Laterality: N/A;   lipoma removals  1990's   multiple and multiple remains now   LYMPH NODE DISSECTION  12/31/2011   Procedure: LYMPH NODE DISSECTION;  Surgeon: Nicholas Amass, MD;  Location: [facility];  Service: Urology;  Laterality: Bilateral;  Bilateral   POLYPECTOMY     ROBOT ASSISTED LAPAROSCOPIC RADICAL PROSTATECTOMY  12/31/2011   Procedure: ROBOTIC ASSISTED LAPAROSCOPIC RADICAL PROSTATECTOMY;  Surgeon: Nicholas Amass, MD;  Location: [facility];  Service:  Urology;  Laterality: N/A;       TONSILLECTOMY      SOCIAL HISTORY: Social History   Socioeconomic History   Marital status: Married    Spouse name: Not on file   Number of children: Not on file   Years of education: Not on file   Highest education level: Not on file  Occupational History   Occupation: Retired    Fish farm manager: RETIRED  Tobacco Use   Smoking status: Never   Smokeless tobacco: Never   Tobacco comments:    never used tobacco  Vaping Use   Vaping Use: Never used  Substance and Sexual Activity   Alcohol use: Yes    Comment: occasional    Drug use: No   Sexual activity: Not on file  Other Topics Concern   Not on file  Social History Narrative   Married, 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       Hobbies: travels fair amount has timeshare and also travels more than that, enjoys Haematologist, home repair      Physician roster   Nicholas Owen - gastroenterology in past   Nicholas Owen - hematology (released around 2016)   Nicholas Owen (prior [place]) - urology   Nicholas Owen - Ophthalmology   Nicholas Owen- dermatology   Social Determinants of Health   Financial Resource Strain: Low Risk  (11/09/2020)   Overall Financial Resource Strain (CARDIA)    Difficulty of Paying Living Expenses: Not hard at all  Food Insecurity: No Food Insecurity (11/09/2020)   Hunger Vital Sign    Worried About [place] in the Last Year: Never true    [place] in the Last Year: Never true  Transportation Needs: No Transportation Needs (11/09/2020)   PRAPARE - Hydrologist (Medical): No    Lack of Transportation (Non-Medical): No  Physical Activity: Sufficiently Active (11/09/2020)   Exercise Vital Sign    Days of Exercise per Week: 7 days    Minutes of Exercise per Session: 40 min  Stress: No Stress Concern Present (11/09/2020)   Nicholas Owen    Feeling of Stress : Not at all  Social Connections: Moderately Integrated (11/09/2020)   Social Connection and Isolation Panel [NHANES]    Frequency of Communication with Friends and Family: More than three times a week    Frequency of Social Gatherings with Friends and Family: More than three times a week    Attends Religious Services: More than 4 times per year    Active Member of [employer] or Organizations: No    Attends Archivist Meetings: Never    Marital Status: Married  Human resources officer Violence: Not At Risk (11/09/2020)   Humiliation, Afraid, Rape, and Kick questionnaire    Fear of Current or Ex-Partner: No    Emotionally Abused: No    Physically Abused: No    Sexually Abused:  No    FAMILY HISTORY: Family History  Problem Relation Age of Onset   Colon cancer Mother 38   Colon cancer Father 60   Prostate cancer Paternal Grandfather    Esophageal cancer Paternal Grandfather    Stomach cancer Neg Hx    Rectal cancer Neg Hx     ALLERGIES:  has No Known Allergies.  MEDICATIONS:  Current Outpatient Medications  Medication Sig Dispense Refill   cholecalciferol (VITAMIN D3) 25 MCG (1000 UNIT) tablet Take 2,000 Units by mouth daily.     co-enzyme Q-10 50 MG capsule Take 200 mg by mouth daily.     Multiple Vitamins-Minerals (MULTIVITAMIN WITH MINERALS) tablet Take 1 tablet by mouth daily.     Multiple Vitamins-Minerals (ZINC PO) Take 1 tablet by mouth 1 day or 1 dose.     omeprazole (PRILOSEC) 20 MG capsule TAKE 1 CAPSULE EVERY DAY 90 capsule 1   rosuvastatin (CRESTOR) 10 MG tablet TAKE 1 TABLET EVERY DAY 90 tablet 3   warfarin (COUMADIN) 5 MG tablet TAKE 1 TABLET BY MOUTH DAILY OR TAKE AS DIRECTED BY ANTICOAGULATION CLINIC 90 tablet 1   No current facility-administered medications for this visit.    PHYSICAL EXAMINATION: ECOG PERFORMANCE STATUS: 0 - Asymptomatic  Vitals:   11/08/21 1310  BP: 130/84  Pulse: 64  Resp: 16  Temp: 98.1 F (36.7 C)   SpO2: 95%     Filed Weights   11/08/21 1310  Weight: 257 lb 4.8 oz (116.7 kg)    Physical Exam Constitutional:      Appearance: Normal appearance.  HENT:     Head: Normocephalic and atraumatic.  Cardiovascular:     Rate and Rhythm: Normal rate and regular rhythm.     Pulses: Normal pulses.     Heart sounds: Normal heart sounds.  Pulmonary:     Effort: Pulmonary effort is normal.     Breath sounds: Normal breath sounds.  Musculoskeletal:        General: Swelling (BLE 1+) present. Normal range of motion.     Cervical back: Normal range of motion and neck supple.  Lymphadenopathy:     Cervical: No cervical adenopathy.  Neurological:     Mental Status: He is alert.      LABORATORY DATA:  I have reviewed the data as listed Lab Results  Component Value Date   WBC 8.0 08/31/2020   HGB 15.2 08/31/2020   HCT 44.9 08/31/2020   MCV 84.1 08/31/2020   PLT 356.0 08/31/2020     Chemistry      Component Value Date/Time   NA 137 03/05/2021 1033   NA 139 06/16/2014 1206   K 4.1 03/05/2021 1033   K 4.4 06/16/2014 1206   CL 104 03/05/2021 1033   CO2 24 03/05/2021 1033   CO2 24 06/16/2014 1206   BUN 14 03/05/2021 1033   BUN 17.2 06/16/2014 1206   CREATININE 0.95 03/05/2021 1033   CREATININE 0.98 02/29/2020 1140   CREATININE 0.9 06/16/2014 1206      Component Value Date/Time   CALCIUM 9.4 03/05/2021 1033   CALCIUM 8.9 06/16/2014 1206   ALKPHOS 62 03/05/2021 1033   ALKPHOS 74 06/16/2014 1206   AST 22 03/05/2021 1033   AST 19 06/16/2014 1206   ALT 28 03/05/2021 1033   ALT 22 06/16/2014 1206   BILITOT 1.0 03/05/2021 1033   BILITOT 0.92 06/16/2014 1206       No results found.  I spent 30 minutes in the care of  this patient including H and P, review of records, documentation, counseling and coordination of care.  All questions were answered. The patient knows to call the clinic with any problems, questions or concerns.   Benay Pike, MD 11/08/2021 3:07 PM

## 2021-11-13 ENCOUNTER — Ambulatory Visit: Payer: Medicare HMO | Admitting: Hematology and Oncology

## 2021-11-14 ENCOUNTER — Ambulatory Visit (INDEPENDENT_AMBULATORY_CARE_PROVIDER_SITE_OTHER): Payer: Medicare HMO

## 2021-11-14 DIAGNOSIS — Z0000 Encounter for general adult medical examination without abnormal findings: Secondary | ICD-10-CM | POA: Diagnosis not present

## 2021-11-14 NOTE — Progress Notes (Signed)
Virtual Visit via Telephone Note  I connected with  Nicholas Owen on 11/14/21 at 12:00 PM EDT by telephone and verified that I am speaking with the correct person using two identifiers.  Medicare Annual Wellness visit completed telephonically due to Covid-19 pandemic.   Persons participating in this call: This Health Coach and this patient.   Location: Patient: home  Provider: office   I discussed the limitations, risks, security and privacy concerns of performing an evaluation and management service by telephone and the availability of in person appointments. The patient expressed understanding and agreed to proceed.  Unable to perform video visit due to video visit attempted and failed and/or patient does not have video capability.   Some vital signs may be absent or patient reported.   Willette Brace, LPN   Subjective:   Nicholas Owen is a 76 y.o. male who presents for Medicare Annual/Subsequent preventive examination.  Review of Systems     Cardiac Risk Factors include: advanced age (>35mn, >>50women);male gender;dyslipidemia;obesity (BMI >30kg/m2)     Objective:    There were no vitals filed for this visit. There is no height or weight on file to calculate BMI.     11/14/2021   11:49 AM 11/09/2020   11:48 AM 10/18/2020   10:24 AM 07/16/2020   10:00 PM 07/16/2020    5:24 PM 09/15/2019    9:35 AM 09/15/2019    3:33 AM  Advanced Directives  Does Patient Have a Medical Advance Directive? No No No No No No   Would patient like information on creating a medical advance directive? No - Patient declined No - Patient declined No - Patient declined No - Patient declined  No - Patient declined No - Patient declined    Current Medications (verified) Outpatient Encounter Medications as of 11/14/2021  Medication Sig   cholecalciferol (VITAMIN D3) 25 MCG (1000 UNIT) tablet Take 2,000 Units by mouth daily.   co-enzyme Q-10 50 MG capsule Take 200 mg by mouth daily.    Multiple Vitamins-Minerals (MULTIVITAMIN WITH MINERALS) tablet Take 1 tablet by mouth daily.   Multiple Vitamins-Minerals (ZINC PO) Take 1 tablet by mouth 1 day or 1 dose.   omeprazole (PRILOSEC) 20 MG capsule TAKE 1 CAPSULE EVERY DAY   rosuvastatin (CRESTOR) 10 MG tablet TAKE 1 TABLET EVERY DAY   warfarin (COUMADIN) 5 MG tablet TAKE 1 TABLET BY MOUTH DAILY OR TAKE AS DIRECTED BY ANTICOAGULATION CLINIC   No facility-administered encounter medications on file as of 11/14/2021.    Allergies (verified) Patient has no known allergies.   History: Past Medical History:  Diagnosis Date   Allergy    Asbestos exposure    Basal cell carcinoma    skin of left ear   BPH (benign prostatic hyperplasia)    Cancer (HCC)    basal cell CA (skin)   Colitis 01/2011   Colon polyp    Deviated nasal septum    hx. of   Diverticulosis    DVT (deep venous thrombosis) (HCC)    GERD (gastroesophageal reflux disease)    Hyperlipidemia    Kidney stones    Lipoma 12-24-11   multiple(present- lipoma 2-lt./4- rt arms/ back/ chest   Liver cyst 10/11   4 simple cysts in liver   [place]   Gleason 3+4=7, vol 39 cc, PSA 5.15   [place]   dx. Prostate cancer after bx.   Ulcerative colitis ([facility]    Past Surgical History:  Procedure Laterality Date   COLONOSCOPY     CYSTOSCOPY  02/16/2012   Procedure: CYSTOSCOPY FLEXIBLE;  Surgeon: Bernestine Amass, MD;  Location: [facility];  Service: Urology;  Laterality: N/A;  FLEX CYSTO WITH REMOVAL OF STAPLES    INGUINAL HERNIA REPAIR  1990's   double   KNEE SURGERY  2007   right knee scope   LAPAROSCOPIC APPENDECTOMY N/A 09/15/2019   Procedure: APPENDECTOMY LAPAROSCOPIC;  Surgeon: Greer Pickerel, MD;  Location: [facility];  Service: General;  Laterality: N/A;   lipoma removals  1990's   multiple and multiple remains now   LYMPH NODE DISSECTION  12/31/2011   Procedure: LYMPH NODE DISSECTION;  Surgeon: Bernestine Amass,  MD;  Location: [facility];  Service: Urology;  Laterality: Bilateral;  Bilateral   POLYPECTOMY     ROBOT ASSISTED LAPAROSCOPIC RADICAL PROSTATECTOMY  12/31/2011   Procedure: ROBOTIC ASSISTED LAPAROSCOPIC RADICAL PROSTATECTOMY;  Surgeon: Bernestine Amass, MD;  Location: [facility];  Service: Urology;  Laterality: N/A;       TONSILLECTOMY     Family History  Problem Relation Age of Onset   Colon cancer Mother 24   Colon cancer Father 40   Prostate cancer Paternal Grandfather    Esophageal cancer Paternal Grandfather    Stomach cancer Neg Hx    Rectal cancer Neg Hx    Social History   Socioeconomic History   Marital status: Married    Spouse name: Not on file   Number of children: Not on file   Years of education: Not on file   Highest education level: Not on file  Occupational History   Occupation: Retired    Fish farm manager: RETIRED  Tobacco Use   Smoking status: Never   Smokeless tobacco: Never   Tobacco comments:    never used tobacco  Vaping Use   Vaping Use: Never used  Substance and Sexual Activity   Alcohol use: Yes    Comment: occasional    Drug use: No   Sexual activity: Not on file  Other Topics Concern   Not on file  Social History Narrative   Married, 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       Hobbies: travels fair amount has timeshare and also travels more than that, enjoys Haematologist, home repair      Physician roster   Delfin Edis - gastroenterology in past   Burney Gauze - hematology (released around 2016)   Dr. Louis Meckel (prior [place]) - urology   Dr. Herbert Deaner - Ophthalmology   Dr. [place] Crane- dermatology   Social Determinants of Health   Financial Resource Strain: Low Risk  (11/14/2021)   Overall Financial Resource Strain (CARDIA)    Difficulty of Paying Living Expenses: Not hard at all  Food Insecurity: No Food Insecurity (11/14/2021)   Hunger Vital Sign    Worried About [place] in the Last Year: Never  true    [place] in the Last Year: Never true  Transportation Needs: No Transportation Needs (11/14/2021)   PRAPARE - Hydrologist (Medical): No    Lack of Transportation (Non-Medical): No  Physical Activity: Sufficiently Active (11/14/2021)   Exercise Vital Sign    Days of Exercise per Week: 4 days    Minutes of Exercise per Session: 60 min  Stress: No Stress Concern Present (11/14/2021)   [place]  Feeling of Stress : Not at all  Social Connections: Moderately Integrated (11/14/2021)   Social Connection and Isolation Panel [NHANES]    Frequency of Communication with Friends and Family: Three times a week    Frequency of Social Gatherings with Friends and Family: More than three times a week    Attends Religious Services: More than 4 times per year    Active Member of [employer] or Organizations: No    Attends Music therapist: Never    Marital Status: Married    Tobacco Counseling Counseling given: Not Answered Tobacco comments: never used tobacco   Clinical Intake:  Pre-visit preparation completed: Yes  Pain : No/denies pain     BMI - recorded: 34.9 Nutritional Status: BMI > 30  Obese Nutritional Risks: None Diabetes: No  How often do you need to have someone help you when you read instructions, pamphlets, or other written materials from your doctor or pharmacy?: 1 - Never  Diabetic?no  Interpreter Needed?: No  Information entered by :: Charlott Rakes, LPN   Activities of Daily Living    11/14/2021   11:50 AM  In your present state of health, do you have any difficulty performing the following activities:  Hearing? 0  Vision? 0  Difficulty concentrating or making decisions? 0  Walking or climbing stairs? 1  Comment realted to knees  Dressing or bathing? 0  Doing errands, shopping? 0  Preparing Food and eating ? N  Using the Toilet? N  In the  past six months, have you accidently leaked urine? Y  Comment wears a brief  Do you have problems with loss of bowel control? N  Managing your Medications? N  Managing your Finances? N  Housekeeping or managing your Housekeeping? N    Patient Care Team: Marin Olp, MD as PCP - General (Family Medicine) Ardis Hughs, MD as Consulting Physician (Urology) Edythe Clarity, [facility] as Pharmacist (Pharmacist)  Indicate any recent Medical Services you may have received from other than [facility] providers in the past year (date may be approximate).     Assessment:   This is a routine wellness examination for Arley.  Hearing/Vision screen Hearing Screening - Comments:: Pt denies any hearing issues  Vision Screening - Comments:: Pt follows up with Dr Herbert Deaner for annual eye exams   Dietary issues and exercise activities discussed: Current Exercise Habits: Home exercise routine, Type of exercise: Other - see comments, Time (Minutes): 60, Frequency (Times/Week): 4, Weekly Exercise (Minutes/Week): 240   Goals Addressed             This Visit's Progress    Patient Stated       Lose weight        Depression Screen    11/14/2021   11:48 AM 03/05/2021    9:53 AM 11/09/2020   11:46 AM 08/31/2020   10:45 AM 07/25/2020    8:49 AM 08/29/2019   11:16 AM 02/23/2018    1:03 PM  PHQ 2/9 Scores  PHQ - 2 Score 0 0 0 0 0 0 0  PHQ- 9 Score      0     Fall Risk    11/14/2021   11:49 AM 03/05/2021    9:53 AM 11/09/2020   11:48 AM 08/31/2020   10:45 AM 07/25/2020    8:49 AM  Fall Risk   Falls in the past year? 0 0 0 0 0  Number falls in past yr: 0 0 0 0 0  Injury  with Fall? 0 0 0 0 0  Risk for fall due to : Impaired vision No Fall Risks Impaired vision;Impaired mobility    Follow up Falls prevention discussed Falls evaluation completed Falls prevention discussed      FALL RISK PREVENTION PERTAINING TO THE HOME:  Any stairs in or around the home? No  If so, are there any without  handrails? No  Home free of loose throw rugs in walkways, pet beds, electrical cords, etc? Yes  Adequate lighting in your home to reduce risk of falls? Yes   ASSISTIVE DEVICES UTILIZED TO PREVENT FALLS:  Life alert? No  Use of a cane, walker or w/c? No  Grab bars in the bathroom? Yes  Shower chair or bench in shower? Yes  Elevated toilet seat or a handicapped toilet? Yes   TIMED UP AND GO:  Was the test performed? No .   Cognitive Function:        11/14/2021   11:52 AM 11/09/2020   11:51 AM  6CIT Screen  What Year? 0 points 0 points  What month? 0 points 0 points  What time? 0 points 0 points  Count back from 20 0 points 0 points  Months in reverse 0 points 0 points  Repeat phrase 0 points 0 points  Total Score 0 points 0 points    Immunizations Immunization History  Administered Date(s) Administered   Fluad Quad(high Dose 65+) 02/29/2020, 03/05/2021   Influenza Split 03/19/2011, 03/26/2012   Influenza Whole 03/15/2008, 02/22/2009, 02/25/2010   Influenza, High Dose Seasonal PF 02/28/2015, 04/09/2016, 03/11/2017, 05/19/2018   Influenza,inj,Quad PF,6+ Mos 03/18/2013, 03/07/2014   Influenza,inj,quad, With Preservative 03/16/2020   Pneumococcal Conjugate-13 04/30/2015   Pneumococcal Polysaccharide-23 09/12/2013   Pneumococcal-Unspecified 03/16/2020   Td 11/01/2003, 09/12/2013   Zoster, Live 05/14/2011    TDAP status: Up to date  Flu Vaccine status: Up to date  Pneumococcal vaccine status: Up to date  Covid-19 vaccine status: Declined, Education has been provided regarding the importance of this vaccine but patient still declined. Advised may receive this vaccine at local pharmacy or Health Dept.or vaccine clinic. Aware to provide a copy of the vaccination record if obtained from local pharmacy or Health Dept. Verbalized acceptance and understanding.  Qualifies for Shingles Vaccine? Yes   Zostavax completed No   Shingrix Completed?: No.    Education has been  provided regarding the importance of this vaccine. Patient has been advised to call insurance company to determine out of pocket expense if they have not yet received this vaccine. Advised may also receive vaccine at local pharmacy or Health Dept. Verbalized acceptance and understanding.  Screening Tests Health Maintenance  Topic Date Due   Zoster Vaccines- Shingrix (1 of 2) Never done   INFLUENZA VACCINE  12/31/2021   COLONOSCOPY (Pts 45-75yr Insurance coverage will need to be confirmed)  03/06/2023   TETANUS/TDAP  09/13/2023   Pneumonia Vaccine 76 Years old  Completed   Hepatitis C Screening  Completed   HPV VACCINES  Aged Out   COVID-19 Vaccine  Discontinued    Health Maintenance  Health Maintenance Due  Topic Date Due   Zoster Vaccines- Shingrix (1 of 2) Never done    Colorectal cancer screening: Type of screening: Colonoscopy. Completed 03/05/18. Repeat every 5 years   Additional Screening:  Hepatitis C Screening:  Completed 11/04/16  Vision Screening: Recommended annual ophthalmology exams for early detection of glaucoma and other disorders of the eye. Is the patient up to date with their annual eye  exam?  Yes  Who is the provider or what is the name of the office in which the patient attends annual eye exams? Dr Herbert Deaner  If pt is not established with a provider, would they like to be referred to a provider to establish care? No .   Dental Screening: Recommended annual dental exams for proper oral hygiene  Community Resource Referral / Chronic Care Management: CRR required this visit?  No   CCM required this visit?  No      Plan:     I have personally reviewed and noted the following in the patient's chart:   Medical and social history Use of alcohol, tobacco or illicit drugs  Current medications and supplements including opioid prescriptions. Patient is not currently taking opioid prescriptions. Functional ability and status Nutritional status Physical  activity Advanced directives List of other physicians Hospitalizations, surgeries, and ER visits in previous 12 months Vitals Screenings to include cognitive, depression, and falls Referrals and appointments  In addition, I have reviewed and discussed with patient certain preventive protocols, quality metrics, and best practice recommendations. A written personalized care plan for preventive services as well as general preventive health recommendations were provided to patient.     Willette Brace, LPN   [id number]   Nurse Notes: None

## 2021-11-14 NOTE — Patient Instructions (Signed)
Nicholas Owen , Thank you for taking time to come for your Medicare Wellness Visit. I appreciate your ongoing commitment to your health goals. Please review the following plan we discussed and let me know if I can assist you in the future.   Screening recommendations/referrals: Colonoscopy: Done 03/05/18 repeat every 5 years  Recommended yearly ophthalmology/optometry visit for glaucoma screening and checkup Recommended yearly dental visit for hygiene and checkup  Vaccinations: Influenza vaccine: Done 03/05/21 repeat every year  Pneumococcal vaccine: Up to date Tdap vaccine: Done 09/12/13 repeat every 10 years  Shingles vaccine: Shingrix discussed. Please contact your pharmacy for coverage information.    Covid-19: Discontinued   Advanced directives: Advance directive discussed with you today. Even though you declined this today please call our office should you change your mind and we can give you the proper paperwork for you to fill out.  Conditions/risks identified: lose weight   Next appointment: Follow up in one year for your annual wellness visit.   Preventive Care 64 Years and Older, Male Preventive care refers to lifestyle choices and visits with your health care provider that can promote health and wellness. What does preventive care include? A yearly physical exam. This is also called an annual well check. Dental exams once or twice a year. Routine eye exams. Ask your health care provider how often you should have your eyes checked. Personal lifestyle choices, including: Daily care of your teeth and gums. Regular physical activity. Eating a healthy diet. Avoiding tobacco and drug use. Limiting alcohol use. Practicing safe sex. Taking low doses of aspirin every day. Taking vitamin and mineral supplements as recommended by your health care provider. What happens during an annual well check? The services and screenings done by your health care provider during your annual well  check will depend on your age, overall health, lifestyle risk factors, and family history of disease. Counseling  Your health care provider may ask you questions about your: Alcohol use. Tobacco use. Drug use. Emotional well-being. Home and relationship well-being. Sexual activity. Eating habits. History of falls. Memory and ability to understand (cognition). Work and work Statistician. Screening  You may have the following tests or measurements: Height, weight, and BMI. Blood pressure. Lipid and cholesterol levels. These may be checked every 5 years, or more frequently if you are over 24 years old. Skin check. Lung cancer screening. You may have this screening every year starting at age 42 if you have a 30-pack-year history of smoking and currently smoke or have quit within the past 15 years. Fecal occult blood test (FOBT) of the stool. You may have this test every year starting at age 53. Flexible sigmoidoscopy or colonoscopy. You may have a sigmoidoscopy every 5 years or a colonoscopy every 10 years starting at age 12. Prostate cancer screening. Recommendations will vary depending on your family history and other risks. Hepatitis C blood test. Hepatitis B blood test. Sexually transmitted disease (STD) testing. Diabetes screening. This is done by checking your blood sugar (glucose) after you have not eaten for a while (fasting). You may have this done every 1-3 years. Abdominal aortic aneurysm (AAA) screening. You may need this if you are a current or former smoker. Osteoporosis. You may be screened starting at age 74 if you are at high risk. Talk with your health care provider about your test results, treatment options, and if necessary, the need for more tests. Vaccines  Your health care provider may recommend certain vaccines, such as: Influenza vaccine. This is  recommended every year. Tetanus, diphtheria, and acellular pertussis (Tdap, Td) vaccine. You may need a Td booster  every 10 years. Zoster vaccine. You may need this after age 79. Pneumococcal 13-valent conjugate (PCV13) vaccine. One dose is recommended after age 18. Pneumococcal polysaccharide (PPSV23) vaccine. One dose is recommended after age 51. Talk to your health care provider about which screenings and vaccines you need and how often you need them. This information is not intended to replace advice given to you by your health care provider. Make sure you discuss any questions you have with your health care provider. Document Released: 06/15/2015 Document Revised: 02/06/2016 Document Reviewed: 03/20/2015 Elsevier Interactive Patient Education  2017 [place] Prevention in the Home Falls can cause injuries. They can happen to people of all ages. There are many things you can do to make your home safe and to help prevent falls. What can I do on the outside of my home? Regularly fix the edges of walkways and driveways and fix any cracks. Remove anything that might make you trip as you walk through a door, such as a raised step or threshold. Trim any bushes or trees on the path to your home. Use bright outdoor lighting. Clear any walking paths of anything that might make someone trip, such as rocks or tools. Regularly check to see if handrails are loose or broken. Make sure that both sides of any steps have handrails. Any raised decks and porches should have guardrails on the edges. Have any leaves, snow, or ice cleared regularly. Use sand or salt on walking paths during winter. Clean up any spills in your garage right away. This includes oil or grease spills. What can I do in the bathroom? Use night lights. Install grab bars by the toilet and in the tub and shower. Do not use towel bars as grab bars. Use non-skid mats or decals in the tub or shower. If you need to sit down in the shower, use a plastic, non-slip stool. Keep the floor dry. Clean up any water that spills on the floor as soon  as it happens. Remove soap buildup in the tub or shower regularly. Attach bath mats securely with double-sided non-slip rug tape. Do not have throw rugs and other things on the floor that can make you trip. What can I do in the bedroom? Use night lights. Make sure that you have a light by your bed that is easy to reach. Do not use any sheets or blankets that are too big for your bed. They should not hang down onto the floor. Have a firm chair that has side arms. You can use this for support while you get dressed. Do not have throw rugs and other things on the floor that can make you trip. What can I do in the kitchen? Clean up any spills right away. Avoid walking on wet floors. Keep items that you use a lot in easy-to-reach places. If you need to reach something above you, use a strong step stool that has a grab bar. Keep electrical cords out of the way. Do not use floor polish or wax that makes floors slippery. If you must use wax, use non-skid floor wax. Do not have throw rugs and other things on the floor that can make you trip. What can I do with my stairs? Do not leave any items on the stairs. Make sure that there are handrails on both sides of the stairs and use them. Fix handrails that are  broken or loose. Make sure that handrails are as long as the stairways. Check any carpeting to make sure that it is firmly attached to the stairs. Fix any carpet that is loose or worn. Avoid having throw rugs at the top or bottom of the stairs. If you do have throw rugs, attach them to the floor with carpet tape. Make sure that you have a light switch at the top of the stairs and the bottom of the stairs. If you do not have them, ask someone to add them for you. What else can I do to help prevent falls? Wear shoes that: Do not have high heels. Have rubber bottoms. Are comfortable and fit you well. Are closed at the toe. Do not wear sandals. If you use a stepladder: Make sure that it is fully  opened. Do not climb a closed stepladder. Make sure that both sides of the stepladder are locked into place. Ask someone to hold it for you, if possible. Clearly mark and make sure that you can see: Any grab bars or handrails. First and last steps. Where the edge of each step is. Use tools that help you move around (mobility aids) if they are needed. These include: Canes. Walkers. Scooters. Crutches. Turn on the lights when you go into a dark area. Replace any light bulbs as soon as they burn out. Set up your furniture so you have a clear path. Avoid moving your furniture around. If any of your floors are uneven, fix them. If there are any pets around you, be aware of where they are. Review your medicines with your doctor. Some medicines can make you feel dizzy. This can increase your chance of falling. Ask your doctor what other things that you can do to help prevent falls. This information is not intended to replace advice given to you by your health care provider. Make sure you discuss any questions you have with your health care provider. Document Released: 03/15/2009 Document Revised: 10/25/2015 Document Reviewed: 06/23/2014 Elsevier Interactive Patient Education  2017 [employer].

## 2021-11-22 ENCOUNTER — Ambulatory Visit: Payer: Medicare HMO | Admitting: Hematology and Oncology

## 2021-11-25 ENCOUNTER — Other Ambulatory Visit: Payer: Self-pay | Admitting: Family Medicine

## 2021-11-25 DIAGNOSIS — Z7901 Long term (current) use of anticoagulants: Secondary | ICD-10-CM

## 2021-11-26 ENCOUNTER — Ambulatory Visit (INDEPENDENT_AMBULATORY_CARE_PROVIDER_SITE_OTHER): Payer: Medicare HMO

## 2021-11-26 DIAGNOSIS — Z7901 Long term (current) use of anticoagulants: Secondary | ICD-10-CM

## 2021-11-26 LAB — POCT INR: INR: 2.3 (ref 2.0–3.0)

## 2021-12-04 DIAGNOSIS — R9721 Rising PSA following treatment for malignant neoplasm of prostate: Secondary | ICD-10-CM | POA: Diagnosis not present

## 2021-12-10 DIAGNOSIS — R9721 Rising PSA following treatment for malignant neoplasm of prostate: Secondary | ICD-10-CM | POA: Diagnosis not present

## 2021-12-10 DIAGNOSIS — N393 Stress incontinence (female) (male): Secondary | ICD-10-CM | POA: Diagnosis not present

## 2021-12-10 DIAGNOSIS — C61 Malignant neoplasm of prostate: Secondary | ICD-10-CM | POA: Diagnosis not present

## 2021-12-10 DIAGNOSIS — N5231 Erectile dysfunction following radical prostatectomy: Secondary | ICD-10-CM | POA: Diagnosis not present

## 2022-01-07 ENCOUNTER — Ambulatory Visit (INDEPENDENT_AMBULATORY_CARE_PROVIDER_SITE_OTHER): Payer: Medicare HMO

## 2022-01-07 ENCOUNTER — Ambulatory Visit: Payer: Medicare HMO

## 2022-01-07 DIAGNOSIS — Z7901 Long term (current) use of anticoagulants: Secondary | ICD-10-CM

## 2022-01-07 LAB — POCT INR: INR: 2.5 (ref 2.0–3.0)

## 2022-01-07 NOTE — Progress Notes (Signed)
Continue 1 tablet daily except take 1/2 on Wednesdays and Sundays. Recheck in 6 weeks.

## 2022-01-07 NOTE — Patient Instructions (Addendum)
Pre visit review using our clinic review tool, if applicable. No additional management support is needed unless otherwise documented below in the visit note.  Continue 1 tablet daily except take 1/2 on Wednesdays and Sundays. Recheck in 6 weeks.

## 2022-01-30 DIAGNOSIS — M170 Bilateral primary osteoarthritis of knee: Secondary | ICD-10-CM | POA: Diagnosis not present

## 2022-01-30 DIAGNOSIS — M25561 Pain in right knee: Secondary | ICD-10-CM | POA: Diagnosis not present

## 2022-01-30 DIAGNOSIS — M25562 Pain in left knee: Secondary | ICD-10-CM | POA: Diagnosis not present

## 2022-02-11 ENCOUNTER — Ambulatory Visit (INDEPENDENT_AMBULATORY_CARE_PROVIDER_SITE_OTHER): Payer: Medicare HMO

## 2022-02-11 DIAGNOSIS — Z7901 Long term (current) use of anticoagulants: Secondary | ICD-10-CM | POA: Diagnosis not present

## 2022-02-11 LAB — POCT INR: INR: 2.4 (ref 2.0–3.0)

## 2022-02-11 NOTE — Progress Notes (Signed)
Continue 1 tablet daily except take 1/2 on Wednesdays and Sundays. Recheck in 6 weeks.

## 2022-02-11 NOTE — Patient Instructions (Addendum)
Pre visit review using our clinic review tool, if applicable. No additional management support is needed unless otherwise documented below in the visit note.  Continue 1 tablet daily except take 1/2 on Wednesdays and Sundays. Recheck in 6 weeks.

## 2022-02-18 ENCOUNTER — Ambulatory Visit: Payer: Medicare HMO

## 2022-02-18 ENCOUNTER — Telehealth: Payer: Self-pay

## 2022-02-18 NOTE — Telephone Encounter (Signed)
Pt is scheduled for coumadin clinic apt on 10/24 at 8 am. There will not be a schedule for the coumadin clinic that day so, apt will need to be RS. LVM for pt to return call to RS. Can RS for 10/27 at 8 am or 10/31 at 8:30 or 10/17 at 8 am.

## 2022-02-24 ENCOUNTER — Encounter: Payer: Self-pay | Admitting: *Deleted

## 2022-03-13 DIAGNOSIS — H5203 Hypermetropia, bilateral: Secondary | ICD-10-CM | POA: Diagnosis not present

## 2022-03-13 DIAGNOSIS — H2513 Age-related nuclear cataract, bilateral: Secondary | ICD-10-CM | POA: Diagnosis not present

## 2022-03-25 ENCOUNTER — Ambulatory Visit: Payer: Medicare HMO

## 2022-03-28 ENCOUNTER — Ambulatory Visit (INDEPENDENT_AMBULATORY_CARE_PROVIDER_SITE_OTHER): Payer: Medicare HMO

## 2022-03-28 ENCOUNTER — Ambulatory Visit: Payer: Medicare HMO

## 2022-03-28 DIAGNOSIS — Z7901 Long term (current) use of anticoagulants: Secondary | ICD-10-CM | POA: Diagnosis not present

## 2022-03-28 LAB — POCT INR: INR: 2.4 (ref 2.0–3.0)

## 2022-03-28 NOTE — Progress Notes (Signed)
Continue 1 tablet daily except take 1/2 tablet on Wednesdays and Sundays. Recheck in 6 weeks.  

## 2022-03-28 NOTE — Patient Instructions (Addendum)
Continue 1 tablet daily except take 1/2 tablet on Wednesdays and Sundays. Recheck in 6 weeks.  

## 2022-04-09 ENCOUNTER — Ambulatory Visit (INDEPENDENT_AMBULATORY_CARE_PROVIDER_SITE_OTHER): Payer: Medicare HMO

## 2022-04-09 DIAGNOSIS — Z23 Encounter for immunization: Secondary | ICD-10-CM

## 2022-05-06 ENCOUNTER — Telehealth: Payer: Self-pay | Admitting: Gastroenterology

## 2022-05-06 NOTE — Telephone Encounter (Signed)
Inbound call from patient calling to see when his next colonoscopy is due. I advised patient that he is not due until October of 2024. Patient stated that he understood that he was on the 5 year recall and stated that he was a little concerned due to having his prostate being taking out about 9 years ago. Patient stated that he has seen the urologist and they have determined that he still has cancer but not sure where at. Patient is requesting a call back to discuss. Please advise.

## 2022-05-06 NOTE — Telephone Encounter (Signed)
Lm on vm for patient to return call 

## 2022-05-06 NOTE — Telephone Encounter (Signed)
Based on family history, last colonoscopy findings and current guidelines, Nicholas Owen's next scheduled colonoscopy for polyp surveillance will be October 2024, a 5-year interval from his last exam.  If this patient or one of his other physicians has concerns with the need for additional record review, please make a clinic appointment for him.  HD

## 2022-05-06 NOTE — Telephone Encounter (Signed)
[facility] and requested Medical Records department. I left a vm for medical records requesting that they fax over the patient's latest office note to our office for review. I left the office phone # in case they had any questions or concerns.

## 2022-05-06 NOTE — Telephone Encounter (Signed)
Nicholas Owen from [employer] records called and asked that we send a fax request for continuity of care. Nicholas Owen asked that I fax request to her at [phone]. Request faxed to [facility].

## 2022-05-09 ENCOUNTER — Ambulatory Visit (INDEPENDENT_AMBULATORY_CARE_PROVIDER_SITE_OTHER): Payer: Medicare HMO

## 2022-05-09 DIAGNOSIS — Z7901 Long term (current) use of anticoagulants: Secondary | ICD-10-CM | POA: Diagnosis not present

## 2022-05-09 LAB — POCT INR: INR: 2.3 (ref 2.0–3.0)

## 2022-05-09 NOTE — Patient Instructions (Addendum)
Pre visit review using our clinic review tool, if applicable. No additional management support is needed unless otherwise documented below in the visit note.  Continue 1 tablet daily except take 1/2 tablet on Wednesdays and Sundays. Recheck in 6 weeks.  

## 2022-05-09 NOTE — Telephone Encounter (Signed)
Pt returned call. We reviewed Dr. Loletha Carrow' recommendations for recall as outlined below. Pt states that he still has some concerns due to his family hx of colon cancer. He states that his PSA level is still elevated although he had his prostate removed. I offered to schedule patient for an office visit to further discuss his concerns with Dr. Loletha Carrow but patient states that he will just wait until 03/2023. Pt is aware that he will receive a reminder letter when he is due. Pt verbalized understanding.

## 2022-05-09 NOTE — Telephone Encounter (Signed)
Lm on vm for patient to return call to further discuss recall date.

## 2022-05-09 NOTE — Progress Notes (Signed)
Continue 1 tablet daily except take 1/2 tablet on Wednesdays and Sundays. Recheck in 6 weeks.  

## 2022-05-14 DIAGNOSIS — M170 Bilateral primary osteoarthritis of knee: Secondary | ICD-10-CM | POA: Diagnosis not present

## 2022-05-21 DIAGNOSIS — M25561 Pain in right knee: Secondary | ICD-10-CM | POA: Diagnosis not present

## 2022-05-21 DIAGNOSIS — M25562 Pain in left knee: Secondary | ICD-10-CM | POA: Diagnosis not present

## 2022-05-21 DIAGNOSIS — M170 Bilateral primary osteoarthritis of knee: Secondary | ICD-10-CM | POA: Diagnosis not present

## 2022-05-28 DIAGNOSIS — M170 Bilateral primary osteoarthritis of knee: Secondary | ICD-10-CM | POA: Insufficient documentation

## 2022-06-05 ENCOUNTER — Telehealth: Payer: Self-pay | Admitting: Family Medicine

## 2022-06-05 NOTE — Telephone Encounter (Signed)
Please schedule virtual to discuss with Dr. Yong Channel.

## 2022-06-05 NOTE — Telephone Encounter (Signed)
Pt tested + for Covid 3 days ago. Has been taking [employer] brand Severe Cold & Flu which contains acetaminophen. Pt currently takes Warfarin. Pt is wanting to know if he should not be taking the over the counter medication. Please advise

## 2022-06-05 NOTE — Telephone Encounter (Signed)
May this be with any provider o does it need to be with PCP? If needs to be with PCP, where can patient be worked in? PCP isn't available until 06/16/22. Please advise.

## 2022-06-05 NOTE — Telephone Encounter (Signed)
Any is fine.

## 2022-06-06 ENCOUNTER — Telehealth (INDEPENDENT_AMBULATORY_CARE_PROVIDER_SITE_OTHER): Payer: Medicare HMO | Admitting: Family Medicine

## 2022-06-06 DIAGNOSIS — U071 COVID-19: Secondary | ICD-10-CM | POA: Diagnosis not present

## 2022-06-06 NOTE — Progress Notes (Signed)
Virtual Visit via Video Note  I connected with Nicholas Owen on 06/06/2022 at  8:40 AM EST by a video enabled telemedicine application and verified that I am speaking with the correct person using two identifiers.  Location: Patient: Home Provider: Office   I discussed the limitations of evaluation and management by telemedicine and the availability of in person appointments. The patient expressed understanding and agreed to proceed.  History of Present Illness: Problem 70: The 77 year old male patient reports having tested positive for COVID-19 four days prior to the encounter and states that symptoms including cough and mucus have been present for approximately one week. The patient has been self-managing with an over-the-counter cold and flu medication but noticed a warning regarding warfarin. The patient currently takes warfarin due to a history of blood clots.   Review of Systems:  Respiratory: Positive for cough, negative for shortness of breath or significant chest pain. All other systems: The review of systems was otherwise negative.   Observations/Objective: There were no vitals filed for this visit.   Gen: NAD, resting comfortably HEENT: EOMI Pulm: NWOB Skin: no rash on face Neuro: no facial asymmetry or dysmetria Psych: Normal affect   Assessment and Plan: Problem List Items Addressed This Visit   None Visit Diagnoses     COVID-19    -  Primary     Plan: No specific antiviral therapy indicated at this point due to being outside the optimal window for treatment initiation. Advised to continue supportive care and monitoring symptoms. Recommended staying hydrated, rest, and continued use of over-the-counter cold and flu medications as tolerated. I verified that the ingredients in the cold and flu medication are unlikely to interact significantly with warfarin, but advised the patient to use it sparingly and decrease the dosage as symptoms improve.  There are no  discontinued medications.   Follow Up Instructions:     I discussed the assessment and treatment plan with the patient. The patient was provided an opportunity to ask questions and all were answered. The patient agreed with the plan and demonstrated an understanding of the instructions.   The patient was advised to call back or seek an in-person evaluation if the symptoms worsen or if the condition fails to improve as anticipated.  I provided 25 minutes of non-face-to-face time during this encounter.   Garnette Gunner, MD

## 2022-06-10 DIAGNOSIS — R9721 Rising PSA following treatment for malignant neoplasm of prostate: Secondary | ICD-10-CM | POA: Diagnosis not present

## 2022-06-17 DIAGNOSIS — N393 Stress incontinence (female) (male): Secondary | ICD-10-CM | POA: Diagnosis not present

## 2022-06-17 DIAGNOSIS — C61 Malignant neoplasm of prostate: Secondary | ICD-10-CM | POA: Diagnosis not present

## 2022-06-17 DIAGNOSIS — N5231 Erectile dysfunction following radical prostatectomy: Secondary | ICD-10-CM | POA: Diagnosis not present

## 2022-06-20 ENCOUNTER — Ambulatory Visit (INDEPENDENT_AMBULATORY_CARE_PROVIDER_SITE_OTHER): Payer: Medicare HMO

## 2022-06-20 DIAGNOSIS — Z7901 Long term (current) use of anticoagulants: Secondary | ICD-10-CM

## 2022-06-20 LAB — POCT INR: INR: 2.7 (ref 2.0–3.0)

## 2022-06-20 NOTE — Patient Instructions (Addendum)
Pre visit review using our clinic review tool, if applicable. No additional management support is needed unless otherwise documented below in the visit note.  Continue 1 tablet daily except take 1/2 tablet on Wednesdays and Sundays. Recheck in 6 weeks.  

## 2022-06-20 NOTE — Progress Notes (Signed)
Continue 1 tablet daily except take 1/2 tablet on Wednesdays and Sundays. Recheck in 6 weeks.  

## 2022-08-01 ENCOUNTER — Ambulatory Visit (INDEPENDENT_AMBULATORY_CARE_PROVIDER_SITE_OTHER): Payer: Medicare HMO

## 2022-08-01 DIAGNOSIS — Z7901 Long term (current) use of anticoagulants: Secondary | ICD-10-CM

## 2022-08-01 DIAGNOSIS — D6859 Other primary thrombophilia: Secondary | ICD-10-CM

## 2022-08-01 LAB — POCT INR: INR: 2.9 (ref 2.0–3.0)

## 2022-08-01 NOTE — Progress Notes (Signed)
Continue 1 tablet daily except take 1/2 tablet on Wednesdays and Sundays. Recheck in 6 weeks.

## 2022-08-01 NOTE — Patient Instructions (Addendum)
Pre visit review using our clinic review tool, if applicable. No additional management support is needed unless otherwise documented below in the visit note.  Continue 1 tablet daily except take 1/2 tablet on Wednesdays and Sundays. Recheck in 6 weeks.

## 2022-08-21 ENCOUNTER — Other Ambulatory Visit: Payer: Self-pay | Admitting: Family Medicine

## 2022-09-03 ENCOUNTER — Other Ambulatory Visit: Payer: Self-pay | Admitting: Family Medicine

## 2022-09-03 DIAGNOSIS — Z7901 Long term (current) use of anticoagulants: Secondary | ICD-10-CM

## 2022-09-12 ENCOUNTER — Ambulatory Visit (INDEPENDENT_AMBULATORY_CARE_PROVIDER_SITE_OTHER): Payer: Medicare HMO

## 2022-09-12 DIAGNOSIS — Z7901 Long term (current) use of anticoagulants: Secondary | ICD-10-CM | POA: Diagnosis not present

## 2022-09-12 LAB — POCT INR: INR: 2.1 (ref 2.0–3.0)

## 2022-09-12 NOTE — Progress Notes (Signed)
Continue 1 tablet daily except take 1/2 tablet on Wednesdays and Sundays. Recheck in 6 weeks.  

## 2022-09-12 NOTE — Patient Instructions (Addendum)
Pre visit review using our clinic review tool, if applicable. No additional management support is needed unless otherwise documented below in the visit note.  Continue 1 tablet daily except take 1/2 tablet on Wednesdays and Sundays. Recheck in 6 weeks.  

## 2022-10-21 ENCOUNTER — Ambulatory Visit (INDEPENDENT_AMBULATORY_CARE_PROVIDER_SITE_OTHER): Payer: Medicare HMO

## 2022-10-21 DIAGNOSIS — Z7901 Long term (current) use of anticoagulants: Secondary | ICD-10-CM | POA: Diagnosis not present

## 2022-10-21 LAB — POCT INR: INR: 2.6 (ref 2.0–3.0)

## 2022-10-21 NOTE — Progress Notes (Signed)
Continue 1 tablet daily except take 1/2 tablet on Wednesdays and Sundays. Recheck in 6 weeks.  

## 2022-10-21 NOTE — Patient Instructions (Addendum)
Pre visit review using our clinic review tool, if applicable. No additional management support is needed unless otherwise documented below in the visit note.  Continue 1 tablet daily except take 1/2 tablet on Wednesdays and Sundays. Recheck in 6 weeks.  

## 2022-10-30 DIAGNOSIS — R55 Syncope and collapse: Secondary | ICD-10-CM | POA: Diagnosis not present

## 2022-10-30 DIAGNOSIS — R7303 Prediabetes: Secondary | ICD-10-CM | POA: Diagnosis not present

## 2022-10-30 DIAGNOSIS — R0609 Other forms of dyspnea: Secondary | ICD-10-CM | POA: Diagnosis not present

## 2022-10-30 DIAGNOSIS — I2782 Chronic pulmonary embolism: Secondary | ICD-10-CM | POA: Diagnosis not present

## 2022-10-30 DIAGNOSIS — I2602 Saddle embolus of pulmonary artery with acute cor pulmonale: Secondary | ICD-10-CM | POA: Diagnosis not present

## 2022-11-06 ENCOUNTER — Telehealth: Payer: Self-pay

## 2022-11-06 NOTE — Telephone Encounter (Signed)
Pt requested to [facility] apt on 7/2. RS for 7/2 at 1pm. Pt has a PCP apt in the morning.

## 2022-12-02 ENCOUNTER — Ambulatory Visit: Payer: Medicare HMO

## 2022-12-02 ENCOUNTER — Ambulatory Visit (INDEPENDENT_AMBULATORY_CARE_PROVIDER_SITE_OTHER): Payer: Medicare HMO | Admitting: Family Medicine

## 2022-12-02 ENCOUNTER — Encounter: Payer: Self-pay | Admitting: Family Medicine

## 2022-12-02 ENCOUNTER — Ambulatory Visit (INDEPENDENT_AMBULATORY_CARE_PROVIDER_SITE_OTHER): Payer: Medicare HMO

## 2022-12-02 VITALS — BP 100/60 | HR 74 | Temp 98.0°F | Ht 72.0 in | Wt 251.6 lb

## 2022-12-02 DIAGNOSIS — I2692 Saddle embolus of pulmonary artery without acute cor pulmonale: Secondary | ICD-10-CM | POA: Diagnosis not present

## 2022-12-02 DIAGNOSIS — I2782 Chronic pulmonary embolism: Secondary | ICD-10-CM

## 2022-12-02 DIAGNOSIS — R739 Hyperglycemia, unspecified: Secondary | ICD-10-CM | POA: Diagnosis not present

## 2022-12-02 DIAGNOSIS — Z7901 Long term (current) use of anticoagulants: Secondary | ICD-10-CM | POA: Diagnosis not present

## 2022-12-02 DIAGNOSIS — E785 Hyperlipidemia, unspecified: Secondary | ICD-10-CM | POA: Diagnosis not present

## 2022-12-02 DIAGNOSIS — D6862 Lupus anticoagulant syndrome: Secondary | ICD-10-CM | POA: Diagnosis not present

## 2022-12-02 DIAGNOSIS — Z8546 Personal history of malignant neoplasm of prostate: Secondary | ICD-10-CM | POA: Diagnosis not present

## 2022-12-02 DIAGNOSIS — Z131 Encounter for screening for diabetes mellitus: Secondary | ICD-10-CM

## 2022-12-02 LAB — COMPREHENSIVE METABOLIC PANEL
ALT: 25 U/L (ref 0–53)
AST: 22 U/L (ref 0–37)
Albumin: 4.2 g/dL (ref 3.5–5.2)
Alkaline Phosphatase: 59 U/L (ref 39–117)
BUN: 14 mg/dL (ref 6–23)
CO2: 27 mEq/L (ref 19–32)
Calcium: 9.7 mg/dL (ref 8.4–10.5)
Chloride: 106 mEq/L (ref 96–112)
Creatinine, Ser: 0.99 mg/dL (ref 0.40–1.50)
GFR: 74.56 mL/min (ref >60.00)
Glucose, Bld: 90 mg/dL (ref 70–99)
Potassium: 5.3 mEq/L — ABNORMAL HIGH (ref 3.5–5.1)
Sodium: 140 mEq/L (ref 135–145)
Total Bilirubin: 1.4 mg/dL — ABNORMAL HIGH (ref 0.2–1.2)
Total Protein: 6.5 g/dL (ref 6.0–8.3)

## 2022-12-02 LAB — CBC WITH DIFFERENTIAL/PLATELET
Basophils Absolute: 0 10*3/uL (ref 0.0–0.1)
Basophils Relative: 0.7 % (ref 0.0–3.0)
Eosinophils Absolute: 0.2 10*3/uL (ref 0.0–0.7)
Eosinophils Relative: 2.8 % (ref 0.0–5.0)
HCT: 44.1 % (ref 39.0–52.0)
Hemoglobin: 14.7 g/dL (ref 13.0–17.0)
Lymphocytes Relative: 30.7 % (ref 12.0–46.0)
Lymphs Abs: 1.9 10*3/uL (ref 0.7–4.0)
MCHC: 33.3 g/dL (ref 30.0–36.0)
MCV: 86.3 fl (ref 78.0–100.0)
Monocytes Absolute: 0.7 10*3/uL (ref 0.1–1.0)
Monocytes Relative: 10.7 % (ref 3.0–12.0)
Neutro Abs: 3.4 10*3/uL (ref 1.4–7.7)
Neutrophils Relative %: 55.1 % (ref 43.0–77.0)
Platelets: 284 10*3/uL (ref 150.0–400.0)
RBC: 5.11 Mil/uL (ref 4.22–5.81)
RDW: 15.1 % (ref 11.5–15.5)
WBC: 6.1 10*3/uL (ref 4.0–10.5)

## 2022-12-02 LAB — LIPID PANEL
Cholesterol: 122 mg/dL (ref 0–200)
HDL: 36.2 mg/dL — ABNORMAL LOW (ref >39.00)
LDL Cholesterol: 61 mg/dL (ref 0–99)
NonHDL: 85.44
Total CHOL/HDL Ratio: 3
Triglycerides: 122 mg/dL (ref 0.0–149.0)
VLDL: 24.4 mg/dL (ref 0.0–40.0)

## 2022-12-02 LAB — POCT INR: INR: 2.2 (ref 2.0–3.0)

## 2022-12-02 LAB — HEMOGLOBIN A1C: Hgb A1c MFr Bld: 5.9 % (ref 4.6–6.5)

## 2022-12-02 NOTE — Progress Notes (Signed)
Continue 1 tablet daily except take 1/2 tablet on Wednesdays and Sundays. Recheck in 6 weeks.  

## 2022-12-02 NOTE — Progress Notes (Signed)
Phone [phone] In person visit   Subjective:   Nicholas Owen is a 77 y.o. year old very pleasant male patient who presents for/with See problem oriented charting Chief Complaint  Patient presents with   ER f/u    Pt is here to f/u from ER visit and he is feeling better.    Past Medical History-  Patient Active Problem List   Diagnosis Date Noted   Lupus anticoagulant disorder (HCC) 04/30/2015    Priority: High   History of DVT of lower extremity 08/31/2012    Priority: High   Chronic pulmonary embolism (HCC) 04/13/2012    Priority: High   History of prostate cancer 05/14/2011    Priority: High   Diverticulitis 05/19/2018    Priority: Medium    Urinary incontinence 09/20/2014    Priority: Medium    Hyperglycemia 05/12/2012    Priority: Medium    GERD 10/04/2007    Priority: Medium    Hyperlipidemia 03/08/2007    Priority: Medium    Diverticulosis 03/22/2014    Priority: Low   Erectile dysfunction 03/22/2014    Priority: Low   Personal history of colonic polyps 08/31/2012    Priority: Low   Basal cell carcinoma     Priority: Low   Obesity 10/08/2011    Priority: Low   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   Primary hypercoagulable state (HCC) 10/31/2020   Pain in joint of left knee 06/15/2020    Medications- reviewed and updated Current Outpatient Medications  Medication Sig Dispense Refill   cholecalciferol (VITAMIN D3) 25 MCG (1000 UNIT) tablet Take 2,000 Units by mouth daily.     co-enzyme Q-10 50 MG capsule Take 200 mg by mouth daily.     Multiple Vitamins-Minerals (MULTIVITAMIN WITH MINERALS) tablet Take 1 tablet by mouth daily.     Multiple Vitamins-Minerals (ZINC PO) Take 1 tablet by mouth 1 day or 1 dose.     omeprazole (PRILOSEC) 20 MG capsule TAKE 1 CAPSULE  EVERY DAY 90 capsule 3   rosuvastatin (CRESTOR) 10 MG tablet TAKE 1 TABLET EVERY DAY 90 tablet 3   warfarin (COUMADIN) 5 MG tablet TAKE 1 TABLET DAILY OR AS DIRECTED BY ANTICOAGULATION CLINIC 90 tablet 3   No current facility-administered medications for this visit.     Objective:  BP 100/60   Pulse 74   Temp 98 F (36.7 C)   Ht 6' (1.829 m)   Wt 251 lb 9.6 oz (114.1 kg)   SpO2 96%   BMI 34.12 kg/m  Gen: NAD, resting comfortably CV: RRR no murmurs rubs or gallops Lungs: CTAB no crackles, wheeze, rhonchi Ext: trace edema Skin: warm, dry     Assessment and Plan   # Urgent care follow up for  shortness of breath, weakness, nausea, vomiting after a syncopal episode after loss of appetite for 3 days S: Patient presented to [facility] urgent care after having a syncopal episode 3 days prior to visit followed by nausea and vomiting-unconscious for unknown period of time.  He continued to have shortness of breath, fever, chills, malaise, lightheadedness.  Fortunately no chest pain.  Notes state that he was referred to the emergency department for further workup and evaluation including EKG in particular with history of chronic saddle pulmonary embolism  as well as SVT.  Unfortunately no labs are available from that visit and I do not see the actual emergency department encounter in epic.  Today patient reports, he ended up not going of the the emergency room- had waited several hours already in urgent care. He states he was feeling about 75% better at that point. He states in total episode lasted 5 days- feels he got dehydrated out working in the yard with the heat- then he felt faint (not sure if fainted) but did then note emesis in front of him (he reports out very short period of time if he did actually faint- he is not sure that he did faint). 5 day episode of not feeling very well for a few days- temperature up to 101. Nausea resolved after episode. He reports after 5 days recovered- he  has been at about 100% for last month and has felt well A/P: unclear what caused this episode but he is back at baseline- we opted to update baseline labs. We did discuss could do cardiac workup but he feels this was just illness related and has been able to get back out working in yard with no chest pain or shortness of breath so wants to hold off. Wonder if this could have been COVID even (but tested negative at home once 2nd or 3rd day) -did consider PULMONARY EMBOLISM but with resolution and consistency with coumadin (visit this afternoon) think far less likely  # Chronic pulmonary embolism/DVT in patient with lupus anticoagulant disorder # History of right heart strain due to submassive saddle pulmonary embolism previously-echocardiogram later showed resolution along with VQ scan S:Medication: Remains on long-term Coumadin monitored by [facility] -Patient requires Lovenox bridge if he is to have procedure/surgery.  Previously seen by pulmonary who has released him. -per hematology "During his last visit, I have recommended transition to coumadin since DOAC's are inferior to coumadin in [place] (lupus anticoagulant)/APLS He was able to successfully transition to Coumadin with his primary care physician" A/P: chronic pulmonary embolism stable on coumadin- on this due to lupus anticoagulant disorder   # History of prostate cancer status post prostatectomy in 2013-  reports recurrence of cancer in 2023- they are monitoring psa  #hyperlipidemia S: Medication:Rosuvastatin 10 mg daily along with coenzyme q10 Lab Results  Component Value Date   CHOL 144 08/31/2020   HDL 40.50 08/31/2020   LDLCALC 82 08/31/2020   LDLDIRECT 78 02/29/2020   TRIG 108.0 08/31/2020   CHOLHDL 4 08/31/2020  A/P: close to ideal #s last visit- update with labs today  # Hyperglycemia/insulin resistance/prediabetes S:  Medication: none Exercise and diet- active in yard and gym 3 days a week at [employer]. Down 8 lbs  over last 18 months- trying to lose mild amount of weight- trying to eat better Lab Results  Component Value Date   HGBA1C 6.2 03/05/2021   HGBA1C 6.1 08/31/2020   HGBA1C 6.0 (H) 02/29/2020  A/P: hopefully stable- or improved- update a1c today. Continue current meds for now   Recommended follow up: Return in about 6 months (around 06/04/2023) for physical or sooner if needed.Schedule b4 you leave. Future Appointments  Date Time Provider Department Center  12/02/2022  1:00 PM [facility] None   Lab/Order associations:   ICD-10-CM   1. 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 Lupus anticoagulant disorder (HCC) Chronic D68.62     3. History of  prostate cancer  Z85.46     4. Hyperglycemia  R73.9 HgB A1c    5. Hyperlipidemia, unspecified hyperlipidemia type  E78.5 Comprehensive metabolic panel    CBC with Differential/Platelet    Lipid panel    6. Screening for diabetes mellitus  Z13.1 HgB A1c     No orders of the defined types were placed in this encounter.  Return precautions advised.  Tana Conch, MD

## 2022-12-02 NOTE — Patient Instructions (Addendum)
Pre visit review using our clinic review tool, if applicable. No additional management support is needed unless otherwise documented below in the visit note.  Continue 1 tablet daily except take 1/2 tablet on Wednesdays and Sundays. Recheck in 6 weeks.  

## 2022-12-02 NOTE — Patient Instructions (Addendum)
Please stop by lab before you go If you have mychart- we will send your results within 3 business days of [place] receiving them.  If you do not have mychart- we will call you about results within 5 business days of [place] receiving them.  *please also note that you will see labs on mychart as soon as they post. I will later go in and write notes on them- will say "notes from Dr. Durene Cal"   Glad you are doing better- if recurrence of symptoms please see [place] back as soon as possible or seek care  Recommended follow up: Return in about 6 months (around 06/04/2023) for physical or sooner if needed.Schedule b4 you leave.

## 2022-12-09 DIAGNOSIS — R9721 Rising PSA following treatment for malignant neoplasm of prostate: Secondary | ICD-10-CM | POA: Diagnosis not present

## 2022-12-09 DIAGNOSIS — Z8546 Personal history of malignant neoplasm of prostate: Secondary | ICD-10-CM | POA: Diagnosis not present

## 2022-12-16 DIAGNOSIS — N393 Stress incontinence (female) (male): Secondary | ICD-10-CM | POA: Diagnosis not present

## 2022-12-31 ENCOUNTER — Encounter: Payer: Self-pay | Admitting: Physician Assistant

## 2022-12-31 ENCOUNTER — Ambulatory Visit (INDEPENDENT_AMBULATORY_CARE_PROVIDER_SITE_OTHER): Payer: Medicare HMO | Admitting: Physician Assistant

## 2022-12-31 ENCOUNTER — Encounter (HOSPITAL_BASED_OUTPATIENT_CLINIC_OR_DEPARTMENT_OTHER): Payer: Self-pay | Admitting: Emergency Medicine

## 2022-12-31 ENCOUNTER — Encounter (INDEPENDENT_AMBULATORY_CARE_PROVIDER_SITE_OTHER): Payer: Self-pay

## 2022-12-31 ENCOUNTER — Emergency Department (HOSPITAL_BASED_OUTPATIENT_CLINIC_OR_DEPARTMENT_OTHER)
Admission: EM | Admit: 2022-12-31 | Discharge: 2022-12-31 | Disposition: A | Discharge status: Home / Self Care | Payer: Medicare HMO | Attending: Emergency Medicine | Admitting: Emergency Medicine

## 2022-12-31 ENCOUNTER — Emergency Department (HOSPITAL_BASED_OUTPATIENT_CLINIC_OR_DEPARTMENT_OTHER): Payer: Medicare HMO

## 2022-12-31 ENCOUNTER — Other Ambulatory Visit: Payer: Self-pay

## 2022-12-31 VITALS — BP 108/70 | HR 81 | Temp 97.5°F | Ht 72.0 in | Wt 244.2 lb

## 2022-12-31 DIAGNOSIS — Z85828 Personal history of other malignant neoplasm of skin: Secondary | ICD-10-CM | POA: Insufficient documentation

## 2022-12-31 DIAGNOSIS — R531 Weakness: Secondary | ICD-10-CM

## 2022-12-31 DIAGNOSIS — R918 Other nonspecific abnormal finding of lung field: Secondary | ICD-10-CM | POA: Diagnosis not present

## 2022-12-31 DIAGNOSIS — R9431 Abnormal electrocardiogram [ECG] [EKG]: Secondary | ICD-10-CM | POA: Diagnosis not present

## 2022-12-31 DIAGNOSIS — J168 Pneumonia due to other specified infectious organisms: Secondary | ICD-10-CM | POA: Diagnosis not present

## 2022-12-31 DIAGNOSIS — K449 Diaphragmatic hernia without obstruction or gangrene: Secondary | ICD-10-CM | POA: Diagnosis not present

## 2022-12-31 DIAGNOSIS — J189 Pneumonia, unspecified organism: Secondary | ICD-10-CM

## 2022-12-31 DIAGNOSIS — R5381 Other malaise: Secondary | ICD-10-CM | POA: Diagnosis not present

## 2022-12-31 DIAGNOSIS — R5383 Other fatigue: Secondary | ICD-10-CM

## 2022-12-31 DIAGNOSIS — Z8546 Personal history of malignant neoplasm of prostate: Secondary | ICD-10-CM | POA: Insufficient documentation

## 2022-12-31 DIAGNOSIS — R059 Cough, unspecified: Secondary | ICD-10-CM | POA: Diagnosis not present

## 2022-12-31 DIAGNOSIS — Z7901 Long term (current) use of anticoagulants: Secondary | ICD-10-CM | POA: Diagnosis not present

## 2022-12-31 DIAGNOSIS — R051 Acute cough: Secondary | ICD-10-CM

## 2022-12-31 DIAGNOSIS — Z20822 Contact with and (suspected) exposure to covid-19: Secondary | ICD-10-CM | POA: Insufficient documentation

## 2022-12-31 LAB — COMPREHENSIVE METABOLIC PANEL
ALT: 57 U/L — ABNORMAL HIGH (ref 0–44)
AST: 42 U/L — ABNORMAL HIGH (ref 15–41)
Albumin: 3 g/dL — ABNORMAL LOW (ref 3.5–5.0)
Alkaline Phosphatase: 99 U/L (ref 38–126)
Anion gap: 10 (ref 5–15)
BUN: 14 mg/dL (ref 8–23)
CO2: 24 mmol/L (ref 22–32)
Calcium: 9 mg/dL (ref 8.9–10.3)
Chloride: 100 mmol/L (ref 98–111)
Creatinine, Ser: 1.13 mg/dL (ref 0.61–1.24)
GFR, Estimated: >60 mL/min (ref >60)
Glucose, Bld: 163 mg/dL — ABNORMAL HIGH (ref 70–99)
Potassium: 3.7 mmol/L (ref 3.5–5.1)
Sodium: 134 mmol/L — ABNORMAL LOW (ref 135–145)
Total Bilirubin: 0.8 mg/dL (ref 0.3–1.2)
Total Protein: 6.8 g/dL (ref 6.5–8.1)

## 2022-12-31 LAB — CBC WITH DIFFERENTIAL/PLATELET
Abs Immature Granulocytes: 0.04 10*3/uL (ref 0.00–0.07)
Basophils Absolute: 0 10*3/uL (ref 0.0–0.1)
Basophils Relative: 0 %
Eosinophils Absolute: 0.4 10*3/uL (ref 0.0–0.5)
Eosinophils Relative: 5 %
HCT: 40.7 % (ref 39.0–52.0)
Hemoglobin: 13.9 g/dL (ref 13.0–17.0)
Immature Granulocytes: 1 %
Lymphocytes Relative: 20 %
Lymphs Abs: 1.6 10*3/uL (ref 0.7–4.0)
MCH: 28.1 pg (ref 26.0–34.0)
MCHC: 34.2 g/dL (ref 30.0–36.0)
MCV: 82.2 fL (ref 80.0–100.0)
Monocytes Absolute: 0.5 10*3/uL (ref 0.1–1.0)
Monocytes Relative: 6 %
Neutro Abs: 5.6 10*3/uL (ref 1.7–7.7)
Neutrophils Relative %: 68 %
Platelets: 408 10*3/uL — ABNORMAL HIGH (ref 150–400)
RBC: 4.95 MIL/uL (ref 4.22–5.81)
RDW: 13.9 % (ref 11.5–15.5)
WBC: 8.2 10*3/uL (ref 4.0–10.5)
nRBC: 0 % (ref 0.0–0.2)

## 2022-12-31 LAB — SARS CORONAVIRUS 2 BY RT PCR: SARS Coronavirus 2 by RT PCR: NEGATIVE

## 2022-12-31 LAB — PROTIME-INR
INR: 3.1 — ABNORMAL HIGH (ref 0.8–1.2)
Prothrombin Time: 32.5 seconds — ABNORMAL HIGH (ref 11.4–15.2)

## 2022-12-31 MED ORDER — AZITHROMYCIN 250 MG PO TABS: 250.0000 mg | ORAL_TABLET | Freq: Every day | ORAL | 0 refills | Status: DC

## 2022-12-31 MED ORDER — AMOXICILLIN-POT CLAVULANATE 875-125 MG PO TABS: 1.0000 | ORAL_TABLET | Freq: Two times a day (BID) | ORAL | 0 refills | Status: DC

## 2022-12-31 NOTE — Progress Notes (Signed)
Nicholas Owen is a 77 y.o. male here for a new problem.  History of Present Illness:   Chief Complaint  Patient presents with   Cough    Pt c/o cough and expectorating yellow sputum x 2 weeks, very fatigue/weak, loss of appetite, fever past 2 evenings 101, 100.   Patient is here with wife.  Fatigue, Malaise Has had cough with little yellow sputum, shortness of breath, weakness, lightheadedness, fatigue, chills, fever as high as 101 F since 12/17/22. Lightheadedness is worse with increased activity. Has reduced appetite. He has lost 13 pounds since 11/08/21. Denies hemoptysis, night sweats, body pain, nausea/vomiting. No recent travel. Had a similar cough in 08/2020. Compliant with Coumadin. Blood pressures measured at home are as follows: 108/69. This is low for him. Blood pressure in-clinic today is 108/70. History of prostate cancer.  Denies urinary symptoms. Taking Delsym.  Denies pain in abdomen, heartburn, diarrhea. Reports at-home Covid-19 test was negative two days ago. History of acute saddle pulmonary embolism 2022.  Past Medical History:  Diagnosis Date   Allergy    Asbestos exposure    Basal cell carcinoma    skin of left ear   BPH (benign prostatic hyperplasia)    Cancer (HCC)    basal cell CA (skin)   Colitis 01/2011   Colon polyp    Deviated nasal septum    hx. of   Diverticulosis    DVT (deep venous thrombosis) (HCC)    GERD (gastroesophageal reflux disease)    Hyperlipidemia    Kidney stones    Lipoma 12-24-11   multiple(present- lipoma 2-lt./4- rt arms/ back/ chest   Liver cyst 10/11   4 simple cysts in liver   Prostate cancer (HCC) 10/06/11   Gleason 3+4=7, vol 39 cc, PSA 5.15   Prostate cancer (HCC) 12-24-11   dx. Prostate cancer after bx.   Ulcerative colitis (HCC)      Social History   Tobacco Use   Smoking status: Never   Smokeless tobacco: Never   Tobacco comments:    never used tobacco  Vaping Use   Vaping status: Never Used   Substance Use Topics   Alcohol use: Yes    Comment: occasional    Drug use: No    Past Surgical History:  Procedure Laterality Date   COLONOSCOPY     CYSTOSCOPY  02/16/2012   Procedure: CYSTOSCOPY FLEXIBLE;  Surgeon: Valetta Fuller, MD;  Location: [facility];  Service: Urology;  Laterality: N/A;  FLEX CYSTO WITH REMOVAL OF STAPLES    INGUINAL HERNIA REPAIR  1990's   double   KNEE SURGERY  2007   right knee scope   LAPAROSCOPIC APPENDECTOMY N/A 09/15/2019   Procedure: APPENDECTOMY LAPAROSCOPIC;  Surgeon: Gaynelle Adu, MD;  Location: [facility];  Service: General;  Laterality: N/A;   lipoma removals  1990's   multiple and multiple remains now   LYMPH NODE DISSECTION  12/31/2011   Procedure: LYMPH NODE DISSECTION;  Surgeon: Valetta Fuller, MD;  Location: [facility];  Service: Urology;  Laterality: Bilateral;  Bilateral   POLYPECTOMY     ROBOT ASSISTED LAPAROSCOPIC RADICAL PROSTATECTOMY  12/31/2011   Procedure: ROBOTIC ASSISTED LAPAROSCOPIC RADICAL PROSTATECTOMY;  Surgeon: Valetta Fuller, MD;  Location: [facility];  Service: Urology;  Laterality: N/A;       TONSILLECTOMY      Family History  Problem Relation Age of Onset   Colon cancer Mother 49   Colon cancer Father 15   Prostate  cancer Paternal Grandfather    Esophageal cancer Paternal Grandfather    Stomach cancer Neg Hx    Rectal cancer Neg Hx     No Known Allergies  Current Medications:   Current Outpatient Medications:    cholecalciferol (VITAMIN D3) 25 MCG (1000 UNIT) tablet, Take 2,000 Units by mouth daily., Disp: , Rfl:    co-enzyme Q-10 50 MG capsule, Take 200 mg by mouth daily., Disp: , Rfl:    Multiple Vitamins-Minerals (MULTIVITAMIN WITH MINERALS) tablet, Take 1 tablet by mouth daily., Disp: , Rfl:    Multiple Vitamins-Minerals (ZINC PO), Take 1 tablet by mouth 1 day or 1 dose., Disp: , Rfl:    omeprazole (PRILOSEC) 20 MG capsule, TAKE 1 CAPSULE EVERY DAY, Disp: 90 capsule, Rfl: 3   rosuvastatin (CRESTOR)  10 MG tablet, TAKE 1 TABLET EVERY DAY, Disp: 90 tablet, Rfl: 3   warfarin (COUMADIN) 5 MG tablet, TAKE 1 TABLET DAILY OR AS DIRECTED BY ANTICOAGULATION CLINIC, Disp: 90 tablet, Rfl: 3   Review of Systems:   Review of Systems  Constitutional:  Positive for chills and malaise/fatigue. Negative for fever.  HENT:  Negative for congestion.   Eyes:  Negative for blurred vision.  Respiratory:  Positive for cough and sputum production (Yellow). Negative for hemoptysis and shortness of breath.   Cardiovascular:  Negative for chest pain, palpitations and leg swelling.  Gastrointestinal:  Negative for abdominal pain, diarrhea, heartburn, nausea and vomiting.  Genitourinary:  Negative for dysuria, flank pain, frequency, hematuria and urgency.  Musculoskeletal:  Negative for back pain.  Skin:  Negative for rash.  Neurological:  Positive for weakness. Negative for loss of consciousness and headaches.       (+) Lightheadedness    Vitals:   Vitals:   12/31/22 1327  BP: 108/70  Pulse: 81  Temp: (!) 97.5 F (36.4 C)  TempSrc: Temporal  SpO2: 95%  Weight: 244 lb 4 oz (110.8 kg)  Height: 6' (1.829 m)     Body mass index is 33.13 kg/m.  Physical Exam:   Physical Exam Vitals and nursing note reviewed.  Constitutional:      General: He is not in acute distress.    Appearance: He is well-developed. He is not ill-appearing or toxic-appearing.  Cardiovascular:     Rate and Rhythm: Normal rate and regular rhythm.     Pulses: Normal pulses.     Heart sounds: Normal heart sounds, S1 normal and S2 normal.  Pulmonary:     Effort: Pulmonary effort is normal.     Breath sounds: Normal breath sounds.  Skin:    General: Skin is warm and dry.  Neurological:     Mental Status: He is alert.     GCS: GCS eye subscore is 4. GCS verbal subscore is 5. GCS motor subscore is 6.  Psychiatric:        Speech: Speech normal.        Behavior: Behavior normal. Behavior is cooperative.     Assessment and  Plan:   Weakness; Malaise and fatigue; Acute cough Due to multitude of symptom(s), comorbidities and concerns for symptomatic dehydration -- will refer to Emergency Department -- patient and wife are in agreement and are going to [facility] at this time  I,Alexander Ruley,acting as a scribe for [employer], PA.,have documented all relevant documentation on the behalf of Jarold Motto, PA,as directed by  Jarold Motto, PA while in the presence of Jarold Motto, [place].  I, Jarold Motto, [place], have reviewed all  documentation for this visit. The documentation on 12/31/22 for the exam, diagnosis, procedures, and orders are all accurate and complete.  Jarold Motto, PA-C

## 2022-12-31 NOTE — ED Provider Notes (Signed)
[place] [facility] DEPARTMENT AT [facility] Provider Note  CSN: [id number] Arrival date & time: 12/31/22 1447  Chief Complaint(s) Weakness  HPI Nicholas Owen is a 77 y.o. 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  Patient reports weakness for around 2 weeks.  Last couple days weakness was worse and he had fevers and chills as well as cough productive of some yellow phlegm.  Denies any shortness of breath or chest pain.  No abdominal pain.  Does not feel at all like his prior pulmonary embolisms and he has been compliant with his warfarin.  No abdominal pain, leg swelling, recent travel or surgery.  He also reports some weight loss over the past couple weeks but has not been eating or drinking much due to his fatigue and general weakness.   Past Medical History Past Medical History:  Diagnosis Date   Allergy    Asbestos exposure    Basal cell carcinoma    skin of left ear   BPH (benign prostatic hyperplasia)    Cancer (HCC)    basal cell CA (skin)   Colitis 01/2011   Colon polyp    Deviated nasal septum    hx. of   Diverticulosis    DVT (deep venous thrombosis) (HCC)    GERD (gastroesophageal reflux disease)    Hyperlipidemia    Kidney stones    Lipoma 12-24-11   multiple(present- lipoma 2-lt./4- rt arms/ back/ chest   Liver cyst 10/11   4 simple cysts in liver   Prostate cancer (HCC) 10/06/11   Gleason 3+4=7, vol 39 cc, PSA 5.15   Prostate cancer (HCC) 12-24-11   dx. Prostate cancer after bx.   Ulcerative colitis [facility])    Patient Active Problem List   Diagnosis Date Noted   Long term (current) use of anticoagulants 10/31/2020   Primary hypercoagulable state (HCC) 10/31/2020   Pain in joint of left knee 06/15/2020   Diverticulitis 05/19/2018   Lupus anticoagulant disorder (HCC) 04/30/2015   Urinary incontinence 09/20/2014   Diverticulosis  03/22/2014   Erectile dysfunction 03/22/2014   History of DVT of lower extremity 08/31/2012   Personal history of colonic polyps 08/31/2012   Hyperglycemia 05/12/2012   Chronic pulmonary embolism (HCC) 04/13/2012   Basal cell carcinoma    Obesity 10/08/2011   History of prostate cancer 05/14/2011   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 Authorizing Provider  amoxicillin-clavulanate (AUGMENTIN) 875-125 MG tablet Take 1 tablet by mouth every 12 (twelve) hours. 12/31/22  Yes Lonell Grandchild, MD  azithromycin (ZITHROMAX) 250 MG tablet Take 1 tablet (250 mg total) by mouth daily. Take first 2 tablets together, then 1 every day until finished. 12/31/22  Yes Lonell Grandchild, MD  cholecalciferol (VITAMIN D3) 25 MCG (1000 UNIT) tablet Take 2,000 Units by mouth daily.    [provider]  co-enzyme Q-10 50 MG capsule Take 200 mg by mouth daily.    [provider]  Multiple Vitamins-Minerals (MULTIVITAMIN WITH MINERALS) tablet Take 1 tablet by mouth daily.    [provider]  Multiple Vitamins-Minerals (ZINC PO) Take 1 tablet by mouth 1 day or 1 dose.    [provider]  omeprazole (PRILOSEC) 20 MG capsule TAKE 1  CAPSULE EVERY DAY 09/03/22   Shelva Majestic, MD  rosuvastatin (CRESTOR) 10 MG tablet TAKE 1 TABLET EVERY DAY 08/21/22   Shelva Majestic, MD  warfarin (COUMADIN) 5 MG tablet TAKE 1 TABLET DAILY OR AS DIRECTED BY ANTICOAGULATION CLINIC 09/03/22   Shelva Majestic, MD                                                                                                                                    Past Surgical History Past Surgical History:  Procedure Laterality Date   COLONOSCOPY     CYSTOSCOPY  02/16/2012   Procedure: CYSTOSCOPY FLEXIBLE;   Surgeon: Valetta Fuller, MD;  Location: [facility];  Service: Urology;  Laterality: N/A;  FLEX CYSTO WITH REMOVAL OF STAPLES    INGUINAL HERNIA REPAIR  1990's   double   KNEE SURGERY  2007   right knee scope   LAPAROSCOPIC APPENDECTOMY N/A 09/15/2019   Procedure: APPENDECTOMY LAPAROSCOPIC;  Surgeon: Gaynelle Adu, MD;  Location: [facility];  Service: General;  Laterality: N/A;   lipoma removals  1990's   multiple and multiple remains now   LYMPH NODE DISSECTION  12/31/2011   Procedure: LYMPH NODE DISSECTION;  Surgeon: Valetta Fuller, MD;  Location: [facility];  Service: Urology;  Laterality: Bilateral;  Bilateral   POLYPECTOMY     ROBOT ASSISTED LAPAROSCOPIC RADICAL PROSTATECTOMY  12/31/2011   Procedure: ROBOTIC ASSISTED LAPAROSCOPIC RADICAL PROSTATECTOMY;  Surgeon: Valetta Fuller, MD;  Location: [facility];  Service: Urology;  Laterality: N/A;       TONSILLECTOMY     Family History Family History  Problem Relation Age of Onset   Colon cancer Mother 62   Colon cancer Father 59   Prostate cancer Paternal Grandfather    Esophageal cancer Paternal Grandfather    Stomach cancer Neg Hx    Rectal cancer Neg Hx     Social History Social History   Tobacco Use   Smoking status: Never   Smokeless tobacco: Never   Tobacco comments:    never used tobacco  Vaping Use   Vaping status: Never Used  Substance Use Topics   Alcohol use: Yes    Comment: occasional    Drug use: No   Allergies Patient has no known allergies.  Review of Systems Review of Systems  All other systems reviewed and are negative.   Physical Exam Vital Signs  I have reviewed the triage vital signs BP 125/83 (BP Location: Right Arm)   Pulse 83   Temp 97.7 F (36.5 C)   Resp 20   Ht 6' (1.829 m)   Wt 109.8 kg   SpO2 94%   BMI 32.82 kg/m  Physical Exam Vitals and nursing note reviewed.  Constitutional:      General: He is not in acute distress.    Appearance: Normal appearance.  HENT:      Mouth/Throat:  Mouth: Mucous membranes are moist.  Eyes:     Conjunctiva/sclera: Conjunctivae normal.  Cardiovascular:     Rate and Rhythm: Normal rate and regular rhythm.  Pulmonary:     Effort: Pulmonary effort is normal. No respiratory distress.     Breath sounds: Rales (bibasilar) present.  Abdominal:     General: Abdomen is flat.     Palpations: Abdomen is soft.     Tenderness: There is no abdominal tenderness.  Musculoskeletal:     Right lower leg: No edema.     Left lower leg: No edema.  Skin:    General: Skin is warm and dry.     Capillary Refill: Capillary refill takes less than 2 seconds.  Neurological:     General: No focal deficit present.     Mental Status: He is alert and oriented to person, place, and time. Mental status is at baseline.  Psychiatric:        Mood and Affect: Mood normal.        Behavior: Behavior normal.     ED Results and Treatments Labs (all labs ordered are listed, but only abnormal results are displayed) Labs Reviewed  CBC WITH DIFFERENTIAL/PLATELET - Abnormal; Notable for the following components:      Result Value   Platelets 408 (*)    All other components within normal limits  COMPREHENSIVE METABOLIC PANEL - Abnormal; Notable for the following components:   Sodium 134 (*)    Glucose, Bld 163 (*)    Albumin 3.0 (*)    AST 42 (*)    ALT 57 (*)    All other components within normal limits  PROTIME-INR - Abnormal; Notable for the following components:   Prothrombin Time 32.5 (*)    INR 3.1 (*)    All other components within normal limits  SARS CORONAVIRUS 2 BY RT PCR  URINALYSIS, ROUTINE W REFLEX MICROSCOPIC                                                                                                                          Radiology DG Chest 2 View  Result Date: 12/31/2022 CLINICAL DATA:  Cough EXAM: CHEST - 2 VIEW COMPARISON:  01/14/2021 and older x-ray FINDINGS: Hiatal hernia. Normal cardiopericardial silhouette. No  pneumothorax or effusion. There are some patchy parenchymal opacities identified right mid to lower lung and left lung base. Subtle infiltrates possible. Mild degenerative changes of the spine. IMPRESSION: Mild bilateral patchy lung opacities identified. Recommend follow-up. Small hiatal hernia.  Air-fluid level. Electronically Signed   By: Karen Kays M.D.   On: 12/31/2022 15:39    Pertinent labs & imaging results that were available during my care of the patient were reviewed by me and considered in my medical decision making (see MDM for details).  Medications Ordered in ED Medications - No data to display  Procedures Procedures  (including critical care time)  Medical Decision Making / ED Course   MDM:  77 year old male presenting to the emergency department with cough, fevers and weakness.  Patient is well-appearing, physical exam with bibasilar crackles, no leg swelling or elevated JVD to suggest volume overload.  Chest x-ray shows pulmonary infiltrates.  Given constellation of symptoms suspect community-acquired pneumonia.  Given the length of symptoms patient may have had a viral infection initially which worsened to a pneumonia in the past few days since he is only been having fevers recently.  Laboratory testing reassuring, no leukocytosis, INR is therapeutic.  He does have mild borderline LFT abnormalities, discussed with the patient need for recheck with primary physician.  Very low concern for alternative process such as intracranial process, he has no focal weakness or headaches, electrolytes are reassuring with no significant abnormalities such as hyponatremia to explain his weakness, vital signs are reassuring.  Doubt recurrent pulmonary embolism given he feels much different than previous episodes, no sign of DVT or PE, no tachycardia, and his INR is  therapeutic.  Discharged on Augmentin and azithromycin.  Discussed with pharmacist who reported that this should be okay with warfarin.  Curb 65 score is only 1 due to age so I think patient is safe for outpatient treatment. Will discharge patient to home. All questions answered. Patient comfortable with plan of discharge. Return precautions discussed with patient and specified on the after visit summary.       Additional history obtained: -Additional history obtained from spouse -External records from outside source obtained and reviewed including: Chart review including previous notes, labs, imaging, consultation notes including prior anticoagulation notes   Lab Tests: -I ordered, reviewed, and interpreted labs.   The pertinent results include:   Labs Reviewed  CBC WITH DIFFERENTIAL/PLATELET - Abnormal; Notable for the following components:      Result Value   Platelets 408 (*)    All other components within normal limits  COMPREHENSIVE METABOLIC PANEL - Abnormal; Notable for the following components:   Sodium 134 (*)    Glucose, Bld 163 (*)    Albumin 3.0 (*)    AST 42 (*)    ALT 57 (*)    All other components within normal limits  PROTIME-INR - Abnormal; Notable for the following components:   Prothrombin Time 32.5 (*)    INR 3.1 (*)    All other components within normal limits  SARS CORONAVIRUS 2 BY RT PCR  URINALYSIS, ROUTINE W REFLEX MICROSCOPIC    Notable for mild thrombocytosis, mild LFT abnormalities, no leukocytosis, mild AST/ALT elevation discussed with patient  EKG   EKG Interpretation Date/Time:  Wednesday December 31 2022 14:56:56 EDT Ventricular Rate:  85 PR Interval:  183 QRS Duration:  101 QT Interval:  371 QTC Calculation: 442 R Axis:   34  Text Interpretation: Sinus rhythm Abnormal R-wave progression, early transition Confirmed by Alvino Blood ([id number]) on 12/31/2022 5:13:05 PM         Imaging Studies ordered: I ordered imaging studies  including CXR On my interpretation imaging demonstrates pneumonia I independently visualized and interpreted imaging. I agree with the radiologist interpretation   Medicines ordered and prescription drug management: Meds ordered this encounter  Medications   amoxicillin-clavulanate (AUGMENTIN) 875-125 MG tablet    Sig: Take 1 tablet by mouth every 12 (twelve) hours.    Dispense:  14 tablet    Refill:  0   azithromycin (ZITHROMAX) 250 MG tablet    Sig: Take  1 tablet (250 mg total) by mouth daily. Take first 2 tablets together, then 1 every day until finished.    Dispense:  6 tablet    Refill:  0    -I have reviewed the patients home medicines and have made adjustments as needed   Cardiac Monitoring: The patient was maintained on a cardiac monitor.  I personally viewed and interpreted the cardiac monitored which showed an underlying rhythm of: NSR  Social Determinants of Health:  Diagnosis or treatment significantly limited by social determinants of health: obesity   Reevaluation: After the interventions noted above, I reevaluated the patient and found that their symptoms have improved  Co morbidities that complicate the patient evaluation  Past Medical History:  Diagnosis Date   Allergy    Asbestos exposure    Basal cell carcinoma    skin of left ear   BPH (benign prostatic hyperplasia)    Cancer (HCC)    basal cell CA (skin)   Colitis 01/2011   Colon polyp    Deviated nasal septum    hx. of   Diverticulosis    DVT (deep venous thrombosis) (HCC)    GERD (gastroesophageal reflux disease)    Hyperlipidemia    Kidney stones    Lipoma 12-24-11   multiple(present- lipoma 2-lt./4- rt arms/ back/ chest   Liver cyst 10/11   4 simple cysts in liver   Prostate cancer (HCC) 10/06/11   Gleason 3+4=7, vol 39 cc, PSA 5.15   Prostate cancer (HCC) 12-24-11   dx. Prostate cancer after bx.   Ulcerative colitis (HCC)       Dispostion: Disposition decision including need for  hospitalization was considered, and patient discharged from emergency department.    Final Clinical Impression(s) / ED Diagnoses Final diagnoses:  Pneumonia of both lower lobes due to infectious organism     This chart was dictated using voice recognition software.  Despite best efforts to proofread,  errors can occur which can change the documentation meaning.    Lonell Grandchild, MD 12/31/22 1745

## 2022-12-31 NOTE — ED Triage Notes (Addendum)
Pt c/o general weakness x 2 wks; decreased appetite; 12-14 lb weight loss; cough; fever and chills

## 2022-12-31 NOTE — ED Notes (Signed)
Discharge paperwork reviewed entirely with patient, including follow up care. Pain was under control. No prescriptions were called in, but all questions were addressed.  Pt verbalized understanding as well as all parties involved. No questions or concerns voiced at the time of discharge. No acute distress noted.   Pt ambulated out to PVA without incident or assistance.  

## 2022-12-31 NOTE — ED Notes (Signed)
Urine sample quantity was not sufficient per lab; need to recollect

## 2022-12-31 NOTE — ED Notes (Signed)
Cannot discharge, registration in chart.   

## 2022-12-31 NOTE — Discharge Instructions (Addendum)
We evaluated you for your weakness and cough.  Your chest x-ray shows findings of pneumonia and your physical exam also goes along with this.  We have treated you with 2 different antibiotics.  Please take these as prescribed.  We discussed with our pharmacist who believes that this should be safe to take with your warfarin, but please also touch base with your warfarin clinic as he may need a sooner checkup.  Please return to the emergency department if you have any new or worsening symptoms, high fevers, difficulty breathing, chest pain, increasing weakness or fatigue, or any other concerning symptoms.

## 2023-01-01 ENCOUNTER — Telehealth: Payer: Self-pay | Admitting: Family Medicine

## 2023-01-01 NOTE — Telephone Encounter (Signed)
Patient's spouse called stating ED diagnosed with pneumonia and was informed to f/u with PCP in 2 days. Caller states patient was given 14 days worth of antibiotics and isn't sure if the appointment tomorrow is needed since just seen and on medication. I scheduled patient for 8/7 with PCP just in case but is an OV needed in 2 days or can it wait until 8/7?

## 2023-01-01 NOTE — Telephone Encounter (Signed)
As long as he is feeling better August 7 is okay

## 2023-01-01 NOTE — Telephone Encounter (Signed)
See below

## 2023-01-02 NOTE — Telephone Encounter (Signed)
See below

## 2023-01-02 NOTE — Telephone Encounter (Signed)
Patient's wife has been notified of PCP message and verbalized understanding. Stated will be keeping 8/7 OV.

## 2023-01-07 ENCOUNTER — Encounter: Payer: Self-pay | Admitting: Family Medicine

## 2023-01-07 ENCOUNTER — Ambulatory Visit (INDEPENDENT_AMBULATORY_CARE_PROVIDER_SITE_OTHER): Payer: Medicare HMO | Admitting: Family Medicine

## 2023-01-07 VITALS — BP 100/72 | HR 80 | Temp 98.0°F | Ht 72.0 in | Wt 243.0 lb

## 2023-01-07 DIAGNOSIS — I2692 Saddle embolus of pulmonary artery without acute cor pulmonale: Secondary | ICD-10-CM | POA: Diagnosis not present

## 2023-01-07 DIAGNOSIS — I2782 Chronic pulmonary embolism: Secondary | ICD-10-CM

## 2023-01-07 DIAGNOSIS — D6862 Lupus anticoagulant syndrome: Secondary | ICD-10-CM

## 2023-01-07 DIAGNOSIS — J189 Pneumonia, unspecified organism: Secondary | ICD-10-CM

## 2023-01-07 DIAGNOSIS — R0602 Shortness of breath: Secondary | ICD-10-CM | POA: Diagnosis not present

## 2023-01-07 DIAGNOSIS — E785 Hyperlipidemia, unspecified: Secondary | ICD-10-CM | POA: Diagnosis not present

## 2023-01-07 DIAGNOSIS — R0989 Other specified symptoms and signs involving the circulatory and respiratory systems: Secondary | ICD-10-CM | POA: Diagnosis not present

## 2023-01-07 LAB — COMPREHENSIVE METABOLIC PANEL
ALT: 48 U/L (ref 0–53)
AST: 26 U/L (ref 0–37)
Albumin: 4 g/dL (ref 3.5–5.2)
Alkaline Phosphatase: 77 U/L (ref 39–117)
BUN: 15 mg/dL (ref 6–23)
CO2: 26 mEq/L (ref 19–32)
Calcium: 9.9 mg/dL (ref 8.4–10.5)
Chloride: 100 mEq/L (ref 96–112)
Creatinine, Ser: 0.99 mg/dL (ref 0.40–1.50)
GFR: 74.51 mL/min (ref >60.00)
Glucose, Bld: 109 mg/dL — ABNORMAL HIGH (ref 70–99)
Potassium: 4.3 mEq/L (ref 3.5–5.1)
Sodium: 136 mEq/L (ref 135–145)
Total Bilirubin: 0.8 mg/dL (ref 0.2–1.2)
Total Protein: 7 g/dL (ref 6.0–8.3)

## 2023-01-07 LAB — CBC WITH DIFFERENTIAL/PLATELET
Basophils Absolute: 0.1 10*3/uL (ref 0.0–0.1)
Basophils Relative: 0.6 % (ref 0.0–3.0)
Eosinophils Absolute: 0.4 10*3/uL (ref 0.0–0.7)
Eosinophils Relative: 4.7 % (ref 0.0–5.0)
HCT: 44.5 % (ref 39.0–52.0)
Hemoglobin: 14.5 g/dL (ref 13.0–17.0)
Lymphocytes Relative: 19.1 % (ref 12.0–46.0)
Lymphs Abs: 1.6 10*3/uL (ref 0.7–4.0)
MCHC: 32.6 g/dL (ref 30.0–36.0)
MCV: 84.2 fl (ref 78.0–100.0)
Monocytes Absolute: 0.6 10*3/uL (ref 0.1–1.0)
Monocytes Relative: 7.7 % (ref 3.0–12.0)
Neutro Abs: 5.5 10*3/uL (ref 1.4–7.7)
Neutrophils Relative %: 67.9 % (ref 43.0–77.0)
Platelets: 618 10*3/uL — ABNORMAL HIGH (ref 150.0–400.0)
RBC: 5.28 Mil/uL (ref 4.22–5.81)
RDW: 15.1 % (ref 11.5–15.5)
WBC: 8.1 10*3/uL (ref 4.0–10.5)

## 2023-01-07 LAB — BRAIN NATRIURETIC PEPTIDE: Pro B Natriuretic peptide (BNP): 20 pg/mL (ref 0.0–100.0)

## 2023-01-07 NOTE — Progress Notes (Signed)
Phone [phone] In person visit   Subjective:   Nicholas Owen is a 77 y.o. year old very pleasant male patient who presents for/with See problem oriented charting Chief Complaint  Patient presents with   ER F/U    Pt is here to f/u on pneumonia pt states he is no feeling so good    Past Medical History-  Patient Active Problem List   Diagnosis Date Noted   Lupus anticoagulant disorder (HCC) 04/30/2015    Priority: High   History of DVT of lower extremity 08/31/2012    Priority: High   Chronic pulmonary embolism (HCC) 04/13/2012    Priority: High   History of prostate cancer 05/14/2011    Priority: High   Diverticulitis 05/19/2018    Priority: Medium    Urinary incontinence 09/20/2014    Priority: Medium    Hyperglycemia 05/12/2012    Priority: Medium    GERD 10/04/2007    Priority: Medium    Hyperlipidemia 03/08/2007    Priority: Medium    Diverticulosis 03/22/2014    Priority: Low   Erectile dysfunction 03/22/2014    Priority: Low   Personal history of colonic polyps 08/31/2012    Priority: Low   Basal cell carcinoma     Priority: Low   Obesity 10/08/2011    Priority: Low   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   Primary hypercoagulable state (HCC) 10/31/2020   Pain in joint of left knee 06/15/2020    Medications- reviewed and updated Current Outpatient Medications  Medication Sig Dispense Refill   amoxicillin-clavulanate (AUGMENTIN) 875-125 MG tablet Take 1 tablet by mouth every 12 (twelve) hours. 14 tablet 0   azithromycin (ZITHROMAX) 250 MG tablet Take 1 tablet (250 mg total) by mouth daily. Take first 2 tablets together, then 1 every day until finished. 6 tablet 0   cholecalciferol (VITAMIN D3) 25 MCG (1000 UNIT) tablet Take 2,000 Units by mouth daily.      co-enzyme Q-10 50 MG capsule Take 200 mg by mouth daily.     Multiple Vitamins-Minerals (MULTIVITAMIN WITH MINERALS) tablet Take 1 tablet by mouth daily.     Multiple Vitamins-Minerals (ZINC PO) Take 1 tablet by mouth 1 day or 1 dose.     omeprazole (PRILOSEC) 20 MG capsule TAKE 1 CAPSULE EVERY DAY 90 capsule 3   rosuvastatin (CRESTOR) 10 MG tablet TAKE 1 TABLET EVERY DAY 90 tablet 3   warfarin (COUMADIN) 5 MG tablet TAKE 1 TABLET DAILY OR AS DIRECTED BY ANTICOAGULATION CLINIC 90 tablet 3   No current facility-administered medications for this visit.     Objective:  BP 100/72   Pulse 80   Temp 98 F (36.7 C)   Ht 6' (1.829 m)   Wt 243 lb (110.2 kg)   SpO2 95%   BMI 32.96 kg/m  Gen: NAD, resting comfortably Mild drainage in pharynx noted, nasal turbinates normal CV: RRR no murmurs rubs or gallops Lungs: blilateral lower lungs with crackles- but otherwise clear Abdomen: soft/nontender/nondistended/normal bowel sounds. No rebound or guarding.  Ext: no edema Skin: warm, dry     Assessment and Plan    # Emergency department follow-up for pneumonia S: Patient presented to the emergency department on 12/31/2022 with weakness for approximately 2 weeks with acceleration 2 days prior  to his visit.  At that time also started to have fever and chills as well as cough productive of yellow phlegm.  He denies shortness of breath or chest pain or abdominal pain.  He was concerned about pulmonary embolism but had been compliant with his warfarin given his history  He had bibasilar crackles on exam and chest x-ray showed pulmonary infiltrates concerning for community-acquired pneumonia-with the radiation they wondered if may have started as viral infection and had secondary bacterial pneumonia.  INR was therapeutic.  Borderline LFT elevations with AST at 42 and ALT at 57- had been normal month prior.  EKG largely reassuring. Also had low albumin- he states nutrition has been low with this and has  lost some weight- down 8 lbs in a month -He was discharged on Augmentin and azithromycin combination  At home reports has still felt somewhat weak and woozy. This morning got into a coughing spell this morning with shaving after some deep breaths- but in general has been better. Felt nauseous. Tonight will be last night of antibiotic. Still no shortness of breath. Deep breaths still cause cough. No fevers recently. Resting and taking naps.  A/P: CAP appears to be improving but slowly- we discussed this can be a journey and take a month or two- but should be gradually improving during this time so if worsens he should let us know as soon as possible  - I do want him to get another chest x-ray in 1 month to make sure it has cleared -I do want to update labs with prior mild abnormalities including CBC, CMP  - may want to start limiting naps to 2 hours a day -he reports no shortness of breath but wife feels like he apperas more winded with activity- we will check BNP as also has crackles- consider echocardiogram if BNP elevated   # Chronic pulmonary embolism/DVT in patient with lupus anticoagulant disorder # History of right heart strain due to submassive saddle pulmonary embolism previously-echocardiogram later showed resolution along with VQ scan S:Medication: Remains on long-term Coumadin monitored by [facility]. INR was 3.1 in hospital.  -per hematology "During his last visit, I have recommended transition to coumadin since DOAC's are inferior to coumadin in APLS He was able to successfully transition to Coumadin with his primary care physician" A/P: chronic PULMONARY EMBOLISM/DVT  (due to lupus anticoagulant disorder)appropriately treated- no shortness of breath or leg swelling- doubt deep vein thrombosis/pulmonary embolism  - continue long term coumadin  #hyperlipidemia S: Medication:Rosuvastatin 10 mg daily along with coenzyme q10 Lab Results  Component Value Date   CHOL 122  12/02/2022   HDL 36.20 (L) 12/02/2022   LDLCALC 61 12/02/2022   LDLDIRECT 78 02/29/2020   TRIG 122.0 12/02/2022   CHOLHDL 3 12/02/2022  A/P: at goal continue current medications with LDL under 70  Recommended follow up: Return for next already scheduled visit or sooner if needed. Future Appointments  Date Time Provider Department Center  01/13/2023 10:00 AM [facility] None  06/19/2023 10:00 AM Shelva Majestic, MD [facility]   Lab/Order associations:   ICD-10-CM   1. Community acquired pneumonia, unspecified laterality  J18.9 DG Chest 2 View    2. 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 Lupus anticoagulant disorder (HCC)  D68.62     4. Hyperlipidemia, unspecified hyperlipidemia type  E78.5 Comprehensive metabolic panel    CBC with Differential/Platelet  5. Lung crackles  R09.89 Brain natriuretic peptide    6. Shortness of breath  R06.02 DG Chest 2 View    Brain natriuretic peptide     No orders of the defined types were placed in this encounter.  Return precautions advised.  Tana Conch, MD

## 2023-01-07 NOTE — Patient Instructions (Addendum)
Let us know if you get a flu shot this fall oncology eyou feel recovered from this Also consider prevnar 20 in 2026 - 5 years out form last shot  Please stop by lab before you go If you have mychart- we will send your results within 3 business days of [place] receiving them.  If you do not have mychart- we will call you about results within 5 business days of [place] receiving them.  *please also note that you will see labs on mychart as soon as they post. I will later go in and write notes on them- will say "notes from Dr. Durene Cal"   In one month Please go to [facility] (updated 07/28/2019) - located [address] across the [address] from [place] - in the basement - Hours: 8:30-5:00 PM M-F (with lunch from 12:30- 1 PM). You do NOT need an appointment.    : CAP appears to be improving but slowly- we discussed this can be a journey and take a month or two- but should be gradually improving during this time so if worsens he should let us know as soon as possible  - I do want him to get another chest x-ray in 1 month to make sure it has cleared -I do want to update labs with prior mild abnormalities including CBC, CMP  - may want to start limiting naps to 2 hours a day  Recommended follow up: Return for next already scheduled visit or sooner if needed.

## 2023-01-13 ENCOUNTER — Ambulatory Visit (INDEPENDENT_AMBULATORY_CARE_PROVIDER_SITE_OTHER): Payer: Medicare HMO

## 2023-01-13 DIAGNOSIS — Z7901 Long term (current) use of anticoagulants: Secondary | ICD-10-CM

## 2023-01-13 LAB — POCT INR: INR: 3.4 — AB (ref 2.0–3.0)

## 2023-01-13 NOTE — Patient Instructions (Addendum)
Pre visit review using our clinic review tool, if applicable. No additional management support is needed unless otherwise documented below in the visit note.  Hold warfarin today and then change weekly dose to take 1 tablet daily except take 1/2 tablet on Monday, Wednesday and Friday. Recheck in 2 weeks.

## 2023-01-13 NOTE — Progress Notes (Signed)
Pt in ER on 7/31 for community acquired pneumonia and was d/c with Augmentin and Azithromycin. Pt has now finished both abx.  Pt reports he is also taking a cough syrup/sleep aid OTC also and thinks it is generic Nyquil. Pt is only taking 1/2 dose in the evening. Hold warfarin today and then change weekly dose to take 1 tablet daily except take 1/2 tablet on Monday, Wednesday and Friday. Recheck in 2 weeks.

## 2023-01-30 ENCOUNTER — Ambulatory Visit (INDEPENDENT_AMBULATORY_CARE_PROVIDER_SITE_OTHER): Payer: Medicare HMO

## 2023-01-30 DIAGNOSIS — Z7901 Long term (current) use of anticoagulants: Secondary | ICD-10-CM | POA: Diagnosis not present

## 2023-01-30 LAB — POCT INR: INR: 2.2 (ref 2.0–3.0)

## 2023-01-30 NOTE — Patient Instructions (Addendum)
Pre visit review using our clinic review tool, if applicable. No additional management support is needed unless otherwise documented below in the visit note.  Change weekly dosing to prior dosing. Take 1 tablet daily except take 1/2 tablet on Sundays and Wednesdays. Recheck in 2 weeks.

## 2023-01-30 NOTE — Progress Notes (Addendum)
Last 2 INRs were supratherapeutic due to two abx for pneumonia diagnosis. Weekly dosing was changed last INR check due to tow elevated INRs in a row. Now that pt is off of the abx and he has been stable on his prior dose for several months his weekly dose will be changed back to prior dosing even though he is in range today.  Change weekly dosing to prior dosing. Take 1 tablet daily except take 1/2 tablet on Sundays and Wednesdays. Recheck in 2 weeks.

## 2023-02-06 ENCOUNTER — Ambulatory Visit (INDEPENDENT_AMBULATORY_CARE_PROVIDER_SITE_OTHER)
Admission: RE | Admit: 2023-02-06 | Discharge: 2023-02-06 | Disposition: A | Discharge status: Home / Self Care | Payer: Medicare HMO | Source: Ambulatory Visit | Attending: Family Medicine | Admitting: Family Medicine

## 2023-02-06 DIAGNOSIS — K449 Diaphragmatic hernia without obstruction or gangrene: Secondary | ICD-10-CM | POA: Diagnosis not present

## 2023-02-06 DIAGNOSIS — R0602 Shortness of breath: Secondary | ICD-10-CM

## 2023-02-06 DIAGNOSIS — J189 Pneumonia, unspecified organism: Secondary | ICD-10-CM

## 2023-02-06 DIAGNOSIS — R918 Other nonspecific abnormal finding of lung field: Secondary | ICD-10-CM | POA: Diagnosis not present

## 2023-02-13 ENCOUNTER — Encounter: Payer: Self-pay | Admitting: Physician Assistant

## 2023-02-13 ENCOUNTER — Telehealth: Payer: Self-pay

## 2023-02-13 ENCOUNTER — Ambulatory Visit (INDEPENDENT_AMBULATORY_CARE_PROVIDER_SITE_OTHER): Payer: Medicare HMO

## 2023-02-13 ENCOUNTER — Telehealth (INDEPENDENT_AMBULATORY_CARE_PROVIDER_SITE_OTHER): Payer: Medicare HMO | Admitting: Physician Assistant

## 2023-02-13 VITALS — Temp 98.0°F | Ht 72.0 in | Wt 242.0 lb

## 2023-02-13 DIAGNOSIS — Z7901 Long term (current) use of anticoagulants: Secondary | ICD-10-CM

## 2023-02-13 DIAGNOSIS — R051 Acute cough: Secondary | ICD-10-CM | POA: Diagnosis not present

## 2023-02-13 LAB — POCT INR: INR: 2.8 (ref 2.0–3.0)

## 2023-02-13 MED ORDER — BENZONATATE 100 MG PO CAPS: 100.0000 mg | ORAL_CAPSULE | Freq: Two times a day (BID) | ORAL | 0 refills | Status: DC | PRN

## 2023-02-13 MED ORDER — AMOXICILLIN-POT CLAVULANATE 875-125 MG PO TABS: 1.0000 | ORAL_TABLET | Freq: Two times a day (BID) | ORAL | 0 refills | Status: DC

## 2023-02-13 MED ORDER — IPRATROPIUM BROMIDE 0.03 % NA SOLN: 2.0000 | Freq: Two times a day (BID) | NASAL | 12 refills | Status: DC

## 2023-02-13 MED ORDER — AZITHROMYCIN 250 MG PO TABS: ORAL_TABLET | ORAL | 0 refills | Status: AC

## 2023-02-13 NOTE — Patient Instructions (Addendum)
Pre visit review using our clinic review tool, if applicable. No additional management support is needed unless otherwise documented below in the visit note.  Continue 1 tablet daily except take 1/2 tablet on Sundays and Wednesdays. Recheck in 6 weeks.

## 2023-02-13 NOTE — Telephone Encounter (Signed)
Review of chart revealed pt placed on two abx today, Augmentin and azithromycin. No interaction with Augmentin but azithromycin will increase INR.   Contacted pt who reports he started the abxs today. Advised  he should have INR checked next week. Schedule pt for 9/17. Advised if any s/s of bleeding or abnormal bruising to go to the ER or call 911. Pt denies any s/s now. Pt verbalized understanding.

## 2023-02-13 NOTE — Progress Notes (Signed)
Pt c/o of consistent cough lingering after diagnosis and treatment for pneumonia. Pt has video visit with provider today.  Pt was advised if any abx prescribed today to contact coumadin clinic.  Continue 1 tablet daily except take 1/2 tablet on Sundays and Wednesdays. Recheck in 6 weeks.

## 2023-02-13 NOTE — Progress Notes (Signed)
I acted as a Neurosurgeon for [employer], PA-C Corky Mull, LPN  Virtual Visit via Video Note   I, Jarold Motto, connected with  Nicholas Owen  ([id number], 28-Jan-1946) on 02/13/23 at 10:40 AM EDT by a video-enabled telemedicine application and verified that I am speaking with the correct person using two identifiers.  Location: Patient: Home Provider: [facility] [place] [facility] office   I discussed the limitations of evaluation and management by telemedicine and the availability of in person appointments. The patient expressed understanding and agreed to proceed.    History of Present Illness: Nicholas Owen is a 77 y.o. who identifies as a male who was assigned male at birth, and is being seen today for cough. Pt c/o persistent cough, chest congestion with rattling sound when he breathes, cough worse at night having difficulty sleeping. Having a lot of nasal drainage white to yellow. Denies fever or chills. Has completed 2 rounds of antibiotics.  He was diagnosed with pneumonia at ER on 12/31/22 and given Augmentin and azithromycin Had follow-up with PCP on 01/07/23 and was evaluated, given reassurance on slow improvement Patient was recommended to have repeat CXR to follow-up on pneumonia ER  He is on schedule today for worsening return of symptom(s)  CXR reveals   FINDINGS: Improved aeration of the lungs with residual or recurrent bibasilar reticular opacities, possibly due to atypical/viral infection. Stable cardiac and mediastinal contours with moderate hiatal hernia. No pleural effusion or pneumothorax.   IMPRESSION: Improved aeration of the lungs with residual or recurrent bibasilar reticular opacities, possibly due to atypical/viral infection.  He has not done a recent COVID test.  Problems:  Patient Active Problem List   Diagnosis Date Noted   Long term (current) use of anticoagulants 10/31/2020   Primary hypercoagulable state (HCC) 10/31/2020   Pain in  joint of left knee 06/15/2020   Diverticulitis 05/19/2018   Lupus anticoagulant disorder (HCC) 04/30/2015   Urinary incontinence 09/20/2014   Diverticulosis 03/22/2014   Erectile dysfunction 03/22/2014   History of DVT of lower extremity 08/31/2012   Personal history of colonic polyps 08/31/2012   Hyperglycemia 05/12/2012   Chronic pulmonary embolism (HCC) 04/13/2012   Basal cell carcinoma    Obesity 10/08/2011   History of prostate cancer 05/14/2011   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   Kidney stone 06/02/2010   Allergic rhinitis 10/04/2007   GERD 10/04/2007   Hyperlipidemia 03/08/2007    Allergies: No Known Allergies Medications:  Current Outpatient Medications:    amoxicillin-clavulanate (AUGMENTIN) 875-125 MG tablet, Take 1 tablet by mouth 2 (two) times daily., Disp: 14 tablet, Rfl: 0   azithromycin (ZITHROMAX) 250 MG tablet, Take 2 tablets on day 1, then 1 tablet daily on days 2 through 5, Disp: 6 tablet, Rfl: 0   benzonatate (TESSALON) 100 MG capsule, Take 1 capsule (100 mg total) by mouth 2 (two) times daily as needed for cough., Disp: 30 capsule, Rfl: 0   cholecalciferol (VITAMIN D3) 25 MCG (1000 UNIT) tablet, Take 2,000 Units by mouth daily., Disp: , Rfl:    co-enzyme Q-10 50 MG capsule, Take 200 mg by mouth daily., Disp: , Rfl:    ipratropium (ATROVENT) 0.03 % nasal spray, Place 2 sprays into both nostrils every 12 (twelve) hours., Disp: 30 mL, Rfl: 12   Multiple Vitamins-Minerals (MULTIVITAMIN WITH MINERALS) tablet, Take 1 tablet by mouth daily., Disp: , Rfl:    omeprazole (PRILOSEC) 20 MG capsule, TAKE 1 CAPSULE EVERY DAY, Disp: 90 capsule, Rfl: 3  rosuvastatin (CRESTOR) 10 MG tablet, TAKE 1 TABLET EVERY DAY, Disp: 90 tablet, Rfl: 3   warfarin (COUMADIN) 5 MG tablet, TAKE 1 TABLET DAILY OR AS DIRECTED BY ANTICOAGULATION CLINIC, Disp: 90 tablet, Rfl: 3  Observations/Objective: Patient is well-developed, well-nourished in no acute distress.  Resting comfortably   at home.  Head is normocephalic, atraumatic.  No labored breathing.  Speech is clear and coherent with logical content.  Patient is alert and oriented at baseline.   Assessment and Plan: 1. Acute cough No red flags on exam.  Possible return of early Community Acquired Pneumonia (CAP)  -- difficult to fully assess due to virtual nature of visit.  Recommend Azithromycin, Augmentin, atrovent nasal spray and tessalon Perles for cough Discussed taking medications as prescribed. Reviewed return precautions including fever, SOB, worsening cough or other concerns. Push fluids and rest. I recommend that patient follow-up if symptoms worsen or persist despite treatment x 7-10 days, sooner if needed.   Follow Up Instructions: I discussed the assessment and treatment plan with the patient. The patient was provided an opportunity to ask questions and all were answered. The patient agreed with the plan and demonstrated an understanding of the instructions.  A copy of instructions were sent to the patient via MyChart unless otherwise noted below.   The patient was advised to call back or seek an in-person evaluation if the symptoms worsen or if the condition fails to improve as anticipated.  Jarold Motto, [place]

## 2023-02-13 NOTE — Patient Instructions (Signed)
Start Nasal saline spray (i.e., Simply Saline) or nasal saline lavage (i.e., NeilMed) is recommended as needed and prior to medicated nasal sprays. Continue Flonase 1 spray twice a day. Use tessalon perles as needed during the day for cough (keep away from children - can be toxic!). If tessalon perles ineffective, trial 12-hour delsym over the counter cough syrup (generic is fine!)

## 2023-02-17 ENCOUNTER — Ambulatory Visit (INDEPENDENT_AMBULATORY_CARE_PROVIDER_SITE_OTHER): Payer: Medicare HMO

## 2023-02-17 DIAGNOSIS — Z7901 Long term (current) use of anticoagulants: Secondary | ICD-10-CM | POA: Diagnosis not present

## 2023-02-17 LAB — POCT INR: INR: 2.6 (ref 2.0–3.0)

## 2023-02-17 NOTE — Progress Notes (Signed)
Pt was c/o of consistent cough lingering after diagnosis and treatment for pneumonia, at last coumadin clinic apt. Pt was prescribed azythromycin and Augmentin on 9/13  x 7 days. Continue 1 tablet daily except take 1/2 tablet on Sundays and Wednesdays. Recheck in 6 weeks.

## 2023-02-17 NOTE — Patient Instructions (Addendum)
Pre visit review using our clinic review tool, if applicable. No additional management support is needed unless otherwise documented below in the visit note.  Continue 1 tablet daily except take 1/2 tablet on Sundays and Wednesdays. Recheck in 6 weeks.

## 2023-02-25 ENCOUNTER — Telehealth: Payer: Self-pay | Admitting: *Deleted

## 2023-02-25 NOTE — Telephone Encounter (Signed)
Pt called back and spoke to [facility] and he told her the medication Atrovent was not needed. He did not pick it up.

## 2023-02-25 NOTE — Telephone Encounter (Signed)
Left message on voicemail calling to see if you got Atrovent nasal spray that [facility] ordered for you? Please call office.

## 2023-02-25 NOTE — Telephone Encounter (Signed)
Pt called and would like a call back.

## 2023-02-25 NOTE — Telephone Encounter (Signed)
Left message on voicemail to call office. Need to see if pt picked up Atrovent nasal spray.

## 2023-03-27 ENCOUNTER — Ambulatory Visit: Payer: Medicare HMO

## 2023-03-31 ENCOUNTER — Ambulatory Visit (INDEPENDENT_AMBULATORY_CARE_PROVIDER_SITE_OTHER): Payer: Medicare HMO

## 2023-03-31 DIAGNOSIS — Z7901 Long term (current) use of anticoagulants: Secondary | ICD-10-CM

## 2023-03-31 LAB — POCT INR: INR: 2.3 (ref 2.0–3.0)

## 2023-03-31 NOTE — Patient Instructions (Addendum)
Pre visit review using our clinic review tool, if applicable. No additional management support is needed unless otherwise documented below in the visit note.  Continue 1 tablet daily except take 1/2 tablet on Sundays and Wednesdays. Recheck in 6 weeks.

## 2023-03-31 NOTE — Progress Notes (Signed)
Continue 1 tablet daily except take 1/2 tablet on Sundays and Wednesdays. Recheck in 6 weeks.

## 2023-04-17 ENCOUNTER — Encounter: Payer: Self-pay | Admitting: Physician Assistant

## 2023-04-20 ENCOUNTER — Encounter: Payer: Self-pay | Admitting: Gastroenterology

## 2023-05-12 ENCOUNTER — Ambulatory Visit (INDEPENDENT_AMBULATORY_CARE_PROVIDER_SITE_OTHER): Payer: Medicare HMO

## 2023-05-12 DIAGNOSIS — Z7901 Long term (current) use of anticoagulants: Secondary | ICD-10-CM

## 2023-05-12 LAB — POCT INR: INR: 2.2 (ref 2.0–3.0)

## 2023-05-12 NOTE — Progress Notes (Addendum)
Continue 1 tablet daily except take 1/2 tablet on Sundays and Wednesdays. Recheck in 6 weeks.   Pt requested a nurse visit for flu vaccine this week. Offered to give pt flu vaccine but he said he did not have a short sleeve shirt on and would like to come back later this week. Made apt for 12/12 for flu vaccine. Pt appreciative.

## 2023-05-12 NOTE — Patient Instructions (Addendum)
Pre visit review using our clinic review tool, if applicable. No additional management support is needed unless otherwise documented below in the visit note.  Continue 1 tablet daily except take 1/2 tablet on Sundays and Wednesdays. Recheck in 6 weeks.

## 2023-05-14 ENCOUNTER — Ambulatory Visit: Payer: Medicare HMO

## 2023-05-14 DIAGNOSIS — Z23 Encounter for immunization: Secondary | ICD-10-CM | POA: Diagnosis not present

## 2023-05-14 NOTE — Progress Notes (Signed)
PT visits today for their high dose flu vaccination. PT informed of what they were receiving and tolerated injection well. PT advised to reach out to office if needed.

## 2023-06-16 ENCOUNTER — Ambulatory Visit (INDEPENDENT_AMBULATORY_CARE_PROVIDER_SITE_OTHER): Payer: Medicare Other

## 2023-06-16 DIAGNOSIS — N5231 Erectile dysfunction following radical prostatectomy: Secondary | ICD-10-CM | POA: Diagnosis not present

## 2023-06-16 DIAGNOSIS — Z7901 Long term (current) use of anticoagulants: Secondary | ICD-10-CM

## 2023-06-16 DIAGNOSIS — N393 Stress incontinence (female) (male): Secondary | ICD-10-CM | POA: Diagnosis not present

## 2023-06-16 DIAGNOSIS — C61 Malignant neoplasm of prostate: Secondary | ICD-10-CM | POA: Diagnosis not present

## 2023-06-16 LAB — POCT INR: INR: 2.4 (ref 2.0–3.0)

## 2023-06-16 NOTE — Progress Notes (Signed)
Continue 1 tablet daily except take 1/2 tablet on Sundays and Wednesdays. Recheck in 6 weeks.

## 2023-06-16 NOTE — Patient Instructions (Addendum)
Pre visit review using our clinic review tool, if applicable. No additional management support is needed unless otherwise documented below in the visit note.  Continue 1 tablet daily except take 1/2 tablet on Sundays and Wednesdays. Recheck in 6 weeks.

## 2023-06-19 ENCOUNTER — Encounter: Payer: Self-pay | Admitting: Family Medicine

## 2023-06-19 ENCOUNTER — Ambulatory Visit (INDEPENDENT_AMBULATORY_CARE_PROVIDER_SITE_OTHER): Payer: Medicare Other | Admitting: Family Medicine

## 2023-06-19 VITALS — BP 112/70 | HR 73 | Temp 97.7°F | Ht 72.0 in | Wt 256.0 lb

## 2023-06-19 DIAGNOSIS — D6859 Other primary thrombophilia: Secondary | ICD-10-CM

## 2023-06-19 DIAGNOSIS — E785 Hyperlipidemia, unspecified: Secondary | ICD-10-CM | POA: Diagnosis not present

## 2023-06-19 DIAGNOSIS — Z8546 Personal history of malignant neoplasm of prostate: Secondary | ICD-10-CM

## 2023-06-19 DIAGNOSIS — Z0000 Encounter for general adult medical examination without abnormal findings: Secondary | ICD-10-CM

## 2023-06-19 DIAGNOSIS — Z131 Encounter for screening for diabetes mellitus: Secondary | ICD-10-CM

## 2023-06-19 DIAGNOSIS — I2692 Saddle embolus of pulmonary artery without acute cor pulmonale: Secondary | ICD-10-CM

## 2023-06-19 DIAGNOSIS — I2782 Chronic pulmonary embolism: Secondary | ICD-10-CM

## 2023-06-19 DIAGNOSIS — R739 Hyperglycemia, unspecified: Secondary | ICD-10-CM | POA: Diagnosis not present

## 2023-06-19 DIAGNOSIS — D6862 Lupus anticoagulant syndrome: Secondary | ICD-10-CM | POA: Diagnosis not present

## 2023-06-19 LAB — COMPREHENSIVE METABOLIC PANEL
ALT: 22 U/L (ref 0–53)
AST: 21 U/L (ref 0–37)
Albumin: 4.3 g/dL (ref 3.5–5.2)
Alkaline Phosphatase: 60 U/L (ref 39–117)
BUN: 15 mg/dL (ref 6–23)
CO2: 25 meq/L (ref 19–32)
Calcium: 9.2 mg/dL (ref 8.4–10.5)
Chloride: 106 meq/L (ref 96–112)
Creatinine, Ser: 0.88 mg/dL (ref 0.40–1.50)
GFR: 83.84 mL/min (ref >60.00)
Glucose, Bld: 106 mg/dL — ABNORMAL HIGH (ref 70–99)
Potassium: 4.4 meq/L (ref 3.5–5.1)
Sodium: 138 meq/L (ref 135–145)
Total Bilirubin: 1.2 mg/dL (ref 0.2–1.2)
Total Protein: 6.7 g/dL (ref 6.0–8.3)

## 2023-06-19 LAB — CBC WITH DIFFERENTIAL/PLATELET
Basophils Absolute: 0.1 10*3/uL (ref 0.0–0.1)
Basophils Relative: 0.8 % (ref 0.0–3.0)
Eosinophils Absolute: 0.5 10*3/uL (ref 0.0–0.7)
Eosinophils Relative: 7.5 % — ABNORMAL HIGH (ref 0.0–5.0)
HCT: 45 % (ref 39.0–52.0)
Hemoglobin: 15.2 g/dL (ref 13.0–17.0)
Lymphocytes Relative: 26.2 % (ref 12.0–46.0)
Lymphs Abs: 1.6 10*3/uL (ref 0.7–4.0)
MCHC: 33.8 g/dL (ref 30.0–36.0)
MCV: 85.6 fL (ref 78.0–100.0)
Monocytes Absolute: 0.5 10*3/uL (ref 0.1–1.0)
Monocytes Relative: 8.6 % (ref 3.0–12.0)
Neutro Abs: 3.6 10*3/uL (ref 1.4–7.7)
Neutrophils Relative %: 56.9 % (ref 43.0–77.0)
Platelets: 291 10*3/uL (ref 150.0–400.0)
RBC: 5.26 Mil/uL (ref 4.22–5.81)
RDW: 15.2 % (ref 11.5–15.5)
WBC: 6.3 10*3/uL (ref 4.0–10.5)

## 2023-06-19 LAB — LIPID PANEL
Cholesterol: 137 mg/dL (ref 0–200)
HDL: 39.2 mg/dL (ref >39.00)
LDL Cholesterol: 74 mg/dL (ref 0–99)
NonHDL: 97.78
Total CHOL/HDL Ratio: 3
Triglycerides: 119 mg/dL (ref 0.0–149.0)
VLDL: 23.8 mg/dL (ref 0.0–40.0)

## 2023-06-19 LAB — HEMOGLOBIN A1C: Hgb A1c MFr Bld: 6.3 % (ref 4.6–6.5)

## 2023-06-19 NOTE — Patient Instructions (Addendum)
Keep follow up with gastroenterology   You are eligible to schedule your annual wellness visit with our nurse specialist Inetta Fermo.  Please consider scheduling this before you leave today  I think it would be reasonable to do the RSV shot now and in the spring due tetanus shot (you will be due in April).  -since you've had old shingles shot can hold off for now and prioritize above 2   Please stop by lab before you go If you have mychart- we will send your results within 3 business days of [place] receiving them.  If you do not have mychart- we will call you about results within 5 business days of [place] receiving them.  *please also note that you will see labs on mychart as soon as they post. I will later go in and write notes on them- will say "notes from Dr. Durene Cal"   Recommended follow up: Return in about 1 year (around 06/18/2024) for physical or sooner if needed.Schedule b4 you leave.

## 2023-06-19 NOTE — Progress Notes (Signed)
Phone: [phone]   Subjective:  Patient presents today for their annual physical. Chief complaint-noted.   See problem oriented charting- ROS- full  review of systems was completed and negative  Per full ROS sheet completed by patient- baseline urinary incontinence after prior surgery  The following were reviewed and entered/updated in epic: Past Medical History:  Diagnosis Date   Allergy    Asbestos exposure    Basal cell carcinoma    skin of left ear   BPH (benign prostatic hyperplasia)    Cancer (HCC)    basal cell CA (skin)   Colitis 01/2011   Colon polyp    Deviated nasal septum    hx. of   Diverticulosis    DVT (deep venous thrombosis) (HCC)    GERD (gastroesophageal reflux disease)    Hyperlipidemia    Kidney stones    Lipoma 12-24-11   multiple(present- lipoma 2-lt./4- rt arms/ back/ chest   Liver cyst 10/11   4 simple cysts in liver   Prostate cancer (HCC) 10/06/11   Gleason 3+4=7, vol 39 cc, PSA 5.15   Prostate cancer (HCC) 12-24-11   dx. Prostate cancer after bx.   Ulcerative colitis [facility])    Patient Active Problem List   Diagnosis Date Noted   Lupus anticoagulant disorder (HCC) 04/30/2015    Priority: High   History of DVT of lower extremity 08/31/2012    Priority: High   Chronic pulmonary embolism (HCC) 04/13/2012    Priority: High   History of prostate cancer 05/14/2011    Priority: High   Diverticulitis 05/19/2018    Priority: Medium    Urinary incontinence 09/20/2014    Priority: Medium    Hyperglycemia 05/12/2012    Priority: Medium    GERD 10/04/2007    Priority: Medium    Hyperlipidemia 03/08/2007    Priority: Medium    Diverticulosis 03/22/2014    Priority: Low   Erectile dysfunction 03/22/2014    Priority: Low   History of colonic polyps 08/31/2012    Priority: Low   Basal cell carcinoma     Priority: Low   Obesity 10/08/2011    Priority: Low   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   Primary hypercoagulable state (HCC) 10/31/2020   Pain in joint of left knee 06/15/2020   Past Surgical History:  Procedure Laterality Date   COLONOSCOPY     CYSTOSCOPY  02/16/2012   Procedure: CYSTOSCOPY FLEXIBLE;  Surgeon: Valetta Fuller, MD;  Location: [facility];  Service: Urology;  Laterality: N/A;  FLEX CYSTO WITH REMOVAL OF STAPLES    INGUINAL HERNIA REPAIR  1990's   double   KNEE SURGERY  2007   right knee scope   LAPAROSCOPIC APPENDECTOMY N/A 09/15/2019   Procedure: APPENDECTOMY LAPAROSCOPIC;  Surgeon: Gaynelle Adu, MD;  Location: [facility];  Service: General;  Laterality: N/A;   lipoma removals  1990's   multiple and multiple remains now   LYMPH NODE DISSECTION  12/31/2011   Procedure: LYMPH NODE DISSECTION;  Surgeon: Valetta Fuller, MD;  Location: [facility];  Service: Urology;  Laterality: Bilateral;  Bilateral   POLYPECTOMY     ROBOT ASSISTED LAPAROSCOPIC RADICAL PROSTATECTOMY  12/31/2011   Procedure: ROBOTIC ASSISTED LAPAROSCOPIC RADICAL PROSTATECTOMY;  Surgeon: Valetta Fuller, MD;  Location: [facility];  Service: Urology;  Laterality: N/A;       TONSILLECTOMY      Family History  Problem Relation Age of Onset   Colon cancer Mother 44   Colon cancer Father 94   Prostate cancer Paternal Grandfather    Esophageal cancer Paternal Grandfather    Stomach cancer Neg Hx    Rectal cancer Neg Hx     Medications- reviewed and updated Current Outpatient Medications  Medication Sig Dispense Refill   cholecalciferol (VITAMIN D3) 25 MCG (1000 UNIT) tablet Take 2,000 Units by mouth daily.     co-enzyme Q-10 50 MG capsule Take 200 mg by mouth daily.     Multiple Vitamins-Minerals (MULTIVITAMIN WITH MINERALS) tablet Take 1 tablet by mouth daily.     omeprazole (PRILOSEC) 20 MG capsule TAKE 1 CAPSULE EVERY DAY 90 capsule 3    rosuvastatin (CRESTOR) 10 MG tablet TAKE 1 TABLET EVERY DAY 90 tablet 3   warfarin (COUMADIN) 5 MG tablet TAKE 1 TABLET DAILY OR AS DIRECTED BY ANTICOAGULATION CLINIC 90 tablet 3   No current facility-administered medications for this visit.    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       Hobbies: travels fair amount has timeshare and also travels more than that, enjoys Presenter, broadcasting, home repair      Physician roster   Lina Sar - gastroenterology in past   Arlan Organ - hematology (released around 2016)   Dr. Marlou Porch (prior [place]) - urology   Dr. Elmer Picker - Ophthalmology   Dr. Margo Aye- dermatology   Objective  Objective:  BP 112/70   Pulse 73   Temp 97.7 F (36.5 C)   Ht 6' (1.829 m)   Wt 256 lb (116.1 kg)   SpO2 96%   BMI 34.72 kg/m  Gen: NAD, resting comfortably HEENT: Mucous membranes are moist. Oropharynx normal Neck: no thyromegaly CV: RRR no murmurs rubs or gallops Lungs: CTAB no crackles, wheeze, rhonchi Abdomen: soft/nontender/nondistended/normal bowel sounds. No rebound or guarding.  Ext: no edema Skin: warm, dry Neuro: grossly normal, moves all extremities, PERRLA Declines rectal or genitourinary exam    Assessment and Plan  78 y.o. male presenting for annual physical.  Health Maintenance counseling: 1. Anticipatory guidance: Patient counseled regarding regular dental exams -q6 months, eye exams -yearly,  avoiding smoking and second hand smoke , limiting alcohol to 2 beverages per day-rare social, no illicit drugs  2. Risk factor reduction:  Advised patient of need for regular exercise and diet rich and fruits and vegetables to reduce risk of heart attack and stroke.  Exercise- golds 3 days a week usually.  Diet/weight management-weight up 13 pounds from last August but stable from last physical in April  2022. Prior weight loss he felt related to pneumonia . Trying to cut back on portions- reducing quantity and trying to cut down evening snacking Wt Readings from Last 3 Encounters:  06/19/23 256 lb (116.1 kg)  02/13/23 242 lb (109.8 kg)  01/07/23 243 lb (110.2 kg)  3. Immunizations/screenings/ancillary studies- opts out of COVID-19 vaccination, discussed Shingrix option at pharmacy- otherwise up-to-date.  Did discuss will be due for tetanus shot very soon  Immunization History  Administered Date(s) Administered   Fluad Quad(high Dose 65+) 02/29/2020, 03/05/2021, 04/09/2022   Fluad Trivalent(High Dose 65+) 05/14/2023   Influenza Split 03/19/2011, 03/26/2012   Influenza  Whole 03/15/2008, 02/22/2009, 02/25/2010   Influenza, High Dose Seasonal PF 02/28/2015, 04/09/2016, 03/11/2017, 05/19/2018   Influenza,inj,Quad PF,6+ Mos 03/18/2013, 03/07/2014   Influenza,inj,quad, With Preservative 03/16/2020   Pneumococcal Conjugate-13 04/30/2015   Pneumococcal Polysaccharide-23 09/12/2013   Pneumococcal-Unspecified 03/16/2020   Td 11/01/2003, 09/12/2013   Zoster, Live 05/14/2011  4. Prostate cancer screening- prostatectomy after prior prostate cancer-still follows with Dr. Marlou Porch - low level recurrence with PSA 0.064- not being treated yet- just had done- follow up every 6 months Lab Results  Component Value Date   PSA 0.021 10/15/2020   PSA 0.015 10/18/2018   PSA 0.00 (L) 10/23/2015   5. Colon cancer screening - due for repeat colonoscopy but already scheduled with gastroenterology to discuss  6. Skin cancer screening-sees dermatology at least every few years- upcoming appointment. advised regular sunscreen use. Denies worrisome, changing, or new skin lesions.  7. Smoking associated screening (lung cancer screening, AAA screen 65-75, UA)-never smoker. Urine with urology 8. STD screening -only sexually active with wife  Status of chronic or acute concerns   # Chronic pulmonary embolism/DVT in  patient with lupus anticoagulant disorder # History of right heart strain due to submassive saddle pulmonary embolism previously-echocardiogram later showed resolution along with VQ scan S:Medication: Remains on long-term Coumadin monitored by [facility] with Carollee Herter -Patient requires Lovenox bridge if he is to have procedure/surgery.  Previously seen by pulmonary who has released him. -per hematology "During his last visit, I have recommended transition to coumadin since DOAC's are inferior to coumadin in APLS He was able to successfully transition to Coumadin with his primary care physician" A/P: chronic Pulmonary embolism/Deep vein thrombosis and lupus anticoagulant disorder all appropriately treated with coumadin- doing well without recurrence    # Hyperglycemia/insulin resistance/prediabetes-peak A1c of 6.2 in 2022 S:  Medication: None Lab Results  Component Value Date   HGBA1C 5.9 12/02/2022   HGBA1C 6.2 03/05/2021   HGBA1C 6.1 08/31/2020   A/P: hopefully stable- update a1c today. Continue without meds for now    # GERD-typically controlled with omeprazole 20 mg   #hyperlipidemia S: Medication:Rosuvastatin 10 mg daily along with coenzyme q10 Lab Results  Component Value Date   CHOL 122 12/02/2022   HDL 36.20 (L) 12/02/2022   LDLCALC 61 12/02/2022   LDLDIRECT 78 02/29/2020   TRIG 122.0 12/02/2022   CHOLHDL 3 12/02/2022  A/P: last #s looked excellent- update today  #Pneumonia July 2024-repeat September 6-improved aeration  -Was treated again September 2024 with Augmentin and azithromycin by virtual visit by colleague- he is doing well from breathing perspective     Recommended follow up: No follow-ups on file. Future Appointments  Date Time Provider Department Center  07/09/2023  2:00 PM Javier Glazier [facility]  07/28/2023  9:30 AM [facility] None    Lab/Order associations:NOT fasting   ICD-10-CM   1. Preventative  health care  Z00.00     2. Screening for diabetes mellitus  Z13.1     3. 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 Lupus anticoagulant disorder (HCC)  D68.62     5. History of prostate cancer  Z85.46     6. Hyperglycemia  R73.9     7. Primary hypercoagulable state (HCC)  D68.59     8. Hyperlipidemia, unspecified hyperlipidemia type  E78.5       No orders of the defined types were placed in this encounter.  Return precautions advised.  Tana Conch, MD

## 2023-07-07 DIAGNOSIS — X32XXXD Exposure to sunlight, subsequent encounter: Secondary | ICD-10-CM | POA: Diagnosis not present

## 2023-07-07 DIAGNOSIS — L820 Inflamed seborrheic keratosis: Secondary | ICD-10-CM | POA: Diagnosis not present

## 2023-07-07 DIAGNOSIS — C4441 Basal cell carcinoma of skin of scalp and neck: Secondary | ICD-10-CM | POA: Diagnosis not present

## 2023-07-07 DIAGNOSIS — L570 Actinic keratosis: Secondary | ICD-10-CM | POA: Diagnosis not present

## 2023-07-09 ENCOUNTER — Ambulatory Visit: Payer: Medicare HMO | Admitting: Physician Assistant

## 2023-07-28 ENCOUNTER — Ambulatory Visit (INDEPENDENT_AMBULATORY_CARE_PROVIDER_SITE_OTHER): Payer: Medicare HMO

## 2023-07-28 DIAGNOSIS — Z7901 Long term (current) use of anticoagulants: Secondary | ICD-10-CM | POA: Diagnosis not present

## 2023-07-28 LAB — POCT INR: INR: 2 (ref 2.0–3.0)

## 2023-07-28 NOTE — Patient Instructions (Addendum)
 Pre visit review using our clinic review tool, if applicable. No additional management support is needed unless otherwise documented below in the visit note.  Continue 1 tablet daily except take 1/2 tablet on Sundays and Wednesdays. Recheck in 5 weeks.

## 2023-07-28 NOTE — Progress Notes (Signed)
 Continue 1 tablet daily except take 1/2 tablet on Sundays and Wednesdays. Recheck in 5 weeks.

## 2023-07-30 ENCOUNTER — Telehealth: Payer: Self-pay | Admitting: Family Medicine

## 2023-07-30 NOTE — Telephone Encounter (Signed)
 Contacted pt and RS his coumadin clinic apt.

## 2023-07-30 NOTE — Telephone Encounter (Signed)
 Copied from CRM [id number]. Topic: Appointments - Appointment Cancel/Reschedule >> Jul 30, 2023 10:16 AM Truddie Crumble wrote: Patient/patient representative is calling to cancel or reschedule an appointment. Refer to attachments for appointment information.  Patient called stating he would like to speak to shannon regarding his 4/1 appointment for coumadin. Patient stated he would like to change the appoontment

## 2023-08-18 ENCOUNTER — Encounter: Payer: Self-pay | Admitting: Gastroenterology

## 2023-08-18 ENCOUNTER — Ambulatory Visit: Payer: Medicare HMO | Admitting: Gastroenterology

## 2023-08-18 ENCOUNTER — Telehealth: Payer: Self-pay | Admitting: *Deleted

## 2023-08-18 VITALS — BP 104/70 | HR 73 | Ht 72.0 in | Wt 260.8 lb

## 2023-08-18 DIAGNOSIS — D6862 Lupus anticoagulant syndrome: Secondary | ICD-10-CM

## 2023-08-18 DIAGNOSIS — Z800 Family history of malignant neoplasm of digestive organs: Secondary | ICD-10-CM

## 2023-08-18 DIAGNOSIS — Z86718 Personal history of other venous thrombosis and embolism: Secondary | ICD-10-CM

## 2023-08-18 DIAGNOSIS — Z860100 Personal history of colon polyps, unspecified: Secondary | ICD-10-CM | POA: Diagnosis not present

## 2023-08-18 DIAGNOSIS — Z09 Encounter for follow-up examination after completed treatment for conditions other than malignant neoplasm: Secondary | ICD-10-CM

## 2023-08-18 MED ORDER — NA SULFATE-K SULFATE-MG SULF 17.5-3.13-1.6 GM/177ML PO SOLN: 1.0000 | Freq: Once | ORAL | 0 refills | Status: AC

## 2023-08-18 NOTE — Telephone Encounter (Signed)
  Nicholas Owen [birth year] [id number]  08/18/2023   Dear Dr. Durene Cal:  We have scheduled the above named patient for a(n) colonoscopy procedure. Our records show that (s)he is on anticoagulation therapy.  Please advise as to whether the patient may come off their therapy of Coumadin 5 days prior to their procedure which is scheduled for 09/24/23.  Please route your response to Cristela Felt, CMA or fax response to [phone].  Sincerely,   Cristela Felt, [facility]

## 2023-08-18 NOTE — Telephone Encounter (Signed)
 Would require Lovenox bridge- shannon can you help set him up for that with coumadin clinic monitoring on INR?  I greatly appreciate your help

## 2023-08-18 NOTE — Patient Instructions (Addendum)
 You will be contaced by our office prior to your procedure for directions on holding your Coumadin/Warfarin.  If you do not hear from our office 1 week prior to your scheduled procedure, please call [phone] to discuss.   You have been scheduled for a colonoscopy. Please follow written instructions given to you at your visit today.   If you use inhalers (even only as needed), please bring them with you on the day of your procedure.  DO NOT TAKE 7 DAYS PRIOR TO TEST- Trulicity (dulaglutide) Ozempic, Wegovy (semaglutide) Mounjaro (tirzepatide) Bydureon Bcise (exanatide extended release)  DO NOT TAKE 1 DAY PRIOR TO YOUR TEST Rybelsus (semaglutide) Adlyxin (lixisenatide) Victoza (liraglutide) Byetta (exanatide) _______________________________________________________  If your blood pressure at your visit was 140/90 or greater, please contact your primary care physician to follow up on this.  _______________________________________________________  If you are age 70 or older, your body mass index should be between 23-30. Your Body mass index is 35.37 kg/m. If this is out of the aforementioned range listed, please consider follow up with your Primary Care Provider.  If you are age 61 or younger, your body mass index should be between 19-25. Your Body mass index is 35.37 kg/m. If this is out of the aformentioned range listed, please consider follow up with your Primary Care Provider.   ________________________________________________________  The  GI providers would like to encourage you to use [facility] to communicate with providers for non-urgent requests or questions.  Due to long hold times on the telephone, sending your provider a message by [facility] may be a faster and more efficient way to get a response.  Please allow 48 business hours for a response.  Please remember that this is for non-urgent requests.  _______________________________________________________

## 2023-08-18 NOTE — Progress Notes (Signed)
 08/18/2023 Nicholas Owen [id number] 17-Jul-1945   HISTORY OF PRESENT ILLNESS: This is a 78 year old male who is a patient of Dr. Irving Burton, known to him only for colonoscopies.  He has a family history of colon cancer in both his mother and his father, his father diagnosed at age 23 his mother diagnosed at age 87.  He also has a personal history of adenomatous colon polyps.  Last colonoscopy October 2019 as below.  Colonoscopy October 2019:  - Diverticulosis in the left colon. - Tortuous colon. - Two 4 to 6 mm polyps in the ascending colon, removed with a cold snare. Resected and retrieved. - The examination was otherwise normal on direct and retroflexion views.  Pathology showed tubular adenomas.  He is here to schedule his routine surveillance colonoscopy.  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  He has no GI complaints today.  Feels well.  Moves his bowels well.  No rectal bleeding.   Past Medical History:  Diagnosis Date   Allergy    Asbestos exposure    Basal cell carcinoma    skin of left ear   BPH (benign prostatic hyperplasia)    Cancer (HCC)    basal cell CA (skin)   Clotting disorder (HCC) 2021   Colitis 01/2011   Colon polyp    Deviated nasal septum    hx. 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 Prostate cancer after bx.   Ulcerative colitis [facility])    Past Surgical History:  Procedure Laterality Date   APPENDECTOMY  2022   COLONOSCOPY     CYSTOSCOPY  02/16/2012   Procedure: CYSTOSCOPY FLEXIBLE;  Surgeon: Valetta Fuller, MD;  Location: [facility];  Service: Urology;   Laterality: N/A;  FLEX CYSTO WITH REMOVAL OF STAPLES    INGUINAL HERNIA REPAIR  01/31/1989   double   KNEE SURGERY  06/02/2005   right knee scope   LAPAROSCOPIC APPENDECTOMY N/A 09/15/2019   Procedure: APPENDECTOMY LAPAROSCOPIC;  Surgeon: Gaynelle Adu, MD;  Location: [facility];  Service: General;  Laterality: N/A;   lipoma removals  01/31/1989   multiple and multiple remains now   LYMPH NODE DISSECTION  12/31/2011   Procedure: LYMPH NODE DISSECTION;  Surgeon: Valetta Fuller, MD;  Location: [facility];  Service: Urology;  Laterality: Bilateral;  Bilateral   POLYPECTOMY     ROBOT ASSISTED LAPAROSCOPIC RADICAL PROSTATECTOMY  12/31/2011   Procedure: ROBOTIC ASSISTED LAPAROSCOPIC RADICAL PROSTATECTOMY;  Surgeon: Valetta Fuller, MD;  Location: [facility];  Service: Urology;  Laterality: N/A;       TONSILLECTOMY      reports that he has never smoked. He has never used smokeless tobacco. He reports that he does not currently use alcohol. He reports that he does not use drugs. family history includes Cancer in his father, mother, and paternal grandfather; Colon cancer (age of onset: 20) in his father; Colon cancer (age of onset: 19) in his mother; Early death in his father and mother; Esophageal cancer in his paternal grandfather; Obesity  in his father; Prostate cancer in his paternal grandfather; Vision loss in his paternal grandfather. No Known Allergies    Outpatient Encounter Medications as of 08/18/2023  Medication Sig   cholecalciferol (VITAMIN D3) 25 MCG (1000 UNIT) tablet Take 2,000 Units by mouth daily.   co-enzyme Q-10 50 MG capsule Take 200 mg by mouth daily.   Multiple Vitamins-Minerals (MULTIVITAMIN WITH MINERALS) tablet Take 1 tablet by mouth daily.   omeprazole (PRILOSEC) 20 MG capsule TAKE 1 CAPSULE EVERY DAY   rosuvastatin (CRESTOR) 10 MG tablet TAKE 1 TABLET EVERY DAY   warfarin (COUMADIN) 5 MG tablet TAKE 1 TABLET DAILY OR AS DIRECTED BY ANTICOAGULATION CLINIC   No  facility-administered encounter medications on file as of 08/18/2023.    REVIEW OF SYSTEMS  : All other systems reviewed and negative except where noted in the History of Present Illness.   PHYSICAL EXAM: BP 104/70   Pulse 73   Ht 6' (1.829 m)   Wt 260 lb 12.8 oz (118.3 kg)   BMI 35.37 kg/m  General: Well developed white male in no acute distress Head: Normocephalic and atraumatic Eyes:  Sclerae anicteric, conjunctiva pink. Ears: Normal auditory acuity Lungs: Clear throughout to auscultation; no W/R/R. Heart: Regular rate and rhythm; no M/R/G. Rectal: Will be done at the time of colonoscopy. Musculoskeletal: Symmetrical with no gross deformities  Skin: No lesions on visible extremities Extremities: No edema  Neurological: Alert oriented x 4, grossly non-focal Psychological:  Alert and cooperative. Normal mood and affect  ASSESSMENT AND PLAN: *Family history of colon cancer in both of his parents and personal history of colon polyps, last colonoscopy October 2019.  Will schedule for colonoscopy with Dr. Myrtie Neither. *Chronic anticoagulation with Coumadin due to history of lupus anticoagulant syndrome with DVTs and PEs.  Will check with his PCP, Dr. Durene Cal as well as Coumadin clinic in regards to holding that, but looks like they recommended Lovenox bridging so hopefully they will help [place] out with that.   CC:  Shelva Majestic, MD

## 2023-08-19 NOTE — Telephone Encounter (Signed)
 Will discuss with pt at next coumadin clinic apt on 4/1.

## 2023-08-19 NOTE — Progress Notes (Signed)
 ____________________________________________________________  Attending physician addendum:  Thank you for sending this case to me. I have reviewed the entire note and agree with the plan.  The provider that manages his oral anticoagulation would need to also manage any necessary bridging Lovenox plan for this patient.  Amada Jupiter, MD  ____________________________________________________________

## 2023-08-19 NOTE — Telephone Encounter (Signed)
 Indication for anticoagulation; hx of PE (2013 & 2022) & APS Surgery; colonoscopy on 09/24/23  Current warfarin dosing; 5 mg (1 tablet) daily except take 2.5 mg (1/2 tablet) on Sunday and Wednesday  Current wt: 118.3 kg Ideal wt: 78 kg (151% >actual wt) Adjusted wt: 94 kg  CrCl: 94.95 mL/min (using adjusted wt due to actual wt 151% > ideal wt)  Lovenox: 1.5 mg/kg Q24H=180 mg Q24H or 120 mg Q12H (CrCl is acceptable for once daily dosing)  Lovenox bridge;  4/19: Take last dose of warfarin 4/20: NO warfarin, NO Lovenox 4/21: NO warfarin, inject Lovenox once in the AM 4/22: NO warfarin, inject Lovenox once in the AM 4/23: NO warfarin, inject Lovenox once in the AM (BEFORE 7 AM)  4/24: SURGERY; NO WARFARIN, NO LOVENOX  4/25: Take 1 1/2 tablets (7.5 mg) warfarin, inject Lovenox once in the AM 4/26: Take 1 1/2 tablets (7.5 mg) warfarin, inject Lovenox once in the AM 4/27: Take 1 tablet (5 mg) warfarin, inject Lovenox once in the AM 4/28: Take 1 1/2 tablets (7.5 mg) warfarin, inject Lovenox once in the AM 4/29: Take 1 tablet (5 mg) warfarin, inject Lovenox once in the AM 4/30: Resume normal warfarin dosing and stop Lovenox injections  5/2: Recheck INR; no warfarin until after INR check

## 2023-08-19 NOTE — Telephone Encounter (Signed)
 This looks great-thank so much

## 2023-09-01 ENCOUNTER — Ambulatory Visit (INDEPENDENT_AMBULATORY_CARE_PROVIDER_SITE_OTHER): Payer: Medicare HMO

## 2023-09-01 ENCOUNTER — Ambulatory Visit: Payer: Medicare HMO

## 2023-09-01 DIAGNOSIS — Z7901 Long term (current) use of anticoagulants: Secondary | ICD-10-CM | POA: Diagnosis not present

## 2023-09-01 DIAGNOSIS — Z08 Encounter for follow-up examination after completed treatment for malignant neoplasm: Secondary | ICD-10-CM | POA: Diagnosis not present

## 2023-09-01 DIAGNOSIS — L728 Other follicular cysts of the skin and subcutaneous tissue: Secondary | ICD-10-CM | POA: Diagnosis not present

## 2023-09-01 DIAGNOSIS — X32XXXD Exposure to sunlight, subsequent encounter: Secondary | ICD-10-CM | POA: Diagnosis not present

## 2023-09-01 DIAGNOSIS — L570 Actinic keratosis: Secondary | ICD-10-CM | POA: Diagnosis not present

## 2023-09-01 DIAGNOSIS — Z85828 Personal history of other malignant neoplasm of skin: Secondary | ICD-10-CM | POA: Diagnosis not present

## 2023-09-01 LAB — POCT INR: INR: 2 (ref 2.0–3.0)

## 2023-09-01 MED ORDER — ENOXAPARIN SODIUM 80 MG/0.8ML IJ SOSY: 80.0000 mg | PREFILLED_SYRINGE | INTRAMUSCULAR | 0 refills | Status: DC

## 2023-09-01 MED ORDER — ENOXAPARIN SODIUM 100 MG/ML IJ SOSY: 100.0000 mg | PREFILLED_SYRINGE | INTRAMUSCULAR | 0 refills | Status: DC

## 2023-09-01 MED ORDER — ENOXAPARIN SODIUM 80 MG/0.8ML IJ SOSY
80.0000 mg | PREFILLED_SYRINGE | INTRAMUSCULAR | 0 refills | Status: DC
Start: 2023-09-01 — End: 2023-09-01

## 2023-09-01 MED ORDER — ENOXAPARIN SODIUM 100 MG/ML IJ SOSY
100.0000 mg | PREFILLED_SYRINGE | INTRAMUSCULAR | 0 refills | Status: DC
Start: 2023-09-01 — End: 2023-09-01

## 2023-09-01 NOTE — Patient Instructions (Addendum)
 Pre visit review using our clinic review tool, if applicable. No additional management support is needed unless otherwise documented below in the visit note.  Continue 1 tablet daily except take 1/2 tablet on Sundays and Wednesdays until starting instructions below for your Lovenox bridge. 4/19: Take last dose of warfarin 4/20: NO warfarin, NO Lovenox 4/21: NO warfarin, inject Lovenox once in the AM 4/22: NO warfarin, inject Lovenox once in the AM 4/23: NO warfarin, inject Lovenox once in the AM (BEFORE 7 AM)  4/24: SURGERY; NO WARFARIN, NO LOVENOX  4/25: Take 1 1/2 tablets (7.5 mg) warfarin, inject Lovenox once in the AM 4/26: Take 1 1/2 tablets (7.5 mg) warfarin, inject Lovenox once in the AM 4/27: Take 1 tablet (5 mg) warfarin, inject Lovenox once in the AM 4/28: Take 1 1/2 tablets (7.5 mg) warfarin, inject Lovenox once in the AM 4/29: Take 1 tablet (5 mg) warfarin, inject Lovenox once in the AM 4/30: Resume normal warfarin dosing and stop Lovenox injections 5/2: Recheck INR

## 2023-09-01 NOTE — Progress Notes (Signed)
 Pt is having colonoscopy for 4/24 and will be on a Lovenox bridge. Continue 1 tablet daily except take 1/2 tablet on Sundays and Wednesdays until starting instructions below for your Lovenox bridge. 4/19: Take last dose of warfarin 4/20: NO warfarin, NO Lovenox 4/21: NO warfarin, inject Lovenox once in the AM 4/22: NO warfarin, inject Lovenox once in the AM 4/23: NO warfarin, inject Lovenox once in the AM (BEFORE 7 AM)  4/24: SURGERY; NO WARFARIN, NO LOVENOX  4/25: Take 1 1/2 tablets (7.5 mg) warfarin, inject Lovenox once in the AM 4/26: Take 1 1/2 tablets (7.5 mg) warfarin, inject Lovenox once in the AM 4/27: Take 1 tablet (5 mg) warfarin, inject Lovenox once in the AM 4/28: Take 1 1/2 tablets (7.5 mg) warfarin, inject Lovenox once in the AM 4/29: Take 1 tablet (5 mg) warfarin, inject Lovenox once in the AM 4/30: Resume normal warfarin dosing and stop Lovenox injections 5/2: Recheck INR  Explained to pt he will need to use two Lovenox syringes daily to make up the total mg he will need, which is 180 mg daily. Pt verbalized understanding.   [employer]me [employer], [employer] [facility] and spoke with Thayer Ohm, pharmacist, and advised need for 180 mg Lovenox total daily. He will order 100 mg and 80 mg syringes. Sent in scripts but they printed. Reordered to go electronically.

## 2023-09-11 ENCOUNTER — Other Ambulatory Visit (HOSPITAL_COMMUNITY): Payer: Self-pay

## 2023-09-17 ENCOUNTER — Encounter: Payer: Self-pay | Admitting: Gastroenterology

## 2023-09-22 ENCOUNTER — Encounter: Payer: Self-pay | Admitting: Certified Registered Nurse Anesthetist

## 2023-09-24 ENCOUNTER — Encounter: Payer: Self-pay | Admitting: Gastroenterology

## 2023-09-24 ENCOUNTER — Other Ambulatory Visit: Payer: Self-pay | Admitting: Gastroenterology

## 2023-09-24 ENCOUNTER — Ambulatory Visit: Admitting: Gastroenterology

## 2023-09-24 VITALS — BP 113/80 | HR 73 | Temp 98.6°F | Resp 19 | Ht 72.0 in | Wt 260.0 lb

## 2023-09-24 DIAGNOSIS — Z860101 Personal history of adenomatous and serrated colon polyps: Secondary | ICD-10-CM | POA: Diagnosis not present

## 2023-09-24 DIAGNOSIS — Z1211 Encounter for screening for malignant neoplasm of colon: Secondary | ICD-10-CM

## 2023-09-24 DIAGNOSIS — K6389 Other specified diseases of intestine: Secondary | ICD-10-CM

## 2023-09-24 DIAGNOSIS — K5730 Diverticulosis of large intestine without perforation or abscess without bleeding: Secondary | ICD-10-CM

## 2023-09-24 DIAGNOSIS — E785 Hyperlipidemia, unspecified: Secondary | ICD-10-CM | POA: Diagnosis not present

## 2023-09-24 DIAGNOSIS — Q439 Congenital malformation of intestine, unspecified: Secondary | ICD-10-CM

## 2023-09-24 DIAGNOSIS — Z800 Family history of malignant neoplasm of digestive organs: Secondary | ICD-10-CM

## 2023-09-24 DIAGNOSIS — D123 Benign neoplasm of transverse colon: Secondary | ICD-10-CM | POA: Diagnosis not present

## 2023-09-24 DIAGNOSIS — Z860100 Personal history of colon polyps, unspecified: Secondary | ICD-10-CM

## 2023-09-24 MED ORDER — SODIUM CHLORIDE 0.9 % IV SOLN: 500.0000 mL | INTRAVENOUS | Status: DC

## 2023-09-24 NOTE — Progress Notes (Signed)
 1415 Spoke with patient about him vomiting during procedure, told him vomit came out of mouth and nose, may feel burning the rest of day.   Gave patient drink of water without any problems.  Denies any SOB or chest pain, just feels "congested." vss

## 2023-09-24 NOTE — Patient Instructions (Signed)
 RESUME anticoagulants (Lovenox  and Coumadin ) at doses and on schedule recommended to patient by the anticoagulation clinic/provider.)  Clip Card given to patient.  No repeat routine screening/surveillance colonoscopy recommended for patient.  YOU HAD AN ENDOSCOPIC PROCEDURE TODAY AT THE  ENDOSCOPY CENTER:   Refer to the procedure report that was given to you for any specific questions about what was found during the examination.  If the procedure report does not answer your questions, please call your gastroenterologist to clarify.  If you requested that your care partner not be given the details of your procedure findings, then the procedure report has been included in a sealed envelope for you to review at your convenience later.  YOU SHOULD EXPECT: Some feelings of bloating in the abdomen. Passage of more gas than usual.  Walking can help get rid of the air that was put into your GI tract during the procedure and reduce the bloating. If you had a lower endoscopy (such as a colonoscopy or flexible sigmoidoscopy) you may notice spotting of blood in your stool or on the toilet paper. If you underwent a bowel prep for your procedure, you may not have a normal bowel movement for a few days.  Please Note:  You might notice some irritation and congestion in your nose or some drainage.  This is from the oxygen  used during your procedure.  There is no need for concern and it should clear up in a day or so.  SYMPTOMS TO REPORT IMMEDIATELY:  Following lower endoscopy (colonoscopy or flexible sigmoidoscopy):  Excessive amounts of blood in the stool  Significant tenderness or worsening of abdominal pains  Swelling of the abdomen that is new, acute  Fever of 100F or higher  For urgent or emergent issues, a gastroenterologist can be reached at any hour by calling [phone]. Do not use MyChart messaging for urgent concerns.    DIET:  We do recommend a small meal at first, but then you may  proceed to your regular diet.  Drink plenty of fluids but you should avoid alcoholic beverages for 24 hours.  ACTIVITY:  You should plan to take it easy for the rest of today and you should NOT DRIVE or use heavy machinery until tomorrow (because of the sedation medicines used during the test).    FOLLOW UP: Our staff will call the number listed on your records the next business day following your procedure.  We will call around 7:15- 8:00 am to check on you and address any questions or concerns that you may have regarding the information given to you following your procedure. If we do not reach you, we will leave a message.     If any biopsies were taken you will be contacted by phone or by letter within the next 1-3 weeks.  Please call us  at [phone] if you have not heard about the biopsies in 3 weeks.    SIGNATURES/CONFIDENTIALITY: You and/or your care partner have signed paperwork which will be entered into your electronic medical record.  These signatures attest to the fact that that the information above on your After Visit Summary has been reviewed and is understood.  Full responsibility of the confidentiality of this discharge information lies with you and/or your care-partner.

## 2023-09-24 NOTE — Progress Notes (Signed)
 1407 Patient awake and following commands, denies chest pain or SOB.  States "feel congested." vss

## 2023-09-24 NOTE — Progress Notes (Signed)
 History and Physical:  This patient presents for endoscopic testing for: Encounter Diagnoses  Name Primary?   Hx of colonic polyps Yes   Family history of colon cancer     Surveillance colonoscopy today for TA polyps Oct 2019 and family Hx CRC in both parents. Patient denies chronic abdominal pain, rectal bleeding, constipation or diarrhea.   Patient is otherwise without complaints or active issues today.   Past Medical History: Past Medical History:  Diagnosis Date   Allergy    Asbestos exposure    Basal cell carcinoma    skin of left ear   BPH (benign prostatic hyperplasia)    Cancer (HCC)    basal cell CA (skin)   Clotting disorder (HCC) 2021   Colitis 01/2011   Colon polyp    Deviated nasal septum    hx. 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 Prostate cancer after bx.   Ulcerative colitis [facility])      Past Surgical History: Past Surgical History:  Procedure Laterality Date   APPENDECTOMY  2022   COLONOSCOPY     CYSTOSCOPY  02/16/2012   Procedure: CYSTOSCOPY FLEXIBLE;  Surgeon: Livingston Rigg, MD;  Location: [facility];  Service: Urology;  Laterality: N/A;  FLEX CYSTO WITH REMOVAL OF STAPLES    INGUINAL HERNIA REPAIR  01/31/1989   double   KNEE SURGERY  06/02/2005   right knee scope   LAPAROSCOPIC APPENDECTOMY N/A 09/15/2019   Procedure: APPENDECTOMY LAPAROSCOPIC;  Surgeon: Aldean Hummingbird, MD;  Location: [facility];  Service: General;  Laterality: N/A;   lipoma removals  01/31/1989   multiple and multiple remains now   LYMPH NODE DISSECTION  12/31/2011   Procedure: LYMPH NODE DISSECTION;  Surgeon: Livingston Rigg,  MD;  Location: [facility];  Service: Urology;  Laterality: Bilateral;  Bilateral   POLYPECTOMY     ROBOT ASSISTED LAPAROSCOPIC RADICAL PROSTATECTOMY  12/31/2011   Procedure: ROBOTIC ASSISTED LAPAROSCOPIC RADICAL PROSTATECTOMY;  Surgeon: Livingston Rigg, MD;  Location: [facility];  Service: Urology;  Laterality: N/A;       TONSILLECTOMY      Allergies: No Known Allergies  Outpatient Meds: Current Outpatient Medications  Medication Sig Dispense Refill   cholecalciferol (VITAMIN D3) 25 MCG (1000 UNIT) tablet Take 2,000 Units by mouth daily.     co-enzyme Q-10 50 MG capsule Take 200 mg by mouth daily.     enoxaparin  (LOVENOX ) 100 MG/ML injection Inject 1 mL (100 mg total) into the skin daily. USE AS DIRECTED BY ANTICOAGULATION CLINIC 8 mL 0   enoxaparin  (LOVENOX ) 80 MG/0.8ML injection Inject 0.8 mLs (80 mg total) into the skin daily. USE AS DIRECTED BY ANTICOAGULATION CLINIC 6.4 mL 0   Multiple Vitamins-Minerals (MULTIVITAMIN WITH MINERALS) tablet Take 1 tablet by mouth daily.     omeprazole  (PRILOSEC) 20 MG capsule TAKE 1 CAPSULE EVERY DAY 90 capsule 3   rosuvastatin  (CRESTOR ) 10 MG tablet TAKE 1 TABLET EVERY DAY 90 tablet 3   sildenafil (VIAGRA) 100 MG tablet      warfarin (COUMADIN ) 5 MG tablet TAKE 1 TABLET  DAILY OR AS DIRECTED BY ANTICOAGULATION CLINIC 90 tablet 3   Current Facility-Administered Medications  Medication Dose Route Frequency Provider Last Rate Last Admin   0.9 %  sodium chloride  infusion  500 mL Intravenous Continuous Danis, Roel Clarity III, MD          ___________________________________________________________________ Objective   Exam:  BP (!) 144/102   Pulse 66   Temp 98.6 F (37 C) (Temporal)   Ht 6' (1.829 m)   Wt 260 lb (117.9 kg)   SpO2 94%   BMI 35.26 kg/m   CV: regular , S1/S2 Resp: clear to auscultation bilaterally, normal RR and effort noted GI: soft, no tenderness, with active bowel sounds.   Assessment: Encounter Diagnoses  Name Primary?   Hx of  colonic polyps Yes   Family history of colon cancer      Plan: Colonoscopy   The benefits and risks of the planned procedure(s) were described in detail with the patient or (when appropriate) their health care proxy.  Risks were outlined as including, but not limited to, bleeding, infection, perforation, adverse medication reaction leading to cardiac or pulmonary decompensation, pancreatitis (if ERCP).  The limitation of incomplete mucosal visualization was also discussed.  No guarantees or warranties were given.  The patient is appropriate for an endoscopic procedure in the ambulatory setting.   - Lorella Roles, MD

## 2023-09-24 NOTE — Progress Notes (Signed)
 Called to room to assist during endoscopic procedure.  Patient ID and intended procedure confirmed with present staff. Received instructions for my participation in the procedure from the performing physician.

## 2023-09-24 NOTE — Progress Notes (Signed)
 1413 Report given to PACU, vss

## 2023-09-24 NOTE — Progress Notes (Signed)
 1354 Pt actively vomiting with vomitus coming out mouth and nares. Suctioning and airway management being performed. Zofran  4 mg IV given and Robinul  0.2 mg IV given due large amount of secretions upon assessment.  O 2 sat decreasing with jaw thrust performed, CO2 monitoring stop due to water trap full of vomitus.

## 2023-09-24 NOTE — Op Note (Signed)
 [facility] Patient Name: Nicholas Owen Procedure Date: 09/24/2023 1:25 PM MRN: [id number] Endoscopist: Ace Abu L. Dominic Friendly , MD, [id number] Age: 78 Referring MD:  Date of Birth: February 08, 1946 Gender: Male Account #: [id number] Procedure:                Colonoscopy Indications:              Screening patient at increased risk: Family history                            of colorectal cancer in multiple 1st-degree                            relatives (both parents), Surveillance: Personal                            history of adenomatous polyps on last colonoscopy >                            5 years ago                           two subcentimeter tubular adenomas October 2019 no                            adenomas or SSP in 2014 Medicines:                Monitored Anesthesia Care Procedure:                Pre-Anesthesia Assessment:                           - Prior to the procedure, a History and Physical                            was performed, and patient medications and                            allergies were reviewed. [name] tolerance of                            previous anesthesia was also reviewed. The risks                            and benefits of the procedure and the sedation                            options and risks were discussed with the patient.                            All questions were answered, and informed consent                            was obtained. Prior Anticoagulants: The patient  last took Coumadin  (warfarin) 5 days and Lovenox                             (enoxaparin ) 1 day prior to the procedure. ASA                            Grade Assessment: II - A patient with mild systemic                            disease. After reviewing the risks and benefits,                            the patient was deemed in satisfactory condition to                            undergo the procedure.                           After  obtaining informed consent, the colonoscope                            was passed under direct vision. Throughout the                            procedure, the patient's blood pressure, pulse, and                            oxygen  saturations were monitored continuously. The                            CF [id number] #[id number] was introduced through the anus                            and advanced to the the cecum, identified by                            appendiceal orifice and ileocecal valve. The                            colonoscopy was performed with difficulty due to                            multiple diverticula in the colon, a redundant                            colon and a tortuous colon. Successful completion                            of the procedure was aided by using manual                            pressure, straightening and shortening the scope to  obtain bowel loop reduction and lavage. The patient                            tolerated the procedure poorly. Scope pressure and                            a redundant colon led the patient to vomit during                            scope withdrawal. He was quickly suctioned. This                            led to upper airway spasm, coughing and the need                            for upper airway support. Scope In: 1:44:54 PM Scope Out: 1:58:38 PM Scope Withdrawal Time: 0 hours 6 minutes 42 seconds  Total Procedure Duration: 0 hours 13 minutes 44 seconds  Findings:                 The perianal and digital rectal examinations were                            normal.                           Repeat examination of right colon under NBI                            performed.                           A localized area of erythematous mucosa was found                            at the ileocecal valve. (Prep artifact-see photo)                           A 6 mm polyp was found in the transverse colon. The                             polyp was semi-pedunculated. The polyp was removed                            with a cold snare (during scope insertion).                            Resection and retrieval were complete. Site                            reexamined during scope withdrawal and there was no                            active bleeding. However, due to the appearance of  the polypectomy site and need for patient to                            immediately resume anticoagulation, to prevent                            bleeding post-intervention, one hemostatic clip was                            successfully placed (MR conditional). Clip                            manufacturer: [employer].                           The left colon was tortuous and redundant, making                            scope passage challenging..                           Multiple diverticula were found in the left colon.                           The exam was otherwise without abnormality. Complications:            No immediate complications. Estimated Blood Loss:     Estimated blood loss was minimal. Impression:               - One 6 mm polyp in the transverse colon, removed                            with a cold snare. Resected and retrieved. Clip (MR                            conditional) was placed.                           - Tortuous colon.                           - Diverticulosis in the left colon.                           - The examination was otherwise normal. Recommendation:           - Patient has a contact number available for                            emergencies. The signs and symptoms of potential                            delayed complications were discussed with the                            patient. Return to normal activities tomorrow.  Written discharge instructions were provided to the                            patient.                            - Resume previous diet.                           - Resume anticoagulants (Lovenox  and Coumadin ) at                            doses and on schedule recommended to patient by the                            anticoagulation clinic/provider).                           - Await pathology results.                           - No repeat routine screening/surveillance                            colonoscopy recommended for this patient. Despite                            family history of colon cancer and personal polyp                            history, this patient's age, medical conditions and                            increasingly challenging scope passage as well as                            today's procedural experience suggest that the risk                            of further surveillance exams outweigh the expected                            benefit. Saran Laviolette L. Dominic Friendly, MD 09/24/2023 2:14:25 PM This report has been signed electronically.

## 2023-09-24 NOTE — Progress Notes (Signed)
 1400 Pt repositioned to back, changed due wetting self, and albuterol  neb. vss

## 2023-09-25 ENCOUNTER — Telehealth: Payer: Self-pay

## 2023-09-25 NOTE — Telephone Encounter (Signed)
  Follow up Call-     09/24/2023    1:02 PM  Call back number  Post procedure Call Back phone  # [phone]  Permission to leave phone message Yes     Patient questions:  Do you have a fever, pain , or abdominal swelling? No. Pain Score  0 *  Have you tolerated food without any problems? Yes.    Have you been able to return to your normal activities? Yes.    Do you have any questions about your discharge instructions: Diet   No. Medications  No. Follow up visit  No.  Do you have questions or concerns about your Care? No.  Actions: * If pain score is 4 or above: No action needed, pain <4.

## 2023-09-29 ENCOUNTER — Encounter: Payer: Self-pay | Admitting: Gastroenterology

## 2023-09-29 LAB — SURGICAL PATHOLOGY

## 2023-10-02 ENCOUNTER — Ambulatory Visit (INDEPENDENT_AMBULATORY_CARE_PROVIDER_SITE_OTHER)

## 2023-10-02 DIAGNOSIS — Z7901 Long term (current) use of anticoagulants: Secondary | ICD-10-CM | POA: Diagnosis not present

## 2023-10-02 LAB — POCT INR: INR: 1.9 — AB (ref 2.0–3.0)

## 2023-10-02 NOTE — Patient Instructions (Addendum)
 Pre visit review using our clinic review tool, if applicable. No additional management support is needed unless otherwise documented below in the visit note.  Increase dose today to take 1 1/2 tablets and then continue 1 tablet daily except take 1/2 tablet on Sundays and Wednesdays. Recheck in 2 weeks.

## 2023-10-02 NOTE — Progress Notes (Signed)
 Pt had a colonoscopy on 4/24 and was on a Lovenox  bridge. Increase dose today to take 1 1/2 tablets and then continue 1 tablet daily except take 1/2 tablet on Sundays and Wednesdays. Recheck in 2 weeks.

## 2023-10-05 ENCOUNTER — Other Ambulatory Visit: Payer: Self-pay | Admitting: Family Medicine

## 2023-10-05 DIAGNOSIS — Z7901 Long term (current) use of anticoagulants: Secondary | ICD-10-CM

## 2023-10-16 ENCOUNTER — Ambulatory Visit

## 2023-10-16 DIAGNOSIS — Z7901 Long term (current) use of anticoagulants: Secondary | ICD-10-CM | POA: Diagnosis not present

## 2023-10-16 LAB — POCT INR: INR: 2.5 (ref 2.0–3.0)

## 2023-10-16 NOTE — Progress Notes (Signed)
Continue 1 tablet daily except take 1/2 tablet on Sundays and Wednesdays . Recheck in 4 weeks.

## 2023-10-16 NOTE — Patient Instructions (Addendum)
Pre visit review using our clinic review tool, if applicable. No additional management support is needed unless otherwise documented below in the visit note.  Continue 1 tablet daily except take 1/2 tablet on Sundays and Wednesdays . Recheck in 4 weeks.

## 2023-11-13 ENCOUNTER — Ambulatory Visit (INDEPENDENT_AMBULATORY_CARE_PROVIDER_SITE_OTHER)

## 2023-11-13 DIAGNOSIS — Z7901 Long term (current) use of anticoagulants: Secondary | ICD-10-CM | POA: Diagnosis not present

## 2023-11-13 LAB — POCT INR: INR: 2.5 (ref 2.0–3.0)

## 2023-11-13 NOTE — Progress Notes (Signed)
Continue 1 tablet daily except take 1/2 tablet on Sundays and Wednesdays. Recheck in 6 weeks.

## 2023-11-13 NOTE — Patient Instructions (Addendum)
Pre visit review using our clinic review tool, if applicable. No additional management support is needed unless otherwise documented below in the visit note.  Continue 1 tablet daily except take 1/2 tablet on Sundays and Wednesdays. Recheck in 6 weeks.

## 2023-11-16 DIAGNOSIS — Z8546 Personal history of malignant neoplasm of prostate: Secondary | ICD-10-CM | POA: Diagnosis not present

## 2023-11-23 DIAGNOSIS — N393 Stress incontinence (female) (male): Secondary | ICD-10-CM | POA: Diagnosis not present

## 2023-11-23 DIAGNOSIS — Z8546 Personal history of malignant neoplasm of prostate: Secondary | ICD-10-CM | POA: Diagnosis not present

## 2023-12-22 ENCOUNTER — Other Ambulatory Visit: Payer: Self-pay | Admitting: Family Medicine

## 2023-12-23 ENCOUNTER — Encounter: Payer: Self-pay | Admitting: Family

## 2023-12-23 ENCOUNTER — Ambulatory Visit (INDEPENDENT_AMBULATORY_CARE_PROVIDER_SITE_OTHER): Admitting: Family

## 2023-12-23 VITALS — BP 122/82 | HR 75 | Temp 98.4°F | Ht 72.0 in | Wt 261.4 lb

## 2023-12-23 DIAGNOSIS — R32 Unspecified urinary incontinence: Secondary | ICD-10-CM | POA: Insufficient documentation

## 2023-12-23 DIAGNOSIS — R7303 Prediabetes: Secondary | ICD-10-CM | POA: Diagnosis not present

## 2023-12-23 DIAGNOSIS — T675XXA Heat exhaustion, unspecified, initial encounter: Secondary | ICD-10-CM

## 2023-12-23 DIAGNOSIS — R5081 Fever presenting with conditions classified elsewhere: Secondary | ICD-10-CM | POA: Diagnosis not present

## 2023-12-23 DIAGNOSIS — I8000 Phlebitis and thrombophlebitis of superficial vessels of unspecified lower extremity: Secondary | ICD-10-CM | POA: Insufficient documentation

## 2023-12-23 DIAGNOSIS — J3089 Other allergic rhinitis: Secondary | ICD-10-CM | POA: Insufficient documentation

## 2023-12-23 DIAGNOSIS — C61 Malignant neoplasm of prostate: Secondary | ICD-10-CM | POA: Insufficient documentation

## 2023-12-23 DIAGNOSIS — I471 Supraventricular tachycardia, unspecified: Secondary | ICD-10-CM | POA: Insufficient documentation

## 2023-12-23 DIAGNOSIS — N201 Calculus of ureter: Secondary | ICD-10-CM | POA: Insufficient documentation

## 2023-12-23 DIAGNOSIS — C4491 Basal cell carcinoma of skin, unspecified: Secondary | ICD-10-CM | POA: Insufficient documentation

## 2023-12-23 LAB — COMPREHENSIVE METABOLIC PANEL WITH GFR
ALT: 57 U/L — ABNORMAL HIGH (ref 0–53)
AST: 39 U/L — ABNORMAL HIGH (ref 0–37)
Albumin: 3.7 g/dL (ref 3.5–5.2)
Alkaline Phosphatase: 71 U/L (ref 39–117)
BUN: 19 mg/dL (ref 6–23)
CO2: 25 meq/L (ref 19–32)
Calcium: 8.6 mg/dL (ref 8.4–10.5)
Chloride: 104 meq/L (ref 96–112)
Creatinine, Ser: 1.06 mg/dL (ref 0.40–1.50)
GFR: 68.18 mL/min (ref >60.00)
Glucose, Bld: 116 mg/dL — ABNORMAL HIGH (ref 70–99)
Potassium: 3.9 meq/L (ref 3.5–5.1)
Sodium: 137 meq/L (ref 135–145)
Total Bilirubin: 1.4 mg/dL — ABNORMAL HIGH (ref 0.2–1.2)
Total Protein: 6.2 g/dL (ref 6.0–8.3)

## 2023-12-23 LAB — CBC WITH DIFFERENTIAL/PLATELET
Basophils Absolute: 0 K/uL (ref 0.0–0.1)
Basophils Relative: 0.5 % (ref 0.0–3.0)
Eosinophils Absolute: 0 K/uL (ref 0.0–0.7)
Eosinophils Relative: 0.3 % (ref 0.0–5.0)
HCT: 38.4 % — ABNORMAL LOW (ref 39.0–52.0)
Hemoglobin: 12.8 g/dL — ABNORMAL LOW (ref 13.0–17.0)
Lymphocytes Relative: 27.5 % (ref 12.0–46.0)
Lymphs Abs: 1.1 K/uL (ref 0.7–4.0)
MCHC: 33.3 g/dL (ref 30.0–36.0)
MCV: 84.3 fl (ref 78.0–100.0)
Monocytes Absolute: 0.3 K/uL (ref 0.1–1.0)
Monocytes Relative: 8.5 % (ref 3.0–12.0)
Neutro Abs: 2.5 K/uL (ref 1.4–7.7)
Neutrophils Relative %: 63.2 % (ref 43.0–77.0)
Platelets: 167 K/uL (ref 150.0–400.0)
RBC: 4.55 Mil/uL (ref 4.22–5.81)
RDW: 15.9 % — ABNORMAL HIGH (ref 11.5–15.5)
WBC: 3.9 K/uL — ABNORMAL LOW (ref 4.0–10.5)

## 2023-12-23 NOTE — Progress Notes (Signed)
 Patient ID: Nicholas Owen, male    DOB: Jan 27, 1946, 78 y.o.   MRN: [id number]  Chief Complaint  Patient presents with   Fatigue    Pt c/o Fatigue, Excessive thirst, fever of 101.7, nausea, reduced appetite and SOB. Present since 7/17. Has tried acetaminophen  q10h.   Discussed the use of AI scribe software for clinical note transcription with the patient, who gave verbal consent to proceed.  History of Present Illness Brendan Gadson is a 78 year old male with a history of pulmonary embolism who presents with his wife for fever and shallow breathing.  Respiratory symptoms - Shallow and labored breathing for approximately one week, comes and goes - No cough present - Symptoms began after exposure to a patient with pneumonia - Current respiratory symptoms are less severe than prior episodes of pulmonary embolism - No SOB today  Fever - Fever occurred primarily in the evenings - Fever lasted two days with a two-degree elevation - No fever during day - Fever has since subsided  Constitutional symptoms - General malaise for about one week - Denies viral symptoms, no cough - Decreased appetite without nausea - Feels stronger currently compared to earlier in the illness  Hydration and urinary changes - Hydrates ok especially when outdoors - Occasional darker urine - Reports being outdoors for several days in the afternoon for 2 hours at a time working in yard & helping a friend, sweating heavily, at the onset of sx  Anticoagulation therapy - 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 Lungs clear on exam, no SOB in office, O2 sat normal, no difficulty speaking. No signs of heat stroke. Reassured s/s not consistent with PE. - Encourage hydration with cool fluids, at least 2.5L per day.  - Advise use of cooling cloths and taking breaks from heat every 30 minutes. - Drink 12 oz of G2 (gatorade w/o sugar) if sweating - 1-2 bottles per day. - Order blood tests to check for underlying infection, electrolytes. - Call back if sx are persisting.  Borderline Diabetes Borderline diabetes with high intake of sweets and carbohydrates. Discussed reduction of sweets and switch intake to fresh whole fruit, 1-2 servings per day to replace artificial sweets. - Encourage reduction of sweets and white carbohydrates. - Advise increased intake of fresh fruits, particularly berries, apples, pears.   Subjective:    Outpatient Medications Prior to Visit  Medication Sig Dispense Refill   cholecalciferol (VITAMIN D3) 25 MCG (1000 UNIT) tablet Take 2,000 Units by mouth daily.     co-enzyme Q-10 50 MG capsule Take 200 mg by mouth daily.     Multiple Vitamins-Minerals (MULTIVITAMIN WITH MINERALS) tablet Take 1 tablet by mouth daily.     omeprazole  (PRILOSEC) 20 MG capsule TAKE 1 CAPSULE EVERY DAY 90 capsule 3   rosuvastatin  (CRESTOR ) 10 MG tablet TAKE 1 TABLET EVERY DAY 90 tablet 3   warfarin (COUMADIN ) 5 MG tablet TAKE 1 TABLET DAILY OR AS DIRECTED BY ANTICOAGULATION CLINIC 90 tablet 3   enoxaparin  (LOVENOX ) 100 MG/ML injection Inject 1 mL (100 mg total)  into the skin daily. USE AS DIRECTED BY ANTICOAGULATION CLINIC (Patient not taking: Reported on 12/23/2023) 8 mL 0   enoxaparin  (LOVENOX ) 80 MG/0.8ML injection Inject 0.8 mLs (80 mg total) into the skin daily. USE AS DIRECTED BY ANTICOAGULATION CLINIC (Patient not taking: Reported on 12/23/2023) 6.4 mL 0   sildenafil (VIAGRA) 100 MG tablet  (Patient not taking:  Reported on 12/23/2023)     No facility-administered medications prior to visit.   Past Medical History:  Diagnosis Date   Allergy    Asbestos exposure    Basal cell carcinoma    skin of left ear   BPH (benign prostatic hyperplasia)    Cancer (HCC)    basal cell CA (skin)   Clotting disorder (HCC) 2021   Colitis 01/2011   Colon polyp    Deviated nasal septum    hx. 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 Prostate cancer after bx.   Ulcerative colitis [facility])    Past Surgical History:  Procedure Laterality Date   APPENDECTOMY  2022   COLONOSCOPY     CYSTOSCOPY  02/16/2012   Procedure: CYSTOSCOPY FLEXIBLE;  Surgeon: Alm GORMAN Fragmin, MD;  Location: [facility];  Service: Urology;  Laterality: N/A;  FLEX CYSTO WITH REMOVAL OF STAPLES    INGUINAL HERNIA REPAIR  01/31/1989   double   KNEE SURGERY  06/02/2005   right knee scope   LAPAROSCOPIC APPENDECTOMY N/A 09/15/2019   Procedure: APPENDECTOMY LAPAROSCOPIC;  Surgeon: Tanda Locus, MD;  Location: [facility];  Service: General;  Laterality: N/A;   lipoma removals  01/31/1989   multiple and multiple remains now   LYMPH NODE DISSECTION  12/31/2011   Procedure: LYMPH NODE DISSECTION;  Surgeon: Alm GORMAN Fragmin, MD;  Location: [facility];  Service: Urology;  Laterality: Bilateral;  Bilateral   POLYPECTOMY     ROBOT ASSISTED LAPAROSCOPIC RADICAL PROSTATECTOMY  12/31/2011   Procedure: ROBOTIC ASSISTED LAPAROSCOPIC RADICAL PROSTATECTOMY;  Surgeon: Alm GORMAN Fragmin, MD;  Location: [facility];  Service: Urology;  Laterality: N/A;       TONSILLECTOMY     No Known Allergies    Objective:    Physical  Exam Vitals and nursing note reviewed.  Constitutional:      General: He is not in acute distress.    Appearance: Normal appearance. He is obese. He is not ill-appearing or toxic-appearing.  HENT:     Head: Normocephalic.  Cardiovascular:     Rate and Rhythm: Normal rate and regular rhythm.  Pulmonary:     Effort: Pulmonary effort is normal.     Breath sounds: Normal breath sounds.  Musculoskeletal:        General: Normal range of motion.     Cervical back: Normal range of motion.  Skin:    General: Skin is warm and dry.  Neurological:     Mental Status: He is alert and oriented to person, place, and time.  Psychiatric:        Mood and Affect: Mood normal.    BP 122/82 (BP Location: Left Arm, Patient Position: Sitting, Cuff Size:  Large)   Pulse 75   Temp 98.4 F (36.9 C) (Temporal)   Ht 6' (1.829 m)   Wt 261 lb 6.4 oz (118.6 kg)   SpO2 94%   BMI 35.45 kg/m  Wt Readings from Last 3 Encounters:  12/23/23 261 lb 6.4 oz (118.6 kg)  09/24/23 260 lb (117.9 kg)  08/18/23 260 lb 12.8 oz (118.3 kg)      Lucius Krabbe, NP

## 2023-12-24 ENCOUNTER — Ambulatory Visit: Payer: Self-pay | Admitting: Family

## 2023-12-25 ENCOUNTER — Ambulatory Visit

## 2023-12-25 DIAGNOSIS — Z7901 Long term (current) use of anticoagulants: Secondary | ICD-10-CM

## 2023-12-25 LAB — POCT INR: INR: 3.3 — AB (ref 2.0–3.0)

## 2023-12-25 NOTE — Patient Instructions (Addendum)
 Pre visit review using our clinic review tool, if applicable. No additional management support is needed unless otherwise documented below in the visit note.  Hold warfarin dose tomorrow and then continue 1 tablet daily except take 1/2 tablet on Sundays and Wednesdays. Recheck in 2 weeks.

## 2023-12-25 NOTE — Progress Notes (Signed)
 Pt reports he has not been feeling very well and had an OV 2 days ago. All labs came back normal. Fever has gone but appetite is still not good.  Pt already took warfarin today.  Hold warfarin dose tomorrow and then continue 1 tablet daily except take 1/2 tablet on Sundays and Wednesdays. Recheck in 2 weeks.

## 2023-12-28 ENCOUNTER — Telehealth: Payer: Self-pay | Admitting: Family Medicine

## 2023-12-28 DIAGNOSIS — D649 Anemia, unspecified: Secondary | ICD-10-CM

## 2023-12-28 DIAGNOSIS — R748 Abnormal levels of other serum enzymes: Secondary | ICD-10-CM

## 2023-12-28 DIAGNOSIS — R5081 Fever presenting with conditions classified elsewhere: Secondary | ICD-10-CM

## 2023-12-28 NOTE — Telephone Encounter (Signed)
 Pt is due for a 1 wk lab on 7/31 but no orders. Please update.

## 2023-12-29 ENCOUNTER — Ambulatory Visit: Payer: Self-pay

## 2023-12-29 ENCOUNTER — Telehealth: Payer: Self-pay | Admitting: *Deleted

## 2023-12-29 DIAGNOSIS — L958 Other vasculitis limited to the skin: Secondary | ICD-10-CM | POA: Diagnosis not present

## 2023-12-29 DIAGNOSIS — M310 Hypersensitivity angiitis: Secondary | ICD-10-CM | POA: Diagnosis not present

## 2023-12-29 NOTE — Telephone Encounter (Signed)
 Copied from CRM [id number]. Topic: Clinical - Lab/Test Results >> Dec 29, 2023  3:31 PM Jayma L wrote: Reason for CRM: patient and his wife Doyal called asking we fax the blood work from lasts visit to Dr Norleen Hurst at fax number [phone] or call the dr at phone number [phone]. She said this was needed asap  Spoke to pt and wife told them I have faxed labs over, but not sure if going thru will check and then call office. Fax number no answer.  Called Dr. Milford office spoke to [place] told her trying to send labs over on mutual pt, but it says no answer. Inocente checked fax did not receive she said to try again. Fax sent again and finally went through.

## 2023-12-29 NOTE — Telephone Encounter (Signed)
 FYI Only or Action Required?: Action required by provider: request for appointment and update on patient condition.  Patient was last seen in primary care on 12/23/2023 by Lucius Krabbe, NP.  Called Nurse Triage reporting Leg Swelling.  Symptoms began yesterday.  Interventions attempted: OTC medications: acetaminophen .  Symptoms are: gradually worsening.  Triage Disposition: See Physician Within 24 Hours  Patient/caregiver understands and will follow disposition?: Yes          Copied from CRM #[id number]. Topic: Clinical - Red Word Triage >> Dec 29, 2023  3:35 PM Jayma L wrote: Red Word that prompted transfer to Nurse Triage:  red bumps on legs they is worse and swelling on both legs and feet swollen Reason for Disposition  [1] MODERATE leg swelling (e.g., swelling extends up to knees) AND [2] new-onset or getting worse  Answer Assessment - Initial Assessment Questions 1. ONSET: When did the swelling start? (e.g., minutes, hours, days)     Yesterday 2. LOCATION: What part of the leg is swollen?  Are both legs swollen or just one leg?     both 3. SEVERITY: How bad is the swelling? (e.g., localized; mild, moderate, severe)     Mild-mod 4. REDNESS: Is there redness or signs of infection?     Yes redness  5. PAIN: Is the swelling painful to touch? If Yes, ask: How painful is it?   (Scale 1-10; mild, moderate or severe)     Painful to touch, 6/10 6. FEVER: Do you have a fever? If Yes, ask: What is it, how was it measured, and when did it start?      no 7. CAUSE: What do you think is causing the leg swelling?     unknown 8. MEDICAL HISTORY: Do you have a history of blood clots (e.g., DVT), cancer, heart failure, kidney disease, or liver failure?     Yes, hx of blood clots  9. RECURRENT SYMPTOM: Have you had leg swelling before? If Yes, ask: When was the last time? What happened that time?     no 10. OTHER SYMPTOMS: Do you have any other  symptoms? (e.g., chest pain, difficulty breathing)       Red rash  Protocols used: Leg Swelling and Edema-A-AH

## 2023-12-29 NOTE — Telephone Encounter (Deleted)
 FYI Only or Action Required?: {FYI only or action required:32865}  Patient was last seen in primary care on 12/23/2023 by Lucius Krabbe, NP.  Called Nurse Triage reporting Leg Swelling.  Symptoms began {$Time; disease onset (duration):32870}.  Interventions attempted: {Primary Care interventions tried:32867}.  Symptoms are: {COURSE IMPROVING/WORSENING SX HPI:23693}.  Triage Disposition: See Physician Within 24 Hours  Patient/caregiver understands and will follow disposition?: Yes

## 2023-12-29 NOTE — Telephone Encounter (Signed)
 Noted

## 2023-12-29 NOTE — Telephone Encounter (Signed)
 Pt scheduled for appt tomorrow but really didn't want to see a NP or PA,convinced pt that he needs to be seen tomorrow and that any provider can address his legs.

## 2023-12-30 ENCOUNTER — Telehealth: Payer: Self-pay

## 2023-12-30 ENCOUNTER — Ambulatory Visit (INDEPENDENT_AMBULATORY_CARE_PROVIDER_SITE_OTHER): Admitting: Family

## 2023-12-30 VITALS — BP 125/82 | HR 78 | Temp 99.0°F | Ht 72.0 in | Wt 261.5 lb

## 2023-12-30 DIAGNOSIS — D649 Anemia, unspecified: Secondary | ICD-10-CM

## 2023-12-30 DIAGNOSIS — R600 Localized edema: Secondary | ICD-10-CM | POA: Diagnosis not present

## 2023-12-30 DIAGNOSIS — R748 Abnormal levels of other serum enzymes: Secondary | ICD-10-CM | POA: Diagnosis not present

## 2023-12-30 DIAGNOSIS — R21 Rash and other nonspecific skin eruption: Secondary | ICD-10-CM

## 2023-12-30 LAB — COMPREHENSIVE METABOLIC PANEL WITH GFR
ALT: 36 U/L (ref 0–53)
AST: 22 U/L (ref 0–37)
Albumin: 3.7 g/dL (ref 3.5–5.2)
Alkaline Phosphatase: 67 U/L (ref 39–117)
BUN: 11 mg/dL (ref 6–23)
CO2: 27 meq/L (ref 19–32)
Calcium: 9 mg/dL (ref 8.4–10.5)
Chloride: 103 meq/L (ref 96–112)
Creatinine, Ser: 0.8 mg/dL (ref 0.40–1.50)
GFR: 85.97 mL/min (ref >60.00)
Glucose, Bld: 102 mg/dL — ABNORMAL HIGH (ref 70–99)
Potassium: 3.9 meq/L (ref 3.5–5.1)
Sodium: 138 meq/L (ref 135–145)
Total Bilirubin: 1.1 mg/dL (ref 0.2–1.2)
Total Protein: 6.4 g/dL (ref 6.0–8.3)

## 2023-12-30 LAB — CBC WITH DIFFERENTIAL/PLATELET
Basophils Absolute: 0 K/uL (ref 0.0–0.1)
Basophils Relative: 0.5 % (ref 0.0–3.0)
Eosinophils Absolute: 0 K/uL (ref 0.0–0.7)
Eosinophils Relative: 1.9 % (ref 0.0–5.0)
HCT: 37 % — ABNORMAL LOW (ref 39.0–52.0)
Hemoglobin: 12.4 g/dL — ABNORMAL LOW (ref 13.0–17.0)
Lymphocytes Relative: 61 % — ABNORMAL HIGH (ref 12.0–46.0)
Lymphs Abs: 1.4 K/uL (ref 0.7–4.0)
MCHC: 33.5 g/dL (ref 30.0–36.0)
MCV: 82.9 fl (ref 78.0–100.0)
Monocytes Absolute: 0.1 K/uL (ref 0.1–1.0)
Monocytes Relative: 5.8 % (ref 3.0–12.0)
Neutro Abs: 0.7 K/uL — ABNORMAL LOW (ref 1.4–7.7)
Neutrophils Relative %: 30.8 % — ABNORMAL LOW (ref 43.0–77.0)
Platelets: 408 K/uL — ABNORMAL HIGH (ref 150.0–400.0)
RBC: 4.46 Mil/uL (ref 4.22–5.81)
RDW: 16.1 % — ABNORMAL HIGH (ref 11.5–15.5)
WBC: 2.3 K/uL — ABNORMAL LOW (ref 4.0–10.5)

## 2023-12-30 MED ORDER — DOXYCYCLINE HYCLATE 100 MG PO TABS: 100.0000 mg | ORAL_TABLET | Freq: Two times a day (BID) | ORAL | 0 refills | Status: AC

## 2023-12-30 MED ORDER — PREDNISONE 20 MG PO TABS: 20.0000 mg | ORAL_TABLET | Freq: Every day | ORAL | 0 refills | Status: DC

## 2023-12-30 NOTE — Telephone Encounter (Signed)
 Noted

## 2023-12-30 NOTE — Telephone Encounter (Signed)
 Pt had apt with Corean Comment, NP, today. Partial note below concerning visit today,  Rapidly spreading lower extremity rash - Onset Sunday night with a small patch on the leg - Rapid expansion by Monday, involving a larger area on the side of the leg and spreading to the contralateral leg - Rash extends up high on both thighs but does not reach the groin - Tenderness to touch, especially around the ankle area - Associated swelling in both feet - Painful ambulation due to swelling and tenderness - No pruritus - No prior similar symptoms  Pt was prescribed azithromycin  by dermatology but has only take one tablet. Pt was advised by Corean today to stop azithromycin  and start doxycycline  x 7 days and prednisone , 20 mg x 7 days.  He was advised to have INR checked this week. Made apt for INR check for 8/1. Advised these medications may increase INR and to watch for s/s of abnormal bruising or bleeding. Advised if any s/s to go to ER. Pt and his wife verbalized understanding.

## 2023-12-30 NOTE — Addendum Note (Signed)
 Addended by: NEYSA CLARIA SAILOR on: 12/30/2023 11:42 AM   Modules accepted: Orders

## 2023-12-30 NOTE — Telephone Encounter (Signed)
 CBC w/ diff and CMET orders placed for future. Please advise if any other orders need to be placed.

## 2023-12-30 NOTE — Progress Notes (Signed)
 Patient ID: Nicholas Owen, male    DOB: 1945-12-25, 78 y.o.   MRN: [id number]  Chief Complaint  Patient presents with   Leg Pain    Pt c/o leg pain and swelling with red bumps for 2 weeks.  Discussed the use of AI scribe software for clinical note transcription with the patient, who gave verbal consent to proceed.  History of Present Illness Nicholas Owen is a 78 year old male who presents with a rapidly spreading rash on his legs.  Rapidly spreading lower extremity rash - Onset Sunday night with a small patch on the leg - Rapid expansion by Monday, involving a larger area on the side of the leg and spreading to the contralateral leg - Rash extends up high on both thighs but does not reach the groin - Tenderness to touch, especially around the ankle area - Associated swelling in both feet - Painful ambulation due to swelling and tenderness - No pruritus - No prior similar symptoms  Medication use and anticoagulation status - Currently taking azithromycin  250 mg once daily, started yesterday - Taking an iron supplement for mild anemia - On warfarin for approximately two years - Recent INR slightly elevated at 3.3 - Scheduled for follow-up INR check next week  Environmental and exposure history - Lives primarily indoors in a retirement community with reduced outdoor activities since retirement - Trimmed crepe myrtles last week, an activity performed previously without reaction - No known allergies  Assessment & Plan Bilateral lower extremity rash with swelling and tenderness Rapidly developing rash on lower extremities from thighs down to ankles with swelling noted mainly in lower legs, ankles & feet, right > left, also tenderness and ulcer appearing lesions, worse on right lateral leg. 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 Pictures below and in chart. Concerns about azithromycin 's efficacy and warfarin interaction. - Stop azithromycin  and start Doxycycline  100mg   bid x7d. - Start low dose prednisone  20mg  qam x7d. - Elevate legs to reduce swelling as much as possible during day when resting. - Monitor biopsy results via DERM and adjust treatment accordingly. - Follow up with dermatologist by Friday at noon. - Call warfarin nurse, Clotilda, and as for earlier recheck on Friday or Monday at latest due to adding above meds and need for closer monitoring.  Anemia Slight anemia noted last week. Started iron supplement on his own after hearing lab result, though not ordered by provider. Rash onset post-iron initiation. - Order repeat blood count today. - Discontinue iron supplement to assess rash contribution.   Subjective:    Outpatient Medications Prior to Visit  Medication Sig Dispense Refill   cholecalciferol (VITAMIN D3) 25 MCG (1000 UNIT) tablet Take 2,000 Units by mouth daily.     co-enzyme Q-10 50 MG capsule Take 200 mg by mouth daily.     Multiple Vitamins-Minerals (MULTIVITAMIN WITH MINERALS) tablet Take 1 tablet by mouth daily.     omeprazole  (PRILOSEC) 20 MG capsule TAKE 1 CAPSULE EVERY DAY 90 capsule 3   OVER THE COUNTER MEDICATION Iron     rosuvastatin  (CRESTOR ) 10 MG tablet TAKE 1 TABLET EVERY DAY 90 tablet 3   warfarin (COUMADIN ) 5 MG tablet TAKE 1 TABLET DAILY OR AS DIRECTED BY ANTICOAGULATION CLINIC 90 tablet 3   enoxaparin  (LOVENOX ) 100 MG/ML injection Inject 1 mL (100 mg total) into the skin daily. USE AS DIRECTED BY ANTICOAGULATION CLINIC (Patient not taking: Reported on 12/23/2023) 8 mL 0   enoxaparin  (LOVENOX ) 80 MG/0.8ML injection Inject 0.8 mLs (  80 mg total) into the skin daily. USE AS DIRECTED BY ANTICOAGULATION CLINIC (Patient not taking: Reported on 12/23/2023) 6.4 mL 0   sildenafil (VIAGRA) 100 MG tablet  (Patient not taking: Reported on 12/23/2023)     No facility-administered medications prior to visit.   Past Medical History:  Diagnosis Date   Allergy    Arthralgia of left knee 06/15/2020   Asbestos exposure    Basal  cell carcinoma    skin of left ear   BPH (benign prostatic hyperplasia)    Cancer (HCC)    basal cell CA (skin)   Carcinoma of prostate (HCC) 12/23/2023   Total Prostatectomy Dr. Alline 2013. PSA testing through Dr. Alline; still with residual disease     Chronic nonseasonal allergic rhinitis due to pollen 12/23/2023   Clotting disorder (HCC) 2021   Colitis 01/2011   Colon polyp    Deviated nasal septum    hx. 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 Prostate cancer after bx.   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 He does not want to consider surgical intervention for now. Continue to use pads.     Past Surgical History:  Procedure Laterality Date   APPENDECTOMY  2022   COLONOSCOPY     CYSTOSCOPY  02/16/2012   Procedure: CYSTOSCOPY FLEXIBLE;  Surgeon: Alm GORMAN Alline, MD;  Location[facility];  Service: Urology;  Laterality: N/A;  FLEX CYSTO WITH REMOVAL OF STAPLES    INGUINAL HERNIA REPAIR  01/31/1989   double   KNEE SURGERY  06/02/2005   right knee scope   LAPAROSCOPIC APPENDECTOMY N/A 09/15/2019   Procedure: APPENDECTOMY LAPAROSCOPIC;  Surgeon: Tanda Locus, MD;  Location: [facility];  Service: General;  Laterality: N/A;    lipoma removals  01/31/1989   multiple and multiple remains now   LYMPH NODE DISSECTION  12/31/2011   Procedure: LYMPH NODE DISSECTION;  Surgeon: Alm GORMAN Alline, MD;  Location: [facility];  Service: Urology;  Laterality: Bilateral;  Bilateral   POLYPECTOMY     ROBOT ASSISTED LAPAROSCOPIC RADICAL PROSTATECTOMY  12/31/2011   Procedure: ROBOTIC ASSISTED LAPAROSCOPIC RADICAL PROSTATECTOMY;  Surgeon: Alm GORMAN Alline, MD;  Location: [facility];  Service: Urology;  Laterality: N/A;       TONSILLECTOMY     No Known Allergies    Objective:    Physical Exam Vitals and nursing note reviewed.  Constitutional:      General: He is not in acute distress.    Appearance: Normal appearance.  HENT:     Head: Normocephalic.  Cardiovascular:     Rate and Rhythm: Normal rate and regular rhythm.  Pulmonary:     Effort: Pulmonary effort is normal.     Breath  sounds: Normal breath sounds.  Musculoskeletal:        General: Normal range of motion.     Cervical back: Normal range of motion.     Right lower leg: 3+ Edema present.     Left lower leg: 2+ Edema present.  Skin:    General: Skin is warm and dry.     Findings: Rash (raised, red/purplish colored circular bumps varying in size on lower extremities from thighs down to ankles with swelling noted mainly in lower legs, ankles & feet, right > left, also tenderness and ulcer appearing lesions, worse on right lateral leg) present. Rash is purpuric.  Neurological:     Mental Status: He is alert and oriented to person, place, and time.  Psychiatric:        Mood and Affect: Mood normal.       BP 125/82 (BP Location: Left Arm, Patient Position: Sitting, Cuff Size: Large)   Pulse 78   Temp 99 F (37.2 C) (Temporal)   Ht 6' (1.829 m)   Wt 261 lb 8 oz (118.6 kg)   SpO2 96%   BMI 35.47 kg/m  Wt Readings from Last 3 Encounters:  12/30/23 261 lb 8 oz (118.6 kg)  12/23/23 261 lb 6.4 oz (118.6 kg)  09/24/23 260 lb (117.9 kg)      Lucius Krabbe,  NP

## 2023-12-31 ENCOUNTER — Other Ambulatory Visit

## 2024-01-01 ENCOUNTER — Ambulatory Visit: Payer: Self-pay | Admitting: Family

## 2024-01-01 ENCOUNTER — Telehealth: Payer: Self-pay | Admitting: *Deleted

## 2024-01-01 ENCOUNTER — Ambulatory Visit

## 2024-01-01 DIAGNOSIS — Z7901 Long term (current) use of anticoagulants: Secondary | ICD-10-CM | POA: Diagnosis not present

## 2024-01-01 LAB — POCT INR: INR: 2.6 (ref 2.0–3.0)

## 2024-01-01 NOTE — Progress Notes (Signed)
 Please review & advise on my last ov note and pics. Believe it is vasculitis? CBC a mess - combo of warfarin, abt, & vasculitis? He saw derm who did biopsy  & I asked for more frequent INR checks - switched abt to doxy and added low dose prednisone .

## 2024-01-01 NOTE — Telephone Encounter (Signed)
 Copied from CRM [id number]. Topic: Clinical - Lab/Test Results >> Jan 01, 2024 11:02 AM Lavanda D wrote: Reason for CRM: Patient's wife is calling to go over lab results from 7/30. Would like them to be faxed to Dr. Norleen Shilling fax#: [phone]. Dr. Shona would like to hear back by noon if possible.   Patent notified results faxed to [phone] Per Stephanie's note Dr Katrinka will reviewed labs  Your CMA will call back  with more information  Adaia Matthies,RMA

## 2024-01-01 NOTE — Progress Notes (Cosign Needed Addendum)
 Pt had bilateral lower leg rash start on 7/27 and had visit with dermatology. They placed pt on an azithromycin . Pt saws Corean Comment, NP, on 7/30 and was prescribed doxycycline  x 7 days and prednisone , 20 mg x 7 days. Azithromycin  was stopped by Corean. This will cause increase in INR.  Continue 1 tablet daily except take 1/2 tablet on Sundays and Wednesdays. Recheck in 1 weeks.   Advised pt a mychart msg would be sent so he can respond directly back to the coumadin  clinic.   Medical screening examination/treatment/procedure(s) were performed by non-physician practitioner and as supervising physician I was immediately available for consultation/collaboration.  I agree with above. Karlynn Noel, MD

## 2024-01-01 NOTE — Patient Instructions (Addendum)
 Pre visit review using our clinic review tool, if applicable. No additional management support is needed unless otherwise documented below in the visit note.  Continue 1 tablet daily except take 1/2 tablet on Sundays and Wednesdays. Recheck in 1 weeks.

## 2024-01-04 ENCOUNTER — Other Ambulatory Visit: Payer: Self-pay

## 2024-01-04 ENCOUNTER — Other Ambulatory Visit (INDEPENDENT_AMBULATORY_CARE_PROVIDER_SITE_OTHER)

## 2024-01-04 ENCOUNTER — Telehealth: Payer: Self-pay | Admitting: Family Medicine

## 2024-01-04 ENCOUNTER — Ambulatory Visit: Payer: Self-pay | Admitting: Family Medicine

## 2024-01-04 DIAGNOSIS — D649 Anemia, unspecified: Secondary | ICD-10-CM

## 2024-01-04 LAB — CBC WITH DIFFERENTIAL/PLATELET
Basophils Absolute: 0 K/uL (ref 0.0–0.1)
Basophils Relative: 0.7 % (ref 0.0–3.0)
Eosinophils Absolute: 0 K/uL (ref 0.0–0.7)
Eosinophils Relative: 1.1 % (ref 0.0–5.0)
HCT: 36.9 % — ABNORMAL LOW (ref 39.0–52.0)
Hemoglobin: 12.5 g/dL — ABNORMAL LOW (ref 13.0–17.0)
Lymphocytes Relative: 77.3 % — ABNORMAL HIGH (ref 12.0–46.0)
Lymphs Abs: 0.8 K/uL (ref 0.7–4.0)
MCHC: 33.9 g/dL (ref 30.0–36.0)
MCV: 82.5 fl (ref 78.0–100.0)
Monocytes Absolute: 0.2 K/uL (ref 0.1–1.0)
Monocytes Relative: 17.3 % — ABNORMAL HIGH (ref 3.0–12.0)
Neutro Abs: 0 K/uL — ABNORMAL LOW (ref 1.4–7.7)
Neutrophils Relative %: 3.6 % — ABNORMAL LOW (ref 43.0–77.0)
Platelets: 419 K/uL — ABNORMAL HIGH (ref 150.0–400.0)
RBC: 4.48 Mil/uL (ref 4.22–5.81)
RDW: 15.4 % (ref 11.5–15.5)
WBC: 1 K/uL — CL (ref 4.0–10.5)

## 2024-01-04 NOTE — Telephone Encounter (Signed)
 Infection fighting cells have really plummeted.  Anemia is thankfully largely stable.  Neutrophils which are bacterial infection fighting cells have gone to 0 which is pretty irregular-team please place referral to hematology under neutropenia as stat.  I suspect this is related to the vasculitis but want to be cautious.  If he begins to feel poorly would go straight to the hospital with these low levels

## 2024-01-04 NOTE — Telephone Encounter (Signed)
 CRITICAL VALUE STICKER  CRITICAL VALUE: WBC 1.0 and Lymph 78  RECEIVER (on-site recipient of call): Waddell Salles  DATE & TIME NOTIFIED: 01/04/2024 4:32pm  MESSENGER (representative from lab): Carlos at [facility] lab   MD NOTIFIED: Katrinka  TIME OF NOTIFICATION: 4:40pm  RESPONSE: He will review

## 2024-01-05 ENCOUNTER — Ambulatory Visit

## 2024-01-05 ENCOUNTER — Other Ambulatory Visit: Payer: Self-pay

## 2024-01-05 DIAGNOSIS — D709 Neutropenia, unspecified: Secondary | ICD-10-CM

## 2024-01-05 NOTE — Telephone Encounter (Signed)
 Referral has been placed.

## 2024-01-06 ENCOUNTER — Ambulatory Visit

## 2024-01-06 ENCOUNTER — Ambulatory Visit: Payer: Self-pay

## 2024-01-06 ENCOUNTER — Telehealth: Payer: Self-pay | Admitting: Family Medicine

## 2024-01-06 ENCOUNTER — Telehealth: Payer: Self-pay | Admitting: Hematology and Oncology

## 2024-01-06 ENCOUNTER — Ambulatory Visit
Admission: RE | Admit: 2024-01-06 | Discharge: 2024-01-06 | Disposition: A | Discharge status: Home / Self Care | Source: Ambulatory Visit | Attending: Family Medicine | Admitting: Family Medicine

## 2024-01-06 DIAGNOSIS — I776 Arteritis, unspecified: Secondary | ICD-10-CM

## 2024-01-06 DIAGNOSIS — R509 Fever, unspecified: Secondary | ICD-10-CM

## 2024-01-06 DIAGNOSIS — L958 Other vasculitis limited to the skin: Secondary | ICD-10-CM | POA: Diagnosis not present

## 2024-01-06 NOTE — Telephone Encounter (Signed)
 Doyal called in to schedule Nicholas Owen for an urgent appointment. I placed Nicholas Owen in the next available slot on Dr. Walter schedule.

## 2024-01-06 NOTE — Addendum Note (Signed)
 Addended by: KATRINKA GARNETTE KIDD on: 01/06/2024 02:22 PM   Modules accepted: Orders

## 2024-01-06 NOTE — Telephone Encounter (Signed)
 Spoke with patient and apologize that I do not have afternoon availability today as I am on administrative half day do not have coverage or schedule. He feels well. No fever in over a week. He has opted not to go to ER. He was able to get in contact with oncology for stat referral and is going to be seen tomorrow!   Has worked with Dr. 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 Also saw Dr. Shona again today.  He commended Panorex to look for infection as well as chest x-ray-I have ordered these at [facility] as we do not have Panorex available here  Already had rash when changed from azithromycin  to doxycycline -does not appear to be drug mediated. Originally Dr. Shona placed him on azithromycin  for the rash- did this to be cautious with the warfarin.  He did feel ill initially and it could possibly related to the vasculitis including the fever that he initially had  Reports mild PSA elevation possible recurrence of prostate cancer and we talked about risk of malignancy being associated with this as well as autoimmune disease  Since he is already seeing oncology tomorrow we opted to hold off on labs but I asked him to have extra tubes drawn as they may do more extensive blood work-I am also happy to help with this in the future if needed

## 2024-01-06 NOTE — Telephone Encounter (Signed)
 FYI Only or Action Required?: Action required by provider: clinical question for provider, update on patient condition, lab or test result follow-up needed, and Request for xray.  Patient was last seen in primary care on 12/30/2023 by Lucius Krabbe, NP.  Called Nurse Triage reporting lab results and infection.  Symptoms began na.  Interventions attempted: Prescription medications: antibiotic, prednisone .  Symptoms are: no symptoms at this time.  Triage Disposition: Home Care  Patient/caregiver understands and will follow disposition?: No, wishes to speak with PCP  Reason for Disposition  [1] Taking antibiotic AND [2] symptoms are BETTER AND [3] no fever  Answer Assessment - Initial Assessment Questions 1. INFECTION: What infection is the antibiotic being given for?     Vasculitis  2. ANTIBIOTIC: What antibiotic are you taking How many times per day?     Completed 3. DURATION: When was the antibiotic started?     *No Answer* 4. MAIN CONCERN OR SYMPTOM:  What is your main concern right now?     No symptoms at this time 5. BETTER-SAME-WORSE: Are you getting better, staying the same, or getting worse compared to when you first started the antibiotics? If getting worse, ask: In what way?       6. FEVER: Do you have a fever? If Yes, ask: What is your temperature, how was it measured, and when did it start?     No fever X 7 days 7. SYMPTOMS: Are there any other symptoms you're concerned about? If Yes, ask: When did it start?     Denies  8. FOLLOW-UP APPOINTMENT: Do you have a follow-up appointment with your doctor?     Referred to oncology  Additional Info: 1) Received caller from [facility] to triage for abnormal lab with concern for need of ER evaluation.  2) Family is upset that they were not called regarding these abnormal results. Feels very impersonal to receive MyChart message, I'm old and I am not comfortable with MyChart, I would like for my doctor to  call me 3) Advised referral was sent to hem/onc and referral states 3-5 days for processing, phone number to clinic provided.  4) They are requesting a chest xray and pano X ray to seek source of infection.  They would like to schedule xray at [facility]. Please advise.  Protocols used: Infection on Antibiotic Follow-up Call-[facility]

## 2024-01-06 NOTE — Telephone Encounter (Signed)
 Called and spoke with patient and wife after speaking with Dr. Katrinka regarding patient not feeling well and low WBC count. Patient is agreeable to going to the ED today. Pt verbalized understanding and no further questions at this time.

## 2024-01-06 NOTE — Telephone Encounter (Signed)
 See previous encounter for documentation

## 2024-01-07 ENCOUNTER — Inpatient Hospital Stay: Attending: Hematology and Oncology | Admitting: Hematology and Oncology

## 2024-01-07 ENCOUNTER — Inpatient Hospital Stay

## 2024-01-07 ENCOUNTER — Ambulatory Visit: Payer: Self-pay | Admitting: Family Medicine

## 2024-01-07 ENCOUNTER — Other Ambulatory Visit: Payer: Self-pay | Admitting: *Deleted

## 2024-01-07 DIAGNOSIS — R21 Rash and other nonspecific skin eruption: Secondary | ICD-10-CM | POA: Diagnosis not present

## 2024-01-07 DIAGNOSIS — Z79899 Other long term (current) drug therapy: Secondary | ICD-10-CM | POA: Insufficient documentation

## 2024-01-07 DIAGNOSIS — Z800 Family history of malignant neoplasm of digestive organs: Secondary | ICD-10-CM | POA: Diagnosis not present

## 2024-01-07 DIAGNOSIS — R600 Localized edema: Secondary | ICD-10-CM

## 2024-01-07 DIAGNOSIS — K7689 Other specified diseases of liver: Secondary | ICD-10-CM | POA: Insufficient documentation

## 2024-01-07 DIAGNOSIS — E538 Deficiency of other specified B group vitamins: Secondary | ICD-10-CM | POA: Diagnosis not present

## 2024-01-07 DIAGNOSIS — E785 Hyperlipidemia, unspecified: Secondary | ICD-10-CM | POA: Insufficient documentation

## 2024-01-07 DIAGNOSIS — Z8546 Personal history of malignant neoplasm of prostate: Secondary | ICD-10-CM | POA: Insufficient documentation

## 2024-01-07 DIAGNOSIS — Z7901 Long term (current) use of anticoagulants: Secondary | ICD-10-CM | POA: Diagnosis not present

## 2024-01-07 DIAGNOSIS — I776 Arteritis, unspecified: Secondary | ICD-10-CM | POA: Diagnosis not present

## 2024-01-07 DIAGNOSIS — Z86718 Personal history of other venous thrombosis and embolism: Secondary | ICD-10-CM | POA: Diagnosis not present

## 2024-01-07 DIAGNOSIS — D709 Neutropenia, unspecified: Secondary | ICD-10-CM | POA: Diagnosis not present

## 2024-01-07 DIAGNOSIS — Z7952 Long term (current) use of systemic steroids: Secondary | ICD-10-CM | POA: Diagnosis not present

## 2024-01-07 LAB — CMP (CANCER CENTER ONLY)
ALT: 31 U/L (ref 0–44)
AST: 17 U/L (ref 15–41)
Albumin: 3.9 g/dL (ref 3.5–5.0)
Alkaline Phosphatase: 59 U/L (ref 38–126)
Anion gap: 5 (ref 5–15)
BUN: 16 mg/dL (ref 8–23)
CO2: 30 mmol/L (ref 22–32)
Calcium: 9.1 mg/dL (ref 8.9–10.3)
Chloride: 104 mmol/L (ref 98–111)
Creatinine: 0.89 mg/dL (ref 0.61–1.24)
GFR, Estimated: >60 mL/min (ref >60)
Glucose, Bld: 97 mg/dL (ref 70–99)
Potassium: 3.9 mmol/L (ref 3.5–5.1)
Sodium: 139 mmol/L (ref 135–145)
Total Bilirubin: 0.9 mg/dL (ref 0.0–1.2)
Total Protein: 6.4 g/dL — ABNORMAL LOW (ref 6.5–8.1)

## 2024-01-07 LAB — IRON AND IRON BINDING CAPACITY (CC-WL,HP ONLY)
Iron: 74 ug/dL (ref 45–182)
Saturation Ratios: 21 % (ref 17.9–39.5)
TIBC: 346 ug/dL (ref 250–450)
UIBC: 272 ug/dL (ref 117–376)

## 2024-01-07 LAB — CBC WITH DIFFERENTIAL/PLATELET
Abs Immature Granulocytes: 0.01 K/uL (ref 0.00–0.07)
Basophils Absolute: 0 K/uL (ref 0.0–0.1)
Basophils Relative: 1 %
Eosinophils Absolute: 0 K/uL (ref 0.0–0.5)
Eosinophils Relative: 1 %
HCT: 39.4 % (ref 39.0–52.0)
Hemoglobin: 13.5 g/dL (ref 13.0–17.0)
Immature Granulocytes: 0 %
Lymphocytes Relative: 62 %
Lymphs Abs: 1.4 K/uL (ref 0.7–4.0)
MCH: 28.5 pg (ref 26.0–34.0)
MCHC: 34.3 g/dL (ref 30.0–36.0)
MCV: 83.1 fL (ref 80.0–100.0)
Monocytes Absolute: 0.8 K/uL (ref 0.1–1.0)
Monocytes Relative: 32 %
Neutro Abs: 0.1 K/uL — CL (ref 1.7–7.7)
Neutrophils Relative %: 4 %
Platelets: 313 K/uL (ref 150–400)
RBC: 4.74 MIL/uL (ref 4.22–5.81)
RDW: 15 % (ref 11.5–15.5)
WBC: 2.3 K/uL — ABNORMAL LOW (ref 4.0–10.5)
nRBC: 0 % (ref 0.0–0.2)

## 2024-01-07 LAB — VITAMIN B12: Vitamin B-12: 231 pg/mL (ref 180–914)

## 2024-01-07 LAB — C-REACTIVE PROTEIN: CRP: 0.5 mg/dL (ref <1.0)

## 2024-01-07 LAB — TSH: TSH: 1.93 u[IU]/mL (ref 0.350–4.500)

## 2024-01-07 LAB — HEPATITIS PANEL, ACUTE
HCV Ab: NONREACTIVE
Hep A IgM: NONREACTIVE
Hep B C IgM: NONREACTIVE
Hepatitis B Surface Ag: NONREACTIVE

## 2024-01-07 MED ORDER — PREDNISONE 20 MG PO TABS: 20.0000 mg | ORAL_TABLET | Freq: Every day | ORAL | 0 refills | Status: DC

## 2024-01-07 MED ORDER — DOXYCYCLINE HYCLATE 100 MG PO TABS: 100.0000 mg | ORAL_TABLET | Freq: Two times a day (BID) | ORAL | 0 refills | Status: DC

## 2024-01-07 NOTE — Progress Notes (Signed)
 [facility] CONSULT NOTE  Patient Care Team: Katrinka Garnette KIDD, MD as PCP - General (Family Medicine) Cam Morene ORN, MD as Consulting Physician (Urology) Nicholaus Sherlean CROME, [facility] (Inactive) as Pharmacist (Pharmacist)  CHIEF COMPLAINTS/PURPOSE OF CONSULTATION:  Severe leukopenia.  ASSESSMENT & PLAN:   Assessment and Plan Assessment & Plan Severe neutropenia Severe neutropenia with WBC count near zero, increasing infection risk. Differential includes antibiotic-induced agranulocytosis, autoimmune vasculitis, or infection.  - Order comprehensive blood work to assess WBC count and other parameters. - Seek immediate medical attention at first sign of fever. - Consult infectious disease specialist. - Consider bone marrow biopsy if WBC count does not improve.  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 Dermatology suspects underlying infection; dental and chest x-ray unremarkable. Autoimmune etiology or atypical infection considered. - Evaluate need for prednisone  based on blood work results and potential vasculitis flare-up.  Addendum: WBC count improving, ANC improving still low. 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 He will hold all medications Discussed with his wife and him.   HISTORY OF PRESENTING ILLNESS:  Discussed the use of AI scribe software for clinical note transcription with the patient, who gave verbal consent to proceed.  History of Present Illness Nicholas Owen is a 78 year old male who presents with leukopenia and a recent outbreak of vasculitis.  He has experienced a significant drop in his white blood cell count, noted to be almost zero. This change was first observed on July 23rd, with a slight drop in his white blood cell count, whereas his blood counts were normal six months ago. He has no history  of blood transfusions or known risk factors for hepatitis B or C.  In July, he experienced a severe outbreak of vasculitis, described as a 'terrible outbreak' that began on his legs and spread to the other leg. Initially treated by a dermatologist, a double biopsy was performed, and he was prescribed a Z-Pak, which was quickly changed to doxycycline  and prednisone  by a nurse practitioner. He completed a seven-day course of these medications, finishing them yesterday. 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  He had a fever approximately three and a half weeks ago, with temperatures reaching 101.24F, primarily at night, lasting for four to five days. This was followed by the onset of a purpuric rash, which began on his thigh and spread rapidly within 24 hours. The rash was initially bright red and has since changed to a more purple color. He also notes swelling in the right foot and ankle, but no pain or swelling in the left leg.  He recalls pulling a live tick off himself about two months ago, but it was not attached. No recent travel, weight loss, joint pain, or weakness. He was active at the gym until the onset of his symptoms. He experiences knee pain due to previous cartilage removal, which limits his ability to walk upstairs.  He underwent a chest x-ray and a Panorex of his mouth, both of which were reported as clean. He is currently on warfarin and is cautious about potential interactions with other medications. No recent low-grade fevers.  All other systems were reviewed with the patient and are negative.  MEDICAL HISTORY:  Past Medical History:  Diagnosis Date   Allergy    Arthralgia of left knee 06/15/2020   Asbestos exposure    Basal cell carcinoma    skin of left  ear   BPH (benign prostatic hyperplasia)    Cancer (HCC)    basal cell CA (skin)   Carcinoma of prostate (HCC) 12/23/2023   Total Prostatectomy Dr. Alline 2013. PSA testing through Dr. Alline; still  with residual disease     Chronic nonseasonal allergic rhinitis due to pollen 12/23/2023   Clotting disorder (HCC) 2021   Colitis 01/2011   Colon polyp    Deviated nasal septum    hx. 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 Prostate cancer after bx.   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 He does not want to consider surgical intervention for now. Continue to use pads.      SURGICAL HISTORY: Past Surgical History:  Procedure Laterality Date   APPENDECTOMY  2022   COLONOSCOPY     CYSTOSCOPY  02/16/2012   Procedure: CYSTOSCOPY FLEXIBLE;  Surgeon: Alm GORMAN Alline, MD;  Location: [facility];  Service: Urology;  Laterality: N/A;  FLEX CYSTO WITH REMOVAL OF STAPLES    INGUINAL HERNIA REPAIR  01/31/1989   double   KNEE SURGERY  06/02/2005   right knee scope   LAPAROSCOPIC APPENDECTOMY N/A 09/15/2019   Procedure: APPENDECTOMY LAPAROSCOPIC;  Surgeon: Tanda Locus, MD;  Location: [facility];  Service: General;  Laterality: N/A;   lipoma removals  01/31/1989   multiple and multiple remains now   LYMPH NODE DISSECTION  12/31/2011   Procedure: LYMPH NODE DISSECTION;  Surgeon: Alm GORMAN Alline, MD;  Location: [facility];  Service: Urology;  Laterality:  Bilateral;  Bilateral   POLYPECTOMY     ROBOT ASSISTED LAPAROSCOPIC RADICAL PROSTATECTOMY  12/31/2011   Procedure: ROBOTIC ASSISTED LAPAROSCOPIC RADICAL PROSTATECTOMY;  Surgeon: Alm GORMAN Alline, MD;  Location: [facility];  Service: Urology;  Laterality: N/A;       TONSILLECTOMY      SOCIAL HISTORY: Social History   Socioeconomic History   Marital status: Married    Spouse name: Not on file   Number of children: Not on file   Years of education: Not on file   Highest education level: Some college, no degree  Occupational History   Occupation: Retired    Associate Professor: RETIRED  Tobacco Use   Smoking status: Never   Smokeless tobacco: Never   Tobacco comments:    never used tobacco  Vaping Use   Vaping status: Never Used  Substance and Sexual Activity   Alcohol use: Not Currently    Comment: occasional    Drug use: No   Sexual activity: Not on file  Other Topics Concern   Not on file  Social History Narrative   Married, 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  Hobbies: travels fair amount has timeshare and also travels more than that, enjoys Presenter, broadcasting, home repair      Physician roster   Princella Nida - gastroenterology in past   Maude Crease - hematology (released around 2016)   Dr. Cam (prior [place]) - urology   Dr. Cleatus - Ophthalmology   Dr. Shona- dermatology   Social Drivers of Health   Financial Resource Strain: Low Risk  (12/30/2023)   Overall Financial Resource Strain (CARDIA)    Difficulty of Paying Living Expenses: Not hard at all  Food Insecurity: No Food Insecurity (12/30/2023)   Hunger Vital Sign    Worried About Running Out of Food in the Last Year: Never true    Ran Out of Food in the Last Year: Never true  Transportation Needs: No Transportation Needs (12/30/2023)   PRAPARE - Administrator, Civil Service (Medical): No    Lack of Transportation (Non-Medical): No  Physical Activity:  Inactive (12/30/2023)   Exercise Vital Sign    Days of Exercise per Week: 0 days    Minutes of Exercise per Session: Not on file  Stress: No Stress Concern Present (12/30/2023)   [employer] of Occupational Health - Occupational Stress Questionnaire    Feeling of Stress: Not at all  Social Connections: Socially Integrated (12/30/2023)   Social Connection and Isolation Panel    Frequency of Communication with Friends and Family: More than three times a week    Frequency of Social Gatherings with Friends and Family: Twice a week    Attends Religious Services: More than 4 times per year    Active Member of [employer] or Organizations: Yes    Attends Engineer, structural: More than 4 times per year    Marital Status: Married  Catering manager Violence: Not At Risk (11/14/2021)   Humiliation, Afraid, Rape, and Kick questionnaire    Fear of Current or Ex-Partner: No    Emotionally Abused: No    Physically Abused: No    Sexually Abused: No    FAMILY HISTORY: Family History  Problem Relation Age of Onset   Colon cancer Mother 50   Cancer Mother    Early death Mother    Colon cancer Father 65   Cancer Father    Early death Father    Obesity Father    Prostate cancer Paternal Grandfather    Esophageal cancer Paternal Grandfather    Cancer Paternal Grandfather    Vision loss Paternal Grandfather    Stomach cancer Neg Hx    Rectal cancer Neg Hx     ALLERGIES:  has no known allergies.  MEDICATIONS:  Current Outpatient Medications  Medication Sig Dispense Refill   cholecalciferol (VITAMIN D3) 25 MCG (1000 UNIT) tablet Take 2,000 Units by mouth daily.     Multiple Vitamins-Minerals (MULTIVITAMIN WITH MINERALS) tablet Take 1 tablet by mouth daily.     omeprazole  (PRILOSEC) 20 MG capsule TAKE 1 CAPSULE EVERY DAY 90 capsule 3   OVER THE COUNTER MEDICATION Iron     rosuvastatin  (CRESTOR ) 10 MG tablet TAKE 1 TABLET EVERY DAY 90 tablet 3   warfarin (COUMADIN ) 5 MG tablet TAKE 1  TABLET DAILY OR AS DIRECTED BY ANTICOAGULATION CLINIC 90 tablet 3   co-enzyme Q-10 50 MG capsule Take 200 mg by mouth daily.     predniSONE  (DELTASONE ) 20 MG tablet Take 1 tablet (20 mg total) by mouth daily with breakfast. (Patient not taking: Reported on 01/07/2024) 7 tablet 0   No  current facility-administered medications for this visit.     PHYSICAL EXAMINATION: ECOG PERFORMANCE STATUS: 0 - Asymptomatic  Vitals:   01/07/24 1131  BP: 125/68  Pulse: 80  Temp: 98.1 F (36.7 C)  SpO2: 94%   Filed Weights   01/07/24 1131  Weight: 261 lb 4.8 oz (118.5 kg)    GENERAL:alert, no distress and comfortable NECK: supple, thyroid  normal size, non-tender, without nodularity LYMPH:  no palpable lymphadenopathy in the cervical, axillary  LUNGS: clear to auscultation and percussion with normal breathing effort HEART: regular rate & rhythm and no murmurs and no lower extremity edema ABDOMEN:abdomen soft, non-tender and normal bowel sounds Musculoskeletal:no cyanosis of digits and no clubbing  PSYCH: alert & oriented x 3 with fluent speech NEURO: no focal motor/sensory deficits Purpuric rash noted with crusting BLE.  LABORATORY DATA:  I have reviewed the data as listed Lab Results  Component Value Date   WBC 1.0 Repeated and verified X2. (LL) 01/04/2024   HGB 12.5 (L) 01/04/2024   HCT 36.9 (L) 01/04/2024   MCV 82.5 01/04/2024   PLT 419.0 (H) 01/04/2024     Chemistry      Component Value Date/Time   NA 138 12/30/2023 1452   NA 139 06/16/2014 1206   K 3.9 12/30/2023 1452   K 4.4 06/16/2014 1206   CL 103 12/30/2023 1452   CO2 27 12/30/2023 1452   CO2 24 06/16/2014 1206   BUN 11 12/30/2023 1452   BUN 17.2 06/16/2014 1206   CREATININE 0.80 12/30/2023 1452   CREATININE 0.98 02/29/2020 1140   CREATININE 0.9 06/16/2014 1206      Component Value Date/Time   CALCIUM  9.0 12/30/2023 1452   CALCIUM  8.9 06/16/2014 1206   ALKPHOS 67 12/30/2023 1452   ALKPHOS 74 06/16/2014 1206   AST  22 12/30/2023 1452   AST 19 06/16/2014 1206   ALT 36 12/30/2023 1452   ALT 22 06/16/2014 1206   BILITOT 1.1 12/30/2023 1452   BILITOT 0.92 06/16/2014 1206       RADIOGRAPHIC STUDIES: I have personally reviewed the radiological images as listed and agreed with the findings in the report. DG Chest 2 View Result Date: 01/06/2024 CLINICAL DATA:  Fever. Vasculitis. Clinical concern for lung infection. EXAM: CHEST - 2 VIEW COMPARISON:  02/06/2023 and chest CTA dated 07/16/2020. FINDINGS: Normal sized heart. Tortuous aorta. The right lateral pleural-based lipoma seen on the previous CTA is stable. Clear lungs with normal vascularity. Stable clustered calcified granulomata in the right lower lung zone. Thoracic spine degenerative changes. IMPRESSION: No acute abnormality. Electronically Signed   By: Elspeth Bathe M.D.   On: 01/06/2024 15:45    All questions were answered. The patient knows to call the clinic with any problems, questions or concerns. I spent 40 minutes in the care of this patient including H and P, review of records, counseling and coordination of care.     Amber Stalls, MD 01/07/2024 12:02 PM

## 2024-01-08 ENCOUNTER — Ambulatory Visit (INDEPENDENT_AMBULATORY_CARE_PROVIDER_SITE_OTHER)

## 2024-01-08 DIAGNOSIS — Z7901 Long term (current) use of anticoagulants: Secondary | ICD-10-CM

## 2024-01-08 LAB — ANCA TITERS
Atypical P-ANCA titer: <1:20 {titer}
C-ANCA: <1:20 {titer}
P-ANCA: <1:20 {titer}

## 2024-01-08 LAB — ANTINUCLEAR ANTIBODIES, IFA: ANA Ab, IFA: NEGATIVE

## 2024-01-08 LAB — POCT INR: INR: 2.5 (ref 2.0–3.0)

## 2024-01-08 NOTE — Patient Instructions (Addendum)
 Pre visit review using our clinic review tool, if applicable. No additional management support is needed unless otherwise documented below in the visit note.  Continue 1 tablet daily except take 1/2 tablet on Sundays and Wednesdays. Recheck in 2 weeks.

## 2024-01-08 NOTE — Progress Notes (Signed)
 Pt had bilateral lower leg rash start on 7/27 and had visit with dermatology. They placed pt on an azithromycin . Pt saws Corean Comment, NP, on 7/30 and was prescribed doxycycline  x 7 days and prednisone , 20 mg x 7 days. Azithromycin  was stopped by Corean. This will cause increase in INR. Pt finished abx and steroid on 8/6. Continue 1 tablet daily except take 1/2 tablet on Sundays and Wednesdays. Recheck in 2 weeks.

## 2024-01-14 ENCOUNTER — Inpatient Hospital Stay

## 2024-01-14 ENCOUNTER — Inpatient Hospital Stay: Admitting: Hematology and Oncology

## 2024-01-14 VITALS — BP 122/73 | HR 80 | Temp 97.6°F | Resp 15 | Wt 254.3 lb

## 2024-01-14 DIAGNOSIS — R21 Rash and other nonspecific skin eruption: Secondary | ICD-10-CM | POA: Diagnosis not present

## 2024-01-14 DIAGNOSIS — Z86718 Personal history of other venous thrombosis and embolism: Secondary | ICD-10-CM | POA: Diagnosis not present

## 2024-01-14 DIAGNOSIS — D709 Neutropenia, unspecified: Secondary | ICD-10-CM

## 2024-01-14 DIAGNOSIS — E538 Deficiency of other specified B group vitamins: Secondary | ICD-10-CM | POA: Diagnosis not present

## 2024-01-14 DIAGNOSIS — Z7901 Long term (current) use of anticoagulants: Secondary | ICD-10-CM | POA: Diagnosis not present

## 2024-01-14 DIAGNOSIS — E785 Hyperlipidemia, unspecified: Secondary | ICD-10-CM | POA: Diagnosis not present

## 2024-01-14 DIAGNOSIS — K7689 Other specified diseases of liver: Secondary | ICD-10-CM | POA: Diagnosis not present

## 2024-01-14 DIAGNOSIS — R600 Localized edema: Secondary | ICD-10-CM

## 2024-01-14 DIAGNOSIS — Z8546 Personal history of malignant neoplasm of prostate: Secondary | ICD-10-CM | POA: Diagnosis not present

## 2024-01-14 DIAGNOSIS — I776 Arteritis, unspecified: Secondary | ICD-10-CM | POA: Diagnosis not present

## 2024-01-14 LAB — CBC WITH DIFFERENTIAL/PLATELET
Abs Immature Granulocytes: 0.05 K/uL (ref 0.00–0.07)
Basophils Absolute: 0.1 K/uL (ref 0.0–0.1)
Basophils Relative: 1 %
Eosinophils Absolute: 0 K/uL (ref 0.0–0.5)
Eosinophils Relative: 1 %
HCT: 42.9 % (ref 39.0–52.0)
Hemoglobin: 14.6 g/dL (ref 13.0–17.0)
Immature Granulocytes: 1 %
Lymphocytes Relative: 35 %
Lymphs Abs: 2.2 K/uL (ref 0.7–4.0)
MCH: 28.3 pg (ref 26.0–34.0)
MCHC: 34 g/dL (ref 30.0–36.0)
MCV: 83.1 fL (ref 80.0–100.0)
Monocytes Absolute: 1 K/uL (ref 0.1–1.0)
Monocytes Relative: 16 %
Neutro Abs: 3 K/uL (ref 1.7–7.7)
Neutrophils Relative %: 46 %
Platelets: 256 K/uL (ref 150–400)
RBC: 5.16 MIL/uL (ref 4.22–5.81)
RDW: 14.9 % (ref 11.5–15.5)
WBC: 6.4 K/uL (ref 4.0–10.5)
nRBC: 0 % (ref 0.0–0.2)

## 2024-01-14 LAB — TSH: TSH: 1.82 u[IU]/mL (ref 0.350–4.500)

## 2024-01-14 NOTE — Progress Notes (Signed)
 [facility] CONSULT NOTE  Patient Care Team: Katrinka Garnette KIDD, MD as PCP - General (Family Medicine) Cam Morene ORN, MD as Consulting Physician (Urology) Nicholaus Sherlean CROME, [facility] (Inactive) as Pharmacist (Pharmacist)  CHIEF COMPLAINTS/PURPOSE OF CONSULTATION:  Severe leukopenia.  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 Soon after he started abx, he was shown to have severe neutropenia, hence he came back to see us . At the time of our initial visit, he was starting to improve and just completed the abx. WBC started recovering and on this visit is back in the normal range. At this time, we didn't recommend any additional investigation He could try to avoid the abx that he tried in the future If the vasculitis returns, he should see rheumatology  Vitamin B12 insufficiency Vitamin B12 levels are slightly low, potentially affecting future blood counts. - Recommend OTC Vitamin B12 supplementation, 1000 mcg daily.  FU in 8 weeks with labs.  HISTORY OF PRESENTING ILLNESS:  Discussed the use of AI scribe software for clinical note transcription with the patient, who gave verbal consent to proceed.  History of Present Illness  Nicholas Owen is a 78 year old male who presents for follow-up of his blood count and B12 levels.  He is currently experiencing a healing rash and is monitoring his diet to lose weight. He notes a good appetite and some weight loss, which he attributes to a possible fluid shift. Overall he feels much better.   He has a longstanding blood blister on his lip, which has been present for many years and is not causing concern.  All other systems were reviewed with the patient and are negative.  MEDICAL HISTORY:  Past Medical History:  Diagnosis Date   Allergy     Arthralgia of left knee 06/15/2020   Asbestos exposure    Basal cell carcinoma    skin of left ear   BPH (benign prostatic hyperplasia)    Cancer (HCC)    basal cell CA (skin)   Carcinoma of prostate (HCC) 12/23/2023   Total Prostatectomy Dr. Alline 2013. PSA testing through Dr. Alline; still with residual disease     Chronic nonseasonal allergic rhinitis due to pollen 12/23/2023   Clotting disorder (HCC) 2021   Colitis 01/2011   Colon polyp    Deviated nasal septum    hx. 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 Prostate cancer after bx.   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 He does not want to consider surgical intervention for now. Continue to use pads.      SURGICAL HISTORY:  Past Surgical History:  Procedure Laterality Date   APPENDECTOMY  2022   COLONOSCOPY     CYSTOSCOPY  02/16/2012   Procedure: CYSTOSCOPY FLEXIBLE;  Surgeon: Alm GORMAN Fragmin, MD;  Location: [facility];  Service: Urology;  Laterality: N/A;  FLEX CYSTO WITH REMOVAL OF STAPLES    INGUINAL HERNIA REPAIR  01/31/1989   double   KNEE SURGERY  06/02/2005   right knee scope   LAPAROSCOPIC APPENDECTOMY N/A 09/15/2019   Procedure: APPENDECTOMY LAPAROSCOPIC;   Surgeon: Tanda Locus, MD;  Location: [facility];  Service: General;  Laterality: N/A;   lipoma removals  01/31/1989   multiple and multiple remains now   LYMPH NODE DISSECTION  12/31/2011   Procedure: LYMPH NODE DISSECTION;  Surgeon: Alm GORMAN Fragmin, MD;  Location: [facility];  Service: Urology;  Laterality: Bilateral;  Bilateral   POLYPECTOMY     ROBOT ASSISTED LAPAROSCOPIC RADICAL PROSTATECTOMY  12/31/2011   Procedure: ROBOTIC ASSISTED LAPAROSCOPIC RADICAL PROSTATECTOMY;  Surgeon: Alm GORMAN Fragmin, MD;  Location: [facility];  Service: Urology;  Laterality: N/A;       TONSILLECTOMY      SOCIAL HISTORY: Social History   Socioeconomic History   Marital status: Married    Spouse name: Not on file   Number of children: Not on file   Years of education: Not on file   Highest education level: Some college, no degree  Occupational History   Occupation: Retired    Associate Professor: RETIRED  Tobacco Use   Smoking status: Never   Smokeless tobacco: Never   Tobacco comments:    never used tobacco  Vaping Use   Vaping status: Never Used  Substance and Sexual Activity   Alcohol use: Not Currently    Comment: occasional    Drug use: No   Sexual activity: Not on file  Other Topics Concern   Not on file  Social History Narrative   Married, 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       Hobbies: travels fair amount has timeshare and also travels more than that, enjoys Presenter, broadcasting, home repair      Physician roster   Princella Nida - gastroenterology in past   Maude Crease - hematology (released around 2016)   Dr. Cam (prior [place]) - urology   Dr. Cleatus - Ophthalmology   Dr. Shona- dermatology   Social Drivers of Health   Financial Resource Strain: Low Risk  (12/30/2023)   Overall Financial Resource Strain (CARDIA)    Difficulty of Paying Living Expenses: Not hard at all  Food Insecurity: No Food Insecurity (12/30/2023)   Hunger Vital Sign     Worried About Running Out of Food in the Last Year: Never true    Ran Out of Food in the Last Year: Never true  Transportation Needs: No Transportation Needs (12/30/2023)   PRAPARE - Administrator, Civil Service (Medical): No    Lack of Transportation (Non-Medical): No  Physical Activity: Inactive (12/30/2023)   Exercise Vital Sign    Days of Exercise per Week: 0 days    Minutes of Exercise per Session: Not on file  Stress: No Stress Concern Present (12/30/2023)   [employer] of Occupational Health - Occupational Stress Questionnaire    Feeling of Stress: Not at all  Social Connections: Socially Integrated (12/30/2023)   Social Connection and Isolation Panel    Frequency of Communication with Friends and Family: More than three times a week  Frequency of Social Gatherings with Friends and Family: Twice a week    Attends Religious Services: More than 4 times per year    Active Member of [employer] or Organizations: Yes    Attends Engineer, structural: More than 4 times per year    Marital Status: Married  Catering manager Violence: Not At Risk (11/14/2021)   Humiliation, Afraid, Rape, and Kick questionnaire    Fear of Current or Ex-Partner: No    Emotionally Abused: No    Physically Abused: No    Sexually Abused: No    FAMILY HISTORY: Family History  Problem Relation Age of Onset   Colon cancer Mother 72   Cancer Mother    Early death Mother    Colon cancer Father 55   Cancer Father    Early death Father    Obesity Father    Prostate cancer Paternal Grandfather    Esophageal cancer Paternal Grandfather    Cancer Paternal Grandfather    Vision loss Paternal Grandfather    Stomach cancer Neg Hx    Rectal cancer Neg Hx     ALLERGIES:  has no known allergies.  MEDICATIONS:  Current Outpatient Medications  Medication Sig Dispense Refill   cholecalciferol (VITAMIN D3) 25 MCG (1000 UNIT) tablet Take 2,000 Units by mouth daily.     co-enzyme Q-10 50 MG  capsule Take 200 mg by mouth daily.     Multiple Vitamins-Minerals (MULTIVITAMIN WITH MINERALS) tablet Take 1 tablet by mouth daily.     omeprazole  (PRILOSEC) 20 MG capsule TAKE 1 CAPSULE EVERY DAY 90 capsule 3   OVER THE COUNTER MEDICATION Iron     rosuvastatin  (CRESTOR ) 10 MG tablet TAKE 1 TABLET EVERY DAY 90 tablet 3   warfarin (COUMADIN ) 5 MG tablet TAKE 1 TABLET DAILY OR AS DIRECTED BY ANTICOAGULATION CLINIC 90 tablet 3   No current facility-administered medications for this visit.     PHYSICAL EXAMINATION: ECOG PERFORMANCE STATUS: 0 - Asymptomatic  Vitals:   01/14/24 1148  BP: 122/73  Pulse: 80  Resp: 15  Temp: 97.6 F (36.4 C)  SpO2: 94%   Filed Weights   01/14/24 1148  Weight: 254 lb 4.8 oz (115.3 kg)    GENERAL:alert, no distress and comfortable NECK: supple, thyroid  normal size, non-tender, without nodularity LYMPH:  no palpable lymphadenopathy in the cervical, axillary  LUNGS: clear to auscultation and percussion with normal breathing effort HEART: regular rate & rhythm and no murmurs and no lower extremity edema ABDOMEN:abdomen soft, non-tender and normal bowel sounds Musculoskeletal:no cyanosis of digits and no clubbing  PSYCH: alert & oriented x 3 with fluent speech NEURO: no focal motor/sensory deficits Purpuric rash noted with crusting BLE.  LABORATORY DATA:  I have reviewed the data as listed Lab Results  Component Value Date   WBC 2.3 (L) 01/07/2024   HGB 13.5 01/07/2024   HCT 39.4 01/07/2024   MCV 83.1 01/07/2024   PLT 313 01/07/2024     Chemistry      Component Value Date/Time   NA 139 01/07/2024 1223   NA 139 06/16/2014 1206   K 3.9 01/07/2024 1223   K 4.4 06/16/2014 1206   CL 104 01/07/2024 1223   CO2 30 01/07/2024 1223   CO2 24 06/16/2014 1206   BUN 16 01/07/2024 1223   BUN 17.2 06/16/2014 1206   CREATININE 0.89 01/07/2024 1223   CREATININE 0.98 02/29/2020 1140   CREATININE 0.9 06/16/2014 1206      Component Value Date/Time  CALCIUM  9.1 01/07/2024 1223   CALCIUM  8.9 06/16/2014 1206   ALKPHOS 59 01/07/2024 1223   ALKPHOS 74 06/16/2014 1206   AST 17 01/07/2024 1223   AST 19 06/16/2014 1206   ALT 31 01/07/2024 1223   ALT 22 06/16/2014 1206   BILITOT 0.9 01/07/2024 1223   BILITOT 0.92 06/16/2014 1206       RADIOGRAPHIC STUDIES: I have personally reviewed the radiological images as listed and agreed with the findings in the report. DG Chest 2 View Result Date: 01/06/2024 CLINICAL DATA:  Fever. Vasculitis. Clinical concern for lung infection. EXAM: CHEST - 2 VIEW COMPARISON:  02/06/2023 and chest CTA dated 07/16/2020. FINDINGS: Normal sized heart. Tortuous aorta. The right lateral pleural-based lipoma seen on the previous CTA is stable. Clear lungs with normal vascularity. Stable clustered calcified granulomata in the right lower lung zone. Thoracic spine degenerative changes. IMPRESSION: No acute abnormality. Electronically Signed   By: Elspeth Bathe M.D.   On: 01/06/2024 15:45    All questions were answered. The patient knows to call the clinic with any problems, questions or concerns. I spent 30 minutes in the care of this patient including H and P, review of records, counseling and coordination of care.     Amber Stalls, MD 01/14/2024 12:11 PM

## 2024-01-22 ENCOUNTER — Encounter: Payer: Self-pay | Admitting: Internal Medicine

## 2024-01-22 ENCOUNTER — Ambulatory Visit (INDEPENDENT_AMBULATORY_CARE_PROVIDER_SITE_OTHER)

## 2024-01-22 ENCOUNTER — Ambulatory Visit: Admitting: Internal Medicine

## 2024-01-22 VITALS — BP 140/90 | HR 65 | Temp 98.2°F | Ht 72.0 in | Wt 256.4 lb

## 2024-01-22 DIAGNOSIS — Z7901 Long term (current) use of anticoagulants: Secondary | ICD-10-CM

## 2024-01-22 DIAGNOSIS — R030 Elevated blood-pressure reading, without diagnosis of hypertension: Secondary | ICD-10-CM | POA: Diagnosis not present

## 2024-01-22 DIAGNOSIS — R739 Hyperglycemia, unspecified: Secondary | ICD-10-CM

## 2024-01-22 DIAGNOSIS — L03115 Cellulitis of right lower limb: Secondary | ICD-10-CM

## 2024-01-22 LAB — POCT INR: INR: 2.3 (ref 2.0–3.0)

## 2024-01-22 MED ORDER — AZITHROMYCIN 250 MG PO TABS: ORAL_TABLET | ORAL | 1 refills | Status: AC

## 2024-01-22 NOTE — Patient Instructions (Signed)
Please take all new medication as prescribed  - the zpack ? ?Please continue all other medications as before, and refills have been done if requested. ? ?Please have the pharmacy call with any other refills you may need. ? ?Please keep your appointments with your specialists as you may have planned ? ? ? ? ? ? ?

## 2024-01-22 NOTE — Assessment & Plan Note (Signed)
 Mild, no hx of mrsa, but recent prednisone , now for zpack asd,  to f/u any worsening symptoms or concerns

## 2024-01-22 NOTE — Assessment & Plan Note (Signed)
 Lab Results  Component Value Date   HGBA1C 6.3 06/19/2023   Stable, pt to continue current medical treatment  - diet, wt control

## 2024-01-22 NOTE — Progress Notes (Signed)
 Review of pt's OV with provider today revealed pt was prescribed a z-pack. This can cause an increase in INR.  Contacted pt and advised he will need INR check in 1 week. Pt agreed and was scheduled. Advised to watch for s/s of bleeding or abnormal bruising. Advised if any s/s to go to ER. Pt verbalized understanding.

## 2024-01-22 NOTE — Patient Instructions (Addendum)
 Pre visit review using our clinic review tool, if applicable. No additional management support is needed unless otherwise documented below in the visit note.  Continue 1 tablet daily except take 1/2 tablet on Sundays and Wednesdays. Recheck in 2 weeks.

## 2024-01-22 NOTE — Progress Notes (Signed)
 Patient ID: Nicholas Owen, male   DOB: 11-25-1945, 78 y.o.   MRN: [id number]        Chief Complaint: follow up right leg cellulitis,        HPI:  Nicholas Owen is a 78 y.o. male here with recent LE vasculitis requiring prednisone  now completed, seen per derm with biopsy to right upper lateral leg, and second to right mid lateral leg.  The upper lesion is scabbed and no redness, swelling, but the wife at home is concerned that the redness, pain, swelling at the lower leg biopsy site, though no drainage, fever, chills, red streaks,  Pt denies chest pain, increased sob or doe, wheezing, orthopnea, PND, increased LE swelling, palpitations, dizziness or syncope.   Pt denies polydipsia, polyuria, or new focal neuro s/s.  Did have possible doxycycline  with leukopenia,   Recent cbc aug 14 back to baseline normal.          Wt Readings from Last 3 Encounters:  01/22/24 256 lb 6.4 oz (116.3 kg)  01/14/24 254 lb 4.8 oz (115.3 kg)  01/07/24 261 lb 4.8 oz (118.5 kg)   BP Readings from Last 3 Encounters:  01/22/24 (!) 140/90  01/14/24 122/73  01/07/24 125/68         Past Medical History:  Diagnosis Date   Allergy    Arthralgia of left knee 06/15/2020   Asbestos exposure    Basal cell carcinoma    skin of left ear   BPH (benign prostatic hyperplasia)    Cancer (HCC)    basal cell CA (skin)   Carcinoma of prostate (HCC) 12/23/2023   Total Prostatectomy Dr. Alline 2013. PSA testing through Dr. Alline; still with residual disease     Chronic nonseasonal allergic rhinitis due to pollen 12/23/2023   Clotting disorder (HCC) 2021   Colitis 01/2011   Colon polyp    Deviated nasal septum    hx. 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 Prostate cancer after bx.   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 He does not want to consider surgical intervention for now. Continue to use pads.     Past Surgical History:  Procedure Laterality Date   APPENDECTOMY  2022   COLONOSCOPY     CYSTOSCOPY  02/16/2012   Procedure: CYSTOSCOPY FLEXIBLE;  Surgeon: Alm GORMAN Alline, MD;  [facility]: [facility];  Service: Urology;  Laterality: N/A;  FLEX CYSTO WITH REMOVAL OF STAPLES    INGUINAL HERNIA REPAIR  01/31/1989   double   KNEE SURGERY  06/02/2005   right knee scope   LAPAROSCOPIC APPENDECTOMY N/A 09/15/2019   Procedure: APPENDECTOMY LAPAROSCOPIC;  Surgeon: Tanda Locus, MD;  Location: [facility];  Service: General;  Laterality: N/A;   lipoma removals  01/31/1989   multiple and multiple remains now   LYMPH NODE DISSECTION  12/31/2011   Procedure: LYMPH NODE DISSECTION;  Surgeon: Alm GORMAN Fragmin, MD;  Location: [facility];  Service: Urology;  Laterality: Bilateral;  Bilateral   POLYPECTOMY     ROBOT ASSISTED LAPAROSCOPIC RADICAL PROSTATECTOMY  12/31/2011   Procedure: ROBOTIC ASSISTED LAPAROSCOPIC RADICAL PROSTATECTOMY;  Surgeon: Alm GORMAN Fragmin, MD;  Location: [facility];  Service: Urology;  Laterality: N/A;       TONSILLECTOMY      reports that he has never smoked. He has never used smokeless tobacco. He reports that he does not currently use alcohol. He reports that he does not use drugs. family history includes Cancer in his father, mother, and  paternal grandfather; Colon cancer (age of onset: 32) in his father; Colon cancer (age of onset: 7) in his mother; Early death in his father and mother; Esophageal cancer in his paternal grandfather; Obesity in his father; Prostate cancer in his paternal grandfather; Vision loss in his paternal grandfather. Allergies  Allergen Reactions   Doxycycline  Other (See Comments)    Leukopenia per hematology   Current Outpatient Medications on File Prior to Visit  Medication Sig Dispense Refill   cholecalciferol (VITAMIN D3) 25 MCG (1000 UNIT) tablet Take 2,000 Units by mouth daily.     co-enzyme Q-10 50 MG capsule Take 200 mg by mouth daily.     Multiple Vitamins-Minerals (MULTIVITAMIN WITH MINERALS) tablet Take 1 tablet by mouth daily.     omeprazole  (PRILOSEC) 20 MG capsule TAKE 1 CAPSULE EVERY DAY 90 capsule 3   rosuvastatin  (CRESTOR ) 10 MG tablet TAKE 1 TABLET EVERY DAY 90 tablet 3   warfarin (COUMADIN ) 5 MG tablet TAKE 1 TABLET DAILY OR AS DIRECTED BY ANTICOAGULATION CLINIC 90 tablet 3   OVER THE COUNTER MEDICATION Iron     No current facility-administered medications on file prior to visit.        ROS:  All others reviewed and negative.  Objective        PE:  BP (!) 140/90   Pulse 65   Temp 98.2 F (36.8 C) (Oral)   Ht 6' (1.829 m)   Wt 256 lb 6.4 oz (116.3 kg)   SpO2 93%   BMI 34.77 kg/m                 Constitutional: Pt appears in NAD               HENT: Head: NCAT.                Right Ear: External ear normal.                 Left Ear: External ear normal.                Eyes: . Pupils are equal, round, and reactive to light. Conjunctivae and EOM are normal               Nose: without d/c or deformity               Neck: Neck supple. Gross normal ROM               Cardiovascular: Normal rate and regular rhythm.                 Pulmonary/Chest: Effort normal and breath sounds without rales or wheezing.  Abd:  Soft, NT, ND, + BS, no organomegaly                Neurological: Pt is alert. At baseline orientation, motor grossly intact               Skin: Skin is warm. LE edema - none, has numerous healing post inflammatory dark lesions from recent vasculitis, and right lower lateral leg with biopsy site with 2 cm surrounding mild tender erythema, no drainage               Psychiatric: Pt behavior is normal without agitation   Micro: none  Cardiac tracings I have personally interpreted today:  none  Pertinent Radiological findings (summarize): none   Lab Results  Component Value Date   WBC 6.4 01/14/2024   HGB 14.6 01/14/2024   HCT 42.9 01/14/2024   PLT 256 01/14/2024   GLUCOSE 97 01/07/2024   CHOL 137 06/19/2023   TRIG 119.0 06/19/2023   HDL 39.20 06/19/2023   LDLDIRECT 78 02/29/2020   LDLCALC 74 06/19/2023   ALT 31 01/07/2024   AST 17 01/07/2024   NA 139 01/07/2024   K 3.9 01/07/2024   CL 104 01/07/2024   CREATININE 0.89 01/07/2024   BUN 16 01/07/2024   CO2 30 01/07/2024   TSH 1.820 01/14/2024   PSA 0.021 10/15/2020   INR 2.3 01/22/2024   HGBA1C 6.3 06/19/2023   Assessment/Plan:  Nicholas Owen is a 78 y.o. White or Caucasian [1] male with  has a past medical history of Allergy, Arthralgia of left knee (06/15/2020), Asbestos exposure, Basal cell carcinoma, BPH (benign prostatic hyperplasia), Cancer (HCC), Carcinoma of prostate (HCC) (12/23/2023), Chronic nonseasonal allergic rhinitis due to pollen (12/23/2023), Clotting disorder (HCC) (2021), Colitis (01/2011), Colon polyp, Deviated nasal septum, Diverticulosis, DVT (deep venous thrombosis) (HCC), GERD (gastroesophageal reflux disease), Hyperlipidemia, Kidney stones, Lipoma (12/24/2011), Liver cyst (03/2010), Osteoarthritis of both knees (05/28/2022), Pain in joint of right knee (12/01/1999), Prediabetes (12/23/2023), Prostate cancer (HCC) (10/06/2011), Prostate cancer (HCC) (12/24/2011), Saphenous vein thrombophlebitis (12/23/2023), SVT (supraventricular tachycardia) (HCC)  (12/23/2023), Ulcerative colitis (HCC), Ureterolithiasis (12/23/2023), and Urinary incontinence (12/23/2023).  Cellulitis of right leg Mild, no hx of mrsa, but recent prednisone , now for zpack asd,  to f/u any worsening symptoms or concerns    Hyperglycemia Lab Results  Component Value Date   HGBA1C 6.3 06/19/2023   Stable, pt to continue current medical treatment  - diet, wt control   Elevated blood-pressure reading without diagnosis of hypertension Mild today, likely reactive, pt states is controlled at home, declines need for any change today  Followup: Return if symptoms worsen or fail to improve.  Lynwood Rush, MD 01/22/2024 1:51 PM [facility]

## 2024-01-22 NOTE — Progress Notes (Signed)
 Pt also has an OV with a provider today. Pt concern for infection at two biopsy sites. Advised if prescribed any abx to contact the coumadin  clinic. Pt verbalized understanding. Continue 1 tablet daily except take 1/2 tablet on Sundays and Wednesdays. Recheck in 2 weeks.

## 2024-01-22 NOTE — Assessment & Plan Note (Signed)
 Mild today, likely reactive, pt states is controlled at home, declines need for any change today

## 2024-01-29 ENCOUNTER — Ambulatory Visit

## 2024-01-29 DIAGNOSIS — Z7901 Long term (current) use of anticoagulants: Secondary | ICD-10-CM | POA: Diagnosis not present

## 2024-01-29 LAB — POCT INR: INR: 2.1 (ref 2.0–3.0)

## 2024-01-29 NOTE — Patient Instructions (Addendum)
Pre visit review using our clinic review tool, if applicable. No additional management support is needed unless otherwise documented below in the visit note.  Continue 1 tablet daily except take 1/2 tablet on Sundays and Wednesdays . Recheck in 4 weeks.

## 2024-01-29 NOTE — Progress Notes (Signed)
 Pt was prescribed a z-pak on 8/22. This abx can increase INR.  Continue 1 tablet daily except take 1/2 tablet on Sundays and Wednesdays. Recheck in 4 weeks.

## 2024-02-19 ENCOUNTER — Ambulatory Visit

## 2024-02-26 ENCOUNTER — Ambulatory Visit

## 2024-02-26 DIAGNOSIS — Z7901 Long term (current) use of anticoagulants: Secondary | ICD-10-CM | POA: Diagnosis not present

## 2024-02-26 LAB — POCT INR: INR: 2.1 (ref 2.0–3.0)

## 2024-02-26 NOTE — Progress Notes (Signed)
Continue 1 tablet daily except take 1/2 tablet on Sundays and Wednesdays. Recheck in 6 weeks.

## 2024-02-26 NOTE — Patient Instructions (Addendum)
Pre visit review using our clinic review tool, if applicable. No additional management support is needed unless otherwise documented below in the visit note.  Continue 1 tablet daily except take 1/2 tablet on Sundays and Wednesdays. Recheck in 6 weeks.

## 2024-03-04 DIAGNOSIS — L570 Actinic keratosis: Secondary | ICD-10-CM | POA: Diagnosis not present

## 2024-03-04 DIAGNOSIS — C4441 Basal cell carcinoma of skin of scalp and neck: Secondary | ICD-10-CM | POA: Diagnosis not present

## 2024-03-04 DIAGNOSIS — X32XXXD Exposure to sunlight, subsequent encounter: Secondary | ICD-10-CM | POA: Diagnosis not present

## 2024-04-06 DIAGNOSIS — H10413 Chronic giant papillary conjunctivitis, bilateral: Secondary | ICD-10-CM | POA: Diagnosis not present

## 2024-04-08 ENCOUNTER — Ambulatory Visit (INDEPENDENT_AMBULATORY_CARE_PROVIDER_SITE_OTHER)

## 2024-04-08 DIAGNOSIS — Z7901 Long term (current) use of anticoagulants: Secondary | ICD-10-CM | POA: Diagnosis not present

## 2024-04-08 DIAGNOSIS — Z23 Encounter for immunization: Secondary | ICD-10-CM

## 2024-04-08 LAB — POCT INR: INR: 2.5 (ref 2.0–3.0)

## 2024-04-08 NOTE — Patient Instructions (Addendum)
Pre visit review using our clinic review tool, if applicable. No additional management support is needed unless otherwise documented below in the visit note.  Continue 1 tablet daily except take 1/2 tablet on Sundays and Wednesdays. Recheck in 6 weeks.

## 2024-04-08 NOTE — Progress Notes (Signed)
 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 Recheck in 6 weeks.  Pt requested high dose flu vaccine. Administered vaccine. Pt tolerated well.

## 2024-05-03 DIAGNOSIS — Z85828 Personal history of other malignant neoplasm of skin: Secondary | ICD-10-CM | POA: Diagnosis not present

## 2024-05-03 DIAGNOSIS — H61002 Unspecified perichondritis of left external ear: Secondary | ICD-10-CM | POA: Diagnosis not present

## 2024-05-03 DIAGNOSIS — Z08 Encounter for follow-up examination after completed treatment for malignant neoplasm: Secondary | ICD-10-CM | POA: Diagnosis not present

## 2024-05-20 ENCOUNTER — Ambulatory Visit

## 2024-05-20 DIAGNOSIS — Z7901 Long term (current) use of anticoagulants: Secondary | ICD-10-CM

## 2024-05-20 LAB — POCT INR: INR: 2.2 (ref 2.0–3.0)

## 2024-05-20 NOTE — Patient Instructions (Addendum)
Pre visit review using our clinic review tool, if applicable. No additional management support is needed unless otherwise documented below in the visit note.  Continue 1 tablet daily except take 1/2 tablet on Sundays and Wednesdays. Recheck in 6 weeks.

## 2024-05-20 NOTE — Progress Notes (Signed)
 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 Recheck in 6 weeks.

## 2024-06-20 ENCOUNTER — Encounter: Payer: Self-pay | Admitting: Family Medicine

## 2024-06-20 ENCOUNTER — Ambulatory Visit (INDEPENDENT_AMBULATORY_CARE_PROVIDER_SITE_OTHER): Payer: Medicare Other | Admitting: Family Medicine

## 2024-06-20 ENCOUNTER — Ambulatory Visit: Payer: Self-pay | Admitting: Family Medicine

## 2024-06-20 VITALS — BP 112/70 | HR 72 | Temp 98.1°F | Ht 72.0 in | Wt 261.4 lb

## 2024-06-20 DIAGNOSIS — R739 Hyperglycemia, unspecified: Secondary | ICD-10-CM

## 2024-06-20 DIAGNOSIS — Z0000 Encounter for general adult medical examination without abnormal findings: Secondary | ICD-10-CM

## 2024-06-20 DIAGNOSIS — Z131 Encounter for screening for diabetes mellitus: Secondary | ICD-10-CM | POA: Diagnosis not present

## 2024-06-20 DIAGNOSIS — E785 Hyperlipidemia, unspecified: Secondary | ICD-10-CM

## 2024-06-20 LAB — CBC WITH DIFFERENTIAL/PLATELET
Basophils Absolute: 0 K/uL (ref 0.0–0.1)
Basophils Relative: 0.6 % (ref 0.0–3.0)
Eosinophils Absolute: 0.4 K/uL (ref 0.0–0.7)
Eosinophils Relative: 6.7 % — ABNORMAL HIGH (ref 0.0–5.0)
HCT: 44.5 % (ref 39.0–52.0)
Hemoglobin: 15.3 g/dL (ref 13.0–17.0)
Lymphocytes Relative: 23 % (ref 12.0–46.0)
Lymphs Abs: 1.4 K/uL (ref 0.7–4.0)
MCHC: 34.3 g/dL (ref 30.0–36.0)
MCV: 85.2 fl (ref 78.0–100.0)
Monocytes Absolute: 0.5 K/uL (ref 0.1–1.0)
Monocytes Relative: 8.4 % (ref 3.0–12.0)
Neutro Abs: 3.7 K/uL (ref 1.4–7.7)
Neutrophils Relative %: 61.3 % (ref 43.0–77.0)
Platelets: 266 K/uL (ref 150.0–400.0)
RBC: 5.23 Mil/uL (ref 4.22–5.81)
RDW: 15.2 % (ref 11.5–15.5)
WBC: 6.1 K/uL (ref 4.0–10.5)

## 2024-06-20 LAB — COMPREHENSIVE METABOLIC PANEL WITH GFR
ALT: 22 U/L (ref 3–53)
AST: 21 U/L (ref 5–37)
Albumin: 4.3 g/dL (ref 3.5–5.2)
Alkaline Phosphatase: 58 U/L (ref 39–117)
BUN: 13 mg/dL (ref 6–23)
CO2: 25 meq/L (ref 19–32)
Calcium: 9.2 mg/dL (ref 8.4–10.5)
Chloride: 105 meq/L (ref 96–112)
Creatinine, Ser: 0.87 mg/dL (ref 0.40–1.50)
GFR: 83.54 mL/min
Glucose, Bld: 106 mg/dL — ABNORMAL HIGH (ref 70–99)
Potassium: 4.2 meq/L (ref 3.5–5.1)
Sodium: 138 meq/L (ref 135–145)
Total Bilirubin: 1.1 mg/dL (ref 0.2–1.2)
Total Protein: 6.8 g/dL (ref 6.0–8.3)

## 2024-06-20 LAB — LIPID PANEL
Cholesterol: 119 mg/dL (ref 28–200)
HDL: 37 mg/dL — ABNORMAL LOW
LDL Cholesterol: 60 mg/dL (ref 10–99)
NonHDL: 82.25
Total CHOL/HDL Ratio: 3
Triglycerides: 112 mg/dL (ref 10.0–149.0)
VLDL: 22.4 mg/dL (ref 0.0–40.0)

## 2024-06-20 LAB — HEMOGLOBIN A1C: Hgb A1c MFr Bld: 6.1 % (ref 4.6–6.5)

## 2024-06-20 NOTE — Patient Instructions (Addendum)
 Check with pharmacy and let us  know if you had Shingrix  Tetanus, Diphtheria, and Pertussis (Tdap) at pharmacy  Prediabetes noted- lets work on healthy eating and regular exercise to bring this down  I'm concerned with ear not healing well and potential for cancer with cancer history in that area- please tell Dr. Shona I asked if Mohs would be an option for you  Recommended follow up: Return in about 1 year (around 06/20/2025) for physical or sooner if needed.Schedule b4 you leave.

## 2024-06-20 NOTE — Progress Notes (Signed)
 " Phone: [phone]   Subjective:  Patient presents today for their annual physical. Chief complaint-noted.   See problem oriented charting- ROS- full  review of systems was completed and negative  except for topics noted under acute/chronic concerns  The following were reviewed and entered/updated in epic: Past Medical History:  Diagnosis Date   Allergy    Arthralgia of left knee 06/15/2020   Asbestos exposure    Basal cell carcinoma    skin of left ear   BPH (benign prostatic hyperplasia)    Cancer (HCC)    basal cell CA (skin)   Carcinoma of prostate (HCC) 12/23/2023   Total Prostatectomy Dr. Alline 2013. PSA testing through Dr. Alline; still with residual disease     Chronic nonseasonal allergic rhinitis due to pollen 12/23/2023   Clotting disorder 2021   Colitis 01/2011   Colon polyp    Deviated nasal septum    hx. 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 Prostate cancer after bx.   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 He does not want to consider surgical intervention for now. Continue to use pads.     Patient Active Problem List   Diagnosis Date Noted   Lupus anticoagulant disorder 04/30/2015    Priority: High   History of DVT of  lower extremity 08/31/2012    Priority: High   Chronic pulmonary embolism (HCC) 04/13/2012    Priority: High   History of prostate cancer 05/14/2011    Priority: High   Diverticulitis 05/19/2018    Priority: Medium    Urinary incontinence 09/20/2014    Priority: Medium    Hyperglycemia 05/12/2012    Priority: Medium    Gastroesophageal reflux disease without esophagitis 10/04/2007    Priority: Medium    Mixed hyperlipidemia 03/08/2007    Priority: Medium    Basal cell carcinoma 12/23/2023    Priority: Low   Diverticulosis 03/22/2014    Priority: Low   Erectile dysfunction 03/22/2014    Priority: Low   History of colonic polyps 08/31/2012    Priority: Low   Obesity 10/08/2011    Priority: Low   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   Cellulitis of right leg 01/22/2024   Elevated blood-pressure reading without diagnosis of hypertension 01/22/2024   Primary hypercoagulable state 10/31/2020   Past Surgical History:  Procedure Laterality Date   APPENDECTOMY  2022   COLONOSCOPY     CYSTOSCOPY  02/16/2012   Procedure: CYSTOSCOPY FLEXIBLE;  Surgeon: Alm GORMAN Alline, MD;  Location:  [facility];  Service: Urology;  Laterality: N/A;  FLEX CYSTO WITH REMOVAL OF STAPLES    INGUINAL HERNIA REPAIR  01/31/1989   double   KNEE SURGERY  06/02/2005   right knee scope   LAPAROSCOPIC APPENDECTOMY N/A 09/15/2019   Procedure: APPENDECTOMY LAPAROSCOPIC;  Surgeon: Tanda Locus, MD;  Location: [facility];  Service: General;  Laterality: N/A;   lipoma removals  01/31/1989   multiple and multiple remains now   LYMPH NODE DISSECTION  12/31/2011   Procedure: LYMPH NODE DISSECTION;  Surgeon: Alm GORMAN Fragmin, MD;  Location: [facility];  Service: Urology;  Laterality: Bilateral;  Bilateral   POLYPECTOMY      ROBOT ASSISTED LAPAROSCOPIC RADICAL PROSTATECTOMY  12/31/2011   Procedure: ROBOTIC ASSISTED LAPAROSCOPIC RADICAL PROSTATECTOMY;  Surgeon: Alm GORMAN Fragmin, MD;  Location: [facility];  Service: Urology;  Laterality: N/A;       TONSILLECTOMY      Family History  Problem Relation Age of Onset   Colon cancer Mother 54   Cancer Mother    Early death Mother    Colon cancer Father 52   Cancer Father    Early death Father    Obesity Father    Prostate cancer Paternal Grandfather    Esophageal cancer Paternal Grandfather    Cancer Paternal Grandfather    Vision loss Paternal Grandfather    Stomach cancer Neg Hx    Rectal cancer Neg Hx     Medications- reviewed and updated Current Outpatient Medications  Medication Sig Dispense Refill   cholecalciferol (VITAMIN D3) 25 MCG (1000 UNIT) tablet Take 2,000 Units by mouth daily.     co-enzyme Q-10 50 MG capsule Take 200 mg by mouth daily.     Multiple Vitamins-Minerals (MULTIVITAMIN WITH MINERALS) tablet Take 1 tablet by mouth daily.     omeprazole  (PRILOSEC) 20 MG capsule TAKE 1 CAPSULE EVERY DAY 90 capsule 3   rosuvastatin  (CRESTOR ) 10 MG tablet TAKE 1 TABLET EVERY DAY 90 tablet 3   warfarin (COUMADIN ) 5 MG tablet TAKE 1 TABLET DAILY OR AS DIRECTED BY ANTICOAGULATION CLINIC 90 tablet 3   No current facility-administered medications for this visit.    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       Hobbies: travels fair amount has timeshare and also travels more than that, enjoys presenter, broadcasting, home repair      Physician roster   Princella Nida - gastroenterology in past   Maude Crease - hematology (released around 2016)   Dr. Cam (prior Lyndon) - urology   Dr. Cleatus - Ophthalmology   Dr. Shona- dermatology   Objective  Objective:  BP 112/70 (BP Location: Left Arm, Patient Position:  Sitting, Cuff Size: Normal)   Pulse 72   Temp 98.1 F (36.7 C) (Temporal)   Ht 6' (1.829 m)   Wt 261 lb 6.4 oz (118.6 kg)   SpO2 93%   BMI 35.45 kg/m  Gen: NAD, resting comfortably HEENT: Mucous membranes are moist. Oropharynx normal Neck: no thyromegaly CV: RRR no murmurs rubs or gallops Lungs: CTAB no crackles, wheeze, rhonchi Abdomen: soft/nontender/nondistended/normal bowel sounds. No rebound or guarding.  Ext: no edema Skin: warm, dry, tender nonheali Neuro: grossly normal, moves all extremities, PERRLA    Assessment and Plan  79 y.o. male presenting for annual physical.  Health Maintenance counseling: 1. Anticipatory guidance: Patient counseled regarding regular  dental exams -q6 months, eye exams - yearly,  avoiding smoking and second hand smoke , limiting alcohol to 2 beverages per day - rare social, no illicit drugs .   2. Risk factor reduction:  Advised patient of need for regular exercise and diet rich and fruits and vegetables to reduce risk of heart attack and stroke.  Exercise- gym 3 days a week sometimes 4. Feels has gained some muscle mass.  Diet/weight management-discussed GLP-1 agonist . Discussed caloric reduction and some options to do so- myfitnesspal or intermittent fasting Wt Readings from Last 3 Encounters:  06/20/24 261 lb 6.4 oz (118.6 kg)  01/22/24 256 lb 6.4 oz (116.3 kg)  01/14/24 254 lb 4.8 oz (115.3 kg)  3. Immunizations/screenings/ancillary studies- declines Shingrix, Tetanus, Diphtheria, and Pertussis (Tdap) at pharmacy recommended  Immunization History  Administered Date(s) Administered   Fluad Quad(high Dose 65+) 02/29/2020, 03/05/2021, 04/09/2022   Fluad Trivalent(High Dose 65+) 05/14/2023   INFLUENZA, HIGH DOSE SEASONAL PF 02/28/2015, 04/09/2016, 03/11/2017, 05/19/2018, 04/08/2024   Influenza Split 03/19/2011, 03/26/2012   Influenza Whole 03/15/2008, 02/22/2009, 02/25/2010   Influenza,inj,Quad PF,6+ Mos 03/18/2013, 03/07/2014    Influenza,inj,quad, With Preservative 03/16/2020   Pneumococcal Conjugate-13 04/30/2015   Pneumococcal Polysaccharide-23 09/12/2013   Pneumococcal-Unspecified 03/16/2020   RSV,unspecified 06/16/2023   Td 11/01/2003, 09/12/2013   Zoster, Live 05/14/2011   4. Prostate cancer screening- regular follow up with urology- yearly visits with very low psa 5. Colon cancer screening - 09/24/23 and was released form colonoscopy. Prostatectomy 2023 6. Skin cancer screening- still with Dr. Shona. advised regular sunscreen use. Denies worrisome, changing, or new skin lesions.  7. Smoking associated screening (lung cancer screening, AAA screen 65-75, UA)- never smoker 8. STD screening - only ative with wife   Status of chronic or acute concerns   # leukocytoclastic vasculitis -it appeared quickly/ rapidly and confirmed on biopsy-doxycycline  and prednisone  very helpful. Worked with stephanie and later Dr. Loretha- had some postinflammatory hyperpigmentation- also had leukopenia- thankfully this recovered and worked with hematology on this.   # Chronic pulmonary embolism/DVT in patient with lupus anticoagulant disorder # History of right heart strain due to submassive saddle pulmonary embolism previously-echocardiogram later showed resolution along with VQ scan S:Medication: Remains on long-term Coumadin  monitored by [facility]  -Patient requires Lovenox  bridge if he is to have procedure/surgery.  Previously seen by pulmonary who has released him. -per hematology During his last visit, I have recommended transition to coumadin  since DOAC's are inferior to coumadin  in APLS He was able to successfully transition to Coumadin  with his primary care physician A/P: ongoing long term coumadin - continue current medications and follow up with coumadin  clinic- controlled    # Hyperglycemia/insulin resistance/prediabetes-peak A1c of 6.2 in 2022 and 6.3 in 2025 S:  Medication: None  Lab Results  Component  Value Date   HGBA1C 6.3 06/19/2023   HGBA1C 5.9 12/02/2022   HGBA1C 6.2 03/05/2021    A/P: hopefully stable- update a1c today. Continue without meds for now    # GERD-typically controlled with omeprazole  20 mg- working well   #hyperlipidemia S: Medication:Rosuvastatin  10 mg daily along with coenzyme q10  Lab Results  Component Value Date   CHOL 137 06/19/2023   HDL 39.20 06/19/2023   LDLCALC 74 06/19/2023   LDLDIRECT 78 02/29/2020   TRIG 119.0 06/19/2023   CHOLHDL 3 06/19/2023  A/P: #s close to ideal goal with LDL at 75 last year- update again today   Recommended follow up: No follow-ups on file. Future Appointments  Date Time Provider Department Center  06/21/2024 10:30 AM [facility]   Lab/Order associations: fasting   ICD-10-CM   1. Preventative health care  Z00.00     2. Screening for diabetes mellitus  Z13.1     3. Hyperglycemia  R73.9     4. Hyperlipidemia, unspecified hyperlipidemia type  E78.5       No orders of the defined types were placed in this encounter.   Return precautions advised.  Garnette Lukes, MD      [1]  Allergies Allergen Reactions   Doxycycline  Other (See Comments)    Leukopenia per hematology   "

## 2024-06-21 ENCOUNTER — Ambulatory Visit

## 2024-06-21 DIAGNOSIS — Z7901 Long term (current) use of anticoagulants: Secondary | ICD-10-CM | POA: Diagnosis not present

## 2024-06-21 LAB — POCT INR: INR: 1.9 — AB (ref 2.0–3.0)

## 2024-06-21 NOTE — Progress Notes (Signed)
 Indication: PE (2013 & 2022), APS, DVT Increase dose today to take 1 1/2 tablets and then continue 1 tablet daily except take 1/2 tablet on Sundays and Wednesdays. Recheck in 4 weeks.

## 2024-06-21 NOTE — Patient Instructions (Addendum)
 Pre visit review using our clinic review tool, if applicable. No additional management support is needed unless otherwise documented below in the visit note.  Increase dose today to take 1 1/2 tablets and then continue 1 tablet daily except take 1/2 tablet on Sundays and Wednesdays. Recheck in 4 weeks.

## 2024-07-19 ENCOUNTER — Ambulatory Visit

## 2025-06-21 ENCOUNTER — Encounter: Admitting: Family Medicine
